# Patient Record
Sex: Male | Born: 1955 | Race: Black or African American | Hispanic: No | Smoking: Never smoker
Health system: Southern US, Community
[De-identification: ages and names within clinical notes are randomized; demographics above are authoritative.]

## PROBLEM LIST (undated history)

## (undated) DIAGNOSIS — E119 Type 2 diabetes mellitus without complications: Secondary | ICD-10-CM

## (undated) DIAGNOSIS — C9110 Chronic lymphocytic leukemia of B-cell type not having achieved remission: Secondary | ICD-10-CM

## (undated) DIAGNOSIS — F1011 Alcohol abuse, in remission: Secondary | ICD-10-CM

## (undated) DIAGNOSIS — H269 Unspecified cataract: Secondary | ICD-10-CM

## (undated) DIAGNOSIS — K7030 Alcoholic cirrhosis of liver without ascites: Secondary | ICD-10-CM

## (undated) DIAGNOSIS — E7800 Pure hypercholesterolemia, unspecified: Secondary | ICD-10-CM

## (undated) HISTORY — DX: Pure hypercholesterolemia, unspecified: E78.00

## (undated) HISTORY — DX: Alcohol abuse, in remission: F10.11

## (undated) HISTORY — DX: Type 2 diabetes mellitus without complications: E11.9

## (undated) HISTORY — DX: Alcoholic cirrhosis of liver without ascites: K70.30

## (undated) HISTORY — DX: Unspecified cataract: H26.9

---

## 2004-10-24 DIAGNOSIS — E139 Other specified diabetes mellitus without complications: Secondary | ICD-10-CM | POA: Diagnosis present

## 2004-10-24 DIAGNOSIS — I10 Essential (primary) hypertension: Secondary | ICD-10-CM

## 2004-10-24 HISTORY — DX: Essential (primary) hypertension: I10

## 2007-03-19 ENCOUNTER — Ambulatory Visit (HOSPITAL_COMMUNITY): Admission: RE | Admit: 2007-03-19 | Discharge: 2007-03-19 | Payer: Self-pay | Admitting: Gastroenterology

## 2013-12-06 ENCOUNTER — Other Ambulatory Visit: Payer: Self-pay | Admitting: Family Medicine

## 2013-12-06 ENCOUNTER — Ambulatory Visit
Admission: RE | Admit: 2013-12-06 | Discharge: 2013-12-06 | Disposition: A | Discharge status: Home / Self Care | Payer: PRIVATE HEALTH INSURANCE | Source: Ambulatory Visit | Attending: Family Medicine | Admitting: Family Medicine

## 2013-12-06 DIAGNOSIS — R05 Cough: Secondary | ICD-10-CM

## 2013-12-06 DIAGNOSIS — R059 Cough, unspecified: Secondary | ICD-10-CM

## 2016-05-13 ENCOUNTER — Ambulatory Visit (INDEPENDENT_AMBULATORY_CARE_PROVIDER_SITE_OTHER): Payer: PRIVATE HEALTH INSURANCE | Admitting: Podiatry

## 2016-05-13 ENCOUNTER — Encounter: Payer: Self-pay | Admitting: Podiatry

## 2016-05-13 DIAGNOSIS — M79674 Pain in right toe(s): Secondary | ICD-10-CM

## 2016-05-13 DIAGNOSIS — B351 Tinea unguium: Secondary | ICD-10-CM

## 2016-05-13 DIAGNOSIS — E0841 Diabetes mellitus due to underlying condition with diabetic mononeuropathy: Secondary | ICD-10-CM | POA: Diagnosis not present

## 2016-05-13 DIAGNOSIS — L84 Corns and callosities: Secondary | ICD-10-CM | POA: Diagnosis not present

## 2016-05-13 DIAGNOSIS — M79675 Pain in left toe(s): Secondary | ICD-10-CM

## 2016-05-13 NOTE — Progress Notes (Signed)
   Subjective:    Patient ID: Cody [name], male    DOB: [birth year], 60 y.o.   MRN: [id number]  HPI  This patient presents today with his sister in the treatment room and is requesting trimming of thickened and elongated toenails that are gradually becoming more uncomfortable over the past 4-6 months making shoe wearing walking uncomfortable. Apparently patient's had previous podiatric care, however, not in the last several years and there is been some self treatment by family members. Patient is a diabetic and denies history of foot ulceration, claudication, amputation   Review of Systems  HENT: Positive for sinus pressure.   Psychiatric/Behavioral:       BRAIN DAMAGE       Objective:   Physical Exam  Patient can respond to direct questioning  Vascular: Mild pitting edema ankles and feet bilaterally No calf pain or calf tenderness bilaterally DP and PT pulses 1/4 bilaterally Capillary reflex immediate bilaterally  Neurological: Sensation to 10 g monofilament wire intact 4/5 right 2/4 left Vibratory sensation nonreactive bilaterally Ankle reflex equal and reactive bilaterally  Dermatological: No open skin lesions bilaterally Plantar callus fifth left MPJ The toenails are elongated, brittle, deformed, discolored and tender to direct palpation 6-10  Musculoskeletal: Webbing toes 23 bilaterally Overlapping fifth left toe There is no restriction ankle, subtalar, midtarsal joints bilaterally      Assessment & Plan:   Assessment: Decrease pedal pulses bilaterally Decreased sensation bilaterally, peripheral neuropathy Diabetic Neglected symptomatic onychomycoses 6-10 Plantar callus 1  Plan: Reviewed the results of exam with patient and sister present to treatment room and recommend periodic nail debridement. The toenails 6-10 are debrided mechanically and electrically without any bleeding Debride plantar callus fifth MPJ left without any bleeding  Reappoint  3 months

## 2016-05-13 NOTE — Patient Instructions (Signed)
Diabetes and Foot Care Diabetes may cause you to have problems because of poor blood supply (circulation) to your feet and legs. This may cause the skin on your feet to become thinner, break easier, and heal more slowly. Your skin may become dry, and the skin may peel and crack. You may also have nerve damage in your legs and feet causing decreased feeling in them. You may not notice minor injuries to your feet that could lead to infections or more serious problems. Taking care of your feet is one of the most important things you can do for yourself.  HOME CARE INSTRUCTIONS  Wear shoes at all times, even in the house. Do not go barefoot. Bare feet are easily injured.  Check your feet daily for blisters, cuts, and redness. If you cannot see the bottom of your feet, use a mirror or ask someone for help.  Wash your feet with warm water (do not use hot water) and mild soap. Then pat your feet and the areas between your toes until they are completely dry. Do not soak your feet as this can dry your skin.  Apply a moisturizing lotion or petroleum jelly (that does not contain alcohol and is unscented) to the skin on your feet and to dry, brittle toenails. Do not apply lotion between your toes.  Trim your toenails straight across. Do not dig under them or around the cuticle. File the edges of your nails with an emery board or nail file.  Do not cut corns or calluses or try to remove them with medicine.  Wear clean socks or stockings every day. Make sure they are not too tight. Do not wear knee-high stockings since they may decrease blood flow to your legs.  Wear shoes that fit properly and have enough cushioning. To break in new shoes, wear them for just a few hours a day. This prevents you from injuring your feet. Always look in your shoes before you put them on to be sure there are no objects inside.  Do not cross your legs. This may decrease the blood flow to your feet.  If you find a minor scrape,  cut, or break in the skin on your feet, keep it and the skin around it clean and dry. These areas may be cleansed with mild soap and water. Do not cleanse the area with peroxide, alcohol, or iodine.  When you remove an adhesive bandage, be sure not to damage the skin around it.  If you have a wound, look at it several times a day to make sure it is healing.  Do not use heating pads or hot water bottles. They may burn your skin. If you have lost feeling in your feet or legs, you may not know it is happening until it is too late.  Make sure your health care provider performs a complete foot exam at least annually or more often if you have foot problems. Report any cuts, sores, or bruises to your health care provider immediately. SEEK MEDICAL CARE IF:   You have an injury that is not healing.  You have cuts or breaks in the skin.  You have an ingrown nail.  You notice redness on your legs or feet.  You feel burning or tingling in your legs or feet.  You have pain or cramps in your legs and feet.  Your legs or feet are numb.  Your feet always feel cold. SEEK IMMEDIATE MEDICAL CARE IF:   There is increasing redness,   swelling, or pain in or around a wound.  There is a red line that goes up your leg.  Pus is coming from a wound.  You develop a fever or as directed by your health care provider.  You notice a bad smell coming from an ulcer or wound.   This information is not intended to replace advice given to you by your health care provider. Make sure you discuss any questions you have with your health care provider.   Document Released: 11/07/2000 Document Revised: 07/13/2013 Document Reviewed: 04/19/2013 Elsevier Interactive Patient Education 2016 [employer]  

## 2016-08-19 ENCOUNTER — Encounter (INDEPENDENT_AMBULATORY_CARE_PROVIDER_SITE_OTHER): Payer: PRIVATE HEALTH INSURANCE | Admitting: Podiatry

## 2016-08-19 NOTE — Progress Notes (Signed)
This encounter was created in error - please disregard.

## 2016-08-27 ENCOUNTER — Ambulatory Visit (INDEPENDENT_AMBULATORY_CARE_PROVIDER_SITE_OTHER): Payer: PRIVATE HEALTH INSURANCE | Admitting: Podiatry

## 2016-08-27 ENCOUNTER — Encounter: Payer: Self-pay | Admitting: Podiatry

## 2016-08-27 VITALS — BP 144/89 | HR 75 | Resp 18

## 2016-08-27 DIAGNOSIS — M79674 Pain in right toe(s): Secondary | ICD-10-CM

## 2016-08-27 DIAGNOSIS — E0849 Diabetes mellitus due to underlying condition with other diabetic neurological complication: Secondary | ICD-10-CM

## 2016-08-27 DIAGNOSIS — Q828 Other specified congenital malformations of skin: Secondary | ICD-10-CM | POA: Diagnosis not present

## 2016-08-27 DIAGNOSIS — M79675 Pain in left toe(s): Secondary | ICD-10-CM | POA: Diagnosis not present

## 2016-08-27 DIAGNOSIS — B351 Tinea unguium: Secondary | ICD-10-CM | POA: Diagnosis not present

## 2016-08-27 NOTE — Patient Instructions (Signed)
Diabetes and Foot Care Diabetes may cause you to have problems because of poor blood supply (circulation) to your feet and legs. This may cause the skin on your feet to become thinner, break easier, and heal more slowly. Your skin may become dry, and the skin may peel and crack. You may also have nerve damage in your legs and feet causing decreased feeling in them. You may not notice minor injuries to your feet that could lead to infections or more serious problems. Taking care of your feet is one of the most important things you can do for yourself.  HOME CARE INSTRUCTIONS  Wear shoes at all times, even in the house. Do not go barefoot. Bare feet are easily injured.  Check your feet daily for blisters, cuts, and redness. If you cannot see the bottom of your feet, use a mirror or ask someone for help.  Wash your feet with warm water (do not use hot water) and mild soap. Then pat your feet and the areas between your toes until they are completely dry. Do not soak your feet as this can dry your skin.  Apply a moisturizing lotion or petroleum jelly (that does not contain alcohol and is unscented) to the skin on your feet and to dry, brittle toenails. Do not apply lotion between your toes.  Trim your toenails straight across. Do not dig under them or around the cuticle. File the edges of your nails with an emery board or nail file.  Do not cut corns or calluses or try to remove them with medicine.  Wear clean socks or stockings every day. Make sure they are not too tight. Do not wear knee-high stockings since they may decrease blood flow to your legs.  Wear shoes that fit properly and have enough cushioning. To break in new shoes, wear them for just a few hours a day. This prevents you from injuring your feet. Always look in your shoes before you put them on to be sure there are no objects inside.  Do not cross your legs. This may decrease the blood flow to your feet.  If you find a minor scrape,  cut, or break in the skin on your feet, keep it and the skin around it clean and dry. These areas may be cleansed with mild soap and water. Do not cleanse the area with peroxide, alcohol, or iodine.  When you remove an adhesive bandage, be sure not to damage the skin around it.  If you have a wound, look at it several times a day to make sure it is healing.  Do not use heating pads or hot water bottles. They may burn your skin. If you have lost feeling in your feet or legs, you may not know it is happening until it is too late.  Make sure your health care provider performs a complete foot exam at least annually or more often if you have foot problems. Report any cuts, sores, or bruises to your health care provider immediately. SEEK MEDICAL CARE IF:   You have an injury that is not healing.  You have cuts or breaks in the skin.  You have an ingrown nail.  You notice redness on your legs or feet.  You feel burning or tingling in your legs or feet.  You have pain or cramps in your legs and feet.  Your legs or feet are numb.  Your feet always feel cold. SEEK IMMEDIATE MEDICAL CARE IF:   There is increasing redness,   swelling, or pain in or around a wound.  There is a red line that goes up your leg.  Pus is coming from a wound.  You develop a fever or as directed by your health care provider.  You notice a bad smell coming from an ulcer or wound.   This information is not intended to replace advice given to you by your health care provider. Make sure you discuss any questions you have with your health care provider.   Document Released: 11/07/2000 Document Revised: 07/13/2013 Document Reviewed: 04/19/2013 Elsevier Interactive Patient Education 2016 [employer]  

## 2016-08-28 NOTE — Progress Notes (Signed)
Patient ID: Cody [name], male   DOB: Aug 15, 1956, 60 y.o.   MRN: [id number]   Subjective: This patient presents for a scheduled visit complaining of uncomfortable toenails and walking wearing shoes and request toenail debridement Patient is a diabetic and denies history of foot ulceration, claudication, amputation   Objective: Patient does respond to direct questioning  Vascular: Mild pitting edema ankles and feet bilaterally No calf pain or calf tenderness bilaterally DP and PT pulses 1/4 bilaterally Capillary reflex immediate bilaterally  Neurological: Sensation to 10 g monofilament wire intact 4/5 right 2/4 left Vibratory sensation nonreactive bilaterally Ankle reflex equal and reactive bilaterally  Dermatological: No open skin lesions bilaterally Plantar callus fifth left MPJ The toenails are elongated, brittle, deformed, discolored and tender to direct palpation 6-10  Musculoskeletal: Webbing toes 23 bilaterally Overlapping fifth left toe There is no restriction ankle, subtalar, midtarsal joints bilaterally  Assessment: Decrease pedal pulses bilaterally Decreased sensation bilaterally, peripheral neuropathy Diabetic Neglected symptomatic onychomycoses 6-10 Plantar callus 1  Plan: Reviewed the results of exam with patient and sister present to treatment room and recommend periodic nail debridement. The toenails 6-10 are debrided mechanically and electrically without any bleeding Debride plantar callus fifth MPJ left without any bleeding  Reappoint 3 months

## 2016-12-03 ENCOUNTER — Encounter: Payer: Self-pay | Admitting: Podiatry

## 2016-12-03 ENCOUNTER — Ambulatory Visit (INDEPENDENT_AMBULATORY_CARE_PROVIDER_SITE_OTHER): Payer: PRIVATE HEALTH INSURANCE | Admitting: Podiatry

## 2016-12-03 VITALS — BP 131/72 | HR 80 | Resp 18

## 2016-12-03 DIAGNOSIS — M79674 Pain in right toe(s): Secondary | ICD-10-CM

## 2016-12-03 DIAGNOSIS — M79675 Pain in left toe(s): Secondary | ICD-10-CM

## 2016-12-03 DIAGNOSIS — E0849 Diabetes mellitus due to underlying condition with other diabetic neurological complication: Secondary | ICD-10-CM

## 2016-12-03 DIAGNOSIS — L84 Corns and callosities: Secondary | ICD-10-CM | POA: Diagnosis not present

## 2016-12-03 DIAGNOSIS — B351 Tinea unguium: Secondary | ICD-10-CM | POA: Diagnosis not present

## 2016-12-03 NOTE — Progress Notes (Signed)
Patient ID: ESIAH COFFELL, male   DOB: [birth year], 61 y.o.   MRN: [id number]    Subjective: This patient presents for a scheduled visit complaining of uncomfortable toenails and walking wearing shoes and request toenail debridement Patient is a diabetic and denies history of foot ulceration, claudication, amputation The patient's sister is present in the treatment room who is his co-guardian.  Objective: Patient does respond to direct questioning  Vascular: Mild pitting edema ankles and feet bilaterally No calf pain or calf tenderness bilaterally DP and PT pulses 1/4 bilaterally Capillary reflex immediate bilaterally  Neurological: Sensation to 10 g monofilament wire intact 4/5 right 2/4 left Vibratory sensation nonreactive bilaterally Ankle reflex equal and reactive bilaterally  Dermatological: No open skin lesions bilaterally Plantar callus fifth left MPJ The toenails are elongated, brittle, deformed, discolored and tender to direct palpation 6-10  Musculoskeletal: Webbing toes 23 bilaterally Overlapping fifth left toe There is no restriction ankle, subtalar, midtarsal joints bilaterally  Assessment: Decrease pedal pulses bilaterally Decreased sensation bilaterally, peripheral neuropathy Diabetic Neglected symptomatic onychomycoses 6-10 Plantar callus 1  Plan: Reviewed the results of exam with patient and sister present to treatment room and recommend periodic nail debridement. The toenails 6-10 are debrided mechanically and electrically without any bleeding Debride plantar callus fifth MPJ left without any bleeding  Reappoint 3 months

## 2016-12-03 NOTE — Patient Instructions (Signed)

## 2017-03-03 ENCOUNTER — Encounter (INDEPENDENT_AMBULATORY_CARE_PROVIDER_SITE_OTHER): Payer: PRIVATE HEALTH INSURANCE | Admitting: Podiatry

## 2017-03-03 NOTE — Progress Notes (Signed)
This encounter was created in error - please disregard.

## 2017-12-17 ENCOUNTER — Ambulatory Visit (INDEPENDENT_AMBULATORY_CARE_PROVIDER_SITE_OTHER): Payer: PRIVATE HEALTH INSURANCE | Admitting: Podiatry

## 2017-12-17 DIAGNOSIS — B351 Tinea unguium: Secondary | ICD-10-CM | POA: Diagnosis not present

## 2017-12-17 DIAGNOSIS — E1151 Type 2 diabetes mellitus with diabetic peripheral angiopathy without gangrene: Secondary | ICD-10-CM

## 2017-12-24 NOTE — Progress Notes (Signed)
  Subjective:  Patient ID: Cody [name], male    DOB: May 15, 1956,  MRN: [id number]  Chief Complaint  Patient presents with  . Nail Problem    3 month nail trim/ [facility] patient   62 y.o. male returns for diabetic foot care. Last AMBS was unknown. Denies numbness and tingling in their feet. Denies cramping in legs and thighs.  Objective:   General AA&O x3. Normal mood and affect.  Vascular Dorsalis pedis pulses present 1+ bilaterally  Posterior tibial pulses absent bilaterally  Capillary refill normal to all digits. Pedal hair growth normal.  Neurologic Epicritic sensation present bilaterally. Protective sensation with 5.07 monofilament  present bilaterally. Vibratory sensation present bilaterally.  Dermatologic No open lesions. Interspaces clear of maceration.  Normal skin temperature and turgor. Hyperkeratotic lesions: None bilaterally. Nails: brittle, onychomycosis, thickening, elongation  Orthopedic: No history of amputation. MMT 5/5 in dorsiflexion, plantarflexion, inversion, and eversion. Normal lower extremity joint ROM without pain or crepitus.   Assessment & Plan:  Patient was evaluated and treated and all questions answered.  Diabetes with PAD, Onychomycosis -Educated on diabetic footcare. Diabetic risk level 1 -At risk foot care provided as below.  Procedure: Nail Debridement Rationale: Patient meets criteria for routine foot care due to class B findings. Type of Debridement: manual, sharp debridement. Instrumentation: Nail nipper, rotary burr. Number of Nails: 10  Return in about 3 months (around 03/17/2018).

## 2018-03-18 ENCOUNTER — Ambulatory Visit: Payer: PRIVATE HEALTH INSURANCE | Admitting: Podiatry

## 2018-11-04 ENCOUNTER — Emergency Department
Admission: EM | Admit: 2018-11-04 | Discharge: 2018-11-04 | Disposition: A | Discharge status: Home / Self Care | Payer: 59 | Attending: Emergency Medicine | Admitting: Emergency Medicine

## 2018-11-04 ENCOUNTER — Other Ambulatory Visit: Payer: Self-pay

## 2018-11-04 ENCOUNTER — Encounter: Payer: Self-pay | Admitting: *Deleted

## 2018-11-04 DIAGNOSIS — C9110 Chronic lymphocytic leukemia of B-cell type not having achieved remission: Secondary | ICD-10-CM | POA: Diagnosis not present

## 2018-11-04 DIAGNOSIS — E119 Type 2 diabetes mellitus without complications: Secondary | ICD-10-CM | POA: Diagnosis not present

## 2018-11-04 DIAGNOSIS — D72829 Elevated white blood cell count, unspecified: Secondary | ICD-10-CM | POA: Diagnosis present

## 2018-11-04 LAB — CBC
HCT: 41.4 % (ref 39.0–52.0)
Hemoglobin: 12 g/dL — ABNORMAL LOW (ref 13.0–17.0)
MCH: 25.9 pg — ABNORMAL LOW (ref 26.0–34.0)
MCHC: 29 g/dL — ABNORMAL LOW (ref 30.0–36.0)
MCV: 89.2 fL (ref 80.0–100.0)
Platelets: 157 10*3/uL (ref 150–400)
RBC: 4.64 MIL/uL (ref 4.22–5.81)
RDW: 14.6 % (ref 11.5–15.5)
WBC: 155.5 10*3/uL (ref 4.0–10.5)
nRBC: 0 % (ref 0.0–0.2)

## 2018-11-04 LAB — CBC WITH DIFFERENTIAL/PLATELET
Abs Immature Granulocytes: 0.4 10*3/uL — ABNORMAL HIGH (ref 0.00–0.07)
Basophils Absolute: 0.2 10*3/uL — ABNORMAL HIGH (ref 0.0–0.1)
Basophils Relative: 0 %
Eosinophils Absolute: 0.1 10*3/uL (ref 0.0–0.5)
Eosinophils Relative: 0 %
HCT: 41.6 % (ref 39.0–52.0)
Hemoglobin: 12 g/dL — ABNORMAL LOW (ref 13.0–17.0)
Immature Granulocytes: 0 %
Lymphocytes Relative: 95 %
Lymphs Abs: 155.2 10*3/uL — ABNORMAL HIGH (ref 0.7–4.0)
MCH: 26 pg (ref 26.0–34.0)
MCHC: 28.8 g/dL — ABNORMAL LOW (ref 30.0–36.0)
MCV: 90 fL (ref 80.0–100.0)
Monocytes Absolute: 2 10*3/uL — ABNORMAL HIGH (ref 0.1–1.0)
Monocytes Relative: 1 %
Neutro Abs: 6.5 10*3/uL (ref 1.7–7.7)
Neutrophils Relative %: 4 %
Platelets: 158 10*3/uL (ref 150–400)
RBC: 4.62 MIL/uL (ref 4.22–5.81)
RDW: 14.6 % (ref 11.5–15.5)
WBC: 164.4 10*3/uL (ref 4.0–10.5)
nRBC: 0 % (ref 0.0–0.2)

## 2018-11-04 LAB — BASIC METABOLIC PANEL
Anion gap: 7 (ref 5–15)
BUN: 22 mg/dL (ref 8–23)
CO2: 28 mmol/L (ref 22–32)
Calcium: 9.1 mg/dL (ref 8.9–10.3)
Chloride: 101 mmol/L (ref 98–111)
Creatinine, Ser: 1.55 mg/dL — ABNORMAL HIGH (ref 0.61–1.24)
GFR calc Af Amer: 54 mL/min — ABNORMAL LOW (ref >60)
GFR calc non Af Amer: 47 mL/min — ABNORMAL LOW (ref >60)
Glucose, Bld: 271 mg/dL — ABNORMAL HIGH (ref 70–99)
Potassium: 4.8 mmol/L (ref 3.5–5.1)
Sodium: 136 mmol/L (ref 135–145)

## 2018-11-04 LAB — TROPONIN I: Troponin I: <0.03 ng/mL (ref <0.03)

## 2018-11-04 NOTE — ED Provider Notes (Signed)
[facility] Emergency Department Provider Note       Time seen: ----------------------------------------- 11:03 PM on 11/04/2018 -----------------------------------------   I have reviewed the triage vital signs and the nursing notes.  HISTORY   Chief Complaint Abnormal Lab    HPI Cody [name] is a 62 y.o. male with a history of diabetes who presents to the ED for possible leukemia.  Patient was called by his doctor today with abnormal labs told to come to the ER for evaluation of possible leukemia.  He denies fevers, chills, chest pain, shortness of breath, vomiting or diarrhea.  Past Medical History:  Diagnosis Date  . Diabetes mellitus without complication ([place])     There are no active problems to display for this patient.   No past surgical history on file.  Allergies Patient has no known allergies.  Social History Social History   Tobacco Use  . Smoking status: Never Smoker  . Smokeless tobacco: Never Used  Substance Use Topics  . Alcohol use: Not Currently  . Drug use: No   Review of Systems Constitutional: Negative for fever. Cardiovascular: Negative for chest pain. Respiratory: Negative for shortness of breath. Gastrointestinal: Negative for abdominal pain, vomiting and diarrhea. Musculoskeletal: Negative for back pain. Skin: Negative for rash. Neurological: Negative for headaches, focal weakness or numbness.  All systems negative/normal/unremarkable except as stated in the HPI  ____________________________________________   PHYSICAL EXAM:  VITAL SIGNS: ED Triage Vitals  Enc Vitals Group     BP 11/04/18 2020 (!) 189/100     Pulse Rate 11/04/18 2020 87     Resp 11/04/18 2020 20     Temp 11/04/18 2020 98.4 F (36.9 C)     Temp Source 11/04/18 2020 Oral     SpO2 11/04/18 2020 97 %     Weight 11/04/18 2023 232 lb (105.2 kg)     Height 11/04/18 2023 6' (1.829 m)     Head Circumference --      Peak Flow --      Pain Score 11/04/18 2023 0     Pain Loc --      Pain Edu? --      Excl. in [place]? --    Constitutional: Alert and oriented. Well appearing and in no distress. Cardiovascular: Normal rate, regular rhythm. No murmurs, rubs, or gallops. Respiratory: Normal respiratory effort without tachypnea nor retractions. Breath sounds are clear and equal bilaterally. No wheezes/rales/rhonchi. Gastrointestinal: Soft and nontender. Normal bowel sounds Musculoskeletal: Nontender with normal range of motion in extremities. No lower extremity tenderness nor edema. Neurologic:  Normal speech and language. No gross focal neurologic deficits are appreciated.  Skin:  Skin is warm, dry and intact. No rash noted. Psychiatric: Mood and affect are normal. Speech and behavior are normal.  ____________________________________________  ED COURSE:  As part of my medical decision making, I reviewed the following data within the [place] History obtained from family if available, nursing notes, old chart and ekg, as well as notes from prior ED visits. Patient presented for possible leukemia, we will assess with labs as indicated at this time. Clinical Course as of Nov 04 2302  Thu Nov 04, 2018  2252 I discussed with pathology, no blasts are present.  I will discuss with medical oncology.   [JW]    Clinical Course User Index [JW] Earleen Newport, MD   Procedures ____________________________________________   LABS (pertinent positives/negatives)  Labs Reviewed  BASIC METABOLIC PANEL - Abnormal; Notable for the  following components:      Result Value   Glucose, Bld 271 (*)    Creatinine, Ser 1.55 (*)    GFR calc non Af Amer 47 (*)    GFR calc Af Amer 54 (*)    All other components within normal limits  CBC - Abnormal; Notable for the following components:   WBC 155.5 (*)    Hemoglobin 12.0 (*)    MCH 25.9 (*)    MCHC 29.0 (*)    All other components within normal limits  CBC WITH  DIFFERENTIAL/PLATELET - Abnormal; Notable for the following components:   WBC 164.4 (*)    Hemoglobin 12.0 (*)    MCHC 28.8 (*)    Lymphs Abs 155.2 (*)    Monocytes Absolute 2.0 (*)    Basophils Absolute 0.2 (*)    Abs Immature Granulocytes 0.40 (*)    All other components within normal limits  TROPONIN I  PATHOLOGIST SMEAR REVIEW   ____________________________________________  DIFFERENTIAL DIAGNOSIS   Acute leukemia, chronic leukemia, sepsis unlikely, C. difficile unlikely  FINAL ASSESSMENT AND PLAN  CLL   Plan: The patient had presented for elevated white blood cell count. 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  I have discussed with oncology on-call in [place], he will follow-up with an oncologist in [place] tomorrow at 3 PM.   Laurence Aly, MD   Note: This note was generated in part or whole with voice recognition software. Voice recognition is usually quite accurate but there are transcription errors that can and very often do occur. I apologize for any typographical errors that were not detected and corrected.     Earleen Newport, MD 11/04/18 [id number]

## 2018-11-04 NOTE — ED Notes (Signed)
Family verbalized understanding of discharge instructions, no questions. Patient ambulated out of ED with steady gait in no distress.

## 2018-11-04 NOTE — ED Triage Notes (Signed)
Pt ambulatory to triage.  Pt was called today by his doctor with abnormal labs and told to come to er for eval of leukemia.  Pt alert.

## 2018-11-05 ENCOUNTER — Telehealth: Payer: Self-pay | Admitting: Oncology

## 2018-11-05 ENCOUNTER — Other Ambulatory Visit: Payer: Self-pay | Admitting: Oncology

## 2018-11-05 ENCOUNTER — Other Ambulatory Visit: Payer: Self-pay | Admitting: *Deleted

## 2018-11-05 ENCOUNTER — Inpatient Hospital Stay (HOSPITAL_BASED_OUTPATIENT_CLINIC_OR_DEPARTMENT_OTHER): Payer: 59 | Admitting: Oncology

## 2018-11-05 ENCOUNTER — Inpatient Hospital Stay: Payer: 59 | Attending: Oncology

## 2018-11-05 DIAGNOSIS — Z79899 Other long term (current) drug therapy: Secondary | ICD-10-CM | POA: Diagnosis not present

## 2018-11-05 DIAGNOSIS — D72829 Elevated white blood cell count, unspecified: Secondary | ICD-10-CM

## 2018-11-05 DIAGNOSIS — Z5112 Encounter for antineoplastic immunotherapy: Secondary | ICD-10-CM | POA: Diagnosis not present

## 2018-11-05 DIAGNOSIS — C9110 Chronic lymphocytic leukemia of B-cell type not having achieved remission: Secondary | ICD-10-CM | POA: Diagnosis present

## 2018-11-05 DIAGNOSIS — Z7189 Other specified counseling: Secondary | ICD-10-CM | POA: Insufficient documentation

## 2018-11-05 LAB — CBC WITH DIFFERENTIAL (CANCER CENTER ONLY)
Abs Immature Granulocytes: 0.32 10*3/uL — ABNORMAL HIGH (ref 0.00–0.07)
Basophils Absolute: 0.1 10*3/uL (ref 0.0–0.1)
Basophils Relative: 0 %
Eosinophils Absolute: 0.1 10*3/uL (ref 0.0–0.5)
Eosinophils Relative: 0 %
HCT: 40.8 % (ref 39.0–52.0)
Hemoglobin: 11.8 g/dL — ABNORMAL LOW (ref 13.0–17.0)
Immature Granulocytes: 0 %
Lymphocytes Relative: 95 %
Lymphs Abs: 146.5 10*3/uL — ABNORMAL HIGH (ref 0.7–4.0)
MCH: 26.1 pg (ref 26.0–34.0)
MCHC: 28.9 g/dL — ABNORMAL LOW (ref 30.0–36.0)
MCV: 90.3 fL (ref 80.0–100.0)
Monocytes Absolute: 2.2 10*3/uL — ABNORMAL HIGH (ref 0.1–1.0)
Monocytes Relative: 1 %
Neutro Abs: 5.9 10*3/uL (ref 1.7–7.7)
Neutrophils Relative %: 4 %
Platelet Count: 146 10*3/uL — ABNORMAL LOW (ref 150–400)
RBC: 4.52 MIL/uL (ref 4.22–5.81)
RDW: 14.6 % (ref 11.5–15.5)
WBC Count: 155.1 10*3/uL (ref 4.0–10.5)
nRBC: 0 % (ref 0.0–0.2)

## 2018-11-05 LAB — PATHOLOGIST SMEAR REVIEW

## 2018-11-05 LAB — LACTATE DEHYDROGENASE: LDH: 182 U/L (ref 98–192)

## 2018-11-05 LAB — SAVE SMEAR(SSMR), FOR PROVIDER SLIDE REVIEW

## 2018-11-05 MED ORDER — ALLOPURINOL 300 MG PO TABS: 300.0000 mg | ORAL_TABLET | Freq: Every day | ORAL | 4 refills | Status: DC

## 2018-11-05 NOTE — Telephone Encounter (Signed)
I received an email from Dr. Jana Hakim to schedule [name] to see him today at 330pm w/labs at 3pm. I attempted to call the pt, but his vm picked. I lft the appt information on the pt's vm and asked that he callback to confirm.

## 2018-11-05 NOTE — Progress Notes (Signed)
[place]  [phone] [phone]     ID: CHRISEAN KLOTH DOB: 1956-11-24  MR#: [id number]  FXO#:[id number]  Patient Care Team: [name], MD as PCP - General (Family Medicine) Chauncey Cruel, MD OTHER MD:  CHIEF COMPLAINT: Chronic lymphoid leukemia  CURRENT TREATMENT: Rituximab   HISTORY OF CURRENT ILLNESS: Mr. Bazar was evaluated by his primary care physician and found to have a very large white cell count.  He was asked to go to the emergency room which the patient did on 11/04/2018 at 8 PM.  The patient was found to be stable, with a white cell count of greater than 150,000, most of which were lymphocytes.  A pathology smear review by Dr. Reuel Derby  showed lymphocytosis and smudge cells.  There were no blasts.  RBCs and platelets were unremarkable.  The patient was referred to our clinic for further evaluation  Mr. Gaby subsequent history is as detailed below.  INTERVAL HISTORY: "[address]!  Was evaluated in the hematology clinic 11/05/2018 accompanied by his sister [place].  REVIEW OF SYSTEMS: There have been no "B" symptoms and particularly the patient denies unexplained fevers, drenching night sweats, significant weight loss, significant fatigue, pruritus, or adenopathy.  The patient denies unusual headaches, visual changes (though he does say he needs new glasses), nausea, vomiting, stiff neck, dizziness, or gait imbalance. There has been no cough, phlegm production, or pleurisy, no chest pain or pressure, and no change in bowel or bladder habits. A detailed review of systems was otherwise entirely negative.   PAST MEDICAL HISTORY: Past Medical History:  Diagnosis Date  . Diabetes mellitus without complication ([place])   Hypertension, hypercholesterolemia, cataracts, seasonal allergies, cirrhosis, history of EtOH abuse  PAST SURGICAL HISTORY: No past surgical history on file. Surgery for incarcerated bowel  release  FAMILY HISTORY No family history on file.  The patient's father died from cirrhosis at age 63 in the setting of alcohol abuse; he also had significant coronary artery disease.  The patient's mother is living, 63 years old as of December 2019.  The patient has 2 sisters, no brothers.  They are not aware of any cancer history in the family  SOCIAL HISTORY:  The patient used to work in [employer] for the city of [place].  He was briefly married but the marriage has been annulled.  The patient currently lives with his sister Orin Eberwein and their mother.  A second sister, Charma Igo, lives in [place].    ADVANCED DIRECTIVES: The patient Sister [name] is his healthcare power of attorney.  She can be reached at [phone].   HEALTH MAINTENANCE: Social History   Tobacco Use  . Smoking status: Never Smoker  . Smokeless tobacco: Never Used  Substance Use Topics  . Alcohol use: Not Currently  . Drug use: No     Colonoscopy:  PSA:  Bone density:   No Known Allergies  Current Outpatient Medications  Medication Sig Dispense Refill  . amLODipine (NORVASC) 10 MG tablet Take 10 mg by mouth daily.    Marland Kitchen atorvastatin (LIPITOR) 10 MG tablet Take 10 mg by mouth daily.    . Insulin Glargine, 2 Unit Dial, (TOUJEO MAX SOLOSTAR) 300 UNIT/ML SOPN Inject into the skin.    Marland Kitchen JENTADUETO 2.03-999 MG TABS TAKE 1 TABLET BY MOUTH EVERY DAY WITH SUPPER  3  . metFORMIN (GLUCOPHAGE) 1000 MG tablet     . ONETOUCH DELICA LANCETS FINE MISC USE TO TEST BLOOD SUGAR UP TO THREE TIMES  DAILY  5  . telmisartan-hydrochlorothiazide (MICARDIS HCT) 80-25 MG tablet Take 1 tablet by mouth daily.    Marland Kitchen allopurinol (ZYLOPRIM) 300 MG tablet Take 1 tablet (300 mg total) by mouth daily. 90 tablet 4   No current facility-administered medications for this visit.     OBJECTIVE: Middle-aged African-American man in no acute distress  Vitals:   11/05/18 1544  BP: (!) 169/88  Pulse: 89  Resp: 18   Temp: 97.6 F (36.4 C)  SpO2: 98%     Body mass index is 31.02 kg/m.   Wt Readings from Last 3 Encounters:  11/05/18 228 lb 11.2 oz (103.7 kg)  11/04/18 232 lb (105.2 kg)      ECOG FS:0 - Asymptomatic  Ocular: Sclerae unicteric, left exotropia Ear-nose-throat: Oropharynx clear and moist Lymphatic: No cervical or supraclavicular adenopathy, no axillary adenopathy Lungs no rales or rhonchi Heart regular rate and rhythm Abd soft, nontender, positive bowel sounds, no palpable splenomegaly MSK no focal spinal tenderness, no joint edema Neuro: non-focal, appears socially well oriented but orientation was not formally tested.  Pleasant and cooperative affect    LAB RESULTS:  CMP     Component Value Date/Time   NA 136 11/04/2018 2026   K 4.8 11/04/2018 2026   CL 101 11/04/2018 2026   CO2 28 11/04/2018 2026   GLUCOSE 271 (H) 11/04/2018 2026   BUN 22 11/04/2018 2026   CREATININE 1.55 (H) 11/04/2018 2026   CALCIUM 9.1 11/04/2018 2026   GFRNONAA 47 (L) 11/04/2018 2026   GFRAA 54 (L) 11/04/2018 2026    No results found for: TOTALPROTELP, ALBUMINELP, A1GS, A2GS, BETS, BETA2SER, GAMS, MSPIKE, SPEI  No results found for: Nils Pyle, [facility]  Lab Results  Component Value Date   WBC 155.1 (HH) 11/05/2018   NEUTROABS 5.9 11/05/2018   HGB 11.8 (L) 11/05/2018   HCT 40.8 11/05/2018   MCV 90.3 11/05/2018   PLT 146 (L) 11/05/2018      Chemistry      Component Value Date/Time   NA 136 11/04/2018 2026   K 4.8 11/04/2018 2026   CL 101 11/04/2018 2026   CO2 28 11/04/2018 2026   BUN 22 11/04/2018 2026   CREATININE 1.55 (H) 11/04/2018 2026      Component Value Date/Time   CALCIUM 9.1 11/04/2018 2026       No results found for: LABCA2  No components found for: [id number]  No results for input(s): INR in the last 168 hours.  No results found for: LABCA2  No results found for: EUM353  No results found for: IRW431  No results found for: VQM086  No  results found for: CA2729  No components found for: HGQUANT  No results found for: CEA1 / No results found for: CEA1   No results found for: AFPTUMOR  No results found for: CHROMOGRNA  No results found for: PSA1  Appointment on 11/05/2018  Component Date Value Ref Range Status  . LDH 11/05/2018 182  98 - 192 U/L Final   Performed at [facility], [place] [address], [place], [place] [address]  . Smear Review 11/05/2018 SMEAR STAINED AND AVAILABLE FOR REVIEW   Final   Performed at [facility], [address] [place],  [address]  . WBC Count 11/05/2018 155.1* 4.0 - 10.5 K/uL Final   This critical result has verified and been called to VAL DODD by Orvis Brill on 12 13 2019 at 1528, and has been read back.   Marland Kitchen  RBC 11/05/2018 4.52  4.22 - 5.81 MIL/uL Final  . Hemoglobin 11/05/2018 11.8* 13.0 - 17.0 g/dL Final  . HCT 11/05/2018 40.8  39.0 - 52.0 % Final  . MCV 11/05/2018 90.3  80.0 - 100.0 fL Final  . MCH 11/05/2018 26.1  26.0 - 34.0 pg Final  . MCHC 11/05/2018 28.9* 30.0 - 36.0 g/dL Final  . RDW 11/05/2018 14.6  11.5 - 15.5 % Final  . Platelet Count 11/05/2018 146* 150 - 400 K/uL Final  . nRBC 11/05/2018 0.0  0.0 - 0.2 % Final  . Neutrophils Relative % 11/05/2018 4  % Final  . Neutro Abs 11/05/2018 5.9  1.7 - 7.7 K/uL Final  . Lymphocytes Relative 11/05/2018 95  % Final  . Lymphs Abs 11/05/2018 146.5* 0.7 - 4.0 K/uL Final  . Monocytes Relative 11/05/2018 1  % Final  . Monocytes Absolute 11/05/2018 2.2* 0.1 - 1.0 K/uL Final  . Eosinophils Relative 11/05/2018 0  % Final  . Eosinophils Absolute 11/05/2018 0.1  0.0 - 0.5 K/uL Final  . Basophils Relative 11/05/2018 0  % Final  . Basophils Absolute 11/05/2018 0.1  0.0 - 0.1 K/uL Final  . Immature Granulocytes 11/05/2018 0  % Final  . Abs Immature Granulocytes 11/05/2018 0.32* 0.00 - 0.07 K/uL Final  . Smudge Cells 11/05/2018 PRESENT   Final   Performed at [facility], [place] [address] [place], [place] [address]  Admission on 11/04/2018, Discharged on 11/04/2018  Component Date Value Ref Range Status  . Sodium 11/04/2018 136  135 - 145 mmol/L Final  . Potassium 11/04/2018 4.8  3.5 - 5.1 mmol/L Final  . Chloride 11/04/2018 101  98 - 111 mmol/L Final  . CO2 11/04/2018 28  22 - 32 mmol/L Final  . Glucose, Bld 11/04/2018 271* 70 - 99 mg/dL Final  . BUN 11/04/2018 22  8 - 23 mg/dL Final  . Creatinine, Ser 11/04/2018 1.55* 0.61 - 1.24 mg/dL Final  . Calcium 11/04/2018 9.1  8.9 - 10.3 mg/dL Final  . GFR calc non Af Amer 11/04/2018 47* >60 mL/min Final  . GFR calc Af Amer 11/04/2018 54* >60 mL/min Final  . Anion gap 11/04/2018 7  5 - 15 Final   Performed at [facility], [address] [place], [place] [address]  . WBC 11/04/2018 155.5* 4.0 - 10.5 K/uL Final   Comment: REPEATED TO VERIFY THIS CRITICAL RESULT HAS VERIFIED AND BEEN CALLED TO LISA THOMPSON BY FRANCISZKA [facility] ON 12 12 2019 AT 2117, AND HAS BEEN READ BACK.    Marland Kitchen RBC 11/04/2018 4.64  4.22 - 5.81 MIL/uL Final  . Hemoglobin 11/04/2018 12.0* 13.0 - 17.0 g/dL Final  . HCT 11/04/2018 41.4  39.0 - 52.0 % Final  . MCV 11/04/2018 89.2  80.0 - 100.0 fL Final  . MCH 11/04/2018 25.9* 26.0 - 34.0 pg Final  . MCHC 11/04/2018 29.0* 30.0 - 36.0 g/dL Final  . RDW 11/04/2018 14.6  11.5 - 15.5 % Final  . Platelets 11/04/2018 157  150 - 400 K/uL Final  . nRBC 11/04/2018 0.0  0.0 - 0.2 % Final   Performed at [facility], [address] [place], [place] [address]  . Troponin I 11/04/2018 <0.03  <0.03 ng/mL Final   Performed at [facility], [address] [place], [place] [address]  . WBC 11/04/2018 164.4* 4.0 - 10.5 K/uL Final   This critical result has verified and been called to [facility] by Leanora Ivanoff on 12  12 2019 at 2148, and has been read back.   Marland Kitchen RBC 11/04/2018 4.62  4.22 - 5.81 MIL/uL Final  . Hemoglobin 11/04/2018 12.0* 13.0 - 17.0 g/dL Final  . HCT  11/04/2018 41.6  39.0 - 52.0 % Final  . MCV 11/04/2018 90.0  80.0 - 100.0 fL Final  . MCH 11/04/2018 26.0  26.0 - 34.0 pg Final  . MCHC 11/04/2018 28.8* 30.0 - 36.0 g/dL Final  . RDW 11/04/2018 14.6  11.5 - 15.5 % Final  . Platelets 11/04/2018 158  150 - 400 K/uL Final   Comment: SPECIMEN CHECKED FOR CLOTS CONSISTENT WITH PREVIOUS RESULT   . nRBC 11/04/2018 0.0  0.0 - 0.2 % Final  . Neutrophils Relative % 11/04/2018 4  % Final  . Neutro Abs 11/04/2018 6.5  1.7 - 7.7 K/uL Final  . Lymphocytes Relative 11/04/2018 95  % Final  . Lymphs Abs 11/04/2018 155.2* 0.7 - 4.0 K/uL Final  . Monocytes Relative 11/04/2018 1  % Final  . Monocytes Absolute 11/04/2018 2.0* 0.1 - 1.0 K/uL Final  . Eosinophils Relative 11/04/2018 0  % Final  . Eosinophils Absolute 11/04/2018 0.1  0.0 - 0.5 K/uL Final  . Basophils Relative 11/04/2018 0  % Final  . Basophils Absolute 11/04/2018 0.2* 0.0 - 0.1 K/uL Final  . Immature Granulocytes 11/04/2018 0  % Final  . Abs Immature Granulocytes 11/04/2018 0.40* 0.00 - 0.07 K/uL Final   Performed at [facility], [address] [place], [place] [address]  . Path Review 11/04/2018 Peripheral blood smear is reviewed.   Final   Comment: Patient presented to ED after his physician called him regarding abnormal labs. Leukocytosis with abnormal lymphocytosis and smudge cells. Blasts are not identified. Unremarkable RBC and platelet morphology. Flow cytometric analysis is required for definitive diagnosis of suspected CLL. Per [facility] the patient has an appointment scheduled with an oncologist in [facility] for follow up. These findings were discussed with Dr. Lenise Arena in the ED on 11/04/2018 at 10:45 PM. Reviewed by Dellia Nims. Reuel Derby, M.D. Performed at [facility], [address] [place], [place] [address]     (this displays the last labs from the last 3 days)  No results found for: TOTALPROTELP, ALBUMINELP, A1GS, A2GS, BETS, BETA2SER, GAMS, MSPIKE,  SPEI (this displays SPEP labs)  No results found for: KPAFRELGTCHN, LAMBDASER, KAPLAMBRATIO (kappa/lambda light chains)  No results found for: HGBA, HGBA2QUANT, HGBFQUANT, HGBSQUAN (Hemoglobinopathy evaluation)   Lab Results  Component Value Date   LDH 182 11/05/2018    No results found for: IRON, TIBC, IRONPCTSAT (Iron and TIBC)  No results found for: FERRITIN  Urinalysis No results found for: COLORURINE, APPEARANCEUR, LABSPEC, PHURINE, GLUCOSEU, HGBUR, BILIRUBINUR, KETONESUR, PROTEINUR, UROBILINOGEN, NITRITE, LEUKOCYTESUR   STUDIES: No results found.  ELIGIBLE FOR AVAILABLE RESEARCH PROTOCOL: no  ASSESSMENT: 62 y.o. [place], [place] man with a diagnosis of chronic lymphoid leukemia made 11/04/2018  PLAN: I spent approximately 60 minutes face to face with the patient and his sister with more than 50% of that time spent in counseling and coordination of care. Specifically we reviewed the biology of the patient's diagnosis and the specifics of this situation.  They understand there are more than 200 types of cancer and but in general cancers are divided between carcinomas, sarcomas, and white cell cancers.  They understand that white cells are the immune system and that there are many different types of white cells.  They can be granular or not granular and the nongranular white cells  can be B cells or T cells  Among white cell cancers there are lymphomas and leukemias.  Leukemias are cancers in which most of the white cells are circulating in the blood whereas lymphomas or cancers in which most of the white cells are in lymph nodes.  Leukemias can be acute or chronic and the chronic leukemias can be granular or nongranular.  The chronic nongranular leukemias can be B cells or T cells so he has a chronic lymphoid leukemia which is most likely a B-cell type of chronic leukemia.  We discussed the fact that chronic lymphoid leukemia is are not curable but they are very treatable.  We  generally try to avoid chemotherapy and try immunotherapy or targeted agents.  Today we specifically discussed rituximab and they understand there is the possibility of a first dose reaction, and also the rare possibility of tumor lysis syndrome.  At this point, the plan is for definitive lab work 11/08/2018 with flow cytometry; rituximab to start 1230 and 1231, then weekly x8, after which likely we will start every 42-month maintenance.  I am starting him on allopurinol now.  I have encouraged him to call with any problems that may develop before the next visit here.   Chauncey Cruel, MD   11/05/2018 4:51 PM Medical Oncology and Hematology [facility] [address] [place], [place] [address] Tel. [phone]    Fax. [phone]

## 2018-11-06 LAB — BETA 2 MICROGLOBULIN, SERUM: Beta-2 Microglobulin: 3.4 mg/L — ABNORMAL HIGH (ref 0.6–2.4)

## 2018-11-08 ENCOUNTER — Telehealth: Payer: Self-pay | Admitting: Oncology

## 2018-11-08 ENCOUNTER — Other Ambulatory Visit: Payer: PRIVATE HEALTH INSURANCE

## 2018-11-08 ENCOUNTER — Ambulatory Visit: Payer: PRIVATE HEALTH INSURANCE | Admitting: Oncology

## 2018-11-08 NOTE — Telephone Encounter (Signed)
Per 12/13 los waiting for approval for 12/30 and 12/31 tx

## 2018-11-08 NOTE — Telephone Encounter (Signed)
Scheduled appt per 12/13 sch message - pt care giver is aware of appt .

## 2018-11-10 ENCOUNTER — Encounter: Payer: Self-pay | Admitting: Oncology

## 2018-11-10 ENCOUNTER — Telehealth: Payer: Self-pay | Admitting: Adult Health

## 2018-11-10 NOTE — Telephone Encounter (Signed)
Called regarding 12/30 and 31

## 2018-11-10 NOTE — Progress Notes (Signed)
Attempted to call pt to introduce myself as his Arboriculturist, discuss copay assistance and offer the [employer].  I was unable to get pt on the phone or leave a message.  I will try and see pt at his next visit.

## 2018-11-15 ENCOUNTER — Inpatient Hospital Stay: Payer: 59

## 2018-11-15 DIAGNOSIS — C9110 Chronic lymphocytic leukemia of B-cell type not having achieved remission: Secondary | ICD-10-CM

## 2018-11-15 LAB — CBC WITH DIFFERENTIAL/PLATELET
Abs Immature Granulocytes: 0.26 10*3/uL — ABNORMAL HIGH (ref 0.00–0.07)
Basophils Absolute: 0.1 10*3/uL (ref 0.0–0.1)
Basophils Relative: 0 %
Eosinophils Absolute: 0.2 10*3/uL (ref 0.0–0.5)
Eosinophils Relative: 0 %
HCT: 40.5 % (ref 39.0–52.0)
Hemoglobin: 11.6 g/dL — ABNORMAL LOW (ref 13.0–17.0)
Immature Granulocytes: 0 %
Lymphocytes Relative: 94 %
Lymphs Abs: 136.9 10*3/uL — ABNORMAL HIGH (ref 0.7–4.0)
MCH: 25.7 pg — ABNORMAL LOW (ref 26.0–34.0)
MCHC: 28.6 g/dL — ABNORMAL LOW (ref 30.0–36.0)
MCV: 89.6 fL (ref 80.0–100.0)
Monocytes Absolute: 2.7 10*3/uL — ABNORMAL HIGH (ref 0.1–1.0)
Monocytes Relative: 2 %
Neutro Abs: 5.8 10*3/uL (ref 1.7–7.7)
Neutrophils Relative %: 4 %
Platelets: 151 10*3/uL (ref 150–400)
RBC: 4.52 MIL/uL (ref 4.22–5.81)
RDW: 14.6 % (ref 11.5–15.5)
WBC: 145.9 10*3/uL (ref 4.0–10.5)
nRBC: 0 % (ref 0.0–0.2)

## 2018-11-15 LAB — COMPREHENSIVE METABOLIC PANEL
ALT: 48 U/L — ABNORMAL HIGH (ref 0–44)
AST: 31 U/L (ref 15–41)
Albumin: 4.3 g/dL (ref 3.5–5.0)
Alkaline Phosphatase: 94 U/L (ref 38–126)
Anion gap: 9 (ref 5–15)
BUN: 19 mg/dL (ref 8–23)
CO2: 27 mmol/L (ref 22–32)
Calcium: 8.9 mg/dL (ref 8.9–10.3)
Chloride: 106 mmol/L (ref 98–111)
Creatinine, Ser: 1.56 mg/dL — ABNORMAL HIGH (ref 0.61–1.24)
GFR calc Af Amer: 54 mL/min — ABNORMAL LOW (ref >60)
GFR calc non Af Amer: 47 mL/min — ABNORMAL LOW (ref >60)
Glucose, Bld: 128 mg/dL — ABNORMAL HIGH (ref 70–99)
Potassium: 4.3 mmol/L (ref 3.5–5.1)
Sodium: 142 mmol/L (ref 135–145)
Total Bilirubin: 1.3 mg/dL — ABNORMAL HIGH (ref 0.3–1.2)
Total Protein: 8.1 g/dL (ref 6.5–8.1)

## 2018-11-15 LAB — LACTATE DEHYDROGENASE: LDH: 218 U/L — ABNORMAL HIGH (ref 98–192)

## 2018-11-19 LAB — FLOW CYTOMETRY

## 2018-11-22 ENCOUNTER — Inpatient Hospital Stay: Payer: 59

## 2018-11-22 ENCOUNTER — Other Ambulatory Visit: Payer: 59

## 2018-11-22 ENCOUNTER — Other Ambulatory Visit: Payer: Self-pay | Admitting: Oncology

## 2018-11-22 ENCOUNTER — Other Ambulatory Visit: Payer: Self-pay

## 2018-11-22 VITALS — BP 134/82 | HR 95 | Temp 97.6°F | Resp 18 | Wt 224.2 lb

## 2018-11-22 DIAGNOSIS — C9110 Chronic lymphocytic leukemia of B-cell type not having achieved remission: Secondary | ICD-10-CM

## 2018-11-22 DIAGNOSIS — I10 Essential (primary) hypertension: Secondary | ICD-10-CM

## 2018-11-22 MED ORDER — FUROSEMIDE 10 MG/ML IJ SOLN
INTRAMUSCULAR | Status: AC
  Filled 2018-11-22: qty 2

## 2018-11-22 MED ORDER — SODIUM CHLORIDE 0.9 % IV SOLN
Freq: Once | INTRAVENOUS | Status: AC
  Administered 2018-11-22: 09:00:00 via INTRAVENOUS
  Filled 2018-11-22: qty 250

## 2018-11-22 MED ORDER — FUROSEMIDE 10 MG/ML IJ SOLN
10.0000 mg | INTRAMUSCULAR | Status: AC
  Administered 2018-11-22: 10 mg via INTRAVENOUS

## 2018-11-22 MED ORDER — SODIUM CHLORIDE 0.9 % IV SOLN: 10.0000 mg | Freq: Once | INTRAVENOUS | Status: DC

## 2018-11-22 MED ORDER — DEXAMETHASONE SODIUM PHOSPHATE 10 MG/ML IJ SOLN
INTRAMUSCULAR | Status: AC
  Filled 2018-11-22: qty 1

## 2018-11-22 MED ORDER — SODIUM CHLORIDE 0.9 % IV SOLN
100.0000 mg | Freq: Once | INTRAVENOUS | Status: AC
  Administered 2018-11-22: 100 mg via INTRAVENOUS
  Filled 2018-11-22: qty 10

## 2018-11-22 MED ORDER — ACETAMINOPHEN 325 MG PO TABS
650.0000 mg | ORAL_TABLET | Freq: Once | ORAL | Status: AC
  Administered 2018-11-22: 650 mg via ORAL

## 2018-11-22 MED ORDER — DIPHENHYDRAMINE HCL 25 MG PO CAPS
ORAL_CAPSULE | ORAL | Status: AC
  Filled 2018-11-22: qty 1

## 2018-11-22 MED ORDER — ACETAMINOPHEN 325 MG PO TABS
ORAL_TABLET | ORAL | Status: AC
  Filled 2018-11-22: qty 2

## 2018-11-22 MED ORDER — DEXAMETHASONE SODIUM PHOSPHATE 10 MG/ML IJ SOLN
10.0000 mg | Freq: Once | INTRAMUSCULAR | Status: AC
  Administered 2018-11-22: 10 mg via INTRAVENOUS

## 2018-11-22 MED ORDER — ONDANSETRON HCL 4 MG PO TABS: 4.0000 mg | ORAL_TABLET | Freq: Three times a day (TID) | ORAL | 0 refills | Status: DC | PRN

## 2018-11-22 MED ORDER — DIPHENHYDRAMINE HCL 25 MG PO CAPS
25.0000 mg | ORAL_CAPSULE | Freq: Once | ORAL | Status: AC
  Administered 2018-11-22: 25 mg via ORAL

## 2018-11-22 NOTE — Patient Instructions (Signed)
[facility] Discharge Instructions for Patients Receiving Chemotherapy  Today you received the following chemotherapy agents Rituximab (RITUXAN).  To help prevent nausea and vomiting after your treatment, we encourage you to take your nausea medication as prescribed.   If you develop nausea and vomiting that is not controlled by your nausea medication, call the clinic.   BELOW ARE SYMPTOMS THAT SHOULD BE REPORTED IMMEDIATELY:  *FEVER GREATER THAN 100.5 F  *CHILLS WITH OR WITHOUT FEVER  NAUSEA AND VOMITING THAT IS NOT CONTROLLED WITH YOUR NAUSEA MEDICATION  *UNUSUAL SHORTNESS OF BREATH  *UNUSUAL BRUISING OR BLEEDING  TENDERNESS IN MOUTH AND THROAT WITH OR WITHOUT PRESENCE OF ULCERS  *URINARY PROBLEMS  *BOWEL PROBLEMS  UNUSUAL RASH Items with * indicate a potential emergency and should be followed up as soon as possible.  Feel free to call the clinic should you have any questions or concerns. The clinic phone number is [phone].  Please show the CHEMO ALERT CARD at check-in to the Emergency Department and triage nurse.  Rituximab injection What is this medicine? RITUXIMAB (ri TUX i mab) is a monoclonal antibody. It is used to treat certain types of cancer like non-Hodgkin lymphoma and chronic lymphocytic leukemia. It is also used to treat rheumatoid arthritis, granulomatosis with polyangiitis (or Wegener's granulomatosis), microscopic polyangiitis, and pemphigus vulgaris. This medicine may be used for other purposes; ask your health care provider or pharmacist if you have questions. COMMON BRAND NAME(S): Rituxan What should I tell my health care provider before I take this medicine? They need to know if you have any of these conditions: -heart disease -infection (especially a virus infection such as hepatitis B, chickenpox, cold sores, or herpes) -immune system problems -irregular heartbeat -kidney disease -low blood counts, like low white cell,  platelet, or red cell counts -lung or breathing disease, like asthma -recently received or scheduled to receive a vaccine -an unusual or allergic reaction to rituximab, other medicines, foods, dyes, or preservatives -pregnant or trying to get pregnant -breast-feeding How should I use this medicine? This medicine is for infusion into a vein. It is administered in a hospital or clinic by a specially trained health care professional. A special MedGuide will be given to you by the pharmacist with each prescription and refill. Be sure to read this information carefully each time. Talk to your pediatrician regarding the use of this medicine in children. This medicine is not approved for use in children. Overdosage: If you think you have taken too much of this medicine contact a poison control center or emergency room at once. NOTE: This medicine is only for you. Do not share this medicine with others. What if I miss a dose? It is important not to miss a dose. Call your doctor or health care professional if you are unable to keep an appointment. What may interact with this medicine? -cisplatin -live virus vaccines This list may not describe all possible interactions. Give your health care provider a list of all the medicines, herbs, non-prescription drugs, or dietary supplements you use. Also tell them if you smoke, drink alcohol, or use illegal drugs. Some items may interact with your medicine. What should I watch for while using this medicine? Your condition will be monitored carefully while you are receiving this medicine. You may need blood work done while you are taking this medicine. This medicine can cause serious allergic reactions. To reduce your risk you may need to take medicine before treatment with this medicine. Take your medicine as   directed. In some patients, this medicine may cause a serious brain infection that may cause death. If you have any problems seeing, thinking, speaking,  walking, or standing, tell your healthcare professional right away. If you cannot reach your healthcare professional, urgently seek other source of medical care. Call your doctor or health care professional for advice if you get a fever, chills or sore throat, or other symptoms of a cold or flu. Do not treat yourself. This drug decreases your body's ability to fight infections. Try to avoid being around people who are sick. Do not become pregnant while taking this medicine or for 12 months after stopping it. Women should inform their doctor if they wish to become pregnant or think they might be pregnant. There is a potential for serious side effects to an unborn child. Talk to your health care professional or pharmacist for more information. Do not breast-feed an infant while taking this medicine or for 6 months after stopping it. What side effects may I notice from receiving this medicine? Side effects that you should report to your doctor or health care professional as soon as possible: -allergic reactions like skin rash, itching or hives; swelling of the face, lips, or tongue -breathing problems -chest pain -changes in vision -diarrhea -headache with fever, neck stiffness, sensitivity to light, nausea, or confusion -fast, irregular heartbeat -loss of memory -low blood counts - this medicine may decrease the number of white blood cells, red blood cells and platelets. You may be at increased risk for infections and bleeding. -mouth sores -problems with balance, talking, or walking -redness, blistering, peeling or loosening of the skin, including inside the mouth -signs of infection - fever or chills, cough, sore throat, pain or difficulty passing urine -signs and symptoms of kidney injury like trouble passing urine or change in the amount of urine -signs and symptoms of liver injury like dark yellow or brown urine; general ill feeling or flu-like symptoms; light-colored stools; loss of appetite;  nausea; right upper belly pain; unusually weak or tired; yellowing of the eyes or skin -signs and symptoms of low blood pressure like dizziness; feeling faint or lightheaded, falls; unusually weak or tired -stomach pain -swelling of the ankles, feet, hands -unusual bleeding or bruising -vomiting Side effects that usually do not require medical attention (report to your doctor or health care professional if they continue or are bothersome): -headache -joint pain -muscle cramps or muscle pain -nausea -tiredness This list may not describe all possible side effects. Call your doctor for medical advice about side effects. You may report side effects to FDA at 1-800-FDA-1088. Where should I keep my medicine? This drug is given in a hospital or clinic and will not be stored at home. NOTE: This sheet is a summary. It may not cover all possible information. If you have questions about this medicine, talk to your doctor, pharmacist, or health care provider.  2019 Elsevier/Gold Standard (2017-10-23 13:04:32)    

## 2018-11-22 NOTE — Progress Notes (Signed)
Patient presented today for first time Rituximab (RITUXAN). Infusion started at 1000, 20 minutes after, patient started gagging, complaining of nausea, vomiting and became flushed. Infusion stopped immediately, 1 liter NS started and Dr. Jana Hakim notified. 20 mg Pepcid was given at 1025 and 10 mg Lasix per verbal order at 1033 for elevated BP. Vitals obtained and documented in flowsheet. Dr. Jana Hakim came by to assess patient and ordered for infusion to resume at a slow rate. Patient was observed for 30 minutes, vitals stable, per Dr. Jana Hakim infusion restarted at 1101 at slow rate for an hour with vitals checked at 30 minutes intervals. Patient tolerated this well. Infusion rate was bumped to next level at 1205 for another 30 minutes and he tolerated this well too. Patient completed infusion at 1235 with no further issues. Patient has legal guardianship. AVS and schedule provided to patient's sister and education given. Order for home anti-nausea medication sent to patient's pharmacy per desk nurse for pick up.

## 2018-11-23 ENCOUNTER — Other Ambulatory Visit: Payer: 59

## 2018-11-23 ENCOUNTER — Inpatient Hospital Stay: Payer: 59

## 2018-11-23 ENCOUNTER — Emergency Department (HOSPITAL_COMMUNITY)
Admission: EM | Admit: 2018-11-23 | Discharge: 2018-11-23 | Disposition: A | Discharge status: Home / Self Care | Payer: 59 | Source: Home / Self Care | Attending: Emergency Medicine | Admitting: Emergency Medicine

## 2018-11-23 ENCOUNTER — Telehealth: Payer: Self-pay | Admitting: *Deleted

## 2018-11-23 ENCOUNTER — Encounter (HOSPITAL_COMMUNITY): Payer: Self-pay | Admitting: *Deleted

## 2018-11-23 ENCOUNTER — Other Ambulatory Visit: Payer: Self-pay | Admitting: Oncology

## 2018-11-23 VITALS — BP 120/68 | HR 80 | Temp 97.9°F | Resp 18

## 2018-11-23 DIAGNOSIS — E1165 Type 2 diabetes mellitus with hyperglycemia: Secondary | ICD-10-CM | POA: Insufficient documentation

## 2018-11-23 DIAGNOSIS — Z79899 Other long term (current) drug therapy: Secondary | ICD-10-CM | POA: Insufficient documentation

## 2018-11-23 DIAGNOSIS — C9190 Lymphoid leukemia, unspecified not having achieved remission: Secondary | ICD-10-CM | POA: Insufficient documentation

## 2018-11-23 DIAGNOSIS — Z794 Long term (current) use of insulin: Secondary | ICD-10-CM

## 2018-11-23 DIAGNOSIS — R739 Hyperglycemia, unspecified: Secondary | ICD-10-CM

## 2018-11-23 DIAGNOSIS — C9110 Chronic lymphocytic leukemia of B-cell type not having achieved remission: Secondary | ICD-10-CM

## 2018-11-23 LAB — CBC
HCT: 38.7 % — ABNORMAL LOW (ref 39.0–52.0)
Hemoglobin: 11.3 g/dL — ABNORMAL LOW (ref 13.0–17.0)
MCH: 25.7 pg — ABNORMAL LOW (ref 26.0–34.0)
MCHC: 29.2 g/dL — ABNORMAL LOW (ref 30.0–36.0)
MCV: 88.2 fL (ref 80.0–100.0)
Platelets: 122 10*3/uL — ABNORMAL LOW (ref 150–400)
RBC: 4.39 MIL/uL (ref 4.22–5.81)
RDW: 14.6 % (ref 11.5–15.5)
WBC: 91.3 10*3/uL (ref 4.0–10.5)
nRBC: 0 % (ref 0.0–0.2)

## 2018-11-23 LAB — URINALYSIS, ROUTINE W REFLEX MICROSCOPIC
Bacteria, UA: NONE SEEN
Bilirubin Urine: NEGATIVE
Glucose, UA: >=500 mg/dL — AB
Hgb urine dipstick: NEGATIVE
Ketones, ur: NEGATIVE mg/dL
Leukocytes, UA: NEGATIVE
Nitrite: NEGATIVE
Protein, ur: NEGATIVE mg/dL
Specific Gravity, Urine: 1.02 (ref 1.005–1.030)
pH: 5 (ref 5.0–8.0)

## 2018-11-23 LAB — BASIC METABOLIC PANEL
Anion gap: 14 (ref 5–15)
BUN: 37 mg/dL — ABNORMAL HIGH (ref 8–23)
CO2: 20 mmol/L — ABNORMAL LOW (ref 22–32)
Calcium: 8.4 mg/dL — ABNORMAL LOW (ref 8.9–10.3)
Chloride: 101 mmol/L (ref 98–111)
Creatinine, Ser: 1.95 mg/dL — ABNORMAL HIGH (ref 0.61–1.24)
GFR calc Af Amer: 41 mL/min — ABNORMAL LOW (ref >60)
GFR calc non Af Amer: 36 mL/min — ABNORMAL LOW (ref >60)
Glucose, Bld: 424 mg/dL — ABNORMAL HIGH (ref 70–99)
Potassium: 5.1 mmol/L (ref 3.5–5.1)
Sodium: 135 mmol/L (ref 135–145)

## 2018-11-23 LAB — CBG MONITORING, ED
Glucose-Capillary: 389 mg/dL — ABNORMAL HIGH (ref 70–99)
Glucose-Capillary: 341 mg/dL — ABNORMAL HIGH (ref 70–99)

## 2018-11-23 MED ORDER — FAMOTIDINE IN NACL 20-0.9 MG/50ML-% IV SOLN
20.0000 mg | Freq: Once | INTRAVENOUS | Status: AC
  Administered 2018-11-23: 20 mg via INTRAVENOUS

## 2018-11-23 MED ORDER — ACETAMINOPHEN 325 MG PO TABS
ORAL_TABLET | ORAL | Status: AC
  Filled 2018-11-23: qty 2

## 2018-11-23 MED ORDER — DEXAMETHASONE SODIUM PHOSPHATE 10 MG/ML IJ SOLN
10.0000 mg | Freq: Once | INTRAMUSCULAR | Status: AC
  Administered 2018-11-23: 10 mg via INTRAVENOUS

## 2018-11-23 MED ORDER — DIPHENHYDRAMINE HCL 25 MG PO CAPS
25.0000 mg | ORAL_CAPSULE | Freq: Once | ORAL | Status: AC
  Administered 2018-11-23: 25 mg via ORAL

## 2018-11-23 MED ORDER — DEXAMETHASONE SODIUM PHOSPHATE 10 MG/ML IJ SOLN
INTRAMUSCULAR | Status: AC
  Filled 2018-11-23: qty 1

## 2018-11-23 MED ORDER — SODIUM CHLORIDE 0.9 % IV SOLN: 10.0000 mg | Freq: Once | INTRAVENOUS | Status: DC

## 2018-11-23 MED ORDER — SODIUM CHLORIDE 0.9 % IV BOLUS
1000.0000 mL | Freq: Once | INTRAVENOUS | Status: AC
  Administered 2018-11-23: 1000 mL via INTRAVENOUS

## 2018-11-23 MED ORDER — SODIUM CHLORIDE 0.9 % IV SOLN
375.0000 mg/m2 | Freq: Once | INTRAVENOUS | Status: AC
  Administered 2018-11-23: 900 mg via INTRAVENOUS
  Filled 2018-11-23: qty 50

## 2018-11-23 MED ORDER — FAMOTIDINE IN NACL 20-0.9 MG/50ML-% IV SOLN
INTRAVENOUS | Status: AC
  Filled 2018-11-23: qty 50

## 2018-11-23 MED ORDER — SODIUM CHLORIDE 0.9 % IV SOLN
Freq: Once | INTRAVENOUS | Status: AC
  Administered 2018-11-23: 08:00:00 via INTRAVENOUS
  Filled 2018-11-23: qty 250

## 2018-11-23 MED ORDER — ACETAMINOPHEN 325 MG PO TABS
650.0000 mg | ORAL_TABLET | Freq: Once | ORAL | Status: AC
  Administered 2018-11-23: 650 mg via ORAL

## 2018-11-23 MED ORDER — DIPHENHYDRAMINE HCL 25 MG PO CAPS
ORAL_CAPSULE | ORAL | Status: AC
  Filled 2018-11-23: qty 1

## 2018-11-23 NOTE — ED Triage Notes (Signed)
Pt in after chemo treatment and his glucose level there was over 600, pt has history of diabetes, denies changes to his diabetic medication but the hyperglycemia can be a side effect from the chemo

## 2018-11-23 NOTE — ED Provider Notes (Signed)
[facility] EMERGENCY DEPARTMENT Provider Note   CSN: [id number] Arrival date & time: 11/23/18  1454     History   Chief Complaint Chief Complaint  Patient presents with  . Hyperglycemia    HPI Cody [name] is a 62 y.o. male.  Patient with history of diabetes with recent diagnosis of chronic lymphoid leukemia, today is day 2 of chemotherapy for this --presents the emergency department tonight with elevated blood sugars.  Patient's family noted blood sugar to be in the 400s earlier this morning and were greater than 600 this evening after his treatment.  Family gave an additional dose of subcutaneous insulin.  Patient denies any fevers, nausea, vomiting, diarrhea.  No abdominal pain or dysuria. CBG into 300's on arrival. The onset of this condition was acute. The course is constant. Aggravating factors: none. Alleviating factors: none.       Past Medical History:  Diagnosis Date  . Diabetes mellitus without complication [facility])     Patient Active Problem List   Diagnosis Date Noted  . Chronic lymphoid leukemia ([place]) 11/05/2018  . Goals of care, counseling/discussion 11/05/2018    History reviewed. No pertinent surgical history.      Home Medications    Prior to Admission medications   Medication Sig Start Date End Date Taking? Authorizing Provider  allopurinol (ZYLOPRIM) 300 MG tablet Take 1 tablet (300 mg total) by mouth daily. 11/05/18   Magrinat, Virgie Dad, MD  amLODipine (NORVASC) 10 MG tablet Take 10 mg by mouth daily.    [provider]  atorvastatin (LIPITOR) 10 MG tablet Take 10 mg by mouth daily.    [provider]  Insulin Glargine, 2 Unit Dial, (TOUJEO MAX SOLOSTAR) 300 UNIT/ML SOPN Inject into the skin.    [provider]  JENTADUETO 2.03-999 MG TABS Take 1 tablet by mouth daily with supper.  04/07/16   [provider]  metFORMIN (GLUCOPHAGE) 1000 MG tablet  05/12/16   [provider]    ondansetron (ZOFRAN) 4 MG tablet Take 1 tablet (4 mg total) by mouth every 8 (eight) hours as needed for nausea or vomiting. 11/22/18   Magrinat, Virgie Dad, MD  [facility] DELICA LANCETS FINE MISC 3 (three) times daily.  03/28/16   [provider]  telmisartan-hydrochlorothiazide (MICARDIS HCT) 80-25 MG tablet Take 1 tablet by mouth daily.    [provider]    Family History History reviewed. No pertinent family history.  Social History Social History   Tobacco Use  . Smoking status: Never Smoker  . Smokeless tobacco: Never Used  Substance Use Topics  . Alcohol use: Not Currently  . Drug use: No     Allergies   Patient has no known allergies.   Review of Systems Review of Systems  Constitutional: Negative for fever.  HENT: Negative for rhinorrhea and sore throat.   Eyes: Negative for redness.  Respiratory: Negative for cough.   Cardiovascular: Negative for chest pain.  Gastrointestinal: Negative for abdominal pain, diarrhea, nausea and vomiting.  Genitourinary: Negative for dysuria.  Musculoskeletal: Negative for myalgias.  Skin: Negative for rash.  Neurological: Negative for headaches.     Physical Exam Updated Vital Signs BP (!) 153/71 (BP Location: Right Arm)   Pulse 95   Temp 97.8 F (36.6 C) (Oral)   Resp 18   SpO2 96%   Physical Exam Vitals signs and nursing note reviewed.  Constitutional:      Appearance: He is well-developed.  HENT:  Head: Normocephalic and atraumatic.     Mouth/Throat:     Mouth: Mucous membranes are dry.  Eyes:     General:        Right eye: No discharge.        Left eye: No discharge.     Conjunctiva/sclera: Conjunctivae normal.  Neck:     Musculoskeletal: Normal range of motion and neck supple.  Cardiovascular:     Rate and Rhythm: Normal rate and regular rhythm.     Heart sounds: Normal heart sounds.  Pulmonary:     Effort: Pulmonary effort is normal.     Breath sounds: Normal breath sounds.   Abdominal:     Palpations: Abdomen is soft.     Tenderness: There is no abdominal tenderness.  Skin:    General: Skin is warm and dry.  Neurological:     Mental Status: He is alert.      ED Treatments / Results  Labs (all labs ordered are listed, but only abnormal results are displayed) Labs Reviewed  BASIC METABOLIC PANEL - Abnormal; Notable for the following components:      Result Value   CO2 20 (*)    Glucose, Bld 424 (*)    BUN 37 (*)    Creatinine, Ser 1.95 (*)    Calcium 8.4 (*)    GFR calc non Af Amer 36 (*)    GFR calc Af Amer 41 (*)    All other components within normal limits  CBC - Abnormal; Notable for the following components:   WBC 91.3 (*)    Hemoglobin 11.3 (*)    HCT 38.7 (*)    MCH 25.7 (*)    MCHC 29.2 (*)    Platelets 122 (*)    All other components within normal limits  URINALYSIS, ROUTINE W REFLEX MICROSCOPIC - Abnormal; Notable for the following components:   Color, Urine STRAW (*)    Glucose, UA >=500 (*)    All other components within normal limits  CBG MONITORING, ED - Abnormal; Notable for the following components:   Glucose-Capillary 389 (*)    All other components within normal limits  CBG MONITORING, ED - Abnormal; Notable for the following components:   Glucose-Capillary 341 (*)    All other components within normal limits    EKG None  Radiology No results found.  Procedures Procedures (including critical care time)  Medications Ordered in ED Medications  sodium chloride 0.9 % bolus 1,000 mL (0 mLs Intravenous Stopped 11/23/18 2237)     Initial Impression / Assessment and Plan / ED Course  I have reviewed the triage vital signs and the nursing notes.  Pertinent labs & imaging results that were available during my care of the patient were reviewed by me and considered in my medical decision making (see chart for details).     Patient seen and examined. Work-up initiated.  No clinical DKA but will check electrolytes.   Blood sugar is improved after additional insulin given by family.  Will hydrate.  Vital signs reviewed and are as follows: BP (!) 153/71 (BP Location: Right Arm)   Pulse 95   Temp 97.8 F (36.6 C) (Oral)   Resp 18   SpO2 96%   10:48 PM Patient has received IV fluids.  Discussed with and seen by Dr. Ashok Cordia.   No signs of DKA on lab work.  Blood sugar from 400s to 300s.  Patient appears well.  Ready for discharged home.  Discussed that if they  check blood sugar and is greater than 400 that they may give additional one-time half dose of insulin as long as he can monitor blood sugars carefully at home.  Patient has had diabetes for several years and is well aware of when his blood sugar is getting low.  Otherwise, they will continue to discuss blood pressure sugar management with their doctors.  Final Clinical Impressions(s) / ED Diagnoses   Final diagnoses:  Hyperglycemia   Patient with hyperglycemia in setting of new treatment for leukemia.  No signs of DKA today.  Blood sugars 300-400 range.  Slight elevation in creatinine above baseline, treated with IV fluids.  Potassium 5.1.  Patient is very stable.  No signs of infection.  Will be discharged home with plan as above.  ED Discharge Orders    None       Carlisle Cater, Hershal Coria 11/23/18 2250    Lajean Saver, MD 11/24/18 [phone]

## 2018-11-23 NOTE — Discharge Instructions (Signed)
Please read and follow all provided instructions.  Your diagnoses today include:  1. Hyperglycemia    Tests performed today include:  Blood counts and electrolytes - no sign of acid buildup from diabetes  Urine test  Vital signs. See below for your results today.   Medications prescribed:   None  Take any prescribed medications only as directed.  Home care instructions:  Follow any educational materials contained in this packet.  If your blood sugar is greater than 400, you may consider taking an extra half dose of insulin if you are able to closely monitor your blood sugars.  Follow-up instructions: Please follow-up with your primary care provider in the next 3 days for further evaluation of your symptoms.   Return instructions:   Please return to the Emergency Department if you experience worsening symptoms.   Please return if you have any other emergent concerns.  Additional Information:  Your vital signs today were: BP (!) 150/83    Pulse 81    Temp 97.8 F (36.6 C) (Oral)    Resp 17    SpO2 97%  If your blood pressure (BP) was elevated above 135/85 this visit, please have this repeated by your doctor within one month. --------------

## 2018-11-23 NOTE — Telephone Encounter (Signed)
Sister states Cody [name] glucose this morning was 425. Now after infusion and arriving home it is "over 600". Dr Jana Hakim wants them to take him to the ED. Verbalized understanding.

## 2018-11-23 NOTE — Patient Instructions (Signed)
[facility] Discharge Instructions for Patients Receiving Chemotherapy  Today you received the following chemotherapy agents:  Rituxan.  To help prevent nausea and vomiting after your treatment, we encourage you to take your nausea medication as directed.   If you develop nausea and vomiting that is not controlled by your nausea medication, call the clinic.   BELOW ARE SYMPTOMS THAT SHOULD BE REPORTED IMMEDIATELY:  *FEVER GREATER THAN 100.5 F  *CHILLS WITH OR WITHOUT FEVER  NAUSEA AND VOMITING THAT IS NOT CONTROLLED WITH YOUR NAUSEA MEDICATION  *UNUSUAL SHORTNESS OF BREATH  *UNUSUAL BRUISING OR BLEEDING  TENDERNESS IN MOUTH AND THROAT WITH OR WITHOUT PRESENCE OF ULCERS  *URINARY PROBLEMS  *BOWEL PROBLEMS  UNUSUAL RASH Items with * indicate a potential emergency and should be followed up as soon as possible.  Feel free to call the clinic should you have any questions or concerns. The clinic phone number is [phone].  Please show the CHEMO ALERT CARD at check-in to the Emergency Department and triage nurse.   

## 2018-11-23 NOTE — Progress Notes (Signed)
COURTESY NOTE:  "Cody [name]" Gumina is a 62 y.o. [place], [place] man with a diagnosis of chronic lymphoid leukemia made 11/04/2018. He received rituximab 12/30 and 11/23/2018. Premeds included dexamethasone 10 mg both days. His blood sugar rose to >600 this afternoon and he was referred to the ED.  His prognosis from the point of view of CLL is excellent.  I appreciate your help to this patient. Please let us know if we can be of further assistance at this point.

## 2018-11-28 ENCOUNTER — Encounter: Payer: Self-pay | Admitting: Oncology

## 2018-11-29 ENCOUNTER — Telehealth: Payer: Self-pay | Admitting: Adult Health

## 2018-11-29 ENCOUNTER — Encounter: Payer: Self-pay | Admitting: Adult Health

## 2018-11-29 ENCOUNTER — Inpatient Hospital Stay: Payer: 59 | Attending: Oncology

## 2018-11-29 ENCOUNTER — Inpatient Hospital Stay (HOSPITAL_BASED_OUTPATIENT_CLINIC_OR_DEPARTMENT_OTHER): Payer: 59 | Admitting: Adult Health

## 2018-11-29 ENCOUNTER — Inpatient Hospital Stay: Payer: 59

## 2018-11-29 VITALS — BP 137/81 | HR 77 | Temp 98.8°F | Resp 18

## 2018-11-29 VITALS — BP 146/80 | HR 98 | Temp 97.9°F | Resp 18 | Ht 72.0 in | Wt 226.9 lb

## 2018-11-29 DIAGNOSIS — E1165 Type 2 diabetes mellitus with hyperglycemia: Secondary | ICD-10-CM | POA: Diagnosis not present

## 2018-11-29 DIAGNOSIS — C9110 Chronic lymphocytic leukemia of B-cell type not having achieved remission: Secondary | ICD-10-CM

## 2018-11-29 DIAGNOSIS — Z794 Long term (current) use of insulin: Secondary | ICD-10-CM | POA: Diagnosis not present

## 2018-11-29 DIAGNOSIS — Z5112 Encounter for antineoplastic immunotherapy: Secondary | ICD-10-CM | POA: Diagnosis present

## 2018-11-29 LAB — COMPREHENSIVE METABOLIC PANEL
ALT: 42 U/L (ref 0–44)
AST: 17 U/L (ref 15–41)
Albumin: 4.2 g/dL (ref 3.5–5.0)
Alkaline Phosphatase: 88 U/L (ref 38–126)
Anion gap: 13 (ref 5–15)
BUN: 27 mg/dL — ABNORMAL HIGH (ref 8–23)
CO2: 24 mmol/L (ref 22–32)
Calcium: 9.8 mg/dL (ref 8.9–10.3)
Chloride: 103 mmol/L (ref 98–111)
Creatinine, Ser: 1.67 mg/dL — ABNORMAL HIGH (ref 0.61–1.24)
GFR calc Af Amer: 49 mL/min — ABNORMAL LOW (ref >60)
GFR calc non Af Amer: 43 mL/min — ABNORMAL LOW (ref >60)
Glucose, Bld: 220 mg/dL — ABNORMAL HIGH (ref 70–99)
Potassium: 4.4 mmol/L (ref 3.5–5.1)
Sodium: 140 mmol/L (ref 135–145)
Total Bilirubin: 0.8 mg/dL (ref 0.3–1.2)
Total Protein: 7.8 g/dL (ref 6.5–8.1)

## 2018-11-29 LAB — CBC WITH DIFFERENTIAL/PLATELET
Abs Immature Granulocytes: 0.26 10*3/uL — ABNORMAL HIGH (ref 0.00–0.07)
Basophils Absolute: 0 10*3/uL (ref 0.0–0.1)
Basophils Relative: 0 %
Eosinophils Absolute: 0.2 10*3/uL (ref 0.0–0.5)
Eosinophils Relative: 1 %
HCT: 41 % (ref 39.0–52.0)
Hemoglobin: 12.8 g/dL — ABNORMAL LOW (ref 13.0–17.0)
Immature Granulocytes: 2 %
Lymphocytes Relative: 38 %
Lymphs Abs: 4.4 10*3/uL — ABNORMAL HIGH (ref 0.7–4.0)
MCH: 26.7 pg (ref 26.0–34.0)
MCHC: 31.2 g/dL (ref 30.0–36.0)
MCV: 85.6 fL (ref 80.0–100.0)
Monocytes Absolute: 0.4 10*3/uL (ref 0.1–1.0)
Monocytes Relative: 3 %
Neutro Abs: 6.5 10*3/uL (ref 1.7–7.7)
Neutrophils Relative %: 56 %
Platelets: 123 10*3/uL — ABNORMAL LOW (ref 150–400)
RBC: 4.79 MIL/uL (ref 4.22–5.81)
RDW: 14.4 % (ref 11.5–15.5)
WBC: 11.6 10*3/uL — ABNORMAL HIGH (ref 4.0–10.5)
nRBC: 0 % (ref 0.0–0.2)

## 2018-11-29 LAB — LACTATE DEHYDROGENASE: LDH: 178 U/L (ref 98–192)

## 2018-11-29 MED ORDER — DIPHENHYDRAMINE HCL 25 MG PO CAPS
25.0000 mg | ORAL_CAPSULE | Freq: Once | ORAL | Status: AC
  Administered 2018-11-29: 25 mg via ORAL

## 2018-11-29 MED ORDER — FAMOTIDINE IN NACL 20-0.9 MG/50ML-% IV SOLN
INTRAVENOUS | Status: AC
  Filled 2018-11-29: qty 50

## 2018-11-29 MED ORDER — SODIUM CHLORIDE 0.9 % IV SOLN
375.0000 mg/m2 | Freq: Once | INTRAVENOUS | Status: AC
  Administered 2018-11-29: 900 mg via INTRAVENOUS
  Filled 2018-11-29: qty 50

## 2018-11-29 MED ORDER — ACETAMINOPHEN 325 MG PO TABS
ORAL_TABLET | ORAL | Status: AC
  Filled 2018-11-29: qty 2

## 2018-11-29 MED ORDER — DIPHENHYDRAMINE HCL 25 MG PO CAPS
ORAL_CAPSULE | ORAL | Status: AC
  Filled 2018-11-29: qty 1

## 2018-11-29 MED ORDER — ACETAMINOPHEN 325 MG PO TABS
650.0000 mg | ORAL_TABLET | Freq: Once | ORAL | Status: AC
  Administered 2018-11-29: 650 mg via ORAL

## 2018-11-29 MED ORDER — SODIUM CHLORIDE 0.9 % IV SOLN
Freq: Once | INTRAVENOUS | Status: AC
  Administered 2018-11-29: 13:00:00 via INTRAVENOUS
  Filled 2018-11-29: qty 250

## 2018-11-29 MED ORDER — FAMOTIDINE IN NACL 20-0.9 MG/50ML-% IV SOLN
20.0000 mg | Freq: Once | INTRAVENOUS | Status: AC
  Administered 2018-11-29: 20 mg via INTRAVENOUS

## 2018-11-29 NOTE — Progress Notes (Signed)
Per Dr. Jana Hakim, okay to treat with crt of 1.67

## 2018-11-29 NOTE — Telephone Encounter (Signed)
Gave avs and calendar ° °

## 2018-11-29 NOTE — Progress Notes (Signed)
[facility]  [phone] [phone]     ID: Cody [name] DOB: [birth year]  MR#: [id number]  PJA#:[id number]  Patient Care Team: [name], MD as PCP - General (Family Medicine) Magrinat, Virgie Dad, MD as Consulting Physician (Oncology) Scot Dock, NP OTHER MD:  CHIEF COMPLAINT: Chronic lymphoid leukemia  CURRENT TREATMENT: Rituximab   HISTORY OF CURRENT ILLNESS: Cody [name] was evaluated by his primary care physician and found to have a very large white cell count.  He was asked to go to the emergency room which the patient did on 11/04/2018 at 8 PM.  The patient was found to be stable, with a white cell count of greater than 150,000, most of which were lymphocytes.  A pathology smear review by Dr. Reuel Derby  showed lymphocytosis and smudge cells.  There were no blasts.  RBCs and platelets were unremarkable.  The patient was referred to our clinic for further evaluation  Mr. Amedee subsequent history is as detailed below.  INTERVAL HISTORY: Cody [name] is here today, accompanied by his sister Manuella Ghazi for follow up of his CLL.  He is receiving Rituximab weekly.  Last week he began his treatments, however his blood sugar increased to 600 and he went to the emergency room.  He received IV fluids and additional insulin and his blood sugar decreased to the 300s.  He was given additional insulin recommendations and the sugars have since improved somewhat.  His blood sugars normally run around 160s, however lately they have been increasingly elevated to the 250s in the morning.   He receives insulin BID as per the ER recommendations and his sugars have been improving since then.     REVIEW OF SYSTEMS: Cody [name] is doing well today.  He denies any fatigue, night sweats, pain, lymphadenopathy, pruritus, weight loss.  His appetite remains stable.  He is without fevers, chills, chest pain, cough, shortness of breath, palpitations.  He denies any  bowel/bladder changes.  He hasn't noted any mouth ulcerations, dysphagia, indigestion.  His activity level has remained the same.  He has continued to live at home with his sister who is his primary caregiver, caring for him and his 36 year old mother.    PAST MEDICAL HISTORY: Past Medical History:  Diagnosis Date  . Diabetes mellitus without complication ([place])   Hypertension, hypercholesterolemia, cataracts, seasonal allergies, cirrhosis, history of EtOH abuse  PAST SURGICAL HISTORY: History reviewed. No pertinent surgical history. Surgery for incarcerated bowel release  FAMILY HISTORY History reviewed. No pertinent family history.  The patient's father died from cirrhosis at age 60 in the setting of alcohol abuse; he also had significant coronary artery disease.  The patient's mother is living, 65 years old as of December 2019.  The patient has 2 sisters, no brothers.  They are not aware of any cancer history in the family  SOCIAL HISTORY:  The patient used to work in [employer] for the [employer] [place].  He was briefly married but the marriage has been annulled.  The patient currently lives with his sister Demetruis Depaul and their mother.  A second sister, Charma Igo, lives in [place].     ADVANCED DIRECTIVES: The patient Sister [name] is his healthcare power of attorney.  She can be reached at [phone].   HEALTH MAINTENANCE: Social History   Tobacco Use  . Smoking status: Never Smoker  . Smokeless tobacco: Never Used  Substance Use Topics  . Alcohol use: Not Currently  . Drug use: No  Colonoscopy:  PSA:  Bone density:   No Known Allergies  Current Outpatient Medications  Medication Sig Dispense Refill  . allopurinol (ZYLOPRIM) 300 MG tablet Take 1 tablet (300 mg total) by mouth daily. 90 tablet 4  . amLODipine (NORVASC) 10 MG tablet Take 10 mg by mouth daily.    Marland Kitchen atorvastatin (LIPITOR) 10 MG tablet Take 10 mg by mouth daily.    . Insulin  Glargine, 2 Unit Dial, (TOUJEO MAX SOLOSTAR) 300 UNIT/ML SOPN Inject 12 Units into the skin daily.     . JENTADUETO 2.03-999 MG TABS Take 1 tablet by mouth daily with supper.   3  . metFORMIN (GLUCOPHAGE) 1000 MG tablet Take 1,000 mg by mouth 2 (two) times daily.     . ondansetron (ZOFRAN) 4 MG tablet Take 1 tablet (4 mg total) by mouth every 8 (eight) hours as needed for nausea or vomiting. 20 tablet 0  . ONETOUCH DELICA LANCETS FINE MISC 3 (three) times daily.   5  . telmisartan-hydrochlorothiazide (MICARDIS HCT) 80-25 MG tablet Take 1 tablet by mouth daily.     No current facility-administered medications for this visit.     OBJECTIVE:  Vitals:   11/29/18 0925  BP: (!) 146/80  Pulse: 98  Resp: 18  Temp: 97.9 F (36.6 C)  SpO2: 99%     Body mass index is 30.77 kg/m.   Wt Readings from Last 3 Encounters:  11/29/18 226 lb 14.4 oz (102.9 kg)  11/22/18 224 lb 4 oz (101.7 kg)  11/05/18 228 lb 11.2 oz (103.7 kg)      ECOG FS:0 - Asymptomatic GENERAL: Patient is a well appearing older male in no acute distress HEENT:  Sclerae anicteric.  Oropharynx clear and moist. No ulcerations or evidence of oropharyngeal candidiasis. Neck is supple.  NODES:  No cervical, supraclavicular, or axillary lymphadenopathy palpated.  LUNGS:  Clear to auscultation bilaterally.  No wheezes or rhonchi. HEART:  Regular rate and rhythm. No murmur appreciated. ABDOMEN:  Soft, nontender.  Positive, normoactive bowel sounds. No organomegaly palpated. EXTREMITIES:  No peripheral edema.   SKIN:  Clear with no obvious rashes or skin changes. No nail dyscrasia. NEURO:  Nonfocal. Well oriented.  Cognitively impaired.      LAB RESULTS:  CMP     Component Value Date/Time   NA 140 11/29/2018 0910   K 4.4 11/29/2018 0910   CL 103 11/29/2018 0910   CO2 24 11/29/2018 0910   GLUCOSE 220 (H) 11/29/2018 0910   BUN 27 (H) 11/29/2018 0910   CREATININE 1.67 (H) 11/29/2018 0910   CALCIUM 9.8 11/29/2018 0910    PROT 7.8 11/29/2018 0910   ALBUMIN 4.2 11/29/2018 0910   AST 17 11/29/2018 0910   ALT 42 11/29/2018 0910   ALKPHOS 88 11/29/2018 0910   BILITOT 0.8 11/29/2018 0910   GFRNONAA 43 (L) 11/29/2018 0910   GFRAA 49 (L) 11/29/2018 0910    No results found for: TOTALPROTELP, ALBUMINELP, A1GS, A2GS, BETS, BETA2SER, GAMS, MSPIKE, SPEI  No results found for: KPAFRELGTCHN, LAMBDASER, KAPLAMBRATIO  Lab Results  Component Value Date   WBC 11.6 (H) 11/29/2018   NEUTROABS 6.5 11/29/2018   HGB 12.8 (L) 11/29/2018   HCT 41.0 11/29/2018   MCV 85.6 11/29/2018   PLT 123 (L) 11/29/2018      Chemistry      Component Value Date/Time   NA 140 11/29/2018 0910   K 4.4 11/29/2018 0910   CL 103 11/29/2018 0910   CO2 24 11/29/2018 0910  BUN 27 (H) 11/29/2018 0910   CREATININE 1.67 (H) 11/29/2018 0910      Component Value Date/Time   CALCIUM 9.8 11/29/2018 0910   ALKPHOS 88 11/29/2018 0910   AST 17 11/29/2018 0910   ALT 42 11/29/2018 0910   BILITOT 0.8 11/29/2018 0910       No results found for: LABCA2  No components found for: UYQIHK742  No results for input(s): INR in the last 168 hours.  No results found for: LABCA2  No results found for: VZD638  No results found for: VFI433  No results found for: IRJ188  No results found for: CA2729  No components found for: HGQUANT  No results found for: CEA1 / No results found for: CEA1   No results found for: AFPTUMOR  No results found for: CHROMOGRNA  No results found for: PSA1  Appointment on 11/29/2018  Component Date Value Ref Range Status  . Sodium 11/29/2018 140  135 - 145 mmol/L Final  . Potassium 11/29/2018 4.4  3.5 - 5.1 mmol/L Final  . Chloride 11/29/2018 103  98 - 111 mmol/L Final  . CO2 11/29/2018 24  22 - 32 mmol/L Final  . Glucose, Bld 11/29/2018 220* 70 - 99 mg/dL Final  . BUN 11/29/2018 27* 8 - 23 mg/dL Final  . Creatinine, Ser 11/29/2018 1.67* 0.61 - 1.24 mg/dL Final  . Calcium 11/29/2018 9.8  8.9 - 10.3 mg/dL  Final  . Total Protein 11/29/2018 7.8  6.5 - 8.1 g/dL Final  . Albumin 11/29/2018 4.2  3.5 - 5.0 g/dL Final  . AST 11/29/2018 17  15 - 41 U/L Final  . ALT 11/29/2018 42  0 - 44 U/L Final  . Alkaline Phosphatase 11/29/2018 88  38 - 126 U/L Final  . Total Bilirubin 11/29/2018 0.8  0.3 - 1.2 mg/dL Final  . GFR calc non Af Amer 11/29/2018 43* >60 mL/min Final  . GFR calc Af Amer 11/29/2018 49* >60 mL/min Final  . Anion gap 11/29/2018 13  5 - 15 Final   Performed at [facility], [place] [address] [place], [place] [address]  . WBC 11/29/2018 11.6* 4.0 - 10.5 K/uL Final  . RBC 11/29/2018 4.79  4.22 - 5.81 MIL/uL Final  . Hemoglobin 11/29/2018 12.8* 13.0 - 17.0 g/dL Final  . HCT 11/29/2018 41.0  39.0 - 52.0 % Final  . MCV 11/29/2018 85.6  80.0 - 100.0 fL Final  . MCH 11/29/2018 26.7  26.0 - 34.0 pg Final  . MCHC 11/29/2018 31.2  30.0 - 36.0 g/dL Final  . RDW 11/29/2018 14.4  11.5 - 15.5 % Final  . Platelets 11/29/2018 123* 150 - 400 K/uL Final  . nRBC 11/29/2018 0.0  0.0 - 0.2 % Final  . Neutrophils Relative % 11/29/2018 56  % Final  . Neutro Abs 11/29/2018 6.5  1.7 - 7.7 K/uL Final  . Lymphocytes Relative 11/29/2018 38  % Final  . Lymphs Abs 11/29/2018 4.4* 0.7 - 4.0 K/uL Final  . Monocytes Relative 11/29/2018 3  % Final  . Monocytes Absolute 11/29/2018 0.4  0.1 - 1.0 K/uL Final  . Eosinophils Relative 11/29/2018 1  % Final  . Eosinophils Absolute 11/29/2018 0.2  0.0 - 0.5 K/uL Final  . Basophils Relative 11/29/2018 0  % Final  . Basophils Absolute 11/29/2018 0.0  0.0 - 0.1 K/uL Final  . Immature Granulocytes 11/29/2018 2  % Final  . Abs Immature Granulocytes 11/29/2018 0.26* 0.00 - 0.07 K/uL Final   Performed at  [facility], [place] [address], [place], [place] [address]    (this displays the last labs from the last 3 days)  No results found for: TOTALPROTELP, ALBUMINELP, A1GS, A2GS, BETS, BETA2SER, GAMS, MSPIKE, SPEI (this displays SPEP  labs)  No results found for: KPAFRELGTCHN, LAMBDASER, KAPLAMBRATIO (kappa/lambda light chains)  No results found for: HGBA, HGBA2QUANT, HGBFQUANT, HGBSQUAN (Hemoglobinopathy evaluation)   Lab Results  Component Value Date   LDH 218 (H) 11/15/2018    No results found for: IRON, TIBC, IRONPCTSAT (Iron and TIBC)  No results found for: FERRITIN  Urinalysis    Component Value Date/Time   COLORURINE STRAW (A) 11/23/2018 2018   APPEARANCEUR CLEAR 11/23/2018 2018   LABSPEC 1.020 11/23/2018 2018   PHURINE 5.0 11/23/2018 2018   GLUCOSEU >=500 (A) 11/23/2018 2018   [place] NEGATIVE 11/23/2018 2018   [place] NEGATIVE 11/23/2018 2018   [place] 11/23/2018 2018   PROTEINUR NEGATIVE 11/23/2018 2018   NITRITE NEGATIVE 11/23/2018 2018   LEUKOCYTESUR NEGATIVE 11/23/2018 2018     STUDIES: No results found.  ELIGIBLE FOR AVAILABLE RESEARCH PROTOCOL: no  ASSESSMENT: 78 y.o. [place], [place] man with a diagnosis of chronic lymphoid leukemia made 11/04/2018   1.  Starting Rituximab weekly x 8 weeks beginning 11/23/2018  PLAN:  Cody [name] is doing well today. He tolerated the first dose of weekly Rituximab well, with the exception of hyperglycemia. Dr. Jana Hakim reviewed this episode and has discontinued premedication with Dexamethasone from the treatment plan.  He will proceed with weekly Rituximab.    Billy's CBC shows a great response to the Rituximab. His WBC went from 145.9 to 91.3 last week to 11.6 today.  I reviewed that with his sister Wray Kearns and him in detail.  They are delighted with the news.    There was a question sent on mychart about whether or not he should dispose of cat litter.  I recommended against this.    I requested scheduling of upcoming appointments and reviewed that for the next two treatments he will not have a visit with Dr. Jana Hakim or myself, however we are happy to see him if there are any persistent issues or concerns that arise.    Cody [name] will return for  weekly rituximab and will f/u with Dr. Jana Hakim on 12/20/2018.  He and his sister know to call for any questions that may arise between now and then.  A total of (30) minutes of face-to-face time was spent with this patient with greater than 50% of that time in counseling and care-coordination.   Scot Dock, NP   11/29/2018 9:59 AM Medical Oncology and Hematology [facility] [address] [place], [place] [address] Tel. [phone]    Fax. [phone]

## 2018-11-29 NOTE — Patient Instructions (Signed)
[facility] Discharge Instructions for Patients Receiving Chemotherapy  Today you received the following chemotherapy agents:  Rituxan.  To help prevent nausea and vomiting after your treatment, we encourage you to take your nausea medication as directed.   If you develop nausea and vomiting that is not controlled by your nausea medication, call the clinic.   BELOW ARE SYMPTOMS THAT SHOULD BE REPORTED IMMEDIATELY:  *FEVER GREATER THAN 100.5 F  *CHILLS WITH OR WITHOUT FEVER  NAUSEA AND VOMITING THAT IS NOT CONTROLLED WITH YOUR NAUSEA MEDICATION  *UNUSUAL SHORTNESS OF BREATH  *UNUSUAL BRUISING OR BLEEDING  TENDERNESS IN MOUTH AND THROAT WITH OR WITHOUT PRESENCE OF ULCERS  *URINARY PROBLEMS  *BOWEL PROBLEMS  UNUSUAL RASH Items with * indicate a potential emergency and should be followed up as soon as possible.  Feel free to call the clinic should you have any questions or concerns. The clinic phone number is [phone].  Please show the CHEMO ALERT CARD at check-in to the Emergency Department and triage nurse.   

## 2018-12-02 ENCOUNTER — Encounter: Payer: Self-pay | Admitting: Oncology

## 2018-12-02 NOTE — Progress Notes (Signed)
Met w/pt's sister to introduce myself as her brother's Arboriculturist and to discuss copay assistance.  Since pt has special needs she takes care of all of his affairs, so she gave me consent to apply is his behalf, so I enrolled him in the [place] program for Rituxan for $25,000 for 12 months from1/9/20.  His responsibility will be as little as $5 copay per infusion. I also informed her of the [place], went over what it covers and gave her an expense sheet.  She declined the grant at this time.  I gave her mycard for any questions or concerns she may have in the future.

## 2018-12-06 ENCOUNTER — Inpatient Hospital Stay: Payer: 59

## 2018-12-06 VITALS — BP 155/87 | HR 79 | Temp 97.7°F | Resp 20 | Wt 228.0 lb

## 2018-12-06 DIAGNOSIS — C9110 Chronic lymphocytic leukemia of B-cell type not having achieved remission: Secondary | ICD-10-CM

## 2018-12-06 DIAGNOSIS — Z5112 Encounter for antineoplastic immunotherapy: Secondary | ICD-10-CM | POA: Diagnosis not present

## 2018-12-06 LAB — COMPREHENSIVE METABOLIC PANEL
ALT: 32 U/L (ref 0–44)
AST: 18 U/L (ref 15–41)
Albumin: 4.2 g/dL (ref 3.5–5.0)
Alkaline Phosphatase: 83 U/L (ref 38–126)
Anion gap: 11 (ref 5–15)
BUN: 22 mg/dL (ref 8–23)
CO2: 25 mmol/L (ref 22–32)
Calcium: 9.1 mg/dL (ref 8.9–10.3)
Chloride: 103 mmol/L (ref 98–111)
Creatinine, Ser: 1.57 mg/dL — ABNORMAL HIGH (ref 0.61–1.24)
GFR calc Af Amer: 53 mL/min — ABNORMAL LOW (ref >60)
GFR calc non Af Amer: 46 mL/min — ABNORMAL LOW (ref >60)
Glucose, Bld: 241 mg/dL — ABNORMAL HIGH (ref 70–99)
Potassium: 4.3 mmol/L (ref 3.5–5.1)
Sodium: 139 mmol/L (ref 135–145)
Total Bilirubin: 0.9 mg/dL (ref 0.3–1.2)
Total Protein: 7.6 g/dL (ref 6.5–8.1)

## 2018-12-06 LAB — CBC WITH DIFFERENTIAL/PLATELET
Abs Immature Granulocytes: 0.09 10*3/uL — ABNORMAL HIGH (ref 0.00–0.07)
Basophils Absolute: 0 10*3/uL (ref 0.0–0.1)
Basophils Relative: 0 %
Eosinophils Absolute: 0.1 10*3/uL (ref 0.0–0.5)
Eosinophils Relative: 1 %
HCT: 41.2 % (ref 39.0–52.0)
Hemoglobin: 12.6 g/dL — ABNORMAL LOW (ref 13.0–17.0)
Immature Granulocytes: 1 %
Lymphocytes Relative: 26 %
Lymphs Abs: 2.4 10*3/uL (ref 0.7–4.0)
MCH: 26.4 pg (ref 26.0–34.0)
MCHC: 30.6 g/dL (ref 30.0–36.0)
MCV: 86.2 fL (ref 80.0–100.0)
Monocytes Absolute: 0.4 10*3/uL (ref 0.1–1.0)
Monocytes Relative: 4 %
Neutro Abs: 6 10*3/uL (ref 1.7–7.7)
Neutrophils Relative %: 68 %
Platelets: 122 10*3/uL — ABNORMAL LOW (ref 150–400)
RBC: 4.78 MIL/uL (ref 4.22–5.81)
RDW: 14.2 % (ref 11.5–15.5)
WBC: 9 10*3/uL (ref 4.0–10.5)
nRBC: 0 % (ref 0.0–0.2)

## 2018-12-06 LAB — LACTATE DEHYDROGENASE: LDH: 143 U/L (ref 98–192)

## 2018-12-06 MED ORDER — ACETAMINOPHEN 325 MG PO TABS
ORAL_TABLET | ORAL | Status: AC
  Filled 2018-12-06: qty 2

## 2018-12-06 MED ORDER — DIPHENHYDRAMINE HCL 25 MG PO CAPS
ORAL_CAPSULE | ORAL | Status: AC
  Filled 2018-12-06: qty 1

## 2018-12-06 MED ORDER — FAMOTIDINE IN NACL 20-0.9 MG/50ML-% IV SOLN
20.0000 mg | Freq: Once | INTRAVENOUS | Status: AC
  Administered 2018-12-06: 20 mg via INTRAVENOUS

## 2018-12-06 MED ORDER — FAMOTIDINE IN NACL 20-0.9 MG/50ML-% IV SOLN
INTRAVENOUS | Status: AC
  Filled 2018-12-06: qty 50

## 2018-12-06 MED ORDER — SODIUM CHLORIDE 0.9 % IV SOLN
Freq: Once | INTRAVENOUS | Status: AC
  Administered 2018-12-06: 09:00:00 via INTRAVENOUS
  Filled 2018-12-06: qty 250

## 2018-12-06 MED ORDER — ACETAMINOPHEN 325 MG PO TABS
650.0000 mg | ORAL_TABLET | Freq: Once | ORAL | Status: AC
  Administered 2018-12-06: 650 mg via ORAL

## 2018-12-06 MED ORDER — SODIUM CHLORIDE 0.9 % IV SOLN
375.0000 mg/m2 | Freq: Once | INTRAVENOUS | Status: AC
  Administered 2018-12-06: 900 mg via INTRAVENOUS
  Filled 2018-12-06: qty 40

## 2018-12-06 MED ORDER — DIPHENHYDRAMINE HCL 25 MG PO CAPS
25.0000 mg | ORAL_CAPSULE | Freq: Once | ORAL | Status: AC
  Administered 2018-12-06: 25 mg via ORAL

## 2018-12-06 NOTE — Patient Instructions (Signed)
[facility] Discharge Instructions for Patients Receiving Chemotherapy  Today you received the following chemotherapy agents Rituximab (RITUXAN).  To help prevent nausea and vomiting after your treatment, we encourage you to take your nausea medication as prescribed.   If you develop nausea and vomiting that is not controlled by your nausea medication, call the clinic.   BELOW ARE SYMPTOMS THAT SHOULD BE REPORTED IMMEDIATELY:  *FEVER GREATER THAN 100.5 F  *CHILLS WITH OR WITHOUT FEVER  NAUSEA AND VOMITING THAT IS NOT CONTROLLED WITH YOUR NAUSEA MEDICATION  *UNUSUAL SHORTNESS OF BREATH  *UNUSUAL BRUISING OR BLEEDING  TENDERNESS IN MOUTH AND THROAT WITH OR WITHOUT PRESENCE OF ULCERS  *URINARY PROBLEMS  *BOWEL PROBLEMS  UNUSUAL RASH Items with * indicate a potential emergency and should be followed up as soon as possible.  Feel free to call the clinic should you have any questions or concerns. The clinic phone number is [phone].  Please show the CHEMO ALERT CARD at check-in to the Emergency Department and triage nurse.   

## 2018-12-06 NOTE — Progress Notes (Signed)
Per Dr. Jana Hakim, okay to treat patient with Crt level of 1.57.

## 2018-12-13 ENCOUNTER — Inpatient Hospital Stay: Payer: 59

## 2018-12-13 VITALS — BP 126/80 | HR 71 | Temp 98.2°F | Resp 17 | Wt 227.8 lb

## 2018-12-13 DIAGNOSIS — Z5112 Encounter for antineoplastic immunotherapy: Secondary | ICD-10-CM | POA: Diagnosis not present

## 2018-12-13 DIAGNOSIS — C9110 Chronic lymphocytic leukemia of B-cell type not having achieved remission: Secondary | ICD-10-CM

## 2018-12-13 LAB — COMPREHENSIVE METABOLIC PANEL
ALT: 42 U/L (ref 0–44)
AST: 24 U/L (ref 15–41)
Albumin: 4.2 g/dL (ref 3.5–5.0)
Alkaline Phosphatase: 93 U/L (ref 38–126)
Anion gap: 13 (ref 5–15)
BUN: 20 mg/dL (ref 8–23)
CO2: 23 mmol/L (ref 22–32)
Calcium: 9.5 mg/dL (ref 8.9–10.3)
Chloride: 104 mmol/L (ref 98–111)
Creatinine, Ser: 1.7 mg/dL — ABNORMAL HIGH (ref 0.61–1.24)
GFR calc Af Amer: 48 mL/min — ABNORMAL LOW (ref >60)
GFR calc non Af Amer: 42 mL/min — ABNORMAL LOW (ref >60)
Glucose, Bld: 239 mg/dL — ABNORMAL HIGH (ref 70–99)
Potassium: 4.2 mmol/L (ref 3.5–5.1)
Sodium: 140 mmol/L (ref 135–145)
Total Bilirubin: 0.9 mg/dL (ref 0.3–1.2)
Total Protein: 7.9 g/dL (ref 6.5–8.1)

## 2018-12-13 LAB — CBC WITH DIFFERENTIAL/PLATELET
Abs Immature Granulocytes: 0.03 10*3/uL (ref 0.00–0.07)
Basophils Absolute: 0 10*3/uL (ref 0.0–0.1)
Basophils Relative: 0 %
Eosinophils Absolute: 0.2 10*3/uL (ref 0.0–0.5)
Eosinophils Relative: 2 %
HCT: 40.8 % (ref 39.0–52.0)
Hemoglobin: 12.9 g/dL — ABNORMAL LOW (ref 13.0–17.0)
Immature Granulocytes: 0 %
Lymphocytes Relative: 31 %
Lymphs Abs: 2.1 10*3/uL (ref 0.7–4.0)
MCH: 26.7 pg (ref 26.0–34.0)
MCHC: 31.6 g/dL (ref 30.0–36.0)
MCV: 84.5 fL (ref 80.0–100.0)
Monocytes Absolute: 0.4 10*3/uL (ref 0.1–1.0)
Monocytes Relative: 6 %
Neutro Abs: 4 10*3/uL (ref 1.7–7.7)
Neutrophils Relative %: 61 %
Platelets: 115 10*3/uL — ABNORMAL LOW (ref 150–400)
RBC: 4.83 MIL/uL (ref 4.22–5.81)
RDW: 14.1 % (ref 11.5–15.5)
WBC: 6.7 10*3/uL (ref 4.0–10.5)
nRBC: 0 % (ref 0.0–0.2)

## 2018-12-13 LAB — LACTATE DEHYDROGENASE: LDH: 129 U/L (ref 98–192)

## 2018-12-13 LAB — URIC ACID: Uric Acid, Serum: 4.7 mg/dL (ref 3.7–8.6)

## 2018-12-13 MED ORDER — FAMOTIDINE IN NACL 20-0.9 MG/50ML-% IV SOLN
INTRAVENOUS | Status: AC
  Filled 2018-12-13: qty 50

## 2018-12-13 MED ORDER — ACETAMINOPHEN 325 MG PO TABS
650.0000 mg | ORAL_TABLET | Freq: Once | ORAL | Status: AC
  Administered 2018-12-13: 650 mg via ORAL

## 2018-12-13 MED ORDER — SODIUM CHLORIDE 0.9 % IV SOLN
Freq: Once | INTRAVENOUS | Status: AC
  Administered 2018-12-13: 09:00:00 via INTRAVENOUS
  Filled 2018-12-13: qty 250

## 2018-12-13 MED ORDER — DIPHENHYDRAMINE HCL 25 MG PO CAPS
ORAL_CAPSULE | ORAL | Status: AC
  Filled 2018-12-13: qty 1

## 2018-12-13 MED ORDER — FAMOTIDINE IN NACL 20-0.9 MG/50ML-% IV SOLN
20.0000 mg | Freq: Once | INTRAVENOUS | Status: AC
  Administered 2018-12-13: 20 mg via INTRAVENOUS

## 2018-12-13 MED ORDER — ACETAMINOPHEN 325 MG PO TABS
ORAL_TABLET | ORAL | Status: AC
  Filled 2018-12-13: qty 2

## 2018-12-13 MED ORDER — DIPHENHYDRAMINE HCL 25 MG PO CAPS
25.0000 mg | ORAL_CAPSULE | Freq: Once | ORAL | Status: AC
  Administered 2018-12-13: 25 mg via ORAL

## 2018-12-13 MED ORDER — SODIUM CHLORIDE 0.9 % IV SOLN
375.0000 mg/m2 | Freq: Once | INTRAVENOUS | Status: AC
  Administered 2018-12-13: 900 mg via INTRAVENOUS
  Filled 2018-12-13: qty 50

## 2018-12-13 NOTE — Progress Notes (Signed)
Okay to treat with Creat 1.70 per Dr. Lindi Adie. Received verbal order for uric acid level, contacted lab and Anderson Malta stated that can run from collected blood. Educated patient and sister for patient to drink more water.

## 2018-12-17 NOTE — Progress Notes (Signed)
[place]  [phone] [phone]) [phone]     ID: SLAYDE BRAULT DOB: Sep 18, 1956  MR#: [id number]  OAC#:[id number]  Patient Care Team: [name], MD as PCP - General (Family Medicine) Magrinat, Virgie Dad, MD as Consulting Physician (Oncology) Chauncey Cruel, MD OTHER MD:  CHIEF COMPLAINT: Chronic lymphoid leukemia  CURRENT TREATMENT: Rituximab   HISTORY OF CURRENT ILLNESS: From the original intake note:  [name] was evaluated by his primary care physician and found to have a high white cell count.  He was asked to go to the emergency room which the patient did on 11/04/2018 at 8 PM.  The patient was found to be stable, with a white cell count of greater than 150,000, most of which were lymphocytes.  A pathology smear review by Dr. Reuel Derby  showed lymphocytosis and smudge cells.  There were no blasts.  RBCs and platelets were unremarkable.  The patient was referred to our clinic for further evaluation  [name] subsequent history is as detailed below.   INTERVAL HISTORY: Cody [name] returns today for follow-up and treatment of his chronic lymphoid leukemia. He is accompanied by his sister, Manuella Ghazi.  She continues on rituximab.  His first dose was only a test dose, and it did cause a reaction.  He did fine after that and today is his fifth full dose of 8 planned, after which we will consider maintenance.   He tolerates treatments with no side effects that he is aware of.  After treatment, he goes home and sleeps.  He has had a very defying response to rituximab: Results for KALIEL, BOLDS" (MRN [id number]) as of 12/20/2018 11:40  Ref. Range 11/15/2018 10:49 11/29/2018 09:10 12/06/2018 07:48 12/13/2018 07:48 12/20/2018 11:22  Lymphocyte # Latest Ref Range: 0.7 - 4.0 K/uL 136.9 (H) 4.4 (H) 2.4 2.1 1.8   Since his last visit here, he has not undergone any additional studies.    REVIEW OF SYSTEMS: For exercise, Cody [name] has a  treadmill at home; he doesn't have a schedule, but his sister urges him to use it every day. The patient denies typical 'B' symptoms of fever, diaphoresis, weight loss, or fatigue. The patient denies unusual headaches, visual changes, nausea, vomiting, or dizziness. There has been no unusual cough, phlegm production, or pleurisy. This been no change in bowel or bladder habits. The patient denies bleeding or rash. A detailed review of systems was otherwise noncontributory.     PAST MEDICAL HISTORY: Past Medical History:  Diagnosis Date  . Diabetes mellitus without complication ([place])   Hypertension, hypercholesterolemia, cataracts, seasonal allergies, cirrhosis, history of EtOH abuse   PAST SURGICAL HISTORY: No past surgical history on file. Surgery for incarcerated bowel release   FAMILY HISTORY No family history on file.  The patient's father died from cirrhosis at age 66 in the setting of alcohol abuse; he also had significant coronary artery disease.  The patient's mother is living, 40 years old as of December 2019.  The patient has 2 sisters, no brothers.  They are not aware of any cancer history in the family   SOCIAL HISTORY:  The patient used to work in [employer] for the city of [place].  He was briefly married but the marriage has been annulled.  The patient currently lives with his sister Maxx Pham and their mother.  A second sister, Charma Igo, lives in [place].   ADVANCED DIRECTIVES: The patient Sister [name] is his healthcare power of attorney.  She can be  reached at [phone].   HEALTH MAINTENANCE: Social History   Tobacco Use  . Smoking status: Never Smoker  . Smokeless tobacco: Never Used  Substance Use Topics  . Alcohol use: Not Currently  . Drug use: No    Colonoscopy:  PSA:  Bone density:   No Known Allergies  Current Outpatient Medications  Medication Sig Dispense Refill  . allopurinol (ZYLOPRIM) 300 MG tablet Take 1 tablet  (300 mg total) by mouth daily. 90 tablet 4  . amLODipine (NORVASC) 10 MG tablet Take 10 mg by mouth daily.    Marland Kitchen atorvastatin (LIPITOR) 10 MG tablet Take 10 mg by mouth daily.    . Insulin Glargine, 2 Unit Dial, (TOUJEO MAX SOLOSTAR) 300 UNIT/ML SOPN Inject 12 Units into the skin daily.     . JENTADUETO 2.03-999 MG TABS Take 1 tablet by mouth daily with supper.   3  . metFORMIN (GLUCOPHAGE) 1000 MG tablet Take 1,000 mg by mouth 2 (two) times daily.     . ondansetron (ZOFRAN) 4 MG tablet Take 1 tablet (4 mg total) by mouth every 8 (eight) hours as needed for nausea or vomiting. 20 tablet 0  . ONETOUCH DELICA LANCETS FINE MISC 3 (three) times daily.   5  . telmisartan-hydrochlorothiazide (MICARDIS HCT) 80-25 MG tablet Take 1 tablet by mouth daily.     No current facility-administered medications for this visit.     OBJECTIVE: Middle-aged African-American man in no acute distress Vitals:   12/20/18 1134  BP: 125/76  Pulse: 85  Resp: 18  Temp: 98.5 F (36.9 C)  SpO2: 98%     Body mass index is 30.96 kg/m.   Wt Readings from Last 3 Encounters:  12/20/18 228 lb 4.8 oz (103.6 kg)  12/13/18 227 lb 12 oz (103.3 kg)  12/06/18 228 lb (103.4 kg)      ECOG FS:0 - Asymptomatic  Sclerae unicteric, EOMs intact No cervical or supraclavicular adenopathy, no axillary or inguinal adenopathy Lungs no rales or rhonchi Heart regular rate and rhythm Abd soft, nontender, positive bowel sounds, no palpable splenomegaly MSK no focal spinal tenderness Neuro: nonfocal, well oriented, appropriate affect    LAB RESULTS:  CMP     Component Value Date/Time   NA 140 12/13/2018 0748   K 4.2 12/13/2018 0748   CL 104 12/13/2018 0748   CO2 23 12/13/2018 0748   GLUCOSE 239 (H) 12/13/2018 0748   BUN 20 12/13/2018 0748   CREATININE 1.70 (H) 12/13/2018 0748   CALCIUM 9.5 12/13/2018 0748   PROT 7.9 12/13/2018 0748   ALBUMIN 4.2 12/13/2018 0748   AST 24 12/13/2018 0748   ALT 42 12/13/2018 0748    ALKPHOS 93 12/13/2018 0748   BILITOT 0.9 12/13/2018 0748   GFRNONAA 42 (L) 12/13/2018 0748   GFRAA 48 (L) 12/13/2018 0748    No results found for: TOTALPROTELP, ALBUMINELP, A1GS, A2GS, BETS, BETA2SER, GAMS, MSPIKE, SPEI  No results found for: Nils Pyle, [facility]  Lab Results  Component Value Date   WBC 5.7 12/20/2018   NEUTROABS 3.4 12/20/2018   HGB 12.4 (L) 12/20/2018   HCT 39.4 12/20/2018   MCV 85.5 12/20/2018   PLT 123 (L) 12/20/2018      Chemistry      Component Value Date/Time   NA 140 12/13/2018 0748   K 4.2 12/13/2018 0748   CL 104 12/13/2018 0748   CO2 23 12/13/2018 0748   BUN 20 12/13/2018 0748   CREATININE 1.70 (H) 12/13/2018 2725  Component Value Date/Time   CALCIUM 9.5 12/13/2018 0748   ALKPHOS 93 12/13/2018 0748   AST 24 12/13/2018 0748   ALT 42 12/13/2018 0748   BILITOT 0.9 12/13/2018 0748       No results found for: LABCA2  No components found for: [id number]  No results for input(s): INR in the last 168 hours.  No results found for: LABCA2  No results found for: QBH419  No results found for: FXT024  No results found for: OXB353  No results found for: CA2729  No components found for: HGQUANT  No results found for: CEA1 / No results found for: CEA1   No results found for: AFPTUMOR  No results found for: CHROMOGRNA  No results found for: PSA1  Appointment on 12/20/2018  Component Date Value Ref Range Status  . WBC 12/20/2018 5.7  4.0 - 10.5 K/uL Final  . RBC 12/20/2018 4.61  4.22 - 5.81 MIL/uL Final  . Hemoglobin 12/20/2018 12.4* 13.0 - 17.0 g/dL Final  . HCT 12/20/2018 39.4  39.0 - 52.0 % Final  . MCV 12/20/2018 85.5  80.0 - 100.0 fL Final  . MCH 12/20/2018 26.9  26.0 - 34.0 pg Final  . MCHC 12/20/2018 31.5  30.0 - 36.0 g/dL Final  . RDW 12/20/2018 13.9  11.5 - 15.5 % Final  . Platelets 12/20/2018 123* 150 - 400 K/uL Final  . nRBC 12/20/2018 0.0  0.0 - 0.2 % Final  . Neutrophils Relative % 12/20/2018 60   % Final  . Neutro Abs 12/20/2018 3.4  1.7 - 7.7 K/uL Final  . Lymphocytes Relative 12/20/2018 32  % Final  . Lymphs Abs 12/20/2018 1.8  0.7 - 4.0 K/uL Final  . Monocytes Relative 12/20/2018 4  % Final  . Monocytes Absolute 12/20/2018 0.2  0.1 - 1.0 K/uL Final  . Eosinophils Relative 12/20/2018 3  % Final  . Eosinophils Absolute 12/20/2018 0.2  0.0 - 0.5 K/uL Final  . Basophils Relative 12/20/2018 0  % Final  . Basophils Absolute 12/20/2018 0.0  0.0 - 0.1 K/uL Final  . Immature Granulocytes 12/20/2018 1  % Final  . Abs Immature Granulocytes 12/20/2018 0.04  0.00 - 0.07 K/uL Final   Performed at [facility], [place] [address], [place], [place] [address]    (this displays the last labs from the last 3 days)  No results found for: TOTALPROTELP, ALBUMINELP, A1GS, A2GS, BETS, BETA2SER, GAMS, MSPIKE, SPEI (this displays SPEP labs)  No results found for: KPAFRELGTCHN, LAMBDASER, KAPLAMBRATIO (kappa/lambda light chains)  No results found for: HGBA, HGBA2QUANT, HGBFQUANT, HGBSQUAN (Hemoglobinopathy evaluation)   Lab Results  Component Value Date   LDH 129 12/13/2018    No results found for: IRON, TIBC, IRONPCTSAT (Iron and TIBC)  No results found for: FERRITIN  Urinalysis    Component Value Date/Time   COLORURINE STRAW (A) 11/23/2018 2018   APPEARANCEUR CLEAR 11/23/2018 2018   LABSPEC 1.020 11/23/2018 2018   PHURINE 5.0 11/23/2018 2018   GLUCOSEU >=500 (A) 11/23/2018 2018   [place] 11/23/2018 2018   [place] NEGATIVE 11/23/2018 2018   [place] 11/23/2018 2018   PROTEINUR NEGATIVE 11/23/2018 2018   NITRITE NEGATIVE 11/23/2018 2018   LEUKOCYTESUR NEGATIVE 11/23/2018 2018     STUDIES: No results found.   ELIGIBLE FOR AVAILABLE RESEARCH PROTOCOL: no   ASSESSMENT: 63 y.o. [place], [place] man with a diagnosis of chronic lymphoid leukemia made 11/04/2018  (1) Starting Rituximab weekly x 9 weeks beginning 11/23/2018  (a) reaction to  first dose (which was 100 mg only).  (2) to start maintenance, every 56-month rituximab for 2 years, first dose 03/07/2019   PLAN:  Cody [name] is now tolerating the rituximab with no side effects that he can tell us about.  His sister also agrees that he is tolerating it very well.  He has had a remarkable response and currently his lymphocyte count is in the normal range.  His hemoglobin is 12.4 and his platelet count is 123,000, both still slightly low  The plan at this point is to complete the 8 full doses, which is today +3 more, and then switch to maintenance, with rituximab every 2 months for the next 2 years.  After that we will probably start observation  We are continuing the allopurinol indefinitely  I have encouraged him to use his exercise machine on a daily basis  They know to call for any other issues that may develop before the next visit.    Magrinat, Virgie Dad, MD  12/20/18 11:51 AM Medical Oncology and Hematology [facility] [address] [place], [place] [address] Tel. [phone]    Fax. [phone]  I, Jacqualyn Posey am acting as a Education administrator for Chauncey Cruel, MD.   I, Lurline Del MD, have reviewed the above documentation for accuracy and completeness, and I agree with the above.

## 2018-12-20 ENCOUNTER — Inpatient Hospital Stay: Payer: 59

## 2018-12-20 ENCOUNTER — Telehealth: Payer: Self-pay | Admitting: Oncology

## 2018-12-20 ENCOUNTER — Telehealth: Payer: Self-pay | Admitting: *Deleted

## 2018-12-20 ENCOUNTER — Inpatient Hospital Stay (HOSPITAL_BASED_OUTPATIENT_CLINIC_OR_DEPARTMENT_OTHER): Payer: 59 | Admitting: Oncology

## 2018-12-20 VITALS — BP 125/76 | HR 85 | Temp 98.5°F | Resp 18 | Ht 72.0 in | Wt 228.3 lb

## 2018-12-20 VITALS — BP 120/74 | HR 70 | Temp 98.8°F | Resp 18

## 2018-12-20 DIAGNOSIS — C9110 Chronic lymphocytic leukemia of B-cell type not having achieved remission: Secondary | ICD-10-CM | POA: Diagnosis not present

## 2018-12-20 DIAGNOSIS — Z5112 Encounter for antineoplastic immunotherapy: Secondary | ICD-10-CM | POA: Diagnosis not present

## 2018-12-20 LAB — CBC WITH DIFFERENTIAL/PLATELET
Abs Immature Granulocytes: 0.04 10*3/uL (ref 0.00–0.07)
Basophils Absolute: 0 10*3/uL (ref 0.0–0.1)
Basophils Relative: 0 %
Eosinophils Absolute: 0.2 10*3/uL (ref 0.0–0.5)
Eosinophils Relative: 3 %
HCT: 39.4 % (ref 39.0–52.0)
Hemoglobin: 12.4 g/dL — ABNORMAL LOW (ref 13.0–17.0)
Immature Granulocytes: 1 %
Lymphocytes Relative: 32 %
Lymphs Abs: 1.8 10*3/uL (ref 0.7–4.0)
MCH: 26.9 pg (ref 26.0–34.0)
MCHC: 31.5 g/dL (ref 30.0–36.0)
MCV: 85.5 fL (ref 80.0–100.0)
Monocytes Absolute: 0.2 10*3/uL (ref 0.1–1.0)
Monocytes Relative: 4 %
Neutro Abs: 3.4 10*3/uL (ref 1.7–7.7)
Neutrophils Relative %: 60 %
Platelets: 123 10*3/uL — ABNORMAL LOW (ref 150–400)
RBC: 4.61 MIL/uL (ref 4.22–5.81)
RDW: 13.9 % (ref 11.5–15.5)
WBC: 5.7 10*3/uL (ref 4.0–10.5)
nRBC: 0 % (ref 0.0–0.2)

## 2018-12-20 LAB — LACTATE DEHYDROGENASE: LDH: 162 U/L (ref 98–192)

## 2018-12-20 LAB — COMPREHENSIVE METABOLIC PANEL
ALT: 50 U/L — ABNORMAL HIGH (ref 0–44)
AST: 31 U/L (ref 15–41)
Albumin: 4.2 g/dL (ref 3.5–5.0)
Alkaline Phosphatase: 88 U/L (ref 38–126)
Anion gap: 9 (ref 5–15)
BUN: 21 mg/dL (ref 8–23)
CO2: 27 mmol/L (ref 22–32)
Calcium: 8.6 mg/dL — ABNORMAL LOW (ref 8.9–10.3)
Chloride: 101 mmol/L (ref 98–111)
Creatinine, Ser: 1.69 mg/dL — ABNORMAL HIGH (ref 0.61–1.24)
GFR calc Af Amer: 49 mL/min — ABNORMAL LOW (ref >60)
GFR calc non Af Amer: 42 mL/min — ABNORMAL LOW (ref >60)
Glucose, Bld: 334 mg/dL — ABNORMAL HIGH (ref 70–99)
Potassium: 4.4 mmol/L (ref 3.5–5.1)
Sodium: 137 mmol/L (ref 135–145)
Total Bilirubin: 1 mg/dL (ref 0.3–1.2)
Total Protein: 7.6 g/dL (ref 6.5–8.1)

## 2018-12-20 MED ORDER — DIPHENHYDRAMINE HCL 25 MG PO CAPS
ORAL_CAPSULE | ORAL | Status: AC
  Filled 2018-12-20: qty 1

## 2018-12-20 MED ORDER — FAMOTIDINE IN NACL 20-0.9 MG/50ML-% IV SOLN
20.0000 mg | Freq: Once | INTRAVENOUS | Status: AC
  Administered 2018-12-20: 20 mg via INTRAVENOUS

## 2018-12-20 MED ORDER — DIPHENHYDRAMINE HCL 25 MG PO CAPS
25.0000 mg | ORAL_CAPSULE | Freq: Once | ORAL | Status: AC
  Administered 2018-12-20: 25 mg via ORAL

## 2018-12-20 MED ORDER — ACETAMINOPHEN 325 MG PO TABS
650.0000 mg | ORAL_TABLET | Freq: Once | ORAL | Status: AC
  Administered 2018-12-20: 650 mg via ORAL

## 2018-12-20 MED ORDER — ACETAMINOPHEN 325 MG PO TABS
ORAL_TABLET | ORAL | Status: AC
  Filled 2018-12-20: qty 2

## 2018-12-20 MED ORDER — FAMOTIDINE IN NACL 20-0.9 MG/50ML-% IV SOLN
INTRAVENOUS | Status: AC
  Filled 2018-12-20: qty 50

## 2018-12-20 MED ORDER — SODIUM CHLORIDE 0.9 % IV SOLN
Freq: Once | INTRAVENOUS | Status: AC
  Administered 2018-12-20: 13:00:00 via INTRAVENOUS
  Filled 2018-12-20: qty 250

## 2018-12-20 MED ORDER — SODIUM CHLORIDE 0.9 % IV SOLN
375.0000 mg/m2 | Freq: Once | INTRAVENOUS | Status: AC
  Administered 2018-12-20: 900 mg via INTRAVENOUS
  Filled 2018-12-20: qty 50

## 2018-12-20 NOTE — Progress Notes (Signed)
Per Dr. Jana Hakim, okay to treat patient with current Scr. Also, patient encouraged to re-check blood glucose when he gets home and treat appropriately with his home meds.

## 2018-12-20 NOTE — Telephone Encounter (Signed)
Proceed with treatment today - despite creatinine and glucose.  Verify pt has taken home diabetic medications as well as pt should recheck and treat accordingly.

## 2018-12-20 NOTE — Patient Instructions (Signed)
[facility] Discharge Instructions for Patients Receiving Chemotherapy  Today you received the following chemotherapy agents Rituximab (RITUXAN).  To help prevent nausea and vomiting after your treatment, we encourage you to take your nausea medication as prescribed.   If you develop nausea and vomiting that is not controlled by your nausea medication, call the clinic.   BELOW ARE SYMPTOMS THAT SHOULD BE REPORTED IMMEDIATELY:  *FEVER GREATER THAN 100.5 F  *CHILLS WITH OR WITHOUT FEVER  NAUSEA AND VOMITING THAT IS NOT CONTROLLED WITH YOUR NAUSEA MEDICATION  *UNUSUAL SHORTNESS OF BREATH  *UNUSUAL BRUISING OR BLEEDING  TENDERNESS IN MOUTH AND THROAT WITH OR WITHOUT PRESENCE OF ULCERS  *URINARY PROBLEMS  *BOWEL PROBLEMS  UNUSUAL RASH Items with * indicate a potential emergency and should be followed up as soon as possible.  Feel free to call the clinic should you have any questions or concerns. The clinic phone number is [phone].  Please show the CHEMO ALERT CARD at check-in to the Emergency Department and triage nurse.   

## 2018-12-20 NOTE — Telephone Encounter (Signed)
Gave avs and calendar ° °

## 2018-12-27 ENCOUNTER — Inpatient Hospital Stay: Payer: 59 | Attending: Oncology

## 2018-12-27 ENCOUNTER — Inpatient Hospital Stay: Payer: 59

## 2018-12-27 VITALS — BP 124/74 | HR 70 | Temp 98.0°F | Resp 17

## 2018-12-27 DIAGNOSIS — C9110 Chronic lymphocytic leukemia of B-cell type not having achieved remission: Secondary | ICD-10-CM | POA: Diagnosis present

## 2018-12-27 DIAGNOSIS — Z5112 Encounter for antineoplastic immunotherapy: Secondary | ICD-10-CM | POA: Insufficient documentation

## 2018-12-27 LAB — CBC WITH DIFFERENTIAL/PLATELET
Abs Immature Granulocytes: 0.09 10*3/uL — ABNORMAL HIGH (ref 0.00–0.07)
Basophils Absolute: 0 10*3/uL (ref 0.0–0.1)
Basophils Relative: 0 %
Eosinophils Absolute: 0.2 10*3/uL (ref 0.0–0.5)
Eosinophils Relative: 3 %
HCT: 40.7 % (ref 39.0–52.0)
Hemoglobin: 12.9 g/dL — ABNORMAL LOW (ref 13.0–17.0)
Immature Granulocytes: 1 %
Lymphocytes Relative: 32 %
Lymphs Abs: 2.3 10*3/uL (ref 0.7–4.0)
MCH: 27.1 pg (ref 26.0–34.0)
MCHC: 31.7 g/dL (ref 30.0–36.0)
MCV: 85.5 fL (ref 80.0–100.0)
Monocytes Absolute: 0.4 10*3/uL (ref 0.1–1.0)
Monocytes Relative: 5 %
Neutro Abs: 4.2 10*3/uL (ref 1.7–7.7)
Neutrophils Relative %: 59 %
Platelets: 124 10*3/uL — ABNORMAL LOW (ref 150–400)
RBC: 4.76 MIL/uL (ref 4.22–5.81)
RDW: 13.8 % (ref 11.5–15.5)
WBC: 7.1 10*3/uL (ref 4.0–10.5)
nRBC: 0 % (ref 0.0–0.2)

## 2018-12-27 LAB — COMPREHENSIVE METABOLIC PANEL
ALT: 42 U/L (ref 0–44)
AST: 24 U/L (ref 15–41)
Albumin: 4.4 g/dL (ref 3.5–5.0)
Alkaline Phosphatase: 84 U/L (ref 38–126)
Anion gap: 13 (ref 5–15)
BUN: 32 mg/dL — ABNORMAL HIGH (ref 8–23)
CO2: 23 mmol/L (ref 22–32)
Calcium: 9 mg/dL (ref 8.9–10.3)
Chloride: 105 mmol/L (ref 98–111)
Creatinine, Ser: 1.79 mg/dL — ABNORMAL HIGH (ref 0.61–1.24)
GFR calc Af Amer: 45 mL/min — ABNORMAL LOW (ref >60)
GFR calc non Af Amer: 39 mL/min — ABNORMAL LOW (ref >60)
Glucose, Bld: 144 mg/dL — ABNORMAL HIGH (ref 70–99)
Potassium: 4.3 mmol/L (ref 3.5–5.1)
Sodium: 141 mmol/L (ref 135–145)
Total Bilirubin: 0.7 mg/dL (ref 0.3–1.2)
Total Protein: 7.7 g/dL (ref 6.5–8.1)

## 2018-12-27 LAB — LACTATE DEHYDROGENASE: LDH: 178 U/L (ref 98–192)

## 2018-12-27 MED ORDER — DIPHENHYDRAMINE HCL 50 MG/ML IJ SOLN
INTRAMUSCULAR | Status: AC
  Filled 2018-12-27: qty 1

## 2018-12-27 MED ORDER — DIPHENHYDRAMINE HCL 25 MG PO CAPS
ORAL_CAPSULE | ORAL | Status: AC
  Filled 2018-12-27: qty 1

## 2018-12-27 MED ORDER — ACETAMINOPHEN 325 MG PO TABS
650.0000 mg | ORAL_TABLET | Freq: Once | ORAL | Status: AC
  Administered 2018-12-27: 650 mg via ORAL

## 2018-12-27 MED ORDER — FAMOTIDINE IN NACL 20-0.9 MG/50ML-% IV SOLN
20.0000 mg | Freq: Once | INTRAVENOUS | Status: AC
  Administered 2018-12-27: 20 mg via INTRAVENOUS

## 2018-12-27 MED ORDER — DIPHENHYDRAMINE HCL 25 MG PO CAPS
25.0000 mg | ORAL_CAPSULE | Freq: Once | ORAL | Status: AC
  Administered 2018-12-27: 25 mg via ORAL

## 2018-12-27 MED ORDER — ACETAMINOPHEN 325 MG PO TABS
ORAL_TABLET | ORAL | Status: AC
  Filled 2018-12-27: qty 2

## 2018-12-27 MED ORDER — SODIUM CHLORIDE 0.9 % IV SOLN
Freq: Once | INTRAVENOUS | Status: AC
  Administered 2018-12-27: 09:00:00 via INTRAVENOUS
  Filled 2018-12-27: qty 250

## 2018-12-27 MED ORDER — FAMOTIDINE IN NACL 20-0.9 MG/50ML-% IV SOLN
INTRAVENOUS | Status: AC
  Filled 2018-12-27: qty 50

## 2018-12-27 MED ORDER — SODIUM CHLORIDE 0.9 % IV SOLN
375.0000 mg/m2 | Freq: Once | INTRAVENOUS | Status: AC
  Administered 2018-12-27: 900 mg via INTRAVENOUS
  Filled 2018-12-27: qty 50

## 2018-12-27 NOTE — Patient Instructions (Signed)
[facility] Discharge Instructions for Patients Receiving Chemotherapy  Today you received the following chemotherapy agents Rituximab (RITUXAN).  To help prevent nausea and vomiting after your treatment, we encourage you to take your nausea medication as prescribed.   If you develop nausea and vomiting that is not controlled by your nausea medication, call the clinic.   BELOW ARE SYMPTOMS THAT SHOULD BE REPORTED IMMEDIATELY:  *FEVER GREATER THAN 100.5 F  *CHILLS WITH OR WITHOUT FEVER  NAUSEA AND VOMITING THAT IS NOT CONTROLLED WITH YOUR NAUSEA MEDICATION  *UNUSUAL SHORTNESS OF BREATH  *UNUSUAL BRUISING OR BLEEDING  TENDERNESS IN MOUTH AND THROAT WITH OR WITHOUT PRESENCE OF ULCERS  *URINARY PROBLEMS  *BOWEL PROBLEMS  UNUSUAL RASH Items with * indicate a potential emergency and should be followed up as soon as possible.  Feel free to call the clinic should you have any questions or concerns. The clinic phone number is [phone].  Please show the CHEMO ALERT CARD at check-in to the Emergency Department and triage nurse.   

## 2018-12-27 NOTE — Progress Notes (Signed)
Per Dr. Jana Hakim, patient is OK to treat with today's labs.

## 2019-01-03 ENCOUNTER — Inpatient Hospital Stay: Payer: 59

## 2019-01-03 VITALS — BP 133/80 | HR 67 | Temp 98.1°F | Resp 16

## 2019-01-03 DIAGNOSIS — Z5112 Encounter for antineoplastic immunotherapy: Secondary | ICD-10-CM | POA: Diagnosis not present

## 2019-01-03 DIAGNOSIS — C9110 Chronic lymphocytic leukemia of B-cell type not having achieved remission: Secondary | ICD-10-CM | POA: Diagnosis not present

## 2019-01-03 LAB — COMPREHENSIVE METABOLIC PANEL
ALT: 31 U/L (ref 0–44)
AST: 24 U/L (ref 15–41)
Albumin: 4.4 g/dL (ref 3.5–5.0)
Alkaline Phosphatase: 82 U/L (ref 38–126)
Anion gap: 9 (ref 5–15)
BUN: 18 mg/dL (ref 8–23)
CO2: 26 mmol/L (ref 22–32)
Calcium: 9.1 mg/dL (ref 8.9–10.3)
Chloride: 103 mmol/L (ref 98–111)
Creatinine, Ser: 1.43 mg/dL — ABNORMAL HIGH (ref 0.61–1.24)
GFR calc Af Amer: 60 mL/min — ABNORMAL LOW (ref >60)
GFR calc non Af Amer: 51 mL/min — ABNORMAL LOW (ref >60)
Glucose, Bld: 188 mg/dL — ABNORMAL HIGH (ref 70–99)
Potassium: 4.3 mmol/L (ref 3.5–5.1)
Sodium: 138 mmol/L (ref 135–145)
Total Bilirubin: 0.8 mg/dL (ref 0.3–1.2)
Total Protein: 7.8 g/dL (ref 6.5–8.1)

## 2019-01-03 LAB — LACTATE DEHYDROGENASE: LDH: 148 U/L (ref 98–192)

## 2019-01-03 LAB — CBC WITH DIFFERENTIAL/PLATELET
Abs Immature Granulocytes: 0.04 10*3/uL (ref 0.00–0.07)
Basophils Absolute: 0 10*3/uL (ref 0.0–0.1)
Basophils Relative: 0 %
Eosinophils Absolute: 0.1 10*3/uL (ref 0.0–0.5)
Eosinophils Relative: 3 %
HCT: 39.9 % (ref 39.0–52.0)
Hemoglobin: 12.6 g/dL — ABNORMAL LOW (ref 13.0–17.0)
Immature Granulocytes: 1 %
Lymphocytes Relative: 26 %
Lymphs Abs: 1.4 10*3/uL (ref 0.7–4.0)
MCH: 27.2 pg (ref 26.0–34.0)
MCHC: 31.6 g/dL (ref 30.0–36.0)
MCV: 86 fL (ref 80.0–100.0)
Monocytes Absolute: 0.3 10*3/uL (ref 0.1–1.0)
Monocytes Relative: 5 %
Neutro Abs: 3.7 10*3/uL (ref 1.7–7.7)
Neutrophils Relative %: 65 %
Platelets: 112 10*3/uL — ABNORMAL LOW (ref 150–400)
RBC: 4.64 MIL/uL (ref 4.22–5.81)
RDW: 14 % (ref 11.5–15.5)
WBC: 5.5 10*3/uL (ref 4.0–10.5)
nRBC: 0 % (ref 0.0–0.2)

## 2019-01-03 MED ORDER — DIPHENHYDRAMINE HCL 25 MG PO CAPS
25.0000 mg | ORAL_CAPSULE | Freq: Once | ORAL | Status: AC
  Administered 2019-01-03: 25 mg via ORAL

## 2019-01-03 MED ORDER — SODIUM CHLORIDE 0.9 % IV SOLN
375.0000 mg/m2 | Freq: Once | INTRAVENOUS | Status: AC
  Administered 2019-01-03: 900 mg via INTRAVENOUS
  Filled 2019-01-03: qty 50

## 2019-01-03 MED ORDER — ACETAMINOPHEN 325 MG PO TABS
ORAL_TABLET | ORAL | Status: AC
  Filled 2019-01-03: qty 2

## 2019-01-03 MED ORDER — SODIUM CHLORIDE 0.9 % IV SOLN
Freq: Once | INTRAVENOUS | Status: AC
  Administered 2019-01-03: 14:00:00 via INTRAVENOUS
  Filled 2019-01-03: qty 250

## 2019-01-03 MED ORDER — FAMOTIDINE IN NACL 20-0.9 MG/50ML-% IV SOLN
20.0000 mg | Freq: Once | INTRAVENOUS | Status: AC
  Administered 2019-01-03: 20 mg via INTRAVENOUS

## 2019-01-03 MED ORDER — DIPHENHYDRAMINE HCL 25 MG PO CAPS
ORAL_CAPSULE | ORAL | Status: AC
  Filled 2019-01-03: qty 1

## 2019-01-03 MED ORDER — ACETAMINOPHEN 325 MG PO TABS
650.0000 mg | ORAL_TABLET | Freq: Once | ORAL | Status: AC
  Administered 2019-01-03: 650 mg via ORAL

## 2019-01-03 MED ORDER — FAMOTIDINE IN NACL 20-0.9 MG/50ML-% IV SOLN
INTRAVENOUS | Status: AC
  Filled 2019-01-03: qty 50

## 2019-01-03 NOTE — Patient Instructions (Signed)
[facility] Discharge Instructions for Patients Receiving Chemotherapy  Today you received the following chemotherapy agents:  Rituxan.  To help prevent nausea and vomiting after your treatment, we encourage you to take your nausea medication as directed.   If you develop nausea and vomiting that is not controlled by your nausea medication, call the clinic.   BELOW ARE SYMPTOMS THAT SHOULD BE REPORTED IMMEDIATELY:  *FEVER GREATER THAN 100.5 F  *CHILLS WITH OR WITHOUT FEVER  NAUSEA AND VOMITING THAT IS NOT CONTROLLED WITH YOUR NAUSEA MEDICATION  *UNUSUAL SHORTNESS OF BREATH  *UNUSUAL BRUISING OR BLEEDING  TENDERNESS IN MOUTH AND THROAT WITH OR WITHOUT PRESENCE OF ULCERS  *URINARY PROBLEMS  *BOWEL PROBLEMS  UNUSUAL RASH Items with * indicate a potential emergency and should be followed up as soon as possible.  Feel free to call the clinic should you have any questions or concerns. The clinic phone number is [phone].  Please show the CHEMO ALERT CARD at check-in to the Emergency Department and triage nurse.   

## 2019-01-10 ENCOUNTER — Inpatient Hospital Stay: Payer: 59

## 2019-01-10 ENCOUNTER — Inpatient Hospital Stay (HOSPITAL_BASED_OUTPATIENT_CLINIC_OR_DEPARTMENT_OTHER): Payer: 59 | Admitting: Adult Health

## 2019-01-10 ENCOUNTER — Encounter: Payer: Self-pay | Admitting: Adult Health

## 2019-01-10 VITALS — BP 150/86 | HR 81 | Temp 97.8°F | Resp 18 | Ht 72.0 in | Wt 231.6 lb

## 2019-01-10 VITALS — BP 127/72 | HR 74 | Temp 98.3°F | Resp 16

## 2019-01-10 DIAGNOSIS — C9110 Chronic lymphocytic leukemia of B-cell type not having achieved remission: Secondary | ICD-10-CM

## 2019-01-10 DIAGNOSIS — Z5112 Encounter for antineoplastic immunotherapy: Secondary | ICD-10-CM | POA: Diagnosis not present

## 2019-01-10 LAB — CBC WITH DIFFERENTIAL/PLATELET
Abs Immature Granulocytes: 0.03 10*3/uL (ref 0.00–0.07)
Basophils Absolute: 0 10*3/uL (ref 0.0–0.1)
Basophils Relative: 0 %
Eosinophils Absolute: 0.2 10*3/uL (ref 0.0–0.5)
Eosinophils Relative: 3 %
HCT: 39.8 % (ref 39.0–52.0)
Hemoglobin: 12.5 g/dL — ABNORMAL LOW (ref 13.0–17.0)
Immature Granulocytes: 1 %
Lymphocytes Relative: 28 %
Lymphs Abs: 1.6 10*3/uL (ref 0.7–4.0)
MCH: 27.2 pg (ref 26.0–34.0)
MCHC: 31.4 g/dL (ref 30.0–36.0)
MCV: 86.5 fL (ref 80.0–100.0)
Monocytes Absolute: 0.3 10*3/uL (ref 0.1–1.0)
Monocytes Relative: 5 %
Neutro Abs: 3.7 10*3/uL (ref 1.7–7.7)
Neutrophils Relative %: 63 %
Platelets: 106 10*3/uL — ABNORMAL LOW (ref 150–400)
RBC: 4.6 MIL/uL (ref 4.22–5.81)
RDW: 13.9 % (ref 11.5–15.5)
WBC: 5.8 10*3/uL (ref 4.0–10.5)
nRBC: 0 % (ref 0.0–0.2)

## 2019-01-10 LAB — COMPREHENSIVE METABOLIC PANEL
ALT: 45 U/L — ABNORMAL HIGH (ref 0–44)
AST: 36 U/L (ref 15–41)
Albumin: 4.4 g/dL (ref 3.5–5.0)
Alkaline Phosphatase: 90 U/L (ref 38–126)
Anion gap: 10 (ref 5–15)
BUN: 18 mg/dL (ref 8–23)
CO2: 25 mmol/L (ref 22–32)
Calcium: 9.1 mg/dL (ref 8.9–10.3)
Chloride: 103 mmol/L (ref 98–111)
Creatinine, Ser: 1.6 mg/dL — ABNORMAL HIGH (ref 0.61–1.24)
GFR calc Af Amer: 52 mL/min — ABNORMAL LOW (ref >60)
GFR calc non Af Amer: 45 mL/min — ABNORMAL LOW (ref >60)
Glucose, Bld: 244 mg/dL — ABNORMAL HIGH (ref 70–99)
Potassium: 4.5 mmol/L (ref 3.5–5.1)
Sodium: 138 mmol/L (ref 135–145)
Total Bilirubin: 0.9 mg/dL (ref 0.3–1.2)
Total Protein: 7.9 g/dL (ref 6.5–8.1)

## 2019-01-10 LAB — LACTATE DEHYDROGENASE: LDH: 150 U/L (ref 98–192)

## 2019-01-10 MED ORDER — DIPHENHYDRAMINE HCL 25 MG PO CAPS
ORAL_CAPSULE | ORAL | Status: AC
  Filled 2019-01-10: qty 1

## 2019-01-10 MED ORDER — ACETAMINOPHEN 325 MG PO TABS
650.0000 mg | ORAL_TABLET | Freq: Once | ORAL | Status: AC
  Administered 2019-01-10: 650 mg via ORAL

## 2019-01-10 MED ORDER — FAMOTIDINE IN NACL 20-0.9 MG/50ML-% IV SOLN
20.0000 mg | Freq: Once | INTRAVENOUS | Status: AC
  Administered 2019-01-10: 20 mg via INTRAVENOUS

## 2019-01-10 MED ORDER — DIPHENHYDRAMINE HCL 25 MG PO CAPS
25.0000 mg | ORAL_CAPSULE | Freq: Once | ORAL | Status: AC
  Administered 2019-01-10: 25 mg via ORAL

## 2019-01-10 MED ORDER — ACETAMINOPHEN 325 MG PO TABS
ORAL_TABLET | ORAL | Status: AC
  Filled 2019-01-10: qty 2

## 2019-01-10 MED ORDER — SODIUM CHLORIDE 0.9 % IV SOLN
375.0000 mg/m2 | Freq: Once | INTRAVENOUS | Status: AC
  Administered 2019-01-10: 900 mg via INTRAVENOUS
  Filled 2019-01-10: qty 50

## 2019-01-10 MED ORDER — SODIUM CHLORIDE 0.9 % IV SOLN
Freq: Once | INTRAVENOUS | Status: AC
  Administered 2019-01-10: 13:00:00 via INTRAVENOUS
  Filled 2019-01-10: qty 250

## 2019-01-10 MED ORDER — FAMOTIDINE IN NACL 20-0.9 MG/50ML-% IV SOLN
INTRAVENOUS | Status: AC
  Filled 2019-01-10: qty 50

## 2019-01-10 NOTE — Patient Instructions (Signed)
[facility] Discharge Instructions for Patients Receiving Chemotherapy  Today you received the following chemotherapy agents:  Rituxan.  To help prevent nausea and vomiting after your treatment, we encourage you to take your nausea medication as directed.   If you develop nausea and vomiting that is not controlled by your nausea medication, call the clinic.   BELOW ARE SYMPTOMS THAT SHOULD BE REPORTED IMMEDIATELY:  *FEVER GREATER THAN 100.5 F  *CHILLS WITH OR WITHOUT FEVER  NAUSEA AND VOMITING THAT IS NOT CONTROLLED WITH YOUR NAUSEA MEDICATION  *UNUSUAL SHORTNESS OF BREATH  *UNUSUAL BRUISING OR BLEEDING  TENDERNESS IN MOUTH AND THROAT WITH OR WITHOUT PRESENCE OF ULCERS  *URINARY PROBLEMS  *BOWEL PROBLEMS  UNUSUAL RASH Items with * indicate a potential emergency and should be followed up as soon as possible.  Feel free to call the clinic should you have any questions or concerns. The clinic phone number is [phone].  Please show the CHEMO ALERT CARD at check-in to the Emergency Department and triage nurse.   

## 2019-01-10 NOTE — Progress Notes (Signed)
Cody [name]  [phone] [phone]     ID: Cody [name] DOB: Jul 09, 1956  MR#: [id number]  JYN#:[id number]  Patient Care Team: [name], MD as PCP - General (Family Medicine) Magrinat, Virgie Dad, MD as Consulting Physician (Oncology) Scot Dock, NP OTHER MD:  CHIEF COMPLAINT: Chronic lymphoid leukemia  CURRENT TREATMENT: Rituximab   HISTORY OF CURRENT ILLNESS: From the original intake note:  Mr. Pitcock was evaluated by his primary care physician and found to have a high white cell count.  He was asked to go to the emergency room which the patient did on 11/04/2018 at 8 PM.  The patient was found to be stable, with a white cell count of greater than 150,000, most of which were lymphocytes.  A pathology smear review by Dr. Reuel Derby  showed lymphocytosis and smudge cells.  There were no blasts.  RBCs and platelets were unremarkable.  The patient was referred to our clinic for further evaluation  Mr. Jutras subsequent history is as detailed below.   INTERVAL HISTORY: Cody [name] returns today for follow-up and treatment of his chronic lymphoid leukemia. He is accompanied by his sister, Manuella Ghazi.  He continues on rituximab and will receive his last of the weekly Rituximab treatments.  He will then go to every two month maintenance Ritxumab.  He is feeling well today.   REVIEW OF SYSTEMS: Cody [name] says he is feeling good.  He is tired sometimes, but his sister notes he doesn't do too much.  He exercises daily by walking on the treadmill for thirty minutes.  He is eating and drinking well.  He denies any lymphadenopathy, night sweats, fevers, or chills.  He is without headaches or vision changes.  He doesn't note chest pain, shortness of breath, cough, palpitations.  He denies bowel/bladder changes, nausea or vomiting.  A detailed ROS was otherwise non contributory.     PAST MEDICAL HISTORY: Past Medical History:  Diagnosis Date  .  Diabetes mellitus without complication ([place])   Hypertension, hypercholesterolemia, cataracts, seasonal allergies, cirrhosis, history of EtOH abuse   PAST SURGICAL HISTORY: History reviewed. No pertinent surgical history. Surgery for incarcerated bowel release   FAMILY HISTORY History reviewed. No pertinent family history.  The patient's father died from cirrhosis at age 59 in the setting of alcohol abuse; he also had significant coronary artery disease.  The patient's mother is living, 86 years old as of December 2019.  The patient has 2 sisters, no brothers.  They are not aware of any cancer history in the family   SOCIAL HISTORY:  The patient used to work in [employer] for the city of [place].  He was briefly married but the marriage has been annulled.  The patient currently lives with his sister Cadin Luka and their mother.  A second sister, Charma Igo, lives in [place].   ADVANCED DIRECTIVES: The patient Sister [name] is his healthcare power of attorney.  She can be reached at [phone].   HEALTH MAINTENANCE: Social History   Tobacco Use  . Smoking status: Never Smoker  . Smokeless tobacco: Never Used  Substance Use Topics  . Alcohol use: Not Currently  . Drug use: No    Colonoscopy:  PSA:  Bone density:   No Known Allergies  Current Outpatient Medications  Medication Sig Dispense Refill  . allopurinol (ZYLOPRIM) 300 MG tablet Take 1 tablet (300 mg total) by mouth daily. 90 tablet 4  . amLODipine (NORVASC) 10 MG tablet Take 10 mg by mouth  daily.    . atorvastatin (LIPITOR) 10 MG tablet Take 10 mg by mouth daily.    . Insulin Glargine, 2 Unit Dial, (TOUJEO MAX SOLOSTAR) 300 UNIT/ML SOPN Inject 12 Units into the skin daily.     . JENTADUETO 2.03-999 MG TABS Take 1 tablet by mouth daily with supper.   3  . metFORMIN (GLUCOPHAGE) 1000 MG tablet Take 1,000 mg by mouth 2 (two) times daily.     . ondansetron (ZOFRAN) 4 MG tablet Take 1 tablet (4 mg  total) by mouth every 8 (eight) hours as needed for nausea or vomiting. 20 tablet 0  . ONETOUCH DELICA LANCETS FINE MISC 3 (three) times daily.   5  . telmisartan-hydrochlorothiazide (MICARDIS HCT) 80-25 MG tablet Take 1 tablet by mouth daily.     No current facility-administered medications for this visit.     OBJECTIVE: Vitals:   01/10/19 1123  BP: (!) 150/86  Pulse: 81  Resp: 18  Temp: 97.8 F (36.6 C)  SpO2: 99%     Body mass index is 31.41 kg/m.   Wt Readings from Last 3 Encounters:  01/10/19 231 lb 9.6 oz (105.1 kg)  12/20/18 228 lb 4.8 oz (103.6 kg)  12/13/18 227 lb 12 oz (103.3 kg)      ECOG FS:0 - Asymptomatic GENERAL: Patient is a well appearing older male in no acute distress HEENT:  Sclerae anicteric.  Oropharynx clear and moist. No ulcerations or evidence of oropharyngeal candidiasis. Neck is supple.  NODES:  No cervical, supraclavicular, or axillary lymphadenopathy palpated.  LUNGS:  Clear to auscultation bilaterally.  No wheezes or rhonchi. HEART:  Regular rate and rhythm. No murmur appreciated. ABDOMEN:  Soft, nontender.  Positive, normoactive bowel sounds. No organomegaly palpated. MSK:  No focal spinal tenderness to palpation.  EXTREMITIES:  No peripheral edema.   SKIN:  Clear with no obvious rashes or skin changes. No nail dyscrasia. NEURO:  Nonfocal. Well oriented. Slight cognitive dysfunction.      LAB RESULTS:  CMP     Component Value Date/Time   NA 138 01/10/2019 1102   K 4.5 01/10/2019 1102   CL 103 01/10/2019 1102   CO2 25 01/10/2019 1102   GLUCOSE 244 (H) 01/10/2019 1102   BUN 18 01/10/2019 1102   CREATININE 1.60 (H) 01/10/2019 1102   CALCIUM 9.1 01/10/2019 1102   PROT 7.9 01/10/2019 1102   ALBUMIN 4.4 01/10/2019 1102   AST 36 01/10/2019 1102   ALT 45 (H) 01/10/2019 1102   ALKPHOS 90 01/10/2019 1102   BILITOT 0.9 01/10/2019 1102   GFRNONAA 45 (L) 01/10/2019 1102   GFRAA 52 (L) 01/10/2019 1102    No results found for:  TOTALPROTELP, ALBUMINELP, A1GS, A2GS, BETS, BETA2SER, GAMS, MSPIKE, SPEI  No results found for: KPAFRELGTCHN, LAMBDASER, KAPLAMBRATIO  Lab Results  Component Value Date   WBC 5.8 01/10/2019   NEUTROABS 3.7 01/10/2019   HGB 12.5 (L) 01/10/2019   HCT 39.8 01/10/2019   MCV 86.5 01/10/2019   PLT 106 (L) 01/10/2019      Chemistry      Component Value Date/Time   NA 138 01/10/2019 1102   K 4.5 01/10/2019 1102   CL 103 01/10/2019 1102   CO2 25 01/10/2019 1102   BUN 18 01/10/2019 1102   CREATININE 1.60 (H) 01/10/2019 1102      Component Value Date/Time   CALCIUM 9.1 01/10/2019 1102   ALKPHOS 90 01/10/2019 1102   AST 36 01/10/2019 1102   ALT 45 (H) 01/10/2019  1102   BILITOT 0.9 01/10/2019 1102       No results found for: LABCA2  No components found for: DGLOVF643  No results for input(s): INR in the last 168 hours.  No results found for: LABCA2  No results found for: PIR518  No results found for: ACZ660  No results found for: YTK160  No results found for: CA2729  No components found for: HGQUANT  No results found for: CEA1 / No results found for: CEA1   No results found for: AFPTUMOR  No results found for: CHROMOGRNA  No results found for: PSA1  Appointment on 01/10/2019  Component Date Value Ref Range Status  . WBC 01/10/2019 5.8  4.0 - 10.5 K/uL Final  . RBC 01/10/2019 4.60  4.22 - 5.81 MIL/uL Final  . Hemoglobin 01/10/2019 12.5* 13.0 - 17.0 g/dL Final  . HCT 01/10/2019 39.8  39.0 - 52.0 % Final  . MCV 01/10/2019 86.5  80.0 - 100.0 fL Final  . MCH 01/10/2019 27.2  26.0 - 34.0 pg Final  . MCHC 01/10/2019 31.4  30.0 - 36.0 g/dL Final  . RDW 01/10/2019 13.9  11.5 - 15.5 % Final  . Platelets 01/10/2019 106* 150 - 400 K/uL Final  . nRBC 01/10/2019 0.0  0.0 - 0.2 % Final  . Neutrophils Relative % 01/10/2019 63  % Final  . Neutro Abs 01/10/2019 3.7  1.7 - 7.7 K/uL Final  . Lymphocytes Relative 01/10/2019 28  % Final  . Lymphs Abs 01/10/2019 1.6  0.7 - 4.0  K/uL Final  . Monocytes Relative 01/10/2019 5  % Final  . Monocytes Absolute 01/10/2019 0.3  0.1 - 1.0 K/uL Final  . Eosinophils Relative 01/10/2019 3  % Final  . Eosinophils Absolute 01/10/2019 0.2  0.0 - 0.5 K/uL Final  . Basophils Relative 01/10/2019 0  % Final  . Basophils Absolute 01/10/2019 0.0  0.0 - 0.1 K/uL Final  . Immature Granulocytes 01/10/2019 1  % Final  . Abs Immature Granulocytes 01/10/2019 0.03  0.00 - 0.07 K/uL Final   Performed at [facility], [place] [address] [place], [place] [address]  . Sodium 01/10/2019 138  135 - 145 mmol/L Final  . Potassium 01/10/2019 4.5  3.5 - 5.1 mmol/L Final  . Chloride 01/10/2019 103  98 - 111 mmol/L Final  . CO2 01/10/2019 25  22 - 32 mmol/L Final  . Glucose, Bld 01/10/2019 244* 70 - 99 mg/dL Final  . BUN 01/10/2019 18  8 - 23 mg/dL Final  . Creatinine, Ser 01/10/2019 1.60* 0.61 - 1.24 mg/dL Final  . Calcium 01/10/2019 9.1  8.9 - 10.3 mg/dL Final  . Total Protein 01/10/2019 7.9  6.5 - 8.1 g/dL Final  . Albumin 01/10/2019 4.4  3.5 - 5.0 g/dL Final  . AST 01/10/2019 36  15 - 41 U/L Final  . ALT 01/10/2019 45* 0 - 44 U/L Final  . Alkaline Phosphatase 01/10/2019 90  38 - 126 U/L Final  . Total Bilirubin 01/10/2019 0.9  0.3 - 1.2 mg/dL Final  . GFR calc non Af Amer 01/10/2019 45* >60 mL/min Final  . GFR calc Af Amer 01/10/2019 52* >60 mL/min Final  . Anion gap 01/10/2019 10  5 - 15 Final   Performed at [facility], [place] [address] [place], [place] [address]    (this displays the last labs from the last 3 days)  No results found for: TOTALPROTELP, ALBUMINELP, A1GS, A2GS, BETS, BETA2SER, GAMS, MSPIKE, SPEI (this  displays SPEP labs)  No results found for: KPAFRELGTCHN, LAMBDASER, KAPLAMBRATIO (kappa/lambda light chains)  No results found for: HGBA, HGBA2QUANT, HGBFQUANT, HGBSQUAN (Hemoglobinopathy evaluation)   Lab Results  Component Value Date   LDH 148 01/03/2019    No results  found for: IRON, TIBC, IRONPCTSAT (Iron and TIBC)  No results found for: FERRITIN  Urinalysis    Component Value Date/Time   COLORURINE STRAW (A) 11/23/2018 2018   APPEARANCEUR CLEAR 11/23/2018 2018   LABSPEC 1.020 11/23/2018 2018   PHURINE 5.0 11/23/2018 2018   GLUCOSEU >=500 (A) 11/23/2018 2018   HGBUR NEGATIVE 11/23/2018 2018   [place] NEGATIVE 11/23/2018 2018   [place] NEGATIVE 11/23/2018 2018   PROTEINUR NEGATIVE 11/23/2018 2018   NITRITE NEGATIVE 11/23/2018 2018   LEUKOCYTESUR NEGATIVE 11/23/2018 2018     STUDIES: No results found.   ELIGIBLE FOR AVAILABLE RESEARCH PROTOCOL: no   ASSESSMENT: 63 y.o. [place], [place] man with a diagnosis of chronic lymphoid leukemia made 11/04/2018  (1) Starting Rituximab weekly x 9 weeks beginning 11/23/2018  (a) reaction to first dose (which was 100 mg only).  (2) to start maintenance, every 54-month rituximab for 2 years, first dose 03/07/2019   PLAN:  Cody [name] is doing well today.  His WBC remains stable.  His platelets are over 100.  He is tolerating the Rituximab well and will proceed with his final weekly dose today.    I encouraged Billy to continue exercise and to remain active.    He will continue the allopurinol indefinitely, and they do not need any refills at this point.    Cody [name] will return in 02/2019 for labs, f/u with Dr. Jana Hakim, and to start his maintenance Rituximab.  They know to call for any other issues that may develop before the next visit.  A total of (20) minutes of face-to-face time was spent with this patient with greater than 50% of that time in counseling and care-coordination.   Wilber Bihari, NP  01/10/19 12:04 PM Medical Oncology and Hematology [facility], [place] [address] [place], [place] [address] Tel. [phone]    Fax. [phone]

## 2019-01-11 ENCOUNTER — Telehealth: Payer: Self-pay | Admitting: Adult Health

## 2019-01-11 NOTE — Telephone Encounter (Signed)
No los °

## 2019-03-04 ENCOUNTER — Telehealth: Payer: Self-pay | Admitting: Oncology

## 2019-03-04 NOTE — Telephone Encounter (Signed)
Scheduled appt per 4/10 sch message - unable to reach patient's sister - left message for sister with new appt date and time

## 2019-03-07 ENCOUNTER — Other Ambulatory Visit: Payer: 59

## 2019-03-07 ENCOUNTER — Inpatient Hospital Stay: Payer: 59

## 2019-03-07 ENCOUNTER — Ambulatory Visit: Payer: 59 | Admitting: Oncology

## 2019-04-11 ENCOUNTER — Encounter: Payer: Self-pay | Admitting: Oncology

## 2019-04-11 NOTE — Progress Notes (Signed)
[place]  [phone] [phone]) [phone]     ID: Cody [name] DOB: 31-Jan-1956  MR#: [id number]  HEN#:[id number]  Patient Care Team: [name], MD as PCP - General (Family Medicine) Severa Jeremiah, Virgie Dad, MD as Consulting Physician (Oncology) Chauncey Cruel, MD OTHER MD:  CHIEF COMPLAINT: Chronic lymphoid leukemia  CURRENT TREATMENT: maintenance Rituximab   INTERVAL HISTORY: Cody [name] returns today for follow-up and treatment of his chronic lymphoid leukemia. He is accompanied by his sister.  He continues on maintenance Rituxumab every two months, with a dose today. He tolerates this well with no noticeable side effects. He notes an isolated episode of shaking chills but none since.  Since his last visit, he has not undergone any additional studies.   REVIEW OF SYSTEMS: Cody [name] reports doing well overall. He uses his treadmill for about 30 minutes a day and works in the yard. His sister assisted in filling in his history. The patient denies unusual headaches, visual changes, nausea, vomiting, stiff neck, dizziness, or gait imbalance. There has been no cough, phlegm production, or pleurisy, no chest pain or pressure, and no change in bowel or bladder habits. The patient denies fever, rash, bleeding, unexplained fatigue or unexplained weight loss. A detailed review of systems was otherwise entirely negative.   HISTORY OF CURRENT ILLNESS: From the original intake note:  Cody [name] was evaluated by his primary care physician and found to have a high white cell count.  He was asked to go to the emergency room which the patient did on 11/04/2018 at 8 PM.  The patient was found to be stable, with a white cell count of greater than 150,000, most of which were lymphocytes.  A pathology smear review by Dr. Reuel Derby  showed lymphocytosis and smudge cells.  There were no blasts.  RBCs and platelets were unremarkable.  The patient was referred to our clinic  for further evaluation  Cody [name] subsequent history is as detailed below.   PAST MEDICAL HISTORY: Past Medical History:  Diagnosis Date   Alcoholic cirrhosis ([place])    Cataract    Diabetes mellitus without complication ([place])    History of alcohol abuse    Hypercholesterolemia   Hypertension, hypercholesterolemia, cataracts, seasonal allergies, cirrhosis, history of EtOH abuse   PAST SURGICAL HISTORY: No past surgical history on file. Surgery for incarcerated bowel release   FAMILY HISTORY Family History  Problem Relation Age of Onset   Alcohol abuse Father    Cirrhosis Father    Coronary artery disease Father     The patient's father died from cirrhosis at age 23 in the setting of alcohol abuse; he also had significant coronary artery disease.  The patient's mother is living, 16 years old as of December 2019.  The patient has 2 sisters, no brothers.  They are not aware of any cancer history in the family   SOCIAL HISTORY:  The patient used to work in [employer] for the city of [place].  He was briefly married but the marriage has been annulled.  The patient currently lives with his sister Davyn Morandi and their mother.  A second sister, Charma Igo, lives in [place].   ADVANCED DIRECTIVES: The patient Sister [name] is his healthcare power of attorney.  She can be reached at [phone].   HEALTH MAINTENANCE: Social History   Tobacco Use   Smoking status: Never Smoker   Smokeless tobacco: Never Used  Substance Use Topics   Alcohol use: Not Currently   Drug  use: No    Colonoscopy:  PSA:  Bone density:   No Known Allergies  Current Outpatient Medications  Medication Sig Dispense Refill   allopurinol (ZYLOPRIM) 300 MG tablet Take 1 tablet (300 mg total) by mouth daily. 90 tablet 4   amLODipine (NORVASC) 10 MG tablet Take 10 mg by mouth daily.     atorvastatin (LIPITOR) 10 MG tablet Take 10 mg by mouth daily.      Insulin Glargine, 2 Unit Dial, (TOUJEO MAX SOLOSTAR) 300 UNIT/ML SOPN Inject 12 Units into the skin daily.      JENTADUETO 2.03-999 MG TABS Take 1 tablet by mouth daily with supper.   3   metFORMIN (GLUCOPHAGE) 1000 MG tablet Take 1,000 mg by mouth 2 (two) times daily.      ondansetron (ZOFRAN) 4 MG tablet Take 1 tablet (4 mg total) by mouth every 8 (eight) hours as needed for nausea or vomiting. 20 tablet 0   ONETOUCH DELICA LANCETS FINE MISC 3 (three) times daily.   5   telmisartan-hydrochlorothiazide (MICARDIS HCT) 80-25 MG tablet Take 1 tablet by mouth daily.     No current facility-administered medications for this visit.     OBJECTIVE: Middle-aged African-American man in no acute distress There were no vitals filed for this visit.   There is no height or weight on file to calculate BMI.   Wt Readings from Last 3 Encounters:  01/10/19 231 lb 9.6 oz (105.1 kg)  12/20/18 228 lb 4.8 oz (103.6 kg)  12/13/18 227 lb 12 oz (103.3 kg)      ECOG FS:0 - Asymptomatic  Sclerae unicteric, EOMs intact Oropharynx clear and moist No cervical or supraclavicular adenopathy, no axillary or inguinal adenopathy Lungs no rales or rhonchi Heart regular rate and rhythm Abd soft, nontender, positive bowel sounds MSK no focal spinal tenderness Neuro: nonfocal, well oriented, appropriate affect   LAB RESULTS:  CMP     Component Value Date/Time   NA 136 04/13/2019 0814   K 4.4 04/13/2019 0814   CL 101 04/13/2019 0814   CO2 25 04/13/2019 0814   GLUCOSE 161 (H) 04/13/2019 0814   BUN 24 (H) 04/13/2019 0814   CREATININE 1.39 (H) 04/13/2019 0814   CALCIUM 9.3 04/13/2019 0814   PROT 7.9 04/13/2019 0814   ALBUMIN 4.4 04/13/2019 0814   AST 29 04/13/2019 0814   ALT 44 04/13/2019 0814   ALKPHOS 98 04/13/2019 0814   BILITOT 0.7 04/13/2019 0814   GFRNONAA 53 (L) 04/13/2019 0814   GFRAA >60 04/13/2019 0814    No results found for: TOTALPROTELP, ALBUMINELP, A1GS, A2GS, BETS, BETA2SER, GAMS,  MSPIKE, SPEI  No results found for: Nils Pyle, [facility]  Lab Results  Component Value Date   WBC 7.2 04/13/2019   NEUTROABS 4.6 04/13/2019   HGB 12.6 (L) 04/13/2019   HCT 40.3 04/13/2019   MCV 86.3 04/13/2019   PLT 109 (L) 04/13/2019      Chemistry      Component Value Date/Time   NA 136 04/13/2019 0814   K 4.4 04/13/2019 0814   CL 101 04/13/2019 0814   CO2 25 04/13/2019 0814   BUN 24 (H) 04/13/2019 0814   CREATININE 1.39 (H) 04/13/2019 0814      Component Value Date/Time   CALCIUM 9.3 04/13/2019 0814   ALKPHOS 98 04/13/2019 0814   AST 29 04/13/2019 0814   ALT 44 04/13/2019 0814   BILITOT 0.7 04/13/2019 0814       No results found for: LABCA2  No components found for: EPPIRJ188  No results for input(s): INR in the last 168 hours.  No results found for: LABCA2  No results found for: CZY606  No results found for: TKZ601  No results found for: UXN235  No results found for: CA2729  No components found for: HGQUANT  No results found for: CEA1 / No results found for: CEA1   No results found for: AFPTUMOR  No results found for: [place]  No results found for: PSA1  Appointment on 04/13/2019  Component Date Value Ref Range Status   WBC 04/13/2019 7.2  4.0 - 10.5 K/uL Final   RBC 04/13/2019 4.67  4.22 - 5.81 MIL/uL Final   Hemoglobin 04/13/2019 12.6* 13.0 - 17.0 g/dL Final   HCT 04/13/2019 40.3  39.0 - 52.0 % Final   MCV 04/13/2019 86.3  80.0 - 100.0 fL Final   MCH 04/13/2019 27.0  26.0 - 34.0 pg Final   MCHC 04/13/2019 31.3  30.0 - 36.0 g/dL Final   RDW 04/13/2019 13.5  11.5 - 15.5 % Final   Platelets 04/13/2019 109* 150 - 400 K/uL Final   nRBC 04/13/2019 0.0  0.0 - 0.2 % Final   Neutrophils Relative % 04/13/2019 64  % Final   Neutro Abs 04/13/2019 4.6  1.7 - 7.7 K/uL Final   Lymphocytes Relative 04/13/2019 29  % Final   Lymphs Abs 04/13/2019 2.1  0.7 - 4.0 K/uL Final   Monocytes Relative 04/13/2019 5  % Final    Monocytes Absolute 04/13/2019 0.4  0.1 - 1.0 K/uL Final   Eosinophils Relative 04/13/2019 2  % Final   Eosinophils Absolute 04/13/2019 0.1  0.0 - 0.5 K/uL Final   Basophils Relative 04/13/2019 0  % Final   Basophils Absolute 04/13/2019 0.0  0.0 - 0.1 K/uL Final   Immature Granulocytes 04/13/2019 0  % Final   Abs Immature Granulocytes 04/13/2019 0.02  0.00 - 0.07 K/uL Final   Performed at [facility], [place] [address], [place], [place] [address]   Sodium 04/13/2019 136  135 - 145 mmol/L Final   Potassium 04/13/2019 4.4  3.5 - 5.1 mmol/L Final   Chloride 04/13/2019 101  98 - 111 mmol/L Final   CO2 04/13/2019 25  22 - 32 mmol/L Final   Glucose, Bld 04/13/2019 161* 70 - 99 mg/dL Final   BUN 04/13/2019 24* 8 - 23 mg/dL Final   Creatinine, Ser 04/13/2019 1.39* 0.61 - 1.24 mg/dL Final   Calcium 04/13/2019 9.3  8.9 - 10.3 mg/dL Final   Total Protein 04/13/2019 7.9  6.5 - 8.1 g/dL Final   Albumin 04/13/2019 4.4  3.5 - 5.0 g/dL Final   AST 04/13/2019 29  15 - 41 U/L Final   ALT 04/13/2019 44  0 - 44 U/L Final   Alkaline Phosphatase 04/13/2019 98  38 - 126 U/L Final   Total Bilirubin 04/13/2019 0.7  0.3 - 1.2 mg/dL Final   GFR calc non Af Amer 04/13/2019 53* >60 mL/min Final   GFR calc Af Amer 04/13/2019 >60  >60 mL/min Final   Anion gap 04/13/2019 10  5 - 15 Final   Performed at [facility], [place] [address], [place], [place] [address]    (this displays the last labs from the last 3 days)  No results found for: TOTALPROTELP, ALBUMINELP, A1GS, A2GS, BETS, BETA2SER, GAMS, MSPIKE, SPEI (this displays SPEP labs)  No results found for: KPAFRELGTCHN, LAMBDASER, KAPLAMBRATIO (kappa/lambda light chains)  No results found  for: HGBA, HGBA2QUANT, HGBFQUANT, HGBSQUAN (Hemoglobinopathy evaluation)   Lab Results  Component Value Date   LDH 150 01/10/2019    No results found for: IRON, TIBC, IRONPCTSAT (Iron and TIBC)  No  results found for: FERRITIN  Urinalysis    Component Value Date/Time   COLORURINE STRAW (A) 11/23/2018 2018   APPEARANCEUR CLEAR 11/23/2018 2018   LABSPEC 1.020 11/23/2018 2018   PHURINE 5.0 11/23/2018 2018   GLUCOSEU >=500 (A) 11/23/2018 2018   HGBUR NEGATIVE 11/23/2018 2018   [place] NEGATIVE 11/23/2018 2018   [place] NEGATIVE 11/23/2018 2018   PROTEINUR NEGATIVE 11/23/2018 2018   NITRITE NEGATIVE 11/23/2018 2018   LEUKOCYTESUR NEGATIVE 11/23/2018 2018     STUDIES: No results found.   ELIGIBLE FOR AVAILABLE RESEARCH PROTOCOL: no   ASSESSMENT: 63 y.o. [place], [place] man with a diagnosis of chronic lymphoid leukemia made 11/04/2018  (a) the cells are positive for CD 04/12/19/22/23, kappa restricted  (1) Starting Rituximab weekly x 9 weeks beginning 11/23/2018  (a) reaction to first dose (which was 100 mg only).  (2) on maintenance rituximab for 2 years, first dose 03/07/2019, repeated every 2 months   PLAN:  Cody [name] is now just about 6 months out from initial diagnosis of chronic lymphoid leukemia.  He is in remission, with no "B" symptoms, normal functional status, and normal labs.  He is tolerating rituximab well.  He receives this every 2 months.  We allow his sister to accompany him.  The plan is to do this for 2 years and then reevaluate  He will see me again in 6 months.  He knows to call for any other issue that may develop before the next visit.  Virgie Dad. Brave Dack, MD  04/13/19 8:59 AM Medical Oncology and Hematology [facility] [address] [place], [place] [address] Tel. [phone]    Fax. [phone]   I, Wilburn Mylar, am acting as scribe for Dr. Virgie Dad. Waynesha Rammel.  I, Lurline Del MD, have reviewed the above documentation for accuracy and completeness, and I agree with the above.

## 2019-04-12 ENCOUNTER — Other Ambulatory Visit: Payer: Self-pay | Admitting: Oncology

## 2019-04-12 DIAGNOSIS — C9110 Chronic lymphocytic leukemia of B-cell type not having achieved remission: Secondary | ICD-10-CM

## 2019-04-13 ENCOUNTER — Other Ambulatory Visit: Payer: Self-pay | Admitting: Oncology

## 2019-04-13 ENCOUNTER — Other Ambulatory Visit: Payer: Self-pay

## 2019-04-13 ENCOUNTER — Inpatient Hospital Stay (HOSPITAL_BASED_OUTPATIENT_CLINIC_OR_DEPARTMENT_OTHER): Payer: 59 | Admitting: Oncology

## 2019-04-13 ENCOUNTER — Inpatient Hospital Stay: Payer: 59

## 2019-04-13 ENCOUNTER — Inpatient Hospital Stay: Payer: 59 | Attending: Oncology

## 2019-04-13 VITALS — BP 135/79 | HR 72 | Temp 98.6°F | Resp 18

## 2019-04-13 VITALS — BP 163/76 | HR 80 | Temp 98.4°F | Resp 18 | Ht 72.0 in | Wt 234.8 lb

## 2019-04-13 DIAGNOSIS — C9110 Chronic lymphocytic leukemia of B-cell type not having achieved remission: Secondary | ICD-10-CM

## 2019-04-13 DIAGNOSIS — C9111 Chronic lymphocytic leukemia of B-cell type in remission: Secondary | ICD-10-CM | POA: Diagnosis not present

## 2019-04-13 DIAGNOSIS — Z5112 Encounter for antineoplastic immunotherapy: Secondary | ICD-10-CM | POA: Diagnosis not present

## 2019-04-13 LAB — COMPREHENSIVE METABOLIC PANEL
ALT: 44 U/L (ref 0–44)
AST: 29 U/L (ref 15–41)
Albumin: 4.4 g/dL (ref 3.5–5.0)
Alkaline Phosphatase: 98 U/L (ref 38–126)
Anion gap: 10 (ref 5–15)
BUN: 24 mg/dL — ABNORMAL HIGH (ref 8–23)
CO2: 25 mmol/L (ref 22–32)
Calcium: 9.3 mg/dL (ref 8.9–10.3)
Chloride: 101 mmol/L (ref 98–111)
Creatinine, Ser: 1.39 mg/dL — ABNORMAL HIGH (ref 0.61–1.24)
GFR calc Af Amer: >60 mL/min (ref >60)
GFR calc non Af Amer: 53 mL/min — ABNORMAL LOW (ref >60)
Glucose, Bld: 161 mg/dL — ABNORMAL HIGH (ref 70–99)
Potassium: 4.4 mmol/L (ref 3.5–5.1)
Sodium: 136 mmol/L (ref 135–145)
Total Bilirubin: 0.7 mg/dL (ref 0.3–1.2)
Total Protein: 7.9 g/dL (ref 6.5–8.1)

## 2019-04-13 LAB — CBC WITH DIFFERENTIAL/PLATELET
Abs Immature Granulocytes: 0.02 10*3/uL (ref 0.00–0.07)
Basophils Absolute: 0 10*3/uL (ref 0.0–0.1)
Basophils Relative: 0 %
Eosinophils Absolute: 0.1 10*3/uL (ref 0.0–0.5)
Eosinophils Relative: 2 %
HCT: 40.3 % (ref 39.0–52.0)
Hemoglobin: 12.6 g/dL — ABNORMAL LOW (ref 13.0–17.0)
Immature Granulocytes: 0 %
Lymphocytes Relative: 29 %
Lymphs Abs: 2.1 10*3/uL (ref 0.7–4.0)
MCH: 27 pg (ref 26.0–34.0)
MCHC: 31.3 g/dL (ref 30.0–36.0)
MCV: 86.3 fL (ref 80.0–100.0)
Monocytes Absolute: 0.4 10*3/uL (ref 0.1–1.0)
Monocytes Relative: 5 %
Neutro Abs: 4.6 10*3/uL (ref 1.7–7.7)
Neutrophils Relative %: 64 %
Platelets: 109 10*3/uL — ABNORMAL LOW (ref 150–400)
RBC: 4.67 MIL/uL (ref 4.22–5.81)
RDW: 13.5 % (ref 11.5–15.5)
WBC: 7.2 10*3/uL (ref 4.0–10.5)
nRBC: 0 % (ref 0.0–0.2)

## 2019-04-13 LAB — LACTATE DEHYDROGENASE: LDH: 160 U/L (ref 98–192)

## 2019-04-13 MED ORDER — DIPHENHYDRAMINE HCL 25 MG PO CAPS
25.0000 mg | ORAL_CAPSULE | Freq: Once | ORAL | Status: AC
  Administered 2019-04-13: 25 mg via ORAL

## 2019-04-13 MED ORDER — SODIUM CHLORIDE 0.9 % IV SOLN
375.0000 mg/m2 | Freq: Once | INTRAVENOUS | Status: AC
  Administered 2019-04-13: 900 mg via INTRAVENOUS
  Filled 2019-04-13: qty 50

## 2019-04-13 MED ORDER — FAMOTIDINE IN NACL 20-0.9 MG/50ML-% IV SOLN
INTRAVENOUS | Status: AC
  Filled 2019-04-13: qty 50

## 2019-04-13 MED ORDER — SODIUM CHLORIDE 0.9 % IV SOLN
Freq: Once | INTRAVENOUS | Status: AC
  Administered 2019-04-13: 11:00:00 via INTRAVENOUS
  Filled 2019-04-13: qty 250

## 2019-04-13 MED ORDER — ACETAMINOPHEN 325 MG PO TABS
ORAL_TABLET | ORAL | Status: AC
  Filled 2019-04-13: qty 2

## 2019-04-13 MED ORDER — FAMOTIDINE IN NACL 20-0.9 MG/50ML-% IV SOLN
20.0000 mg | Freq: Once | INTRAVENOUS | Status: AC
  Administered 2019-04-13: 11:00:00 20 mg via INTRAVENOUS

## 2019-04-13 MED ORDER — DIPHENHYDRAMINE HCL 25 MG PO CAPS
ORAL_CAPSULE | ORAL | Status: AC
  Filled 2019-04-13: qty 1

## 2019-04-13 MED ORDER — ACETAMINOPHEN 325 MG PO TABS
650.0000 mg | ORAL_TABLET | Freq: Once | ORAL | Status: AC
  Administered 2019-04-13: 650 mg via ORAL

## 2019-04-13 NOTE — Patient Instructions (Signed)
[facility] Discharge Instructions for Patients Receiving Chemotherapy  Today you received the following chemotherapy agents Rituximab (RITUXAN).  To help prevent nausea and vomiting after your treatment, we encourage you to take your nausea medication as prescribed.   If you develop nausea and vomiting that is not controlled by your nausea medication, call the clinic.   BELOW ARE SYMPTOMS THAT SHOULD BE REPORTED IMMEDIATELY:  *FEVER GREATER THAN 100.5 F  *CHILLS WITH OR WITHOUT FEVER  NAUSEA AND VOMITING THAT IS NOT CONTROLLED WITH YOUR NAUSEA MEDICATION  *UNUSUAL SHORTNESS OF BREATH  *UNUSUAL BRUISING OR BLEEDING  TENDERNESS IN MOUTH AND THROAT WITH OR WITHOUT PRESENCE OF ULCERS  *URINARY PROBLEMS  *BOWEL PROBLEMS  UNUSUAL RASH Items with * indicate a potential emergency and should be followed up as soon as possible.  Feel free to call the clinic should you have any questions or concerns. The clinic phone number is [phone].  Please show the CHEMO ALERT CARD at check-in to the Emergency Department and triage nurse.  Coronavirus (COVID-19) Are you at risk?  Are you at risk for the Coronavirus (COVID-19)?  To be considered HIGH RISK for Coronavirus (COVID-19), you have to meet the following criteria:  . Traveled to [place], [place], [place], [place] or [place]; or in the [place] to [place], [place], [place], or [place]; and have fever, cough, and shortness of breath within the last 2 weeks of travel OR . Been in close contact with a person diagnosed with COVID-19 within the last 2 weeks and have fever, cough, and shortness of breath . IF YOU DO NOT MEET THESE CRITERIA, YOU ARE CONSIDERED LOW RISK FOR COVID-19.  What to do if you are HIGH RISK for COVID-19?  . If you are having a medical emergency, call 911. . Seek medical care right away. Before you go to a doctor's office, urgent care or emergency department, call ahead and tell them  about your recent travel, contact with someone diagnosed with COVID-19, and your symptoms. You should receive instructions from your physician's office regarding next steps of care.  . When you arrive at healthcare provider, tell the healthcare staff immediately you have returned from visiting [place], [place], [place], [place] or [place]; or traveled in the [place] to [place], [place], [place], or [place]; in the last two weeks or you have been in close contact with a person diagnosed with COVID-19 in the last 2 weeks.   . Tell the health care staff about your symptoms: fever, cough and shortness of breath. . After you have been seen by a medical provider, you will be either: o Tested for (COVID-19) and discharged home on quarantine except to seek medical care if symptoms worsen, and asked to  - Stay home and avoid contact with others until you get your results (4-5 days)  - Avoid travel on public transportation if possible (such as bus, train, or airplane) or o Sent to the Emergency Department by EMS for evaluation, COVID-19 testing, and possible admission depending on your condition and test results.  What to do if you are LOW RISK for COVID-19?  Reduce your risk of any infection by using the same precautions used for avoiding the common cold or flu:  . Wash your hands often with soap and warm water for at least 20 seconds.  If soap and water are not readily available, use an alcohol-based hand sanitizer with at least 60% alcohol.  . If coughing or   sneezing, cover your mouth and nose by coughing or sneezing into the elbow areas of your shirt or coat, into a tissue or into your sleeve (not your hands). . Avoid shaking hands with others and consider head nods or verbal greetings only. . Avoid touching your eyes, nose, or mouth with unwashed hands.  . Avoid close contact with people who are sick. . Avoid places or events with large numbers of people in one location, like concerts or  sporting events. . Carefully consider travel plans you have or are making. . If you are planning any travel outside or inside the [place], visit the CDC's Travelers' Health webpage for the latest health notices. . If you have some symptoms but not all symptoms, continue to monitor at home and seek medical attention if your symptoms worsen. . If you are having a medical emergency, call 911.   [place] / e-Visit: [email or web address]         [facility]: [facility]: [phone]                   [facility]: [phone]

## 2019-04-14 ENCOUNTER — Telehealth: Payer: Self-pay | Admitting: Oncology

## 2019-04-14 LAB — HEPATITIS B SURFACE ANTIGEN: Hepatitis B Surface Ag: NEGATIVE

## 2019-04-14 LAB — HEPATITIS C ANTIBODY: HCV Ab: 0.1 s/co ratio (ref 0.0–0.9)

## 2019-04-14 LAB — IGG, IGA, IGM
IgA: 244 mg/dL (ref 61–437)
IgG (Immunoglobin G), Serum: 1168 mg/dL (ref 603–1613)
IgM (Immunoglobulin M), Srm: 176 mg/dL — ABNORMAL HIGH (ref 20–172)

## 2019-04-14 LAB — HEPATITIS B CORE ANTIBODY, IGM: Hep B C IgM: NEGATIVE

## 2019-04-14 LAB — BETA 2 MICROGLOBULIN, SERUM: Beta-2 Microglobulin: 3.4 mg/L — ABNORMAL HIGH (ref 0.6–2.4)

## 2019-04-14 NOTE — Telephone Encounter (Signed)
Tried to reach regarding schedule °

## 2019-06-08 ENCOUNTER — Other Ambulatory Visit: Payer: Self-pay

## 2019-06-08 ENCOUNTER — Inpatient Hospital Stay: Payer: 59

## 2019-06-08 ENCOUNTER — Inpatient Hospital Stay: Payer: 59 | Attending: Oncology

## 2019-06-08 VITALS — BP 149/83 | HR 72 | Temp 98.3°F | Resp 17

## 2019-06-08 DIAGNOSIS — C9110 Chronic lymphocytic leukemia of B-cell type not having achieved remission: Secondary | ICD-10-CM

## 2019-06-08 DIAGNOSIS — Z5112 Encounter for antineoplastic immunotherapy: Secondary | ICD-10-CM | POA: Diagnosis present

## 2019-06-08 DIAGNOSIS — C9111 Chronic lymphocytic leukemia of B-cell type in remission: Secondary | ICD-10-CM | POA: Insufficient documentation

## 2019-06-08 LAB — COMPREHENSIVE METABOLIC PANEL
ALT: 32 U/L (ref 0–44)
AST: 29 U/L (ref 15–41)
Albumin: 4.5 g/dL (ref 3.5–5.0)
Alkaline Phosphatase: 92 U/L (ref 38–126)
Anion gap: 12 (ref 5–15)
BUN: 29 mg/dL — ABNORMAL HIGH (ref 8–23)
CO2: 26 mmol/L (ref 22–32)
Calcium: 9 mg/dL (ref 8.9–10.3)
Chloride: 102 mmol/L (ref 98–111)
Creatinine, Ser: 1.48 mg/dL — ABNORMAL HIGH (ref 0.61–1.24)
GFR calc Af Amer: 57 mL/min — ABNORMAL LOW (ref >60)
GFR calc non Af Amer: 49 mL/min — ABNORMAL LOW (ref >60)
Glucose, Bld: 94 mg/dL (ref 70–99)
Potassium: 4 mmol/L (ref 3.5–5.1)
Sodium: 140 mmol/L (ref 135–145)
Total Bilirubin: 1.2 mg/dL (ref 0.3–1.2)
Total Protein: 8 g/dL (ref 6.5–8.1)

## 2019-06-08 LAB — CBC WITH DIFFERENTIAL/PLATELET
Abs Immature Granulocytes: 0.03 10*3/uL (ref 0.00–0.07)
Basophils Absolute: 0 10*3/uL (ref 0.0–0.1)
Basophils Relative: 0 %
Eosinophils Absolute: 0.1 10*3/uL (ref 0.0–0.5)
Eosinophils Relative: 2 %
HCT: 40.4 % (ref 39.0–52.0)
Hemoglobin: 12.7 g/dL — ABNORMAL LOW (ref 13.0–17.0)
Immature Granulocytes: 0 %
Lymphocytes Relative: 26 %
Lymphs Abs: 1.8 10*3/uL (ref 0.7–4.0)
MCH: 27.1 pg (ref 26.0–34.0)
MCHC: 31.4 g/dL (ref 30.0–36.0)
MCV: 86.1 fL (ref 80.0–100.0)
Monocytes Absolute: 0.4 10*3/uL (ref 0.1–1.0)
Monocytes Relative: 6 %
Neutro Abs: 4.4 10*3/uL (ref 1.7–7.7)
Neutrophils Relative %: 66 %
Platelets: 116 10*3/uL — ABNORMAL LOW (ref 150–400)
RBC: 4.69 MIL/uL (ref 4.22–5.81)
RDW: 13.9 % (ref 11.5–15.5)
WBC: 6.8 10*3/uL (ref 4.0–10.5)
nRBC: 0 % (ref 0.0–0.2)

## 2019-06-08 LAB — LACTATE DEHYDROGENASE: LDH: 147 U/L (ref 98–192)

## 2019-06-08 MED ORDER — ACETAMINOPHEN 325 MG PO TABS
650.0000 mg | ORAL_TABLET | Freq: Once | ORAL | Status: AC
  Administered 2019-06-08: 650 mg via ORAL

## 2019-06-08 MED ORDER — FAMOTIDINE IN NACL 20-0.9 MG/50ML-% IV SOLN
20.0000 mg | Freq: Once | INTRAVENOUS | Status: AC
  Administered 2019-06-08: 20 mg via INTRAVENOUS

## 2019-06-08 MED ORDER — SODIUM CHLORIDE 0.9 % IV SOLN
375.0000 mg/m2 | Freq: Once | INTRAVENOUS | Status: AC
  Administered 2019-06-08: 900 mg via INTRAVENOUS
  Filled 2019-06-08: qty 50

## 2019-06-08 MED ORDER — FAMOTIDINE IN NACL 20-0.9 MG/50ML-% IV SOLN
INTRAVENOUS | Status: AC
  Filled 2019-06-08: qty 50

## 2019-06-08 MED ORDER — ACETAMINOPHEN 325 MG PO TABS
ORAL_TABLET | ORAL | Status: AC
  Filled 2019-06-08: qty 2

## 2019-06-08 MED ORDER — DIPHENHYDRAMINE HCL 25 MG PO CAPS
25.0000 mg | ORAL_CAPSULE | Freq: Once | ORAL | Status: AC
  Administered 2019-06-08: 25 mg via ORAL

## 2019-06-08 MED ORDER — DIPHENHYDRAMINE HCL 25 MG PO CAPS
ORAL_CAPSULE | ORAL | Status: AC
  Filled 2019-06-08: qty 1

## 2019-06-08 MED ORDER — SODIUM CHLORIDE 0.9 % IV SOLN
Freq: Once | INTRAVENOUS | Status: AC
  Administered 2019-06-08: 09:00:00 via INTRAVENOUS
  Filled 2019-06-08: qty 250

## 2019-06-08 NOTE — Patient Instructions (Signed)
[facility] Discharge Instructions for Patients Receiving Chemotherapy  Today you received the following chemotherapy agents Rituximab (RITUXAN).  To help prevent nausea and vomiting after your treatment, we encourage you to take your nausea medication as prescribed.   If you develop nausea and vomiting that is not controlled by your nausea medication, call the clinic.   BELOW ARE SYMPTOMS THAT SHOULD BE REPORTED IMMEDIATELY:  *FEVER GREATER THAN 100.5 F  *CHILLS WITH OR WITHOUT FEVER  NAUSEA AND VOMITING THAT IS NOT CONTROLLED WITH YOUR NAUSEA MEDICATION  *UNUSUAL SHORTNESS OF BREATH  *UNUSUAL BRUISING OR BLEEDING  TENDERNESS IN MOUTH AND THROAT WITH OR WITHOUT PRESENCE OF ULCERS  *URINARY PROBLEMS  *BOWEL PROBLEMS  UNUSUAL RASH Items with * indicate a potential emergency and should be followed up as soon as possible.  Feel free to call the clinic should you have any questions or concerns. The clinic phone number is [phone].  Please show the CHEMO ALERT CARD at check-in to the Emergency Department and triage nurse.   

## 2019-06-09 LAB — BETA 2 MICROGLOBULIN, SERUM: Beta-2 Microglobulin: 3 mg/L — ABNORMAL HIGH (ref 0.6–2.4)

## 2019-07-21 ENCOUNTER — Telehealth: Payer: Self-pay | Admitting: Oncology

## 2019-07-21 NOTE — Telephone Encounter (Signed)
Called patient regarding infusion log, informed patient appointments time on 09/09 has been changed. °

## 2019-08-03 ENCOUNTER — Ambulatory Visit: Payer: 59

## 2019-08-03 ENCOUNTER — Inpatient Hospital Stay: Payer: Managed Care, Other (non HMO)

## 2019-08-03 ENCOUNTER — Other Ambulatory Visit: Payer: Self-pay

## 2019-08-03 ENCOUNTER — Inpatient Hospital Stay: Payer: Managed Care, Other (non HMO) | Attending: Oncology

## 2019-08-03 ENCOUNTER — Other Ambulatory Visit: Payer: 59

## 2019-08-03 VITALS — BP 147/68 | HR 63 | Temp 97.5°F | Resp 18 | Wt 222.8 lb

## 2019-08-03 DIAGNOSIS — C9111 Chronic lymphocytic leukemia of B-cell type in remission: Secondary | ICD-10-CM | POA: Diagnosis present

## 2019-08-03 DIAGNOSIS — C9110 Chronic lymphocytic leukemia of B-cell type not having achieved remission: Secondary | ICD-10-CM

## 2019-08-03 DIAGNOSIS — Z5112 Encounter for antineoplastic immunotherapy: Secondary | ICD-10-CM | POA: Insufficient documentation

## 2019-08-03 LAB — COMPREHENSIVE METABOLIC PANEL
ALT: 27 U/L (ref 0–44)
AST: 23 U/L (ref 15–41)
Albumin: 4.4 g/dL (ref 3.5–5.0)
Alkaline Phosphatase: 92 U/L (ref 38–126)
Anion gap: 11 (ref 5–15)
BUN: 23 mg/dL (ref 8–23)
CO2: 26 mmol/L (ref 22–32)
Calcium: 9.4 mg/dL (ref 8.9–10.3)
Chloride: 102 mmol/L (ref 98–111)
Creatinine, Ser: 1.33 mg/dL — ABNORMAL HIGH (ref 0.61–1.24)
GFR calc Af Amer: >60 mL/min (ref >60)
GFR calc non Af Amer: 56 mL/min — ABNORMAL LOW (ref >60)
Glucose, Bld: 105 mg/dL — ABNORMAL HIGH (ref 70–99)
Potassium: 4.2 mmol/L (ref 3.5–5.1)
Sodium: 139 mmol/L (ref 135–145)
Total Bilirubin: 0.9 mg/dL (ref 0.3–1.2)
Total Protein: 7.7 g/dL (ref 6.5–8.1)

## 2019-08-03 LAB — CBC WITH DIFFERENTIAL/PLATELET
Abs Immature Granulocytes: 0.03 10*3/uL (ref 0.00–0.07)
Basophils Absolute: 0 10*3/uL (ref 0.0–0.1)
Basophils Relative: 0 %
Eosinophils Absolute: 0.2 10*3/uL (ref 0.0–0.5)
Eosinophils Relative: 3 %
HCT: 40.3 % (ref 39.0–52.0)
Hemoglobin: 12.4 g/dL — ABNORMAL LOW (ref 13.0–17.0)
Immature Granulocytes: 0 %
Lymphocytes Relative: 17 %
Lymphs Abs: 1.2 10*3/uL (ref 0.7–4.0)
MCH: 26.8 pg (ref 26.0–34.0)
MCHC: 30.8 g/dL (ref 30.0–36.0)
MCV: 87 fL (ref 80.0–100.0)
Monocytes Absolute: 0.3 10*3/uL (ref 0.1–1.0)
Monocytes Relative: 5 %
Neutro Abs: 5 10*3/uL (ref 1.7–7.7)
Neutrophils Relative %: 75 %
Platelets: 117 10*3/uL — ABNORMAL LOW (ref 150–400)
RBC: 4.63 MIL/uL (ref 4.22–5.81)
RDW: 14.2 % (ref 11.5–15.5)
WBC: 6.7 10*3/uL (ref 4.0–10.5)
nRBC: 0 % (ref 0.0–0.2)

## 2019-08-03 LAB — LACTATE DEHYDROGENASE: LDH: 146 U/L (ref 98–192)

## 2019-08-03 MED ORDER — SODIUM CHLORIDE 0.9 % IV SOLN
375.0000 mg/m2 | Freq: Once | INTRAVENOUS | Status: AC
  Administered 2019-08-03: 900 mg via INTRAVENOUS
  Filled 2019-08-03: qty 50

## 2019-08-03 MED ORDER — ACETAMINOPHEN 325 MG PO TABS
650.0000 mg | ORAL_TABLET | Freq: Once | ORAL | Status: AC
  Administered 2019-08-03: 650 mg via ORAL

## 2019-08-03 MED ORDER — DIPHENHYDRAMINE HCL 25 MG PO CAPS
25.0000 mg | ORAL_CAPSULE | Freq: Once | ORAL | Status: AC
  Administered 2019-08-03: 25 mg via ORAL

## 2019-08-03 MED ORDER — SODIUM CHLORIDE 0.9 % IV SOLN
Freq: Once | INTRAVENOUS | Status: AC
  Administered 2019-08-03: 09:00:00 via INTRAVENOUS
  Filled 2019-08-03: qty 250

## 2019-08-03 MED ORDER — DIPHENHYDRAMINE HCL 25 MG PO CAPS
ORAL_CAPSULE | ORAL | Status: AC
  Filled 2019-08-03: qty 1

## 2019-08-03 MED ORDER — FAMOTIDINE IN NACL 20-0.9 MG/50ML-% IV SOLN
INTRAVENOUS | Status: AC
  Filled 2019-08-03: qty 50

## 2019-08-03 MED ORDER — FAMOTIDINE IN NACL 20-0.9 MG/50ML-% IV SOLN
20.0000 mg | Freq: Once | INTRAVENOUS | Status: AC
  Administered 2019-08-03: 20 mg via INTRAVENOUS

## 2019-08-03 MED ORDER — ACETAMINOPHEN 325 MG PO TABS
ORAL_TABLET | ORAL | Status: AC
  Filled 2019-08-03: qty 2

## 2019-08-03 NOTE — Patient Instructions (Signed)
[facility] Discharge Instructions for Patients Receiving Chemotherapy  Today you received the following chemotherapy agents Rituximab (RITUXAN).  To help prevent nausea and vomiting after your treatment, we encourage you to take your nausea medication as prescribed.   If you develop nausea and vomiting that is not controlled by your nausea medication, call the clinic.   BELOW ARE SYMPTOMS THAT SHOULD BE REPORTED IMMEDIATELY:  *FEVER GREATER THAN 100.5 F  *CHILLS WITH OR WITHOUT FEVER  NAUSEA AND VOMITING THAT IS NOT CONTROLLED WITH YOUR NAUSEA MEDICATION  *UNUSUAL SHORTNESS OF BREATH  *UNUSUAL BRUISING OR BLEEDING  TENDERNESS IN MOUTH AND THROAT WITH OR WITHOUT PRESENCE OF ULCERS  *URINARY PROBLEMS  *BOWEL PROBLEMS  UNUSUAL RASH Items with * indicate a potential emergency and should be followed up as soon as possible.  Feel free to call the clinic should you have any questions or concerns. The clinic phone number is [phone].  Please show the CHEMO ALERT CARD at check-in to the Emergency Department and triage nurse.   

## 2019-08-04 LAB — BETA 2 MICROGLOBULIN, SERUM: Beta-2 Microglobulin: 3.5 mg/L — ABNORMAL HIGH (ref 0.6–2.4)

## 2019-09-27 NOTE — Progress Notes (Signed)
[place]  [phone] [phone]     ID: [name] DOB: June 03, 1956  MR#: [id number]  [id number]  Patient Care Team: [name], MD as PCP - General (Family Medicine) [name], Virgie Dad, MD as Consulting Physician (Oncology) Chauncey Cruel, MD OTHER MD:  CHIEF COMPLAINT: Chronic lymphoid leukemia  CURRENT TREATMENT: maintenance Rituximab   INTERVAL HISTORY: Cody [name] returns today for follow-up and treatment of his chronic lymphoid leukemia. He is accompanied by his sister.  He continues on maintenance Rituxumab every two months, with a dose today.  He denies any side effects from the treatment.  He also denies any "B" symptoms in the intervening history.  Since his last visit, he has not undergone any additional studies.   REVIEW OF SYSTEMS: Cody [name] is very active, getting on the treadmill every day, and working in the yard.  He has raked up many piles of leaves he says.  He has been following the election closely.  He and his family are taking appropriate pandemic precautions.  Overall a detailed review of systems today was stable   HISTORY OF CURRENT ILLNESS: From the original intake note:  [name] was evaluated by his primary care physician and found to have a high white cell count.  He was asked to go to the emergency room which the patient did on 11/04/2018 at 8 PM.  The patient was found to be stable, with a white cell count of greater than 150,000, most of which were lymphocytes.  A pathology smear review by Dr. Reuel Derby  showed lymphocytosis and smudge cells.  There were no blasts.  RBCs and platelets were unremarkable.  The patient was referred to our clinic for further evaluation  Mr. Clinkenbeard subsequent history is as detailed below.   PAST MEDICAL HISTORY: Past Medical History:  Diagnosis Date  . Alcoholic cirrhosis ([place])   . Cataract   . Diabetes mellitus without complication ([place])   . History of  alcohol abuse   . Hypercholesterolemia   Hypertension, hypercholesterolemia, cataracts, seasonal allergies, cirrhosis, history of EtOH abuse   PAST SURGICAL HISTORY: No past surgical history on file. Surgery for incarcerated bowel release   FAMILY HISTORY Family History  Problem Relation Age of Onset  . Alcohol abuse Father   . Cirrhosis Father   . Coronary artery disease Father     The patient's father died from cirrhosis at age 7 in the setting of alcohol abuse; he also had significant coronary artery disease.  The patient's mother is living, 24 years old as of December 2019.  The patient has 2 sisters, no brothers.  They are not aware of any cancer history in the family   SOCIAL HISTORY:  The patient used to work in [employer] for the city of [place].  He was briefly married but the marriage has been annulled.  The patient currently lives with his sister [name] and their mother.  A second sister, Charma Igo, lives in [place].   ADVANCED DIRECTIVES: The patient Sister [name] is his healthcare power of attorney.  She can be reached at [phone].   HEALTH MAINTENANCE: Social History   Tobacco Use  . Smoking status: Never Smoker  . Smokeless tobacco: Never Used  Substance Use Topics  . Alcohol use: Not Currently  . Drug use: No    Colonoscopy:  PSA:  Bone density:   No Known Allergies  Current Outpatient Medications  Medication Sig Dispense Refill  . allopurinol (ZYLOPRIM) 300 MG tablet  Take 1 tablet (300 mg total) by mouth daily. 90 tablet 4  . amLODipine (NORVASC) 10 MG tablet Take 10 mg by mouth daily.    Marland Kitchen atorvastatin (LIPITOR) 10 MG tablet Take 10 mg by mouth daily.    . Insulin Glargine, 2 Unit Dial, (TOUJEO MAX SOLOSTAR) 300 UNIT/ML SOPN Inject 12 Units into the skin daily.     . JENTADUETO 2.03-999 MG TABS Take 1 tablet by mouth daily with supper.   3  . metFORMIN (GLUCOPHAGE) 1000 MG tablet Take 1,000 mg by mouth 2 (two) times  daily.     . ondansetron (ZOFRAN) 4 MG tablet Take 1 tablet (4 mg total) by mouth every 8 (eight) hours as needed for nausea or vomiting. 20 tablet 0  . ONETOUCH DELICA LANCETS FINE MISC 3 (three) times daily.   5  . telmisartan-hydrochlorothiazide (MICARDIS HCT) 80-25 MG tablet Take 1 tablet by mouth daily.     No current facility-administered medications for this visit.     OBJECTIVE: Middle-aged African-American man who appears stated age 1:   09/28/19 0845  BP: (!) 147/72  Pulse: 85  Resp: 18  Temp: 98.7 F (37.1 C)  SpO2: 100%     Body mass index is 30.31 kg/m.   Wt Readings from Last 3 Encounters:  09/28/19 223 lb 8 oz (101.4 kg)  08/03/19 222 lb 12 oz (101 kg)  04/13/19 234 lb 12.8 oz (106.5 kg)      ECOG FS:0 - Asymptomatic  Sclerae unicteric, EOMs intact Wearing a mask No cervical or supraclavicular adenopathy, no axillary or inguinal adenopathy Lungs no rales or rhonchi Heart regular rate and rhythm Abd soft, nontender, positive bowel sounds, no palpable splenomegaly MSK no focal spinal tenderness, no upper extremity lymphedema Neuro: nonfocal, well oriented, appropriate affect    LAB RESULTS:  CMP     Component Value Date/Time   NA 141 09/28/2019 0825   K 4.2 09/28/2019 0825   CL 104 09/28/2019 0825   CO2 25 09/28/2019 0825   GLUCOSE 125 (H) 09/28/2019 0825   BUN 35 (H) 09/28/2019 0825   CREATININE 1.47 (H) 09/28/2019 0825   CALCIUM 9.0 09/28/2019 0825   PROT 7.5 09/28/2019 0825   ALBUMIN 4.2 09/28/2019 0825   AST 19 09/28/2019 0825   ALT 21 09/28/2019 0825   ALKPHOS 82 09/28/2019 0825   BILITOT 0.7 09/28/2019 0825   GFRNONAA 50 (L) 09/28/2019 0825   GFRAA 58 (L) 09/28/2019 0825    No results found for: TOTALPROTELP, ALBUMINELP, A1GS, A2GS, BETS, BETA2SER, GAMS, MSPIKE, SPEI  No results found for: KPAFRELGTCHN, LAMBDASER, KAPLAMBRATIO  Lab Results  Component Value Date   WBC 6.4 09/28/2019   NEUTROABS 4.5 09/28/2019   HGB 12.4 (L)  09/28/2019   HCT 38.8 (L) 09/28/2019   MCV 85.8 09/28/2019   PLT 111 (L) 09/28/2019      Chemistry      Component Value Date/Time   NA 141 09/28/2019 0825   K 4.2 09/28/2019 0825   CL 104 09/28/2019 0825   CO2 25 09/28/2019 0825   BUN 35 (H) 09/28/2019 0825   CREATININE 1.47 (H) 09/28/2019 0825      Component Value Date/Time   CALCIUM 9.0 09/28/2019 0825   ALKPHOS 82 09/28/2019 0825   AST 19 09/28/2019 0825   ALT 21 09/28/2019 0825   BILITOT 0.7 09/28/2019 0825       No results found for: LABCA2  No components found for: [id number]  No results for input(s):  INR in the last 168 hours.  No results found for: LABCA2  No results found for: [id number]  No results found for: [id number]  No results found for: [id number]  No results found for: CA2729  No components found for: HGQUANT  No results found for: CEA1 / No results found for: CEA1   No results found for: AFPTUMOR  No results found for: CHROMOGRNA  No results found for: PSA1  Appointment on 09/28/2019  Component Date Value Ref Range Status  . Sodium 09/28/2019 141  135 - 145 mmol/L Final  . Potassium 09/28/2019 4.2  3.5 - 5.1 mmol/L Final  . Chloride 09/28/2019 104  98 - 111 mmol/L Final  . CO2 09/28/2019 25  22 - 32 mmol/L Final  . Glucose, Bld 09/28/2019 125* 70 - 99 mg/dL Final  . BUN 09/28/2019 35* 8 - 23 mg/dL Final  . Creatinine, Ser 09/28/2019 1.47* 0.61 - 1.24 mg/dL Final  . Calcium 09/28/2019 9.0  8.9 - 10.3 mg/dL Final  . Total Protein 09/28/2019 7.5  6.5 - 8.1 g/dL Final  . Albumin 09/28/2019 4.2  3.5 - 5.0 g/dL Final  . AST 09/28/2019 19  15 - 41 U/L Final  . ALT 09/28/2019 21  0 - 44 U/L Final  . Alkaline Phosphatase 09/28/2019 82  38 - 126 U/L Final  . Total Bilirubin 09/28/2019 0.7  0.3 - 1.2 mg/dL Final  . GFR calc non Af Amer 09/28/2019 50* >60 mL/min Final  . GFR calc Af Amer 09/28/2019 58* >60 mL/min Final  . Anion gap 09/28/2019 12  5 - 15 Final   Performed at [facility], [place] [address], [place], [place] [address]  . WBC 09/28/2019 6.4  4.0 - 10.5 K/uL Final  . RBC 09/28/2019 4.52  4.22 - 5.81 MIL/uL Final  . Hemoglobin 09/28/2019 12.4* 13.0 - 17.0 g/dL Final  . HCT 09/28/2019 38.8* 39.0 - 52.0 % Final  . MCV 09/28/2019 85.8  80.0 - 100.0 fL Final  . MCH 09/28/2019 27.4  26.0 - 34.0 pg Final  . MCHC 09/28/2019 32.0  30.0 - 36.0 g/dL Final  . RDW 09/28/2019 14.0  11.5 - 15.5 % Final  . Platelets 09/28/2019 111* 150 - 400 K/uL Final  . nRBC 09/28/2019 0.0  0.0 - 0.2 % Final  . Neutrophils Relative % 09/28/2019 71  % Final  . Neutro Abs 09/28/2019 4.5  1.7 - 7.7 K/uL Final  . Lymphocytes Relative 09/28/2019 21  % Final  . Lymphs Abs 09/28/2019 1.4  0.7 - 4.0 K/uL Final  . Monocytes Relative 09/28/2019 6  % Final  . Monocytes Absolute 09/28/2019 0.4  0.1 - 1.0 K/uL Final  . Eosinophils Relative 09/28/2019 2  % Final  . Eosinophils Absolute 09/28/2019 0.2  0.0 - 0.5 K/uL Final  . Basophils Relative 09/28/2019 0  % Final  . Basophils Absolute 09/28/2019 0.0  0.0 - 0.1 K/uL Final  . Immature Granulocytes 09/28/2019 0  % Final  . Abs Immature Granulocytes 09/28/2019 0.02  0.00 - 0.07 K/uL Final   Performed at [facility], [place] [address], [place], [place] [address]    (this displays the last labs from the last 3 days)  No results found for: TOTALPROTELP, ALBUMINELP, A1GS, A2GS, BETS, BETA2SER, GAMS, MSPIKE, SPEI (this displays SPEP labs)  No results found for: KPAFRELGTCHN, LAMBDASER, KAPLAMBRATIO (kappa/lambda light chains)  No results found for: HGBA, HGBA2QUANT, HGBFQUANT, HGBSQUAN (Hemoglobinopathy evaluation)   Lab Results  Component Value Date   LDH 146 08/03/2019    No results found for: IRON, TIBC, IRONPCTSAT (Iron and TIBC)  No results found for: FERRITIN  Urinalysis    Component Value Date/Time   COLORURINE STRAW (A) 11/23/2018 2018   APPEARANCEUR CLEAR 11/23/2018 2018   LABSPEC 1.020  11/23/2018 2018   PHURINE 5.0 11/23/2018 2018   GLUCOSEU >=500 (A) 11/23/2018 2018   [place] 11/23/2018 2018   [place] NEGATIVE 11/23/2018 2018   [place] 11/23/2018 2018   PROTEINUR NEGATIVE 11/23/2018 2018   NITRITE NEGATIVE 11/23/2018 2018   LEUKOCYTESUR NEGATIVE 11/23/2018 2018     STUDIES: No results found.   ELIGIBLE FOR AVAILABLE RESEARCH PROTOCOL: no   ASSESSMENT: 63 y.o. [place], [place] man with a diagnosis of chronic lymphoid leukemia made 11/04/2018  (a) the cells are positive for CD 04/12/19/22/23, kappa restricted  (1) Starting Rituximab weekly x 9 weeks beginning 11/23/2018  (a) reaction to first dose (which was 100 mg only).  (2) on maintenance rituximab, first dose 03/07/2019, repeated every 56 days   PLAN:  Cody [name] is tolerating treatment well.  He has an excellent white cell count, hemoglobin over 12, and a platelet count greater than 100,000.  The plan is to continue rituximab every 2 months for another 2 years at a minimum  He knows to call for any problems that may develop before his next visit with me which will be in 6 months   Sarajane Jews C. Dream Nodal, MD  09/28/19 9:04 AM Medical Oncology and Hematology [facility] [place], [place] [address] Tel. [phone]    Fax. [phone]   I, Wilburn Mylar, am acting as scribe for Dr. Virgie Dad. Leili Eskenazi.  I, Lurline Del MD, have reviewed the above documentation for accuracy and completeness, and I agree with the above.

## 2019-09-28 ENCOUNTER — Inpatient Hospital Stay: Payer: 59

## 2019-09-28 ENCOUNTER — Inpatient Hospital Stay: Payer: 59 | Attending: Oncology

## 2019-09-28 ENCOUNTER — Other Ambulatory Visit: Payer: Self-pay

## 2019-09-28 ENCOUNTER — Inpatient Hospital Stay (HOSPITAL_BASED_OUTPATIENT_CLINIC_OR_DEPARTMENT_OTHER): Payer: 59 | Admitting: Oncology

## 2019-09-28 VITALS — BP 117/68 | HR 67 | Temp 98.9°F | Resp 18

## 2019-09-28 VITALS — BP 147/72 | HR 85 | Temp 98.7°F | Resp 18 | Ht 72.0 in | Wt 223.5 lb

## 2019-09-28 DIAGNOSIS — Z5112 Encounter for antineoplastic immunotherapy: Secondary | ICD-10-CM | POA: Diagnosis not present

## 2019-09-28 DIAGNOSIS — C9110 Chronic lymphocytic leukemia of B-cell type not having achieved remission: Secondary | ICD-10-CM

## 2019-09-28 LAB — CBC WITH DIFFERENTIAL/PLATELET
Abs Immature Granulocytes: 0.02 10*3/uL (ref 0.00–0.07)
Basophils Absolute: 0 10*3/uL (ref 0.0–0.1)
Basophils Relative: 0 %
Eosinophils Absolute: 0.2 10*3/uL (ref 0.0–0.5)
Eosinophils Relative: 2 %
HCT: 38.8 % — ABNORMAL LOW (ref 39.0–52.0)
Hemoglobin: 12.4 g/dL — ABNORMAL LOW (ref 13.0–17.0)
Immature Granulocytes: 0 %
Lymphocytes Relative: 21 %
Lymphs Abs: 1.4 10*3/uL (ref 0.7–4.0)
MCH: 27.4 pg (ref 26.0–34.0)
MCHC: 32 g/dL (ref 30.0–36.0)
MCV: 85.8 fL (ref 80.0–100.0)
Monocytes Absolute: 0.4 10*3/uL (ref 0.1–1.0)
Monocytes Relative: 6 %
Neutro Abs: 4.5 10*3/uL (ref 1.7–7.7)
Neutrophils Relative %: 71 %
Platelets: 111 10*3/uL — ABNORMAL LOW (ref 150–400)
RBC: 4.52 MIL/uL (ref 4.22–5.81)
RDW: 14 % (ref 11.5–15.5)
WBC: 6.4 10*3/uL (ref 4.0–10.5)
nRBC: 0 % (ref 0.0–0.2)

## 2019-09-28 LAB — COMPREHENSIVE METABOLIC PANEL
ALT: 21 U/L (ref 0–44)
AST: 19 U/L (ref 15–41)
Albumin: 4.2 g/dL (ref 3.5–5.0)
Alkaline Phosphatase: 82 U/L (ref 38–126)
Anion gap: 12 (ref 5–15)
BUN: 35 mg/dL — ABNORMAL HIGH (ref 8–23)
CO2: 25 mmol/L (ref 22–32)
Calcium: 9 mg/dL (ref 8.9–10.3)
Chloride: 104 mmol/L (ref 98–111)
Creatinine, Ser: 1.47 mg/dL — ABNORMAL HIGH (ref 0.61–1.24)
GFR calc Af Amer: 58 mL/min — ABNORMAL LOW (ref >60)
GFR calc non Af Amer: 50 mL/min — ABNORMAL LOW (ref >60)
Glucose, Bld: 125 mg/dL — ABNORMAL HIGH (ref 70–99)
Potassium: 4.2 mmol/L (ref 3.5–5.1)
Sodium: 141 mmol/L (ref 135–145)
Total Bilirubin: 0.7 mg/dL (ref 0.3–1.2)
Total Protein: 7.5 g/dL (ref 6.5–8.1)

## 2019-09-28 LAB — LACTATE DEHYDROGENASE: LDH: 113 U/L (ref 98–192)

## 2019-09-28 MED ORDER — DIPHENHYDRAMINE HCL 25 MG PO CAPS
25.0000 mg | ORAL_CAPSULE | Freq: Once | ORAL | Status: AC
  Administered 2019-09-28: 10:00:00 25 mg via ORAL

## 2019-09-28 MED ORDER — FAMOTIDINE IN NACL 20-0.9 MG/50ML-% IV SOLN
20.0000 mg | Freq: Once | INTRAVENOUS | Status: AC
  Administered 2019-09-28: 10:00:00 20 mg via INTRAVENOUS

## 2019-09-28 MED ORDER — ACETAMINOPHEN 325 MG PO TABS
ORAL_TABLET | ORAL | Status: AC
  Filled 2019-09-28: qty 2

## 2019-09-28 MED ORDER — FAMOTIDINE IN NACL 20-0.9 MG/50ML-% IV SOLN
INTRAVENOUS | Status: AC
  Filled 2019-09-28: qty 50

## 2019-09-28 MED ORDER — SODIUM CHLORIDE 0.9 % IV SOLN
Freq: Once | INTRAVENOUS | Status: AC
  Administered 2019-09-28: 10:00:00 via INTRAVENOUS
  Filled 2019-09-28: qty 250

## 2019-09-28 MED ORDER — DIPHENHYDRAMINE HCL 25 MG PO CAPS
ORAL_CAPSULE | ORAL | Status: AC
  Filled 2019-09-28: qty 1

## 2019-09-28 MED ORDER — ACETAMINOPHEN 325 MG PO TABS
650.0000 mg | ORAL_TABLET | Freq: Once | ORAL | Status: AC
  Administered 2019-09-28: 10:00:00 650 mg via ORAL

## 2019-09-28 MED ORDER — SODIUM CHLORIDE 0.9 % IV SOLN
375.0000 mg/m2 | Freq: Once | INTRAVENOUS | Status: AC
  Administered 2019-09-28: 900 mg via INTRAVENOUS
  Filled 2019-09-28: qty 50

## 2019-09-28 NOTE — Patient Instructions (Signed)
[facility] Discharge Instructions for Patients Receiving Chemotherapy  Today you received the following chemotherapy agents Rituximab (RITUXAN).  To help prevent nausea and vomiting after your treatment, we encourage you to take your nausea medication as prescribed.   If you develop nausea and vomiting that is not controlled by your nausea medication, call the clinic.   BELOW ARE SYMPTOMS THAT SHOULD BE REPORTED IMMEDIATELY:  *FEVER GREATER THAN 100.5 F  *CHILLS WITH OR WITHOUT FEVER  NAUSEA AND VOMITING THAT IS NOT CONTROLLED WITH YOUR NAUSEA MEDICATION  *UNUSUAL SHORTNESS OF BREATH  *UNUSUAL BRUISING OR BLEEDING  TENDERNESS IN MOUTH AND THROAT WITH OR WITHOUT PRESENCE OF ULCERS  *URINARY PROBLEMS  *BOWEL PROBLEMS  UNUSUAL RASH Items with * indicate a potential emergency and should be followed up as soon as possible.  Feel free to call the clinic should you have any questions or concerns. The clinic phone number is [phone].  Please show the CHEMO ALERT CARD at check-in to the Emergency Department and triage nurse.   

## 2019-09-29 ENCOUNTER — Telehealth: Payer: Self-pay | Admitting: Oncology

## 2019-09-29 LAB — BETA 2 MICROGLOBULIN, SERUM: Beta-2 Microglobulin: 3.4 mg/L — ABNORMAL HIGH (ref 0.6–2.4)

## 2019-09-29 NOTE — Telephone Encounter (Signed)
I could not reach patient regarding schedule  °

## 2019-11-23 ENCOUNTER — Inpatient Hospital Stay: Payer: 59 | Attending: Oncology

## 2019-11-23 ENCOUNTER — Other Ambulatory Visit: Payer: Self-pay

## 2019-11-23 ENCOUNTER — Inpatient Hospital Stay: Payer: 59

## 2019-11-23 VITALS — BP 138/69 | HR 67 | Temp 98.0°F | Resp 18 | Wt 226.0 lb

## 2019-11-23 DIAGNOSIS — Z5112 Encounter for antineoplastic immunotherapy: Secondary | ICD-10-CM | POA: Insufficient documentation

## 2019-11-23 DIAGNOSIS — C9110 Chronic lymphocytic leukemia of B-cell type not having achieved remission: Secondary | ICD-10-CM | POA: Diagnosis present

## 2019-11-23 LAB — COMPREHENSIVE METABOLIC PANEL
ALT: 31 U/L (ref 0–44)
AST: 23 U/L (ref 15–41)
Albumin: 4.3 g/dL (ref 3.5–5.0)
Alkaline Phosphatase: 92 U/L (ref 38–126)
Anion gap: 12 (ref 5–15)
BUN: 21 mg/dL (ref 8–23)
CO2: 27 mmol/L (ref 22–32)
Calcium: 9.3 mg/dL (ref 8.9–10.3)
Chloride: 102 mmol/L (ref 98–111)
Creatinine, Ser: 1.26 mg/dL — ABNORMAL HIGH (ref 0.61–1.24)
GFR calc Af Amer: >60 mL/min (ref >60)
GFR calc non Af Amer: 60 mL/min — ABNORMAL LOW (ref >60)
Glucose, Bld: 126 mg/dL — ABNORMAL HIGH (ref 70–99)
Potassium: 4.3 mmol/L (ref 3.5–5.1)
Sodium: 141 mmol/L (ref 135–145)
Total Bilirubin: 0.7 mg/dL (ref 0.3–1.2)
Total Protein: 7.8 g/dL (ref 6.5–8.1)

## 2019-11-23 LAB — CBC WITH DIFFERENTIAL/PLATELET
Abs Immature Granulocytes: 0.04 10*3/uL (ref 0.00–0.07)
Basophils Absolute: 0 10*3/uL (ref 0.0–0.1)
Basophils Relative: 0 %
Eosinophils Absolute: 0.2 10*3/uL (ref 0.0–0.5)
Eosinophils Relative: 2 %
HCT: 40.4 % (ref 39.0–52.0)
Hemoglobin: 12.8 g/dL — ABNORMAL LOW (ref 13.0–17.0)
Immature Granulocytes: 1 %
Lymphocytes Relative: 18 %
Lymphs Abs: 1.2 10*3/uL (ref 0.7–4.0)
MCH: 27.6 pg (ref 26.0–34.0)
MCHC: 31.7 g/dL (ref 30.0–36.0)
MCV: 87.3 fL (ref 80.0–100.0)
Monocytes Absolute: 0.3 10*3/uL (ref 0.1–1.0)
Monocytes Relative: 5 %
Neutro Abs: 4.9 10*3/uL (ref 1.7–7.7)
Neutrophils Relative %: 74 %
Platelets: 117 10*3/uL — ABNORMAL LOW (ref 150–400)
RBC: 4.63 MIL/uL (ref 4.22–5.81)
RDW: 13.8 % (ref 11.5–15.5)
WBC: 6.6 10*3/uL (ref 4.0–10.5)
nRBC: 0 % (ref 0.0–0.2)

## 2019-11-23 LAB — LACTATE DEHYDROGENASE: LDH: 155 U/L (ref 98–192)

## 2019-11-23 MED ORDER — ACETAMINOPHEN 325 MG PO TABS
ORAL_TABLET | ORAL | Status: AC
  Filled 2019-11-23: qty 2

## 2019-11-23 MED ORDER — ACETAMINOPHEN 325 MG PO TABS
650.0000 mg | ORAL_TABLET | Freq: Once | ORAL | Status: AC
  Administered 2019-11-23: 650 mg via ORAL

## 2019-11-23 MED ORDER — SODIUM CHLORIDE 0.9 % IV SOLN
375.0000 mg/m2 | Freq: Once | INTRAVENOUS | Status: AC
  Administered 2019-11-23: 900 mg via INTRAVENOUS
  Filled 2019-11-23: qty 50

## 2019-11-23 MED ORDER — DIPHENHYDRAMINE HCL 25 MG PO CAPS
ORAL_CAPSULE | ORAL | Status: AC
  Filled 2019-11-23: qty 1

## 2019-11-23 MED ORDER — FAMOTIDINE IN NACL 20-0.9 MG/50ML-% IV SOLN
INTRAVENOUS | Status: AC
  Filled 2019-11-23: qty 50

## 2019-11-23 MED ORDER — SODIUM CHLORIDE 0.9 % IV SOLN
Freq: Once | INTRAVENOUS | Status: AC
  Filled 2019-11-23: qty 250

## 2019-11-23 MED ORDER — DIPHENHYDRAMINE HCL 25 MG PO CAPS
25.0000 mg | ORAL_CAPSULE | Freq: Once | ORAL | Status: AC
  Administered 2019-11-23: 25 mg via ORAL

## 2019-11-23 MED ORDER — FAMOTIDINE IN NACL 20-0.9 MG/50ML-% IV SOLN
20.0000 mg | Freq: Once | INTRAVENOUS | Status: AC
  Administered 2019-11-23: 20 mg via INTRAVENOUS

## 2019-11-23 NOTE — Patient Instructions (Signed)
[facility] Discharge Instructions for Patients Receiving Chemotherapy  Today you received the following chemotherapy agents Rituximab (RITUXAN).  To help prevent nausea and vomiting after your treatment, we encourage you to take your nausea medication as prescribed.   If you develop nausea and vomiting that is not controlled by your nausea medication, call the clinic.   BELOW ARE SYMPTOMS THAT SHOULD BE REPORTED IMMEDIATELY:  *FEVER GREATER THAN 100.5 F  *CHILLS WITH OR WITHOUT FEVER  NAUSEA AND VOMITING THAT IS NOT CONTROLLED WITH YOUR NAUSEA MEDICATION  *UNUSUAL SHORTNESS OF BREATH  *UNUSUAL BRUISING OR BLEEDING  TENDERNESS IN MOUTH AND THROAT WITH OR WITHOUT PRESENCE OF ULCERS  *URINARY PROBLEMS  *BOWEL PROBLEMS  UNUSUAL RASH Items with * indicate a potential emergency and should be followed up as soon as possible.  Feel free to call the clinic should you have any questions or concerns. The clinic phone number is [phone].  Please show the CHEMO ALERT CARD at check-in to the Emergency Department and triage nurse.   

## 2019-11-24 ENCOUNTER — Other Ambulatory Visit: Payer: Self-pay | Admitting: Oncology

## 2019-12-07 ENCOUNTER — Encounter: Payer: Self-pay | Admitting: Podiatry

## 2019-12-07 ENCOUNTER — Ambulatory Visit: Payer: PRIVATE HEALTH INSURANCE | Admitting: Podiatry

## 2019-12-07 ENCOUNTER — Other Ambulatory Visit: Payer: Self-pay

## 2019-12-07 DIAGNOSIS — B351 Tinea unguium: Secondary | ICD-10-CM

## 2019-12-07 DIAGNOSIS — E1151 Type 2 diabetes mellitus with diabetic peripheral angiopathy without gangrene: Secondary | ICD-10-CM

## 2019-12-07 NOTE — Progress Notes (Signed)
Complaint:  Visit Type: Patient returns to my office for continued preventative foot care services. Complaint: Patient states" my nails have grown long and thick and become painful to walk and wear shoes" Patient has been diagnosed with DM with no foot complications. The patient presents for preventative foot care services.  Podiatric Exam: Vascular: dorsalis pedis  are palpable bilateral.  Posterior tibial pulses are absent  B/L. Capillary return is immediate. Temperature gradient is WNL. Skin turgor WNL  Sensorium: Normal Semmes Weinstein monofilament test. Normal tactile sensation bilaterally. Nail Exam: Pt has thick disfigured discolored nails with subungual debris noted bilateral entire nail hallux through fifth toenails Ulcer Exam: There is no evidence of ulcer or pre-ulcerative changes or infection. Orthopedic Exam: Muscle tone and strength are WNL. No limitations in general ROM. No crepitus or effusions noted. Foot type and digits show no abnormalities. Bony prominences are unremarkable. Skin: No Porokeratosis. No infection or ulcers  Diagnosis:  Onychomycosis, , Pain in right toe, pain in left toes  Treatment & Plan Procedures and Treatment: Consent by patient was obtained for treatment procedures.   Debridement of mycotic and hypertrophic toenails, 1 through 5 bilateral and clearing of subungual debris. No ulceration, no infection noted.  Return Visit-Office Procedure: Patient instructed to return to the office for a follow up visit 3 months for continued evaluation and treatment.    Gardiner Barefoot DPM

## 2019-12-28 ENCOUNTER — Other Ambulatory Visit: Payer: Self-pay | Admitting: Oncology

## 2020-01-16 ENCOUNTER — Other Ambulatory Visit: Payer: Self-pay | Admitting: Oncology

## 2020-01-16 ENCOUNTER — Telehealth: Payer: Self-pay

## 2020-01-16 DIAGNOSIS — C9110 Chronic lymphocytic leukemia of B-cell type not having achieved remission: Secondary | ICD-10-CM

## 2020-01-16 DIAGNOSIS — C8300 Small cell B-cell lymphoma, unspecified site: Secondary | ICD-10-CM

## 2020-01-16 NOTE — Telephone Encounter (Signed)
RN left message for patient to call back.  

## 2020-01-16 NOTE — Progress Notes (Signed)
[name] insurance insists that he get a PET scan; I have discussed with billing and pharmacy and my nurse will let the patient know that he will be having a PET scan soon

## 2020-01-17 ENCOUNTER — Telehealth: Payer: Self-pay | Admitting: Oncology

## 2020-01-17 NOTE — Telephone Encounter (Signed)
R/s appt per 2/22 sch message - pt sister aware of appt date and time

## 2020-01-18 ENCOUNTER — Other Ambulatory Visit: Payer: Self-pay | Admitting: Oncology

## 2020-01-18 ENCOUNTER — Inpatient Hospital Stay: Payer: PRIVATE HEALTH INSURANCE

## 2020-01-18 NOTE — Progress Notes (Signed)
We are changing Cody [name] treatments to every 3 months as requested by his insurance.  I do not anticipate any major issue with this change.

## 2020-02-01 ENCOUNTER — Encounter: Payer: Self-pay | Admitting: Oncology

## 2020-02-07 ENCOUNTER — Telehealth: Payer: Self-pay | Admitting: *Deleted

## 2020-02-07 ENCOUNTER — Telehealth: Payer: Self-pay | Admitting: Adult Health

## 2020-02-07 NOTE — Progress Notes (Deleted)
[place]  [phone]) [phone] [phone]) [phone]     ID: ELLWOOD EADE DOB: [birth year]  MR#: [id number]  [id number]  Patient Care Team: [name], MD as PCP - General (Family Medicine) Magrinat, Virgie Dad, MD as Consulting Physician (Oncology) Scot Dock, NP OTHER MD:  CHIEF COMPLAINT: Chronic lymphoid leukemia  CURRENT TREATMENT: maintenance Rituximab   INTERVAL HISTORY: [name] returns today for follow-up and treatment of his chronic lymphoid leukemia.   He continues on Maintenance rituximab every 2 months.    REVIEW OF SYSTEMS: Billy    HISTORY OF CURRENT ILLNESS: From the original intake note:  Mr. Conn was evaluated by his primary care physician and found to have a high white cell count.  He was asked to go to the emergency room which the patient did on 11/04/2018 at 8 PM.  The patient was found to be stable, with a white cell count of greater than 150,000, most of which were lymphocytes.  A pathology smear review by Dr. Reuel Derby  showed lymphocytosis and smudge cells.  There were no blasts.  RBCs and platelets were unremarkable.  The patient was referred to our clinic for further evaluation  Mr. Tawil subsequent history is as detailed below.   PAST MEDICAL HISTORY: Past Medical History:  Diagnosis Date  . Alcoholic cirrhosis ([place])   . Cataract   . Diabetes mellitus without complication ([place])   . History of alcohol abuse   . Hypercholesterolemia   Hypertension, hypercholesterolemia, cataracts, seasonal allergies, cirrhosis, history of EtOH abuse   PAST SURGICAL HISTORY: No past surgical history on file. Surgery for incarcerated bowel release   FAMILY HISTORY Family History  Problem Relation Age of Onset  . Alcohol abuse Father   . Cirrhosis Father   . Coronary artery disease Father     The patient's father died from cirrhosis at age 75 in the setting of alcohol abuse; he also had significant coronary  artery disease.  The patient's mother is living, 78 years old as of December 2019.  The patient has 2 sisters, no brothers.  They are not aware of any cancer history in the family   SOCIAL HISTORY:  The patient used to work in [employer] for the city of [place].  He was briefly married but the marriage has been annulled.  The patient currently lives with his sister Maddin Greenlees and their mother.  A second sister, Charma Igo, lives in [place].   ADVANCED DIRECTIVES: The patient Sister [name] is his healthcare power of attorney.  She can be reached at [phone].   HEALTH MAINTENANCE: Social History   Tobacco Use  . Smoking status: Never Smoker  . Smokeless tobacco: Never Used  Substance Use Topics  . Alcohol use: Not Currently  . Drug use: No    Colonoscopy:  PSA:  Bone density:   No Known Allergies  Current Outpatient Medications  Medication Sig Dispense Refill  . allopurinol (ZYLOPRIM) 300 MG tablet TAKE 1 TABLET DAILY 90 tablet 3  . amLODipine (NORVASC) 10 MG tablet Take 10 mg by mouth daily.    Marland Kitchen atorvastatin (LIPITOR) 10 MG tablet Take 10 mg by mouth daily.    . Continuous Blood Gluc Receiver (FREESTYLE LIBRE 14 DAY READER) DEVI USE AS DIRECTED TO CHECK BLOOD SUGAR    . Continuous Blood Gluc Sensor (FREESTYLE LIBRE 14 DAY SENSOR) MISC USE AS DIRECTED TO CHECK BLOOD SUGAR    . FREESTYLE LITE test strip     . JENTADUETO 2.03-999  MG TABS Take 1 tablet by mouth daily with supper.   3  . metFORMIN (GLUCOPHAGE) 1000 MG tablet Take 1,000 mg by mouth 2 (two) times daily.     . ondansetron (ZOFRAN) 4 MG tablet TAKE 1 TABLET BY MOUTH EVERY 8 HOURS AS NEEDED FOR NAUSEA AND VOMITING 8 tablet 2  . ONETOUCH DELICA LANCETS FINE MISC 3 (three) times daily.   5  . telmisartan-hydrochlorothiazide (MICARDIS HCT) 80-25 MG tablet Take 1 tablet by mouth daily.    [name] SOLOSTAR 300 UNIT/ML SOPN      No current facility-administered medications for this visit.     OBJECTIVE: Middle-aged African-American man who appears stated age There were no vitals filed for this visit.   There is no height or weight on file to calculate BMI.   Wt Readings from Last 3 Encounters:  11/23/19 226 lb (102.5 kg)  09/28/19 223 lb 8 oz (101.4 kg)  08/03/19 222 lb 12 oz (101 kg)      ECOG FS:0 - Asymptomatic GENERAL: Patient is a well appearing male in no acute distress HEENT:  Sclerae anicteric.  Oropharynx clear and moist. No ulcerations or evidence of oropharyngeal candidiasis. Neck is supple.  NODES:  No cervical, supraclavicular, or axillary lymphadenopathy palpated.  BREAST EXAM:  Deferred. LUNGS:  Clear to auscultation bilaterally.  No wheezes or rhonchi. HEART:  Regular rate and rhythm. No murmur appreciated. ABDOMEN:  Soft, nontender.  Positive, normoactive bowel sounds. No organomegaly palpated. MSK:  No focal spinal tenderness to palpation. Full range of motion bilaterally in the upper extremities. EXTREMITIES:  No peripheral edema.   SKIN:  Clear with no obvious rashes or skin changes. No nail dyscrasia. NEURO:  Nonfocal. Well oriented.  Appropriate affect.      LAB RESULTS:  CMP     Component Value Date/Time   NA 141 11/23/2019 0822   K 4.3 11/23/2019 0822   CL 102 11/23/2019 0822   CO2 27 11/23/2019 0822   GLUCOSE 126 (H) 11/23/2019 0822   BUN 21 11/23/2019 0822   CREATININE 1.26 (H) 11/23/2019 0822   CALCIUM 9.3 11/23/2019 0822   PROT 7.8 11/23/2019 0822   ALBUMIN 4.3 11/23/2019 0822   AST 23 11/23/2019 0822   ALT 31 11/23/2019 0822   ALKPHOS 92 11/23/2019 0822   BILITOT 0.7 11/23/2019 0822   GFRNONAA 60 (L) 11/23/2019 0822   GFRAA >60 11/23/2019 0822    No results found for: TOTALPROTELP, ALBUMINELP, A1GS, A2GS, BETS, BETA2SER, GAMS, MSPIKE, SPEI  No results found for: Nils Pyle, [facility]  Lab Results  Component Value Date   WBC 6.6 11/23/2019   NEUTROABS 4.9 11/23/2019   HGB 12.8 (L) 11/23/2019   HCT  40.4 11/23/2019   MCV 87.3 11/23/2019   PLT 117 (L) 11/23/2019      Chemistry      Component Value Date/Time   NA 141 11/23/2019 0822   K 4.3 11/23/2019 0822   CL 102 11/23/2019 0822   CO2 27 11/23/2019 0822   BUN 21 11/23/2019 0822   CREATININE 1.26 (H) 11/23/2019 0822      Component Value Date/Time   CALCIUM 9.3 11/23/2019 0822   ALKPHOS 92 11/23/2019 0822   AST 23 11/23/2019 0822   ALT 31 11/23/2019 0822   BILITOT 0.7 11/23/2019 0822       No results found for: LABCA2  No components found for: [id number]  No results for input(s): INR in the last 168 hours.  No results found for:  LABCA2  No results found for: [id number]  No results found for: [id number]  No results found for: [id number]  No results found for: CA2729  No components found for: HGQUANT  No results found for: CEA1 / No results found for: CEA1   No results found for: AFPTUMOR  No results found for: CHROMOGRNA  No results found for: PSA1  No visits with results within 3 Day(s) from this visit.  Latest known visit with results is:  Appointment on 11/23/2019  Component Date Value Ref Range Status  . LDH 11/23/2019 155  98 - 192 U/L Final   Performed at [facility], [place] [address] [place], [place] [address]  . Sodium 11/23/2019 141  135 - 145 mmol/L Final  . Potassium 11/23/2019 4.3  3.5 - 5.1 mmol/L Final  . Chloride 11/23/2019 102  98 - 111 mmol/L Final  . CO2 11/23/2019 27  22 - 32 mmol/L Final  . Glucose, Bld 11/23/2019 126* 70 - 99 mg/dL Final  . BUN 11/23/2019 21  8 - 23 mg/dL Final  . Creatinine, Ser 11/23/2019 1.26* 0.61 - 1.24 mg/dL Final  . Calcium 11/23/2019 9.3  8.9 - 10.3 mg/dL Final  . Total Protein 11/23/2019 7.8  6.5 - 8.1 g/dL Final  . Albumin 11/23/2019 4.3  3.5 - 5.0 g/dL Final  . AST 11/23/2019 23  15 - 41 U/L Final  . ALT 11/23/2019 31  0 - 44 U/L Final  . Alkaline Phosphatase 11/23/2019 92  38 - 126 U/L Final  . Total Bilirubin 11/23/2019 0.7  0.3 -  1.2 mg/dL Final  . GFR calc non Af Amer 11/23/2019 60* >60 mL/min Final  . GFR calc Af Amer 11/23/2019 >60  >60 mL/min Final  . Anion gap 11/23/2019 12  5 - 15 Final   Performed at [facility], [place] [address] [place], [place] [address]  . WBC 11/23/2019 6.6  4.0 - 10.5 K/uL Final  . RBC 11/23/2019 4.63  4.22 - 5.81 MIL/uL Final  . Hemoglobin 11/23/2019 12.8* 13.0 - 17.0 g/dL Final  . HCT 11/23/2019 40.4  39.0 - 52.0 % Final  . MCV 11/23/2019 87.3  80.0 - 100.0 fL Final  . MCH 11/23/2019 27.6  26.0 - 34.0 pg Final  . MCHC 11/23/2019 31.7  30.0 - 36.0 g/dL Final  . RDW 11/23/2019 13.8  11.5 - 15.5 % Final  . Platelets 11/23/2019 117* 150 - 400 K/uL Final  . nRBC 11/23/2019 0.0  0.0 - 0.2 % Final  . Neutrophils Relative % 11/23/2019 74  % Final  . Neutro Abs 11/23/2019 4.9  1.7 - 7.7 K/uL Final  . Lymphocytes Relative 11/23/2019 18  % Final  . Lymphs Abs 11/23/2019 1.2  0.7 - 4.0 K/uL Final  . Monocytes Relative 11/23/2019 5  % Final  . Monocytes Absolute 11/23/2019 0.3  0.1 - 1.0 K/uL Final  . Eosinophils Relative 11/23/2019 2  % Final  . Eosinophils Absolute 11/23/2019 0.2  0.0 - 0.5 K/uL Final  . Basophils Relative 11/23/2019 0  % Final  . Basophils Absolute 11/23/2019 0.0  0.0 - 0.1 K/uL Final  . Immature Granulocytes 11/23/2019 1  % Final  . Abs Immature Granulocytes 11/23/2019 0.04  0.00 - 0.07 K/uL Final   Performed at [facility], [place] [address] [place], [place] [address]    (this displays the last labs from the last 3 days)  No results found for: TOTALPROTELP, ALBUMINELP, A1GS,  A2GS, BETS, BETA2SER, GAMS, MSPIKE, SPEI (this displays SPEP labs)  No results found for: KPAFRELGTCHN, LAMBDASER, KAPLAMBRATIO (kappa/lambda light chains)  No results found for: HGBA, HGBA2QUANT, HGBFQUANT, HGBSQUAN (Hemoglobinopathy evaluation)   Lab Results  Component Value Date   LDH 155 11/23/2019    No results found for: IRON, TIBC,  IRONPCTSAT (Iron and TIBC)  No results found for: FERRITIN  Urinalysis    Component Value Date/Time   COLORURINE STRAW (A) 11/23/2018 2018   APPEARANCEUR CLEAR 11/23/2018 2018   LABSPEC 1.020 11/23/2018 2018   PHURINE 5.0 11/23/2018 2018   GLUCOSEU >=500 (A) 11/23/2018 2018   HGBUR NEGATIVE 11/23/2018 2018   [place] NEGATIVE 11/23/2018 2018   [place] 11/23/2018 2018   PROTEINUR NEGATIVE 11/23/2018 2018   NITRITE NEGATIVE 11/23/2018 2018   LEUKOCYTESUR NEGATIVE 11/23/2018 2018     STUDIES: No results found.   ELIGIBLE FOR AVAILABLE RESEARCH PROTOCOL: no   ASSESSMENT: 64 y.o. [place], [place] man with a diagnosis of chronic lymphoid leukemia made 11/04/2018  (a) the cells are positive for CD 04/12/19/22/23, kappa restricted  (1) Starting Rituximab weekly x 9 weeks beginning 11/23/2018  (a) reaction to first dose (which was 100 mg only).  (2) on maintenance rituximab, first dose 03/07/2019, repeated every 56 days   PLAN:     Total encounter time: *** minutes  Wilber Bihari, NP 02/07/20 3:10 PM Medical Oncology and Hematology [facility] [address], [place] [address] Tel. [phone]    Fax. [phone]  *Total Encounter Time as defined by the Centers for Medicare and Medicaid Services includes, in addition to the face-to-face time of a patient visit (documented in the note above) non-face-to-face time: obtaining and reviewing outside history, ordering and reviewing medications, tests or procedures, care coordination (communications with other health care professionals or caregivers) and documentation in the medical record.

## 2020-02-07 NOTE — Telephone Encounter (Signed)
This RN was notified today by pre [place] dept personnel that pt's treatment is not approved for continuation until PET scan obtained .  PET scan ordered 01/16/2020 at prior notification request but not scheduled.  This RN spoke with pt's caregiver- Manuella Ghazi ( his sister) per above.  Scheduled PET for 3/25 at 7 am - arrive at 630 am.  NPO 6 hours prior to scan.  Urgent reschedule request sent to scheduling per appointments tomorrow.

## 2020-02-07 NOTE — Telephone Encounter (Signed)
R/s appt per 3/16 sch message - pt sister aware of appt date and time

## 2020-02-08 ENCOUNTER — Inpatient Hospital Stay: Payer: Medicare PPO

## 2020-02-08 ENCOUNTER — Inpatient Hospital Stay: Payer: Medicare PPO | Admitting: Adult Health

## 2020-02-16 ENCOUNTER — Encounter (HOSPITAL_COMMUNITY)
Admission: RE | Admit: 2020-02-16 | Discharge: 2020-02-16 | Disposition: A | Discharge status: Home / Self Care | Payer: Medicare PPO | Source: Ambulatory Visit | Attending: Oncology | Admitting: Oncology

## 2020-02-16 ENCOUNTER — Ambulatory Visit (HOSPITAL_COMMUNITY): Payer: Medicare PPO

## 2020-02-16 ENCOUNTER — Other Ambulatory Visit: Payer: Self-pay

## 2020-02-16 DIAGNOSIS — Z79899 Other long term (current) drug therapy: Secondary | ICD-10-CM | POA: Insufficient documentation

## 2020-02-16 DIAGNOSIS — C8300 Small cell B-cell lymphoma, unspecified site: Secondary | ICD-10-CM | POA: Diagnosis present

## 2020-02-16 DIAGNOSIS — C9110 Chronic lymphocytic leukemia of B-cell type not having achieved remission: Secondary | ICD-10-CM | POA: Insufficient documentation

## 2020-02-16 LAB — GLUCOSE, CAPILLARY: Glucose-Capillary: 153 mg/dL — ABNORMAL HIGH (ref 70–99)

## 2020-02-16 MED ORDER — FLUDEOXYGLUCOSE F - 18 (FDG) INJECTION
11.1700 | Freq: Once | INTRAVENOUS | Status: AC | PRN
  Administered 2020-02-16: 11.17 via INTRAVENOUS

## 2020-02-16 NOTE — Progress Notes (Signed)
Pharmacist Chemotherapy Monitoring - Follow Up Assessment    I verify that I have reviewed each item in the below checklist:  . Regimen for the patient is scheduled for the appropriate day and plan matches scheduled date. Marland Kitchen Appropriate non-routine labs are ordered dependent on drug ordered. . If applicable, additional medications reviewed and ordered per protocol based on lifetime cumulative doses and/or treatment regimen.   Plan for follow-up and/or issues identified: No . I-vent associated with next due treatment: No . MD and/or nursing notified: No  Hosey Burmester D 02/16/2020 1:13 PM

## 2020-02-22 ENCOUNTER — Inpatient Hospital Stay: Payer: Medicare PPO | Attending: Adult Health

## 2020-02-22 ENCOUNTER — Inpatient Hospital Stay: Payer: Medicare PPO

## 2020-02-22 ENCOUNTER — Inpatient Hospital Stay: Payer: Medicare PPO | Admitting: Adult Health

## 2020-02-22 ENCOUNTER — Encounter: Payer: Self-pay | Admitting: Adult Health

## 2020-02-22 ENCOUNTER — Other Ambulatory Visit: Payer: Self-pay

## 2020-02-22 VITALS — BP 140/75 | HR 65 | Temp 97.5°F | Resp 18

## 2020-02-22 VITALS — BP 143/81 | HR 67 | Temp 98.3°F | Resp 20 | Ht 72.0 in | Wt 226.7 lb

## 2020-02-22 DIAGNOSIS — C9110 Chronic lymphocytic leukemia of B-cell type not having achieved remission: Secondary | ICD-10-CM | POA: Diagnosis present

## 2020-02-22 DIAGNOSIS — Z5112 Encounter for antineoplastic immunotherapy: Secondary | ICD-10-CM | POA: Diagnosis present

## 2020-02-22 LAB — CBC WITH DIFFERENTIAL/PLATELET
Abs Immature Granulocytes: 0.04 10*3/uL (ref 0.00–0.07)
Basophils Absolute: 0 10*3/uL (ref 0.0–0.1)
Basophils Relative: 0 %
Eosinophils Absolute: 0.2 10*3/uL (ref 0.0–0.5)
Eosinophils Relative: 2 %
HCT: 43.7 % (ref 39.0–52.0)
Hemoglobin: 13.7 g/dL (ref 13.0–17.0)
Immature Granulocytes: 1 %
Lymphocytes Relative: 27 %
Lymphs Abs: 2.1 10*3/uL (ref 0.7–4.0)
MCH: 27.2 pg (ref 26.0–34.0)
MCHC: 31.4 g/dL (ref 30.0–36.0)
MCV: 86.7 fL (ref 80.0–100.0)
Monocytes Absolute: 0.4 10*3/uL (ref 0.1–1.0)
Monocytes Relative: 5 %
Neutro Abs: 5.1 10*3/uL (ref 1.7–7.7)
Neutrophils Relative %: 65 %
Platelets: 114 10*3/uL — ABNORMAL LOW (ref 150–400)
RBC: 5.04 MIL/uL (ref 4.22–5.81)
RDW: 14.1 % (ref 11.5–15.5)
WBC: 7.8 10*3/uL (ref 4.0–10.5)
nRBC: 0 % (ref 0.0–0.2)

## 2020-02-22 LAB — COMPREHENSIVE METABOLIC PANEL
ALT: 42 U/L (ref 0–44)
AST: 29 U/L (ref 15–41)
Albumin: 4.5 g/dL (ref 3.5–5.0)
Alkaline Phosphatase: 101 U/L (ref 38–126)
Anion gap: 13 (ref 5–15)
BUN: 30 mg/dL — ABNORMAL HIGH (ref 8–23)
CO2: 26 mmol/L (ref 22–32)
Calcium: 9.8 mg/dL (ref 8.9–10.3)
Chloride: 101 mmol/L (ref 98–111)
Creatinine, Ser: 1.68 mg/dL — ABNORMAL HIGH (ref 0.61–1.24)
GFR calc Af Amer: 49 mL/min — ABNORMAL LOW (ref >60)
GFR calc non Af Amer: 42 mL/min — ABNORMAL LOW (ref >60)
Glucose, Bld: 143 mg/dL — ABNORMAL HIGH (ref 70–99)
Potassium: 4.2 mmol/L (ref 3.5–5.1)
Sodium: 140 mmol/L (ref 135–145)
Total Bilirubin: 1.4 mg/dL — ABNORMAL HIGH (ref 0.3–1.2)
Total Protein: 8.3 g/dL — ABNORMAL HIGH (ref 6.5–8.1)

## 2020-02-22 LAB — LACTATE DEHYDROGENASE: LDH: 179 U/L (ref 98–192)

## 2020-02-22 MED ORDER — SODIUM CHLORIDE 0.9 % IV SOLN
Freq: Once | INTRAVENOUS | Status: AC
  Filled 2020-02-22: qty 250

## 2020-02-22 MED ORDER — ACETAMINOPHEN 325 MG PO TABS
ORAL_TABLET | ORAL | Status: AC
  Filled 2020-02-22: qty 2

## 2020-02-22 MED ORDER — DIPHENHYDRAMINE HCL 25 MG PO CAPS
ORAL_CAPSULE | ORAL | Status: AC
  Filled 2020-02-22: qty 2

## 2020-02-22 MED ORDER — SODIUM CHLORIDE 0.9 % IV SOLN
375.0000 mg/m2 | Freq: Once | INTRAVENOUS | Status: AC
  Administered 2020-02-22: 900 mg via INTRAVENOUS
  Filled 2020-02-22: qty 50

## 2020-02-22 MED ORDER — ACETAMINOPHEN 325 MG PO TABS
650.0000 mg | ORAL_TABLET | Freq: Once | ORAL | Status: AC
  Administered 2020-02-22: 650 mg via ORAL

## 2020-02-22 MED ORDER — FAMOTIDINE IN NACL 20-0.9 MG/50ML-% IV SOLN
INTRAVENOUS | Status: AC
  Filled 2020-02-22: qty 50

## 2020-02-22 MED ORDER — DIPHENHYDRAMINE HCL 25 MG PO CAPS
25.0000 mg | ORAL_CAPSULE | Freq: Once | ORAL | Status: AC
  Administered 2020-02-22: 25 mg via ORAL

## 2020-02-22 MED ORDER — FAMOTIDINE IN NACL 20-0.9 MG/50ML-% IV SOLN
20.0000 mg | Freq: Once | INTRAVENOUS | Status: AC
  Administered 2020-02-22: 20 mg via INTRAVENOUS

## 2020-02-22 NOTE — Progress Notes (Signed)
[place]  [phone] [phone]     ID: Cody Cody [name] DOB: 1956-03-26  MR#: [id number]  [id number]  Patient Care Team: Cody Beard, MD as PCP - General (Family Medicine) Magrinat, Cody Dad, MD as Consulting Physician (Oncology) Cody Dock, NP OTHER MD:  CHIEF COMPLAINT: Chronic lymphoid leukemia  CURRENT TREATMENT: maintenance Rituximab   INTERVAL HISTORY: Cody Cody [name] returns today for follow-up and treatment of his chronic lymphoid leukemia. He is accompanied by his Cody [name].  He continues on maintenance Rituxumab every three months, with a dose today.   Since his last visit he underwent PET scan that showed expected low level uptake in some of his lymph nodes, and mild enlargement without uptake in others.  We did this as requested by insurance.     REVIEW OF SYSTEMS: Cody Cody [name] is doing well today.  He says he is feeling well.  No fatigue, night sweats.  He is seeing Dr. Berdine Cody [name] for his diabetes and HTN.  He denies any fever, chills, chest pain, palpitations, cough, shortness of breath, bowel/bladder changes, nausea or vomiting or any other concerns.  A detailed ROS was otherwise non contributory.     HISTORY OF CURRENT ILLNESS: From the original intake note:  Cody Cody [name] was evaluated by his primary care physician and found to have a high white cell count.  He was asked to go to the emergency room which the patient did on 11/04/2018 at 8 PM.  The patient was found to be stable, with a white cell count of greater than 150,000, most of which were lymphocytes.  A pathology smear review by Dr. Reuel [name]  showed lymphocytosis and smudge cells.  There were no blasts.  RBCs and platelets were unremarkable.  The patient was referred to our clinic for further evaluation  Cody Cody [name] subsequent history is as detailed below.   PAST MEDICAL HISTORY: Past Medical History:  Diagnosis Date  . Alcoholic cirrhosis ([place])   . Cataract   .  Diabetes mellitus without complication ([place])   . History of alcohol abuse   . Hypercholesterolemia   Hypertension, hypercholesterolemia, cataracts, seasonal allergies, cirrhosis, history of EtOH abuse   PAST SURGICAL HISTORY: No past surgical history on file. Surgery for incarcerated bowel release   FAMILY HISTORY Family History  Problem Relation Age of Onset  . Alcohol abuse Father   . Cirrhosis Father   . Coronary artery disease Father     The patient's father died from cirrhosis at age 72 in the setting of alcohol abuse; he also had significant coronary artery disease.  The patient's mother is living, 34 years old as of December 2019.  The patient has 2 sisters, no brothers.  They are not aware of any cancer history in the family   SOCIAL HISTORY:  The patient used to work in [employer] for the city of [place].  He was briefly married but the marriage has been annulled.  The patient currently lives with his Cody [name] Cody Cody [name] and their mother.  A second Cody [name], Cody Cody [name], lives in [place].   ADVANCED DIRECTIVES: The patient Cody [name] Cody Cody [name] is his healthcare power of attorney.  She can be reached at [phone].   HEALTH MAINTENANCE: Social History   Tobacco Use  . Smoking status: Never Smoker  . Smokeless tobacco: Never Used  Substance Use Topics  . Alcohol use: Not Currently  . Drug use: No    Colonoscopy:  PSA:  Bone density:   No Known Allergies  Current Outpatient  Medications  Medication Sig Dispense Refill  . allopurinol (ZYLOPRIM) 300 MG tablet TAKE 1 TABLET DAILY 90 tablet 3  . amLODipine (NORVASC) 10 MG tablet Take 10 mg by mouth daily.    Marland Kitchen atorvastatin (LIPITOR) 10 MG tablet Take 10 mg by mouth daily.    . Continuous Blood Gluc Receiver (FREESTYLE LIBRE 14 DAY READER) DEVI USE AS DIRECTED TO CHECK BLOOD SUGAR    . Continuous Blood Gluc Sensor (FREESTYLE LIBRE 14 DAY SENSOR) MISC USE AS DIRECTED TO CHECK BLOOD SUGAR    . FREESTYLE  LITE test strip     . hydrochlorothiazide (HYDRODIURIL) 25 MG tablet Take 25 mg by mouth daily.    Marland Kitchen linagliptin (TRADJENTA) 5 MG TABS tablet Take 5 mg by mouth daily.    . ondansetron (ZOFRAN) 4 MG tablet TAKE 1 TABLET BY MOUTH EVERY 8 HOURS AS NEEDED FOR NAUSEA AND VOMITING 8 tablet 2  . ONETOUCH DELICA LANCETS FINE MISC 3 (three) times daily.   5  . telmisartan-hydrochlorothiazide (MICARDIS HCT) 80-25 MG tablet Take 1 tablet by mouth daily.    Cody Cody [name] SOLOSTAR 300 UNIT/ML SOPN     . TRESIBA FLEXTOUCH 100 UNIT/ML FlexTouch Pen Inject 22 Units into the skin daily.    Marland Kitchen telmisartan (MICARDIS) 80 MG tablet Take 80 mg by mouth daily.     No current facility-administered medications for this visit.    OBJECTIVE: Middle-aged African-American man who appears stated age 64:   02/22/20 1150  BP: (!) 143/81  Pulse: 67  Resp: 20  Temp: 98.3 F (36.8 C)  SpO2: 99%     Body mass index is 30.75 kg/m.   Wt Readings from Last 3 Encounters:  02/22/20 226 lb 11.2 oz (102.8 kg)  11/23/19 226 lb (102.5 kg)  09/28/19 223 lb 8 oz (101.4 kg)      ECOG FS:0 - Asymptomatic GENERAL: Patient is a well appearing male in no acute distress HEENT:  Sclerae anicteric.  Mask in place. Neck is supple.  NODES:  No cervical, supraclavicular, or axillary lymphadenopathy palpated.  LUNGS:  Clear to auscultation bilaterally.  No wheezes or rhonchi. HEART:  Regular rate and rhythm. No murmur appreciated. ABDOMEN:  Soft, nontender.  Positive, normoactive bowel sounds. No organomegaly palpated. MSK:  No focal spinal tenderness to palpation. Full range of motion bilaterally in the upper extremities. EXTREMITIES:  No peripheral edema.   SKIN:  Clear with no obvious rashes or skin changes. No nail dyscrasia. NEURO:  Nonfocal. Well oriented.  Appropriate affect.     LAB RESULTS:  CMP     Component Value Date/Time   NA 140 02/22/2020 1121   K 4.2 02/22/2020 1121   CL 101 02/22/2020 1121   CO2 26  02/22/2020 1121   GLUCOSE 143 (H) 02/22/2020 1121   BUN 30 (H) 02/22/2020 1121   CREATININE 1.68 (H) 02/22/2020 1121   CALCIUM 9.8 02/22/2020 1121   PROT 8.3 (H) 02/22/2020 1121   ALBUMIN 4.5 02/22/2020 1121   AST 29 02/22/2020 1121   ALT 42 02/22/2020 1121   ALKPHOS 101 02/22/2020 1121   BILITOT 1.4 (H) 02/22/2020 1121   GFRNONAA 42 (L) 02/22/2020 1121   GFRAA 49 (L) 02/22/2020 1121    No results found for: TOTALPROTELP, ALBUMINELP, A1GS, A2GS, BETS, BETA2SER, GAMS, MSPIKE, SPEI  No results found for: KPAFRELGTCHN, LAMBDASER, KAPLAMBRATIO  Lab Results  Component Value Date   WBC 7.8 02/22/2020   NEUTROABS 5.1 02/22/2020   HGB 13.7 02/22/2020   HCT  43.7 02/22/2020   MCV 86.7 02/22/2020   PLT 114 (L) 02/22/2020      Chemistry      Component Value Date/Time   NA 140 02/22/2020 1121   K 4.2 02/22/2020 1121   CL 101 02/22/2020 1121   CO2 26 02/22/2020 1121   BUN 30 (H) 02/22/2020 1121   CREATININE 1.68 (H) 02/22/2020 1121      Component Value Date/Time   CALCIUM 9.8 02/22/2020 1121   ALKPHOS 101 02/22/2020 1121   AST 29 02/22/2020 1121   ALT 42 02/22/2020 1121   BILITOT 1.4 (H) 02/22/2020 1121       No results found for: LABCA2  No components found for: [id number]  No results for input(s): INR in the last 168 hours.  No results found for: LABCA2  No results found for: [id number]  No results found for: [id number]  No results found for: [id number]  No results found for: [id number]  No components found for: HGQUANT  No results found for: CEA1 / No results found for: CEA1   No results found for: AFPTUMOR  No results found for: CHROMOGRNA  No results found for: PSA1  Appointment on 02/22/2020  Component Date Value Ref Range Status  . WBC 02/22/2020 7.8  4.0 - 10.5 K/uL Final  . RBC 02/22/2020 5.04  4.22 - 5.81 MIL/uL Final  . Hemoglobin 02/22/2020 13.7  13.0 - 17.0 g/dL Final  . HCT 02/22/2020 43.7  39.0 - 52.0 % Final  . MCV 02/22/2020 86.7  80.0 - 100.0 fL  Final  . MCH 02/22/2020 27.2  26.0 - 34.0 pg Final  . MCHC 02/22/2020 31.4  30.0 - 36.0 g/dL Final  . RDW 02/22/2020 14.1  11.5 - 15.5 % Final  . Platelets 02/22/2020 114* 150 - 400 K/uL Final  . nRBC 02/22/2020 0.0  0.0 - 0.2 % Final  . Neutrophils Relative % 02/22/2020 65  % Final  . Neutro Abs 02/22/2020 5.1  1.7 - 7.7 K/uL Final  . Lymphocytes Relative 02/22/2020 27  % Final  . Lymphs Abs 02/22/2020 2.1  0.7 - 4.0 K/uL Final  . Monocytes Relative 02/22/2020 5  % Final  . Monocytes Absolute 02/22/2020 0.4  0.1 - 1.0 K/uL Final  . Eosinophils Relative 02/22/2020 2  % Final  . Eosinophils Absolute 02/22/2020 0.2  0.0 - 0.5 K/uL Final  . Basophils Relative 02/22/2020 0  % Final  . Basophils Absolute 02/22/2020 0.0  0.0 - 0.1 K/uL Final  . Immature Granulocytes 02/22/2020 1  % Final  . Abs Immature Granulocytes 02/22/2020 0.04  0.00 - 0.07 K/uL Final   Performed at [facility], [place] [address], [place], [place] [address]  . Sodium 02/22/2020 140  135 - 145 mmol/L Final  . Potassium 02/22/2020 4.2  3.5 - 5.1 mmol/L Final  . Chloride 02/22/2020 101  98 - 111 mmol/L Final  . CO2 02/22/2020 26  22 - 32 mmol/L Final  . Glucose, Bld 02/22/2020 143* 70 - 99 mg/dL Final   Glucose reference range applies only to samples taken after fasting for at least 8 hours.  . BUN 02/22/2020 30* 8 - 23 mg/dL Final  . Creatinine, Ser 02/22/2020 1.68* 0.61 - 1.24 mg/dL Final  . Calcium 02/22/2020 9.8  8.9 - 10.3 mg/dL Final  . Total Protein 02/22/2020 8.3* 6.5 - 8.1 g/dL Final  . Albumin 02/22/2020 4.5  3.5 - 5.0 g/dL Final  . AST 02/22/2020 29  15 - 41 U/L  Final  . ALT 02/22/2020 42  0 - 44 U/L Final  . Alkaline Phosphatase 02/22/2020 101  38 - 126 U/L Final  . Total Bilirubin 02/22/2020 1.4* 0.3 - 1.2 mg/dL Final  . GFR calc non Af Amer 02/22/2020 42* >60 mL/min Final  . GFR calc Af Amer 02/22/2020 49* >60 mL/min Final  . Anion gap 02/22/2020 13  5 - 15 Final   Performed at  [facility], [place] [address] [place], [place] [address]  . LDH 02/22/2020 179  98 - 192 U/L Final   Performed at [facility], [place] [address] [place], [place] [address]    (this displays the last labs from the last 3 days)  No results found for: TOTALPROTELP, ALBUMINELP, A1GS, A2GS, BETS, BETA2SER, GAMS, MSPIKE, SPEI (this displays SPEP labs)  No results found for: KPAFRELGTCHN, LAMBDASER, KAPLAMBRATIO (kappa/lambda light chains)  No results found for: HGBA, HGBA2QUANT, HGBFQUANT, HGBSQUAN (Hemoglobinopathy evaluation)   Lab Results  Component Value Date   LDH 179 02/22/2020    No results found for: IRON, TIBC, IRONPCTSAT (Iron and TIBC)  No results found for: FERRITIN  Urinalysis    Component Value Date/Time   COLORURINE STRAW (A) 11/23/2018 2018   APPEARANCEUR CLEAR 11/23/2018 2018   LABSPEC 1.020 11/23/2018 2018   PHURINE 5.0 11/23/2018 2018   GLUCOSEU >=500 (A) 11/23/2018 2018   [place] NEGATIVE 11/23/2018 2018   [place] NEGATIVE 11/23/2018 2018   [place] 11/23/2018 2018   PROTEINUR NEGATIVE 11/23/2018 2018   NITRITE NEGATIVE 11/23/2018 2018   LEUKOCYTESUR NEGATIVE 11/23/2018 2018     STUDIES: NM PET Image Initial (PI) Skull Base To Thigh  Result Date: 02/16/2020 CLINICAL DATA:  Initial treatment strategy for chronic lymphocytic leukemia. EXAM: NUCLEAR MEDICINE PET SKULL BASE TO THIGH TECHNIQUE: 11.2 mCi F-18 FDG was injected intravenously. Full-ring PET imaging was performed from the skull base to thigh after the radiotracer. CT data was obtained and used for attenuation correction and anatomic localization. Fasting blood glucose: 153 mg/dl COMPARISON:  None. FINDINGS: Mediastinal blood pool activity: SUV max 2.4 Liver activity: SUV max 3.5 NECK: Numerous small bilateral level 2 level 3 lymph nodes. Nodes have mild metabolic activity similar to background blood pool (SUV max equal 1.7). The RIGHT level  2 lymph node measuring 9 mm (image 47/4). Enlarged submental nodes measuring 1.3 cm on the LEFT and RIGHT without associated metabolic activity. Incidental CT findings: None none CHEST: There is mild activity in LEFT axial lymph nodes. For example 11 mm node in LEFT axilla with SUV max equal 1.7 = image 64/4. There is increased activity in the RIGHT axilla nodes which is favored to relate to COVID vaccination No significantly enlarged mediastinal lymph nodes on the there multiple small nodes present. None with activity above background. Focus of atelectasis in the medial RIGHT lower lobe associated with osteophytosis which is favored benign (image 95) Incidental CT findings: none ABDOMEN/PELVIS: Several small retroperitoneal lymph nodes without significant metabolic activity. Larger LEFT common iliac lymph node measures 13 mm short axis with SUV max equal 2.9. Enlarged LEFT external iliac lymph node measures 15 mm short axis with SUV max equal 3.1. spleen is normal size and normal metabolic activity. No abnormal activity in the liver. Incidental CT findings: none SKELETON: Incidental CT findings: none IMPRESSION: 1. Multiple mildly enlarged lymph nodes with mild to moderate metabolic activity consistent with low-grade lymphoma (chronic lymphocytic leukemia. 2. Mild activity in the submental and cervical lymph nodes with  metabolic activity less than blood pool ( [place] 2) 3. Bilateral hypermetabolic axillary lymph nodes also with activity less than blood pool. Increased activity in the RIGHT axilla is favored related to [place] vaccination. 4. More intense activity associated with LEFT external and internal iliac lymph nodes. The largest most intense nodes are LEFT external iliac nodes with metabolic activity similar to liver ( [place] 3). 5. Normal spleen and bone marrow. Electronically Signed   By: Suzy Bouchard M.D.   On: 02/16/2020 17:18     ELIGIBLE FOR AVAILABLE RESEARCH PROTOCOL: no   ASSESSMENT:  64 y.o. [place], [place] man with a diagnosis of chronic lymphoid leukemia made 11/04/2018  (a) the cells are positive for CD 04/12/19/22/23, kappa restricted  (1) Starting Rituximab weekly x 9 weeks beginning 11/23/2018  (a) reaction to first dose (which was 100 mg only).  (2) on maintenance rituximab, first dose 03/07/2019, repeated every 3 months  (a) Baseline PET scan completed on 02/16/2020 per his insurance   PLAN:  Cody Cody [name] is doing well.  His labs remain stable and his WBC and lymphocyte count are normal.  I reviewed his PET scan results with him, and noted that the lymph nodes, and activity that was noted is normal considering his CLL.  He will continue on Rituximab every 3 months.  He has no b symptoms.    I reviewed with Cody Cody [name] what CLL is, and gave her an overview of the Rituxmab and how it works.  I reviewed Cody initial labs at the time of his diagnosis, when his WBC were 150s, and now, when his WBCs are 7.    Cody Cody [name] will continue on his current treatment plan.  We will see him back in 3 months for his next treatment.  His family knows to call for any questions or concerns prior to his next visit with [place].    Total encounter time: 20 minutes  Wilber Bihari, NP 02/22/20 12:32 PM Medical Oncology and Hematology [facility] [place], [place] [address] Tel. [phone]    Fax. [phone]  *Total Encounter Time as defined by the Centers for Medicare and Medicaid Services includes, in addition to the face-to-face time of a patient visit (documented in the note above) non-face-to-face time: obtaining and reviewing outside history, ordering and reviewing medications, tests or procedures, care coordination (communications with other health care professionals or caregivers) and documentation in the medical record.

## 2020-02-22 NOTE — Patient Instructions (Signed)
[facility] Discharge Instructions for Patients Receiving Chemotherapy  Today you received the following chemotherapy agents: rituximab.  To help prevent nausea and vomiting after your treatment, we encourage you to take your nausea medication as directed.   If you develop nausea and vomiting that is not controlled by your nausea medication, call the clinic.   BELOW ARE SYMPTOMS THAT SHOULD BE REPORTED IMMEDIATELY:  *FEVER GREATER THAN 100.5 F  *CHILLS WITH OR WITHOUT FEVER  NAUSEA AND VOMITING THAT IS NOT CONTROLLED WITH YOUR NAUSEA MEDICATION  *UNUSUAL SHORTNESS OF BREATH  *UNUSUAL BRUISING OR BLEEDING  TENDERNESS IN MOUTH AND THROAT WITH OR WITHOUT PRESENCE OF ULCERS  *URINARY PROBLEMS  *BOWEL PROBLEMS  UNUSUAL RASH Items with * indicate a potential emergency and should be followed up as soon as possible.  Feel free to call the clinic should you have any questions or concerns. The clinic phone number is [phone].  Please show the CHEMO ALERT CARD at check-in to the Emergency Department and triage nurse.   

## 2020-02-23 ENCOUNTER — Telehealth: Payer: Self-pay | Admitting: Adult Health

## 2020-02-23 LAB — BETA 2 MICROGLOBULIN, SERUM: Beta-2 Microglobulin: 3.1 mg/L — ABNORMAL HIGH (ref 0.6–2.4)

## 2020-02-23 NOTE — Telephone Encounter (Signed)
No 3/31 LOS. No changes made to pt's schedule.

## 2020-03-14 ENCOUNTER — Ambulatory Visit: Payer: 59

## 2020-03-14 ENCOUNTER — Ambulatory Visit: Payer: 59 | Admitting: Oncology

## 2020-03-14 ENCOUNTER — Other Ambulatory Visit: Payer: 59

## 2020-03-23 DIAGNOSIS — E1165 Type 2 diabetes mellitus with hyperglycemia: Secondary | ICD-10-CM | POA: Diagnosis not present

## 2020-03-23 DIAGNOSIS — E1122 Type 2 diabetes mellitus with diabetic chronic kidney disease: Secondary | ICD-10-CM | POA: Diagnosis not present

## 2020-03-23 DIAGNOSIS — E785 Hyperlipidemia, unspecified: Secondary | ICD-10-CM | POA: Diagnosis not present

## 2020-03-23 DIAGNOSIS — I129 Hypertensive chronic kidney disease with stage 1 through stage 4 chronic kidney disease, or unspecified chronic kidney disease: Secondary | ICD-10-CM | POA: Diagnosis not present

## 2020-03-23 DIAGNOSIS — C9110 Chronic lymphocytic leukemia of B-cell type not having achieved remission: Secondary | ICD-10-CM | POA: Diagnosis not present

## 2020-03-23 DIAGNOSIS — N183 Chronic kidney disease, stage 3 unspecified: Secondary | ICD-10-CM | POA: Diagnosis not present

## 2020-04-03 DIAGNOSIS — E1169 Type 2 diabetes mellitus with other specified complication: Secondary | ICD-10-CM | POA: Diagnosis not present

## 2020-04-03 DIAGNOSIS — I10 Essential (primary) hypertension: Secondary | ICD-10-CM | POA: Diagnosis not present

## 2020-04-03 DIAGNOSIS — N183 Chronic kidney disease, stage 3 unspecified: Secondary | ICD-10-CM | POA: Diagnosis not present

## 2020-04-04 ENCOUNTER — Encounter: Payer: Self-pay | Admitting: Podiatry

## 2020-04-04 ENCOUNTER — Other Ambulatory Visit: Payer: Self-pay

## 2020-04-04 ENCOUNTER — Ambulatory Visit: Payer: Medicare PPO | Admitting: Podiatry

## 2020-04-04 VITALS — Temp 97.0°F

## 2020-04-04 DIAGNOSIS — B351 Tinea unguium: Secondary | ICD-10-CM

## 2020-04-04 DIAGNOSIS — E1151 Type 2 diabetes mellitus with diabetic peripheral angiopathy without gangrene: Secondary | ICD-10-CM | POA: Diagnosis not present

## 2020-04-04 NOTE — Progress Notes (Signed)
This patient presents to the office with chief complaint of long thick painful nails.  Patient says the nails are painful walking and wearing shoes.  This patient is unable to self treat.  This patient is unable to trim her nails since she is unable to reach her nails.  She presents to the office for preventative foot care services.  Patient is brought to the office by his sister.  General Appearance  Alert, conversant and in no acute stress.  Vascular  Dorsalis pedis and posterior tibial  pulses are palpable  bilaterally.  Capillary return is within normal limits  bilaterally. Temperature is within normal limits  bilaterally.  Neurologic  Senn-Weinstein monofilament wire test within normal limits  bilaterally. Muscle power within normal limits bilaterally.  Nails Thick disfigured discolored nails with subungual debris  from hallux to fifth toes bilaterally. No evidence of bacterial infection or drainage bilaterally. Marked thick nail spicule medial border right hallux.  Orthopedic  No limitations of motion  feet .  No crepitus or effusions noted.  No bony pathology or digital deformities noted.  Skin  normotropic skin with no porokeratosis noted bilaterally.  No signs of infections or ulcers noted.     Onychomycosis  Nails  B/L.  Pain in right toes  Pain in left toes  Debridement of nails both feet followed trimming the nails with dremel tool.   Cauterized site of nail spicule.    RTC 3 months.   Gardiner Barefoot DPM

## 2020-04-10 ENCOUNTER — Encounter: Payer: Self-pay | Admitting: Oncology

## 2020-04-10 NOTE — Progress Notes (Signed)
Spoke to pt's sister, Ms. Matlock about applying for more copay assistance since pt has [employer].  She gave me consent to apply in pt's behalf so I completed the online application w/ [employer].  The application status is currently pending review so I will notify her of the outcome once I receive it.

## 2020-04-12 ENCOUNTER — Encounter: Payer: Self-pay | Admitting: Oncology

## 2020-04-12 NOTE — Progress Notes (Signed)
Pt is approved w/ LLS to receive assistance up to $8,000 toassist w/ins premiums andqualifying treatment expenseseffective5/1/21to4/30/22.

## 2020-04-23 DIAGNOSIS — N183 Chronic kidney disease, stage 3 unspecified: Secondary | ICD-10-CM | POA: Diagnosis not present

## 2020-04-23 DIAGNOSIS — I129 Hypertensive chronic kidney disease with stage 1 through stage 4 chronic kidney disease, or unspecified chronic kidney disease: Secondary | ICD-10-CM | POA: Diagnosis not present

## 2020-04-23 DIAGNOSIS — C9110 Chronic lymphocytic leukemia of B-cell type not having achieved remission: Secondary | ICD-10-CM | POA: Diagnosis not present

## 2020-04-23 DIAGNOSIS — E1122 Type 2 diabetes mellitus with diabetic chronic kidney disease: Secondary | ICD-10-CM | POA: Diagnosis not present

## 2020-04-27 NOTE — Progress Notes (Signed)
Pharmacist Chemotherapy Monitoring - Follow Up Assessment    I verify that I have reviewed each item in the below checklist:  . Regimen for the patient is scheduled for the appropriate day and plan matches scheduled date. Marland Kitchen Appropriate non-routine labs are ordered dependent on drug ordered. . If applicable, additional medications reviewed and ordered per protocol based on lifetime cumulative doses and/or treatment regimen.   Plan for follow-up and/or issues identified: No . I-vent associated with next due treatment: No . MD and/or nursing notified: No  Callan Yontz K 04/27/2020 10:31 AM

## 2020-05-02 NOTE — Progress Notes (Signed)
[place]  [phone] [phone]     ID: Cody [name] DOB: September 19, 1956  MR#: [id number]  XAJ#:[id number]  Patient Care Team: Cody Beard, MD as PCP - General (Family Medicine) Cody [name], Cody Dad, MD as Consulting Physician (Oncology) Cody Cruel, MD OTHER MD:  CHIEF COMPLAINT: Chronic lymphoid leukemia  CURRENT TREATMENT: maintenance Rituximab   INTERVAL HISTORY: Cody [name] returns today for follow-up and treatment of his chronic lymphoid leukemia. He is accompanied by his sister.  He continues on maintenance Rituxumab with a dose today.  He tolerates this well, with no side effects that he is aware of.  In addition he has had no "B" symptoms since the last visit.  In particular he is very active in gardening, has a good energy level, has gained a little weight, has had no rash, pruritus, adenopathy, drenching sweats, or unexplained fever.   REVIEW OF SYSTEMS: Cody [name] tells me his sugars are not well controlled currently because his doctor is changing the medications.  He is walking and doing some yard work for exercise.  Detailed review of systems was otherwise noncontributory   HISTORY OF CURRENT ILLNESS: From the original intake note:  Cody [name] was evaluated by his primary care physician and found to have a high white cell count.  He was asked to go to the emergency room which the patient did on 11/04/2018 at 8 PM.  The patient was found to be stable, with a white cell count of greater than 150,000, most of which were lymphocytes.  A pathology smear review by Dr. Reuel [name]  showed lymphocytosis and smudge cells.  There were no blasts.  RBCs and platelets were unremarkable.  The patient was referred to our clinic for further evaluation  Cody [name] subsequent history is as detailed below.   PAST MEDICAL HISTORY: Past Medical History:  Diagnosis Date  . Alcoholic cirrhosis ([place])   . Cataract   . Diabetes mellitus without  complication ([place])   . History of alcohol abuse   . Hypercholesterolemia   Hypertension, hypercholesterolemia, cataracts, seasonal allergies, cirrhosis, history of EtOH abuse   PAST SURGICAL HISTORY: No past surgical history on file. Surgery for incarcerated bowel release   FAMILY HISTORY Family History  Problem Relation Age of Onset  . Alcohol abuse Father   . Cirrhosis Father   . Coronary artery disease Father     The patient's father died from cirrhosis at age 70 in the setting of alcohol abuse; he also had significant coronary artery disease.  The patient's mother is living, 77 years old as of December 2019.  The patient has 2 sisters, no brothers.  They are not aware of any cancer history in the family   SOCIAL HISTORY:  The patient used to work in [employer] for the city of [place].  He was briefly married but the marriage has been annulled.  The patient currently lives with his sister Cody [name] and their mother.  A second sister, Cody [name], lives in [place].   ADVANCED DIRECTIVES: The patient Sister Cody [name] is his healthcare power of attorney.  She can be reached at [phone].   HEALTH MAINTENANCE: Social History   Tobacco Use  . Smoking status: Never Smoker  . Smokeless tobacco: Never Used  Substance Use Topics  . Alcohol use: Not Currently  . Drug use: No    Colonoscopy:  PSA:  Bone density:   No Known Allergies  Current Outpatient Medications  Medication Sig Dispense Refill  . allopurinol (  ZYLOPRIM) 300 MG tablet TAKE 1 TABLET DAILY 90 tablet 3  . amLODipine (NORVASC) 10 MG tablet Take 10 mg by mouth daily.    Marland Kitchen atorvastatin (LIPITOR) 10 MG tablet Take 10 mg by mouth daily.    . Continuous Blood Gluc Receiver (FREESTYLE LIBRE 14 DAY READER) DEVI USE AS DIRECTED TO CHECK BLOOD SUGAR    . Continuous Blood Gluc Sensor (FREESTYLE LIBRE 14 DAY SENSOR) MISC USE AS DIRECTED TO CHECK BLOOD SUGAR    . fexofenadine (ALLEGRA) 180 MG tablet  Take 180 mg by mouth at bedtime.    Marland Kitchen FREESTYLE LITE test strip     . hydrochlorothiazide (HYDRODIURIL) 25 MG tablet Take 25 mg by mouth daily.    Marland Kitchen linagliptin (TRADJENTA) 5 MG TABS tablet Take 5 mg by mouth daily.    . ondansetron (ZOFRAN) 4 MG tablet TAKE 1 TABLET BY MOUTH EVERY 8 HOURS AS NEEDED FOR NAUSEA AND VOMITING 8 tablet 2  . ONETOUCH DELICA LANCETS FINE MISC 3 (three) times daily.   5  . telmisartan (MICARDIS) 80 MG tablet Take 80 mg by mouth daily.    [name] SOLOSTAR 300 UNIT/ML SOPN 25 Units. daily     No current facility-administered medications for this visit.    OBJECTIVE:  African-American man in no acute distress Vitals:   05/03/20 1016  BP: 134/78  Pulse: 70  Resp: 20  Temp: 98.3 F (36.8 C)  SpO2: 100%     Body mass index is 31.19 kg/m.   Wt Readings from Last 3 Encounters:  05/03/20 230 lb (104.3 kg)  02/22/20 226 lb 11.2 oz (102.8 kg)  11/23/19 226 lb (102.5 kg)      ECOG FS:0 - Asymptomatic  Sclerae unicteric, EOMs intact Wearing a mask No cervical or supraclavicular adenopathy, no axillary or inguinal adenopathy. Lungs no rales or rhonchi Heart regular rate and rhythm Abd soft, nontender, positive bowel sounds MSK no focal spinal tenderness, no upper extremity lymphedema Neuro: nonfocal, well oriented, appropriate affect  LAB RESULTS:  CMP     Component Value Date/Time   NA 135 05/03/2020 0940   K 4.2 05/03/2020 0940   CL 99 05/03/2020 0940   CO2 25 05/03/2020 0940   GLUCOSE 177 (H) 05/03/2020 0940   BUN 28 (H) 05/03/2020 0940   CREATININE 1.61 (H) 05/03/2020 0940   CALCIUM 10.0 05/03/2020 0940   PROT 8.2 (H) 05/03/2020 0940   ALBUMIN 4.3 05/03/2020 0940   AST 24 05/03/2020 0940   ALT 30 05/03/2020 0940   ALKPHOS 102 05/03/2020 0940   BILITOT 0.7 05/03/2020 0940   GFRNONAA 44 (L) 05/03/2020 0940   GFRAA 51 (L) 05/03/2020 0940    No results found for: TOTALPROTELP, ALBUMINELP, A1GS, A2GS, BETS, BETA2SER, GAMS, MSPIKE, SPEI  No  results found for: KPAFRELGTCHN, LAMBDASER, KAPLAMBRATIO  Lab Results  Component Value Date   WBC 7.3 05/03/2020   NEUTROABS 5.2 05/03/2020   HGB 14.3 05/03/2020   HCT 44.8 05/03/2020   MCV 86.0 05/03/2020   PLT 120 (L) 05/03/2020      Chemistry      Component Value Date/Time   NA 135 05/03/2020 0940   K 4.2 05/03/2020 0940   CL 99 05/03/2020 0940   CO2 25 05/03/2020 0940   BUN 28 (H) 05/03/2020 0940   CREATININE 1.61 (H) 05/03/2020 0940      Component Value Date/Time   CALCIUM 10.0 05/03/2020 0940   ALKPHOS 102 05/03/2020 0940   AST 24 05/03/2020 0940  ALT 30 05/03/2020 0940   BILITOT 0.7 05/03/2020 0940       No results found for: LABCA2  No components found for: [id number]  No results for input(s): INR in the last 168 hours.  No results found for: LABCA2  No results found for: DDU202  No results found for: RKY706  No results found for: CBJ628  No results found for: CA2729  No components found for: HGQUANT  No results found for: CEA1 / No results found for: CEA1   No results found for: AFPTUMOR  No results found for: [place]  No results found for: HGBA, HGBA2QUANT, HGBFQUANT, HGBSQUAN (Hemoglobinopathy evaluation)   Lab Results  Component Value Date   LDH 151 05/03/2020    No results found for: IRON, TIBC, IRONPCTSAT (Iron and TIBC)  No results found for: FERRITIN  Urinalysis    Component Value Date/Time   COLORURINE STRAW (A) 11/23/2018 2018   APPEARANCEUR CLEAR 11/23/2018 2018   LABSPEC 1.020 11/23/2018 2018   [place] 5.0 11/23/2018 2018   GLUCOSEU >=500 (A) 11/23/2018 2018   [place] NEGATIVE 11/23/2018 2018   [place] NEGATIVE 11/23/2018 2018   [place] 11/23/2018 2018   PROTEINUR NEGATIVE 11/23/2018 2018   NITRITE NEGATIVE 11/23/2018 2018   LEUKOCYTESUR NEGATIVE 11/23/2018 2018    STUDIES: No results found.   ELIGIBLE FOR AVAILABLE RESEARCH PROTOCOL: no   ASSESSMENT: 64 y.o. [place] man with a  diagnosis of chronic lymphoid leukemia made 11/04/2018  (a) the cells are positive for CD 04/12/19/22/23, kappa restricted  (1) Starting Rituximab weekly x 9 weeks beginning 11/23/2018  (a) reaction to first dose (which was 100 mg only).  (2) on maintenance rituximab, first dose 03/07/2019, repeated every 3 months  (a) baseline PET scan obtained on 02/16/2020 was [place] 2 except for the left iliac nodes, with SUV max 3.1   PLAN: Cody [name] is now 1-1/2 years out from his initial diagnosis of chronic lymphoid leukemia.  He is clinically with no symptoms and his labs have been very stable, with mild thrombocytopenia being the only remaining abnormality of concern.  We have been prolonging the time between rituximab treatments.  He will receive the next one in October, 4 months from now, and then he will receive another 1 in April, 6 months after that.  If everything continues stable we will consider starting observation at that point  I encouraged him to be as active as possible and to make sure to get his diabetes well controlled  Even though he has had his Covid vaccines he may not have been able to make a good response because of the chronic leukemia and it would be safest for him to pretend that he has not had the vaccines.  They know to call for any other issue that may develop before the next visit  Total encounter time 30 minutes.Sarajane Jews C. Kimon Loewen, MD 05/03/20 4:40 PM Medical Oncology and Hematology [facility] [place], [place] [address] Tel. [phone]    Fax. [phone]   I, Wilburn Mylar, am acting as scribe for Dr. Virgie [name]. Keyontay Stolz.  I, Lurline Del MD, have reviewed the above documentation for accuracy and completeness, and I agree with the above.    *Total Encounter Time as defined by the Centers for Medicare and Medicaid Services includes, in addition to the face-to-face time of a patient visit (documented in the note  above) non-face-to-face time: obtaining and reviewing outside history, ordering and reviewing medications,  tests or procedures, care coordination (communications with other health care professionals or caregivers) and documentation in the medical record.

## 2020-05-03 ENCOUNTER — Inpatient Hospital Stay: Payer: Medicare PPO | Attending: Oncology

## 2020-05-03 ENCOUNTER — Other Ambulatory Visit: Payer: Self-pay

## 2020-05-03 ENCOUNTER — Telehealth: Payer: Self-pay | Admitting: Oncology

## 2020-05-03 ENCOUNTER — Inpatient Hospital Stay (HOSPITAL_BASED_OUTPATIENT_CLINIC_OR_DEPARTMENT_OTHER): Payer: Medicare PPO | Admitting: Oncology

## 2020-05-03 ENCOUNTER — Encounter: Payer: Self-pay | Admitting: Oncology

## 2020-05-03 ENCOUNTER — Inpatient Hospital Stay: Payer: Medicare PPO

## 2020-05-03 VITALS — BP 134/82 | HR 69 | Temp 97.5°F | Resp 18

## 2020-05-03 VITALS — BP 134/78 | HR 70 | Temp 98.3°F | Resp 20 | Ht 72.0 in | Wt 230.0 lb

## 2020-05-03 DIAGNOSIS — Z5112 Encounter for antineoplastic immunotherapy: Secondary | ICD-10-CM | POA: Diagnosis not present

## 2020-05-03 DIAGNOSIS — C9110 Chronic lymphocytic leukemia of B-cell type not having achieved remission: Secondary | ICD-10-CM

## 2020-05-03 LAB — COMPREHENSIVE METABOLIC PANEL
ALT: 30 U/L (ref 0–44)
AST: 24 U/L (ref 15–41)
Albumin: 4.3 g/dL (ref 3.5–5.0)
Alkaline Phosphatase: 102 U/L (ref 38–126)
Anion gap: 11 (ref 5–15)
BUN: 28 mg/dL — ABNORMAL HIGH (ref 8–23)
CO2: 25 mmol/L (ref 22–32)
Calcium: 10 mg/dL (ref 8.9–10.3)
Chloride: 99 mmol/L (ref 98–111)
Creatinine, Ser: 1.61 mg/dL — ABNORMAL HIGH (ref 0.61–1.24)
GFR calc Af Amer: 51 mL/min — ABNORMAL LOW (ref >60)
GFR calc non Af Amer: 44 mL/min — ABNORMAL LOW (ref >60)
Glucose, Bld: 177 mg/dL — ABNORMAL HIGH (ref 70–99)
Potassium: 4.2 mmol/L (ref 3.5–5.1)
Sodium: 135 mmol/L (ref 135–145)
Total Bilirubin: 0.7 mg/dL (ref 0.3–1.2)
Total Protein: 8.2 g/dL — ABNORMAL HIGH (ref 6.5–8.1)

## 2020-05-03 LAB — CBC WITH DIFFERENTIAL/PLATELET
Abs Immature Granulocytes: 0.06 10*3/uL (ref 0.00–0.07)
Basophils Absolute: 0 10*3/uL (ref 0.0–0.1)
Basophils Relative: 0 %
Eosinophils Absolute: 0.2 10*3/uL (ref 0.0–0.5)
Eosinophils Relative: 3 %
HCT: 44.8 % (ref 39.0–52.0)
Hemoglobin: 14.3 g/dL (ref 13.0–17.0)
Immature Granulocytes: 1 %
Lymphocytes Relative: 20 %
Lymphs Abs: 1.4 10*3/uL (ref 0.7–4.0)
MCH: 27.4 pg (ref 26.0–34.0)
MCHC: 31.9 g/dL (ref 30.0–36.0)
MCV: 86 fL (ref 80.0–100.0)
Monocytes Absolute: 0.4 10*3/uL (ref 0.1–1.0)
Monocytes Relative: 5 %
Neutro Abs: 5.2 10*3/uL (ref 1.7–7.7)
Neutrophils Relative %: 71 %
Platelets: 120 10*3/uL — ABNORMAL LOW (ref 150–400)
RBC: 5.21 MIL/uL (ref 4.22–5.81)
RDW: 13.4 % (ref 11.5–15.5)
WBC: 7.3 10*3/uL (ref 4.0–10.5)
nRBC: 0 % (ref 0.0–0.2)

## 2020-05-03 LAB — LACTATE DEHYDROGENASE: LDH: 151 U/L (ref 98–192)

## 2020-05-03 MED ORDER — FAMOTIDINE IN NACL 20-0.9 MG/50ML-% IV SOLN
20.0000 mg | Freq: Once | INTRAVENOUS | Status: AC
  Administered 2020-05-03: 20 mg via INTRAVENOUS

## 2020-05-03 MED ORDER — DIPHENHYDRAMINE HCL 25 MG PO CAPS
25.0000 mg | ORAL_CAPSULE | Freq: Once | ORAL | Status: AC
  Administered 2020-05-03: 25 mg via ORAL

## 2020-05-03 MED ORDER — SODIUM CHLORIDE 0.9 % IV SOLN
Freq: Once | INTRAVENOUS | Status: AC
  Filled 2020-05-03: qty 250

## 2020-05-03 MED ORDER — ACETAMINOPHEN 325 MG PO TABS
650.0000 mg | ORAL_TABLET | Freq: Once | ORAL | Status: AC
  Administered 2020-05-03: 650 mg via ORAL

## 2020-05-03 MED ORDER — DIPHENHYDRAMINE HCL 25 MG PO CAPS
ORAL_CAPSULE | ORAL | Status: AC
  Filled 2020-05-03: qty 1

## 2020-05-03 MED ORDER — FAMOTIDINE IN NACL 20-0.9 MG/50ML-% IV SOLN
INTRAVENOUS | Status: AC
  Filled 2020-05-03: qty 50

## 2020-05-03 MED ORDER — SODIUM CHLORIDE 0.9 % IV SOLN
375.0000 mg/m2 | Freq: Once | INTRAVENOUS | Status: AC
  Administered 2020-05-03: 900 mg via INTRAVENOUS
  Filled 2020-05-03: qty 50

## 2020-05-03 MED ORDER — ACETAMINOPHEN 325 MG PO TABS
ORAL_TABLET | ORAL | Status: AC
  Filled 2020-05-03: qty 2

## 2020-05-03 NOTE — Telephone Encounter (Signed)
Scheduled appts per 6/10 los. Pt declined AVS and stated they would refer to mychart.

## 2020-05-03 NOTE — Patient Instructions (Signed)
[facility] Discharge Instructions for Patients Receiving Chemotherapy  Today you received the following chemotherapy agents: rituximab.  To help prevent nausea and vomiting after your treatment, we encourage you to take your nausea medication as directed.   If you develop nausea and vomiting that is not controlled by your nausea medication, call the clinic.   BELOW ARE SYMPTOMS THAT SHOULD BE REPORTED IMMEDIATELY:  *FEVER GREATER THAN 100.5 F  *CHILLS WITH OR WITHOUT FEVER  NAUSEA AND VOMITING THAT IS NOT CONTROLLED WITH YOUR NAUSEA MEDICATION  *UNUSUAL SHORTNESS OF BREATH  *UNUSUAL BRUISING OR BLEEDING  TENDERNESS IN MOUTH AND THROAT WITH OR WITHOUT PRESENCE OF ULCERS  *URINARY PROBLEMS  *BOWEL PROBLEMS  UNUSUAL RASH Items with * indicate a potential emergency and should be followed up as soon as possible.  Feel free to call the clinic should you have any questions or concerns. The clinic phone number is [phone].  Please show the CHEMO ALERT CARD at check-in to the Emergency Department and triage nurse.   

## 2020-05-04 LAB — BETA 2 MICROGLOBULIN, SERUM: Beta-2 Microglobulin: 3.3 mg/L — ABNORMAL HIGH (ref 0.6–2.4)

## 2020-07-03 DIAGNOSIS — I10 Essential (primary) hypertension: Secondary | ICD-10-CM | POA: Diagnosis not present

## 2020-07-03 DIAGNOSIS — N183 Chronic kidney disease, stage 3 unspecified: Secondary | ICD-10-CM | POA: Diagnosis not present

## 2020-07-03 DIAGNOSIS — E1169 Type 2 diabetes mellitus with other specified complication: Secondary | ICD-10-CM | POA: Diagnosis not present

## 2020-07-06 ENCOUNTER — Other Ambulatory Visit: Payer: Self-pay

## 2020-07-06 ENCOUNTER — Encounter: Payer: Self-pay | Admitting: Podiatry

## 2020-07-06 ENCOUNTER — Ambulatory Visit: Payer: Medicare PPO | Admitting: Podiatry

## 2020-07-06 DIAGNOSIS — B351 Tinea unguium: Secondary | ICD-10-CM

## 2020-07-06 DIAGNOSIS — E1151 Type 2 diabetes mellitus with diabetic peripheral angiopathy without gangrene: Secondary | ICD-10-CM

## 2020-07-06 NOTE — Progress Notes (Signed)
This patient presents to the office with chief complaint of long thick painful nails.  Patient says the nails are painful walking and wearing shoes.  This patient is unable to self treat.  This patient is unable to trim his nails since he is unable to reach her nails.  he presents to the office for preventative foot care services.  Patient is brought to the office by his sister.  General Appearance  Alert, conversant and in no acute stress.  Vascular  Dorsalis pedis and posterior tibial  pulses are palpable  bilaterally.  Capillary return is within normal limits  bilaterally. Temperature is within normal limits  bilaterally.  Neurologic  Senn-Weinstein monofilament wire test within normal limits  bilaterally. Muscle power within normal limits bilaterally.  Nails Thick disfigured discolored nails with subungual debris  from hallux to fifth toes bilaterally. No evidence of bacterial infection or drainage bilaterally.   Orthopedic  No limitations of motion  feet .  No crepitus or effusions noted.  No bony pathology or digital deformities noted.  Skin  normotropic skin with no porokeratosis noted bilaterally.  No signs of infections or ulcers noted.     Onychomycosis  Nails  B/L.  Pain in right toes  Pain in left toes  Debridement of nails both feet followed trimming the nails with dremel tool.      RTC  3  months.   Valia Wingard DPM  

## 2020-07-11 DIAGNOSIS — E119 Type 2 diabetes mellitus without complications: Secondary | ICD-10-CM | POA: Diagnosis not present

## 2020-07-24 DIAGNOSIS — N183 Chronic kidney disease, stage 3 unspecified: Secondary | ICD-10-CM | POA: Diagnosis not present

## 2020-07-24 DIAGNOSIS — I129 Hypertensive chronic kidney disease with stage 1 through stage 4 chronic kidney disease, or unspecified chronic kidney disease: Secondary | ICD-10-CM | POA: Diagnosis not present

## 2020-07-24 DIAGNOSIS — E1122 Type 2 diabetes mellitus with diabetic chronic kidney disease: Secondary | ICD-10-CM | POA: Diagnosis not present

## 2020-07-24 DIAGNOSIS — C9110 Chronic lymphocytic leukemia of B-cell type not having achieved remission: Secondary | ICD-10-CM | POA: Diagnosis not present

## 2020-08-15 DIAGNOSIS — C9191 Lymphoid leukemia, unspecified, in remission: Secondary | ICD-10-CM | POA: Diagnosis not present

## 2020-08-15 DIAGNOSIS — Z794 Long term (current) use of insulin: Secondary | ICD-10-CM | POA: Diagnosis not present

## 2020-08-15 DIAGNOSIS — Z7189 Other specified counseling: Secondary | ICD-10-CM | POA: Diagnosis not present

## 2020-08-15 DIAGNOSIS — E785 Hyperlipidemia, unspecified: Secondary | ICD-10-CM | POA: Diagnosis not present

## 2020-08-15 DIAGNOSIS — E1169 Type 2 diabetes mellitus with other specified complication: Secondary | ICD-10-CM | POA: Diagnosis not present

## 2020-08-15 DIAGNOSIS — Z0000 Encounter for general adult medical examination without abnormal findings: Secondary | ICD-10-CM | POA: Diagnosis not present

## 2020-08-15 DIAGNOSIS — N183 Chronic kidney disease, stage 3 unspecified: Secondary | ICD-10-CM | POA: Diagnosis not present

## 2020-08-15 DIAGNOSIS — I10 Essential (primary) hypertension: Secondary | ICD-10-CM | POA: Diagnosis not present

## 2020-08-23 DIAGNOSIS — I129 Hypertensive chronic kidney disease with stage 1 through stage 4 chronic kidney disease, or unspecified chronic kidney disease: Secondary | ICD-10-CM | POA: Diagnosis not present

## 2020-08-23 DIAGNOSIS — C9110 Chronic lymphocytic leukemia of B-cell type not having achieved remission: Secondary | ICD-10-CM | POA: Diagnosis not present

## 2020-08-23 DIAGNOSIS — N183 Chronic kidney disease, stage 3 unspecified: Secondary | ICD-10-CM | POA: Diagnosis not present

## 2020-08-23 DIAGNOSIS — E1122 Type 2 diabetes mellitus with diabetic chronic kidney disease: Secondary | ICD-10-CM | POA: Diagnosis not present

## 2020-08-28 DIAGNOSIS — I129 Hypertensive chronic kidney disease with stage 1 through stage 4 chronic kidney disease, or unspecified chronic kidney disease: Secondary | ICD-10-CM | POA: Diagnosis not present

## 2020-08-28 DIAGNOSIS — F70 Mild intellectual disabilities: Secondary | ICD-10-CM | POA: Diagnosis not present

## 2020-08-28 DIAGNOSIS — Z794 Long term (current) use of insulin: Secondary | ICD-10-CM | POA: Diagnosis not present

## 2020-08-28 DIAGNOSIS — E119 Type 2 diabetes mellitus without complications: Secondary | ICD-10-CM | POA: Diagnosis not present

## 2020-09-20 ENCOUNTER — Inpatient Hospital Stay: Payer: Medicare PPO

## 2020-09-20 ENCOUNTER — Inpatient Hospital Stay: Payer: Medicare PPO | Admitting: Medical

## 2020-09-20 ENCOUNTER — Inpatient Hospital Stay: Payer: Medicare PPO | Attending: Medical

## 2020-09-20 ENCOUNTER — Other Ambulatory Visit: Payer: Self-pay

## 2020-09-20 VITALS — BP 150/80 | HR 72 | Temp 97.5°F | Resp 20

## 2020-09-20 VITALS — BP 138/77 | HR 72 | Temp 97.5°F | Resp 18 | Ht 72.0 in | Wt 233.1 lb

## 2020-09-20 DIAGNOSIS — C9110 Chronic lymphocytic leukemia of B-cell type not having achieved remission: Secondary | ICD-10-CM

## 2020-09-20 DIAGNOSIS — M791 Myalgia, unspecified site: Secondary | ICD-10-CM | POA: Diagnosis not present

## 2020-09-20 DIAGNOSIS — Z5112 Encounter for antineoplastic immunotherapy: Secondary | ICD-10-CM | POA: Diagnosis not present

## 2020-09-20 LAB — CBC WITH DIFFERENTIAL/PLATELET
Abs Immature Granulocytes: 0.04 10*3/uL (ref 0.00–0.07)
Basophils Absolute: 0 10*3/uL (ref 0.0–0.1)
Basophils Relative: 0 %
Eosinophils Absolute: 0.3 10*3/uL (ref 0.0–0.5)
Eosinophils Relative: 3 %
HCT: 41.2 % (ref 39.0–52.0)
Hemoglobin: 12.9 g/dL — ABNORMAL LOW (ref 13.0–17.0)
Immature Granulocytes: 1 %
Lymphocytes Relative: 33 %
Lymphs Abs: 2.6 10*3/uL (ref 0.7–4.0)
MCH: 27.2 pg (ref 26.0–34.0)
MCHC: 31.3 g/dL (ref 30.0–36.0)
MCV: 86.9 fL (ref 80.0–100.0)
Monocytes Absolute: 0.4 10*3/uL (ref 0.1–1.0)
Monocytes Relative: 5 %
Neutro Abs: 4.5 10*3/uL (ref 1.7–7.7)
Neutrophils Relative %: 58 %
Platelets: 97 10*3/uL — ABNORMAL LOW (ref 150–400)
RBC: 4.74 MIL/uL (ref 4.22–5.81)
RDW: 14.4 % (ref 11.5–15.5)
WBC: 7.8 10*3/uL (ref 4.0–10.5)
nRBC: 0 % (ref 0.0–0.2)

## 2020-09-20 LAB — COMPREHENSIVE METABOLIC PANEL
ALT: 21 U/L (ref 0–44)
AST: 18 U/L (ref 15–41)
Albumin: 4.1 g/dL (ref 3.5–5.0)
Alkaline Phosphatase: 85 U/L (ref 38–126)
Anion gap: 6 (ref 5–15)
BUN: 26 mg/dL — ABNORMAL HIGH (ref 8–23)
CO2: 30 mmol/L (ref 22–32)
Calcium: 9.2 mg/dL (ref 8.9–10.3)
Chloride: 103 mmol/L (ref 98–111)
Creatinine, Ser: 1.63 mg/dL — ABNORMAL HIGH (ref 0.61–1.24)
GFR, Estimated: 46 mL/min — ABNORMAL LOW (ref >60)
Glucose, Bld: 146 mg/dL — ABNORMAL HIGH (ref 70–99)
Potassium: 4.3 mmol/L (ref 3.5–5.1)
Sodium: 139 mmol/L (ref 135–145)
Total Bilirubin: 0.7 mg/dL (ref 0.3–1.2)
Total Protein: 7.5 g/dL (ref 6.5–8.1)

## 2020-09-20 LAB — LACTATE DEHYDROGENASE: LDH: 156 U/L (ref 98–192)

## 2020-09-20 MED ORDER — ACETAMINOPHEN 325 MG PO TABS
650.0000 mg | ORAL_TABLET | Freq: Once | ORAL | Status: AC
  Administered 2020-09-20: 650 mg via ORAL

## 2020-09-20 MED ORDER — DIPHENHYDRAMINE HCL 25 MG PO CAPS
ORAL_CAPSULE | ORAL | Status: AC
  Filled 2020-09-20: qty 1

## 2020-09-20 MED ORDER — SODIUM CHLORIDE 0.9 % IV SOLN
Freq: Once | INTRAVENOUS | Status: AC
  Filled 2020-09-20: qty 250

## 2020-09-20 MED ORDER — DIPHENHYDRAMINE HCL 25 MG PO CAPS
25.0000 mg | ORAL_CAPSULE | Freq: Once | ORAL | Status: AC
  Administered 2020-09-20: 25 mg via ORAL

## 2020-09-20 MED ORDER — FAMOTIDINE IN NACL 20-0.9 MG/50ML-% IV SOLN
20.0000 mg | Freq: Once | INTRAVENOUS | Status: AC
  Administered 2020-09-20: 20 mg via INTRAVENOUS

## 2020-09-20 MED ORDER — ACETAMINOPHEN 325 MG PO TABS
ORAL_TABLET | ORAL | Status: AC
  Filled 2020-09-20: qty 2

## 2020-09-20 MED ORDER — SODIUM CHLORIDE 0.9 % IV SOLN
375.0000 mg/m2 | Freq: Once | INTRAVENOUS | Status: AC
  Administered 2020-09-20: 900 mg via INTRAVENOUS
  Filled 2020-09-20: qty 50

## 2020-09-20 MED ORDER — FAMOTIDINE IN NACL 20-0.9 MG/50ML-% IV SOLN
INTRAVENOUS | Status: AC
  Filled 2020-09-20: qty 50

## 2020-09-20 MED ORDER — ACETAMINOPHEN 325 MG PO TABS: 650.0000 mg | ORAL_TABLET | Freq: Four times a day (QID) | ORAL | Status: DC | PRN

## 2020-09-20 NOTE — Progress Notes (Signed)
Per Dr. Jana Hakim, ok for treatment today with platelets at 97 and creatine at 1.63

## 2020-09-20 NOTE — Patient Instructions (Signed)
[facility] Discharge Instructions for Patients Receiving Chemotherapy  Today you received the following chemotherapy agents: Rituximab  To help prevent nausea and vomiting after your treatment, we encourage you to take your nausea medication as directed by your MD.   If you develop nausea and vomiting that is not controlled by your nausea medication, call the clinic.   BELOW ARE SYMPTOMS THAT SHOULD BE REPORTED IMMEDIATELY:  *FEVER GREATER THAN 100.5 F  *CHILLS WITH OR WITHOUT FEVER  NAUSEA AND VOMITING THAT IS NOT CONTROLLED WITH YOUR NAUSEA MEDICATION  *UNUSUAL SHORTNESS OF BREATH  *UNUSUAL BRUISING OR BLEEDING  TENDERNESS IN MOUTH AND THROAT WITH OR WITHOUT PRESENCE OF ULCERS  *URINARY PROBLEMS  *BOWEL PROBLEMS  UNUSUAL RASH Items with * indicate a potential emergency and should be followed up as soon as possible.  Feel free to call the clinic should you have any questions or concerns. The clinic phone number is [phone].  Please show the [place] at check-in to the Emergency Department and triage nurse.

## 2020-09-20 NOTE — Patient Instructions (Signed)

## 2020-09-21 LAB — BETA 2 MICROGLOBULIN, SERUM: Beta-2 Microglobulin: 3.4 mg/L — ABNORMAL HIGH (ref 0.6–2.4)

## 2020-09-22 DIAGNOSIS — I129 Hypertensive chronic kidney disease with stage 1 through stage 4 chronic kidney disease, or unspecified chronic kidney disease: Secondary | ICD-10-CM | POA: Diagnosis not present

## 2020-09-22 DIAGNOSIS — N183 Chronic kidney disease, stage 3 unspecified: Secondary | ICD-10-CM | POA: Diagnosis not present

## 2020-09-22 DIAGNOSIS — C9110 Chronic lymphocytic leukemia of B-cell type not having achieved remission: Secondary | ICD-10-CM | POA: Diagnosis not present

## 2020-09-22 DIAGNOSIS — E1122 Type 2 diabetes mellitus with diabetic chronic kidney disease: Secondary | ICD-10-CM | POA: Diagnosis not present

## 2020-09-24 NOTE — Progress Notes (Signed)
Symptoms Management Clinic Progress Note   Cody [name] [id number] [birth year] 64 y.o.  Trudee Grip is managed by Dr. Lurline Del  Actively treated with chemotherapy/immunotherapy/hormonal therapy: yes  Current therapy: Rituxan  Last treated: 09/20/2020 (cycle 17, day 1)  Next scheduled appointment with provider: 03/07/2021  Assessment: Plan:    Myalgia - Plan: acetaminophen (TYLENOL) tablet 650 mg  Chronic lymphoid leukemia ([place])   Myalgias secondary to COVID-19 booster: The patient was given Tylenol 650 mg p.o. x1.  Chronic lymphocytic leukemia: The patient presents to the clinic today for cycle 17, day 1 of Rituxan.  He is scheduled to be seen in follow-up on 03/07/2021.  Please see After Visit Summary for patient specific instructions.  Future Appointments  Date Time Provider [place]  10/17/2020  1:30 PM Gardiner Barefoot, DPM [facility]  03/07/2021 11:00 AM [facility] LAB [facility] None  03/07/2021 11:30 AM Magrinat, Virgie Dad, MD [facility] None  03/07/2021 12:15 PM [facility] INFUSION [facility] None    No orders of the defined types were placed in this encounter.      Subjective:   Patient ID:  Cody [name] is a 64 y.o. (DOB [birth year]) male.  Chief Complaint:  Chief Complaint  Patient presents with  . Follow-up    HPI Cody [name] is a 64 y.o. male with a diagnosis of chronic lymphocytic leukemia.  He is managed by Dr. Jana Hakim and presents to the clinic today for cycle 17, day 1 of Rituxan.  He presents with his sister today.  He reports that he has pain in his left arm after receiving his Covid booster.  He is asking for Tylenol today.  He is otherwise doing well with no acute issues of concern.  He denies fevers, chills, sweats, headaches, nausea, vomiting, constipation, or diarrhea.  His appetite is good.  Medications: I have reviewed the patient's current medications.  Allergies: No Known  Allergies  Past Medical History:  Diagnosis Date  . Alcoholic cirrhosis ([place])   . Cataract   . Diabetes mellitus without complication ([place])   . History of alcohol abuse   . Hypercholesterolemia     No past surgical history on file.  Family History  Problem Relation Age of Onset  . Alcohol abuse Father   . Cirrhosis Father   . Coronary artery disease Father     Social History   Socioeconomic History  . Marital status: Divorced    Spouse name: Not on file  . Number of children: Not on file  . Years of education: Not on file  . Highest education level: Not on file  Occupational History  . Not on file  Tobacco Use  . Smoking status: Never Smoker  . Smokeless tobacco: Never Used  Substance and Sexual Activity  . Alcohol use: Not Currently  . Drug use: No  . Sexual activity: Not on file  Other Topics Concern  . Not on file  Social History Narrative  . Not on file   Social Determinants of Health   Financial Resource Strain:   . Difficulty of Paying Living Expenses: Not on file  Food Insecurity:   . Worried About Charity fundraiser in the Last Year: Not on file  . Ran Out of Food in the Last Year: Not on file  Transportation Needs:   . Lack of Transportation (Medical): Not on file  . Lack of Transportation (Non-Medical): Not on file  Physical Activity:   . Days of Exercise per Week:  Not on file  . Minutes of Exercise per Session: Not on file  Stress:   . Feeling of Stress : Not on file  Social Connections:   . Frequency of Communication with Friends and Family: Not on file  . Frequency of Social Gatherings with Friends and Family: Not on file  . Attends Religious Services: Not on file  . Active Member of Clubs or Organizations: Not on file  . Attends Archivist Meetings: Not on file  . Marital Status: Not on file  Intimate Partner Violence:   . Fear of Current or Ex-Partner: Not on file  . Emotionally Abused: Not on file  . Physically Abused: Not  on file  . Sexually Abused: Not on file    Past Medical History, Surgical history, Social history, and Family history were reviewed and updated as appropriate.   Please see review of systems for further details on the patient's review from today.   Review of Systems:  Review of Systems  Constitutional: Negative for chills, diaphoresis and fever.  HENT: Negative for trouble swallowing and voice change.   Respiratory: Negative for cough, chest tightness, shortness of breath and wheezing.   Cardiovascular: Negative for chest pain and palpitations.  Gastrointestinal: Negative for abdominal pain, constipation, diarrhea, nausea and vomiting.  Musculoskeletal: Positive for myalgias. Negative for back pain.  Skin: Negative for rash.  Neurological: Negative for dizziness, weakness, light-headedness and headaches.    Objective:   Physical Exam:  BP 138/77 (BP Location: Left Arm, Patient Position: Sitting)   Pulse 72   Temp (!) 97.5 F (36.4 C) (Tympanic)   Resp 18   Ht 6' (1.829 m)   Wt 233 lb 1.6 oz (105.7 kg)   SpO2 100%   BMI 31.61 kg/m  ECOG: 1  Physical Exam Constitutional:      General: He is not in acute distress.    Appearance: He is not diaphoretic.  HENT:     Head: Normocephalic and atraumatic.     Mouth/Throat:     Mouth: Mucous membranes are moist.     Pharynx: Oropharynx is clear. No oropharyngeal exudate or posterior oropharyngeal erythema.  Eyes:     General: No scleral icterus.       Right eye: No discharge.        Left eye: No discharge.     Conjunctiva/sclera: Conjunctivae normal.  Cardiovascular:     Rate and Rhythm: Normal rate and regular rhythm.     Heart sounds: Normal heart sounds. No murmur heard.  No friction rub. No gallop.   Pulmonary:     Effort: Pulmonary effort is normal. No respiratory distress.     Breath sounds: Normal breath sounds. No wheezing or rales.  Abdominal:     General: Bowel sounds are normal. There is no distension.      Tenderness: There is no abdominal tenderness. There is no guarding or rebound.  Musculoskeletal:     Cervical back: Normal range of motion and neck supple. No tenderness.  Lymphadenopathy:     Cervical: No cervical adenopathy.  Skin:    General: Skin is warm and dry.     Findings: No erythema or rash.  Neurological:     Mental Status: He is alert.     Coordination: Coordination normal.     Gait: Gait normal.     Lab Review:     Component Value Date/Time   NA 139 09/20/2020 1115   K 4.3 09/20/2020 1115   CL 103  09/20/2020 1115   CO2 30 09/20/2020 1115   GLUCOSE 146 (H) 09/20/2020 1115   BUN 26 (H) 09/20/2020 1115   CREATININE 1.63 (H) 09/20/2020 1115   CALCIUM 9.2 09/20/2020 1115   PROT 7.5 09/20/2020 1115   ALBUMIN 4.1 09/20/2020 1115   AST 18 09/20/2020 1115   ALT 21 09/20/2020 1115   ALKPHOS 85 09/20/2020 1115   BILITOT 0.7 09/20/2020 1115   GFRNONAA 46 (L) 09/20/2020 1115   GFRAA 51 (L) 05/03/2020 0940       Component Value Date/Time   WBC 7.8 09/20/2020 1115   RBC 4.74 09/20/2020 1115   HGB 12.9 (L) 09/20/2020 1115   HGB 11.8 (L) 11/05/2018 1515   HCT 41.2 09/20/2020 1115   PLT 97 (L) 09/20/2020 1115   PLT 146 (L) 11/05/2018 1515   MCV 86.9 09/20/2020 1115   MCH 27.2 09/20/2020 1115   MCHC 31.3 09/20/2020 1115   RDW 14.4 09/20/2020 1115   LYMPHSABS 2.6 09/20/2020 1115   MONOABS 0.4 09/20/2020 1115   EOSABS 0.3 09/20/2020 1115   BASOSABS 0.0 09/20/2020 1115   -------------------------------  Imaging from last 24 hours (if applicable):  Radiology interpretation: No results found.

## 2020-10-03 ENCOUNTER — Encounter: Payer: Self-pay | Admitting: *Deleted

## 2020-10-03 ENCOUNTER — Encounter: Payer: Self-pay | Admitting: Oncology

## 2020-10-04 ENCOUNTER — Encounter: Payer: Self-pay | Admitting: Licensed Clinical Social Worker

## 2020-10-04 ENCOUNTER — Encounter: Payer: Self-pay | Admitting: General Practice

## 2020-10-04 NOTE — Progress Notes (Signed)
[facility] Spiritual Care Note  Scheduled caregiver support phone call with Charma Igo, Billy's sister and guardian, at [place] tomorrow per referral from [facility]/RN.   [place], [place], [facility] Pager [phone] Voicemail [phone]

## 2020-10-04 NOTE — Progress Notes (Signed)
[place] Work  Clinical Social Work was referred by Therapist, sports for assessment of needs after receiving MyChart message from pt's sister/ guardian, Charma Igo.  Clinical Social Worker contacted caregiver by phone.  Ms. Junie Spencer unable to speak at this time. She will be speaking with chaplain, Lattie Haw, tomorrow at 1pm. Agreed to have this CSW coordinate with Lattie Haw to determine potential resources in the community and appropriate supports for this patient and family. CSW also provided direct contact information.     [place], [place] Worker [employer]

## 2020-10-05 ENCOUNTER — Encounter: Payer: Self-pay | Admitting: General Practice

## 2020-10-05 NOTE — Progress Notes (Signed)
[place] Spiritual Care Note  Spoke with sisters Charma Igo (guardian) and Majel Homer by phone as scheduled. Built rapport, became more acquainted with their caregiving context (each sister lives independently and takes turns caring for [place] and their 65yo mother, who live together in a third house), provided caregiver support, and helped them identify their top concerns: 1) Billy often doesn't communicate discomfort, and 2) they are concerned about hand tremors and gait/pace change re family history of Parkinson's disease. Encouraged them to write down these questions to ask Billy's PCP at his upcoming appointment. Also continue to consult with Sharyn Lull Zavala/LCSW re referral suggestions; she plans to follow up with [name] by phone next week as they have planned.  [place], [place], [facility] Pager [phone] Voicemail [phone]

## 2020-10-09 ENCOUNTER — Telehealth: Payer: Self-pay | Admitting: Licensed Clinical Social Worker

## 2020-10-09 NOTE — Telephone Encounter (Signed)
[place] CSW Progress Note  Clinical Social Worker attempted to contact caregiver, Charma Igo, by phone to follow-up after conversation last week.  No answer. CSW left VM with information on the [place] who may be able to assist family with more specific IDD resources if needed. Encouraged caregiver to call the [facility] or this CSW if she has any questions.    Christeen Douglas , LCSW

## 2020-10-12 ENCOUNTER — Ambulatory Visit: Payer: Medicare PPO | Admitting: Podiatry

## 2020-10-17 ENCOUNTER — Encounter: Payer: Self-pay | Admitting: Podiatry

## 2020-10-17 ENCOUNTER — Ambulatory Visit: Payer: Medicare PPO | Admitting: Podiatry

## 2020-10-17 ENCOUNTER — Other Ambulatory Visit: Payer: Self-pay

## 2020-10-17 DIAGNOSIS — M79675 Pain in left toe(s): Secondary | ICD-10-CM

## 2020-10-17 DIAGNOSIS — M79674 Pain in right toe(s): Secondary | ICD-10-CM

## 2020-10-17 DIAGNOSIS — B351 Tinea unguium: Secondary | ICD-10-CM | POA: Diagnosis not present

## 2020-10-17 NOTE — Progress Notes (Signed)
This patient presents to the office with chief complaint of long thick painful nails.  Patient says the nails are painful walking and wearing shoes.  This patient is unable to self treat.  This patient is unable to trim his nails since he is unable to reach her nails.  he presents to the office for preventative foot care services.  Patient is brought to the office by his sister.  General Appearance  Alert, conversant and in no acute stress.  Vascular  Dorsalis pedis and posterior tibial  pulses are palpable  bilaterally.  Capillary return is within normal limits  bilaterally. Temperature is within normal limits  bilaterally.  Neurologic  Senn-Weinstein monofilament wire test within normal limits  bilaterally. Muscle power within normal limits bilaterally.  Nails Thick disfigured discolored nails with subungual debris  from hallux to fifth toes bilaterally. No evidence of bacterial infection or drainage bilaterally.   Orthopedic  No limitations of motion  feet .  No crepitus or effusions noted.  No bony pathology or digital deformities noted.  Skin  normotropic skin with no porokeratosis noted bilaterally.  No signs of infections or ulcers noted.     Onychomycosis  Nails  B/L.  Pain in right toes  Pain in left toes  Debridement of nails both feet followed trimming the nails with dremel tool.      RTC  6  months.   Gardiner Barefoot DPM

## 2020-10-23 DIAGNOSIS — C9110 Chronic lymphocytic leukemia of B-cell type not having achieved remission: Secondary | ICD-10-CM | POA: Diagnosis not present

## 2020-10-23 DIAGNOSIS — I129 Hypertensive chronic kidney disease with stage 1 through stage 4 chronic kidney disease, or unspecified chronic kidney disease: Secondary | ICD-10-CM | POA: Diagnosis not present

## 2020-10-23 DIAGNOSIS — N183 Chronic kidney disease, stage 3 unspecified: Secondary | ICD-10-CM | POA: Diagnosis not present

## 2020-10-23 DIAGNOSIS — E1122 Type 2 diabetes mellitus with diabetic chronic kidney disease: Secondary | ICD-10-CM | POA: Diagnosis not present

## 2020-11-23 DIAGNOSIS — I129 Hypertensive chronic kidney disease with stage 1 through stage 4 chronic kidney disease, or unspecified chronic kidney disease: Secondary | ICD-10-CM | POA: Diagnosis not present

## 2020-11-23 DIAGNOSIS — C9110 Chronic lymphocytic leukemia of B-cell type not having achieved remission: Secondary | ICD-10-CM | POA: Diagnosis not present

## 2020-11-23 DIAGNOSIS — E1122 Type 2 diabetes mellitus with diabetic chronic kidney disease: Secondary | ICD-10-CM | POA: Diagnosis not present

## 2020-11-23 DIAGNOSIS — N183 Chronic kidney disease, stage 3 unspecified: Secondary | ICD-10-CM | POA: Diagnosis not present

## 2020-11-28 DIAGNOSIS — Z7189 Other specified counseling: Secondary | ICD-10-CM | POA: Diagnosis not present

## 2020-11-28 DIAGNOSIS — I10 Essential (primary) hypertension: Secondary | ICD-10-CM | POA: Diagnosis not present

## 2020-11-28 DIAGNOSIS — C9191 Lymphoid leukemia, unspecified, in remission: Secondary | ICD-10-CM | POA: Diagnosis not present

## 2020-11-28 DIAGNOSIS — E119 Type 2 diabetes mellitus without complications: Secondary | ICD-10-CM | POA: Diagnosis not present

## 2020-11-28 DIAGNOSIS — F70 Mild intellectual disabilities: Secondary | ICD-10-CM | POA: Diagnosis not present

## 2020-11-28 DIAGNOSIS — I129 Hypertensive chronic kidney disease with stage 1 through stage 4 chronic kidney disease, or unspecified chronic kidney disease: Secondary | ICD-10-CM | POA: Diagnosis not present

## 2020-12-22 DIAGNOSIS — I129 Hypertensive chronic kidney disease with stage 1 through stage 4 chronic kidney disease, or unspecified chronic kidney disease: Secondary | ICD-10-CM | POA: Diagnosis not present

## 2020-12-22 DIAGNOSIS — N183 Chronic kidney disease, stage 3 unspecified: Secondary | ICD-10-CM | POA: Diagnosis not present

## 2020-12-22 DIAGNOSIS — C9110 Chronic lymphocytic leukemia of B-cell type not having achieved remission: Secondary | ICD-10-CM | POA: Diagnosis not present

## 2020-12-22 DIAGNOSIS — E1122 Type 2 diabetes mellitus with diabetic chronic kidney disease: Secondary | ICD-10-CM | POA: Diagnosis not present

## 2021-01-21 DIAGNOSIS — E1122 Type 2 diabetes mellitus with diabetic chronic kidney disease: Secondary | ICD-10-CM | POA: Diagnosis not present

## 2021-01-21 DIAGNOSIS — I129 Hypertensive chronic kidney disease with stage 1 through stage 4 chronic kidney disease, or unspecified chronic kidney disease: Secondary | ICD-10-CM | POA: Diagnosis not present

## 2021-01-23 ENCOUNTER — Encounter: Payer: Self-pay | Admitting: Podiatry

## 2021-01-23 ENCOUNTER — Other Ambulatory Visit: Payer: Self-pay

## 2021-01-23 ENCOUNTER — Ambulatory Visit: Payer: Medicare PPO | Admitting: Podiatry

## 2021-01-23 DIAGNOSIS — E1151 Type 2 diabetes mellitus with diabetic peripheral angiopathy without gangrene: Secondary | ICD-10-CM | POA: Diagnosis not present

## 2021-01-23 DIAGNOSIS — B351 Tinea unguium: Secondary | ICD-10-CM

## 2021-01-23 DIAGNOSIS — M79674 Pain in right toe(s): Secondary | ICD-10-CM | POA: Diagnosis not present

## 2021-01-23 DIAGNOSIS — M79675 Pain in left toe(s): Secondary | ICD-10-CM | POA: Diagnosis not present

## 2021-01-23 NOTE — Progress Notes (Signed)
This patient presents to the office with chief complaint of long thick painful nails.  Patient says the nails are painful walking and wearing shoes.  This patient is unable to self treat.  This patient is unable to trim his nails since he is unable to reach her nails.  he presents to the office for preventative foot care services.  Patient is brought to the office by his sister.  General Appearance  Alert, conversant and in no acute stress.  Vascular  Dorsalis pedis and posterior tibial  pulses are palpable  bilaterally.  Capillary return is within normal limits  bilaterally. Temperature is within normal limits  bilaterally.  Neurologic  Senn-Weinstein monofilament wire test within normal limits  bilaterally. Muscle power within normal limits bilaterally.  Nails Thick disfigured discolored nails with subungual debris  from hallux to fifth toes bilaterally. No evidence of bacterial infection or drainage bilaterally.   Orthopedic  No limitations of motion  feet .  No crepitus or effusions noted.  No bony pathology or digital deformities noted.  Skin  normotropic skin with no porokeratosis noted bilaterally.  No signs of infections or ulcers noted.     Onychomycosis  Nails  B/L.  Pain in right toes  Pain in left toes  Debridement of nails both feet followed trimming the nails with dremel tool.      RTC  3  months.   Gardiner Barefoot DPM

## 2021-02-21 DIAGNOSIS — E1122 Type 2 diabetes mellitus with diabetic chronic kidney disease: Secondary | ICD-10-CM | POA: Diagnosis not present

## 2021-02-21 DIAGNOSIS — N183 Chronic kidney disease, stage 3 unspecified: Secondary | ICD-10-CM | POA: Diagnosis not present

## 2021-02-21 DIAGNOSIS — I129 Hypertensive chronic kidney disease with stage 1 through stage 4 chronic kidney disease, or unspecified chronic kidney disease: Secondary | ICD-10-CM | POA: Diagnosis not present

## 2021-03-06 NOTE — Progress Notes (Signed)
[name]  [phone] [phone]     ID: Cody [name] DOB: 1956-01-26  MR#: [id number]  HEN#:[id number]  Patient Care Team: [name], MD as PCP - General (Family Medicine) Texanna Hilburn, Cody Dad, MD as Consulting Physician (Oncology) Chauncey Cruel, MD OTHER MD:  CHIEF COMPLAINT: Chronic lymphoid leukemia  CURRENT TREATMENT: maintenance Rituximab   INTERVAL HISTORY: Cody [name] returns today for follow-up and treatment of his chronic lymphoid leukemia. He is accompanied by his sister Cody [name]  He continues on maintenance Rituxumab with a dose today.  He tolerates this well, with no side effects that he is aware of.  He does not like to the needles of course and it is getting a little difficult to find a good vein his sister says.  We discussed port placement today.  REVIEW OF SYSTEMS: Cody [name] gets on his treadmill 3 times a day which is terrific.  He does a lot of gardening and he says he put up about 10 bags of leaves recently.  He does not walk outside.  He just got a very becoming haircut from his sister.  Aside from these issues he has had no fever, no drenching sweats, no obvious adenopathy, and no unexplained weight loss or fatigue.   COVID 19 VACCINATION STATUS: x2   HISTORY OF CURRENT ILLNESS: From the original intake note:  Cody [name] was evaluated by his primary care physician and found to have a high white cell count.  He was asked to go to the emergency room which the patient did on 11/04/2018 at 8 PM.  The patient was found to be stable, with a white cell count of greater than 150,000, most of which were lymphocytes.  A pathology smear review by Dr. Reuel [name]  showed lymphocytosis and smudge cells.  There were no blasts.  RBCs and platelets were unremarkable.  The patient was referred to our clinic for further evaluation  Cody [name] subsequent history is as detailed below.   PAST MEDICAL HISTORY: Past Medical History:   Diagnosis Date  . Alcoholic cirrhosis ([place])   . Cataract   . Diabetes mellitus without complication ([place])   . History of alcohol abuse   . Hypercholesterolemia   Hypertension, hypercholesterolemia, cataracts, seasonal allergies, cirrhosis, history of EtOH abuse   PAST SURGICAL HISTORY: No past surgical history on file. Surgery for incarcerated bowel release   FAMILY HISTORY Family History  Problem Relation Age of Onset  . Alcohol abuse Father   . Cirrhosis Father   . Coronary artery disease Father    The patient's father died from cirrhosis at age 26 in the setting of alcohol abuse; he also had significant coronary artery disease.  The patient's mother is living, 14 years old as of December 2019.  The patient has 2 sisters, no brothers.  They are not aware of any cancer history in the family   SOCIAL HISTORY:  The patient used to work in [employer] for the city of [place].  He was briefly married but the marriage has been annulled.  The patient currently lives with his sister Cody [name] and their mother.  A second sister, Cody [name], lives in [place].   ADVANCED DIRECTIVES: The patient Sister Cody [name] is his healthcare power of attorney.  She can be reached at [phone].   HEALTH MAINTENANCE: Social History   Tobacco Use  . Smoking status: Never Smoker  . Smokeless tobacco: Never Used  Substance Use Topics  . Alcohol use: Not Currently  .  Drug use: No    Colonoscopy:  PSA:  Bone density:   No Known Allergies  Current Outpatient Medications  Medication Sig Dispense Refill  . allopurinol (ZYLOPRIM) 300 MG tablet TAKE 1 TABLET DAILY 90 tablet 3  . amLODipine (NORVASC) 10 MG tablet Take 10 mg by mouth daily.    Marland Kitchen atorvastatin (LIPITOR) 10 MG tablet Take 10 mg by mouth daily.    . B-D UF III MINI PEN NEEDLES 31G X 5 MM MISC     . Blood Glucose Monitoring Suppl (ACCU-CHEK GUIDE) w/Device KIT     . Continuous Blood Gluc Receiver (FREESTYLE LIBRE  14 DAY READER) DEVI USE AS DIRECTED TO CHECK BLOOD SUGAR    . Continuous Blood Gluc Sensor (FREESTYLE LIBRE 14 DAY SENSOR) MISC USE AS DIRECTED TO CHECK BLOOD SUGAR    . fexofenadine (ALLEGRA) 180 MG tablet Take 180 mg by mouth at bedtime.    Marland Kitchen FREESTYLE LITE test strip     . hydrochlorothiazide (HYDRODIURIL) 25 MG tablet Take 25 mg by mouth daily.    Marland Kitchen linagliptin (TRADJENTA) 5 MG TABS tablet Take 5 mg by mouth daily.    . ondansetron (ZOFRAN) 4 MG tablet TAKE 1 TABLET BY MOUTH EVERY 8 HOURS AS NEEDED FOR NAUSEA AND VOMITING 8 tablet 2  . ONETOUCH DELICA LANCETS FINE MISC 3 (three) times daily.   5  . telmisartan (MICARDIS) 80 MG tablet Take 80 mg by mouth daily.    [name] SOLOSTAR 300 UNIT/ML SOPN 25 Units. daily     No current facility-administered medications for this visit.    OBJECTIVE:  African-American man in no acute distress Vitals:   03/07/21 1124  BP: 130/60  Pulse: 67  Resp: 17  Temp: 97.7 F (36.5 C)  SpO2: 99%     Body mass index is 30.45 kg/m.   Wt Readings from Last 3 Encounters:  03/07/21 224 lb 8 oz (101.8 kg)  09/20/20 233 lb 1.6 oz (105.7 kg)  05/03/20 230 lb (104.3 kg)      ECOG FS:0 - Asymptomatic  Sclerae unicteric, EOMs intact Wearing a mask No cervical or supraclavicular adenopathy, no axillary adenopathy Lungs no rales or rhonchi Heart regular rate and rhythm Abd soft, nontender, positive bowel sounds MSK no focal spinal tenderness, no upper extremity lymphedema Neuro: nonfocal, well oriented, appropriate affect   LAB RESULTS:  CMP     Component Value Date/Time   NA 139 09/20/2020 1115   K 4.3 09/20/2020 1115   CL 103 09/20/2020 1115   CO2 30 09/20/2020 1115   GLUCOSE 146 (H) 09/20/2020 1115   BUN 26 (H) 09/20/2020 1115   CREATININE 1.63 (H) 09/20/2020 1115   CALCIUM 9.2 09/20/2020 1115   PROT 7.5 09/20/2020 1115   ALBUMIN 4.1 09/20/2020 1115   AST 18 09/20/2020 1115   ALT 21 09/20/2020 1115   ALKPHOS 85 09/20/2020 1115    BILITOT 0.7 09/20/2020 1115   GFRNONAA 46 (L) 09/20/2020 1115   GFRAA 51 (L) 05/03/2020 0940    No results found for: TOTALPROTELP, ALBUMINELP, A1GS, A2GS, BETS, BETA2SER, GAMS, MSPIKE, SPEI  No results found for: KPAFRELGTCHN, LAMBDASER, KAPLAMBRATIO  Lab Results  Component Value Date   WBC 11.1 (H) 03/07/2021   NEUTROABS PENDING 03/07/2021   HGB 14.1 03/07/2021   HCT 44.0 03/07/2021   MCV 86.1 03/07/2021   PLT 105 (L) 03/07/2021      Chemistry      Component Value Date/Time   NA 139 09/20/2020 1115  K 4.3 09/20/2020 1115   CL 103 09/20/2020 1115   CO2 30 09/20/2020 1115   BUN 26 (H) 09/20/2020 1115   CREATININE 1.63 (H) 09/20/2020 1115      Component Value Date/Time   CALCIUM 9.2 09/20/2020 1115   ALKPHOS 85 09/20/2020 1115   AST 18 09/20/2020 1115   ALT 21 09/20/2020 1115   BILITOT 0.7 09/20/2020 1115       No results found for: LABCA2  No components found for: CMKLKJ179  No results for input(s): INR in the last 168 hours.  No results found for: LABCA2  No results found for: XTA569  No results found for: VXY801  No results found for: KPV374  No results found for: CA2729  No components found for: HGQUANT  No results found for: CEA1 / No results found for: CEA1   No results found for: AFPTUMOR  No results found for: [place]  No results found for: HGBA, HGBA2QUANT, HGBFQUANT, HGBSQUAN (Hemoglobinopathy evaluation)   Lab Results  Component Value Date   LDH 156 09/20/2020    No results found for: IRON, TIBC, IRONPCTSAT (Iron and TIBC)  No results found for: FERRITIN  Urinalysis    Component Value Date/Time   COLORURINE STRAW (A) 11/23/2018 2018   APPEARANCEUR CLEAR 11/23/2018 2018   LABSPEC 1.020 11/23/2018 2018   PHURINE 5.0 11/23/2018 2018   GLUCOSEU >=500 (A) 11/23/2018 2018   [place] NEGATIVE 11/23/2018 2018   [place] NEGATIVE 11/23/2018 2018   [place] 11/23/2018 2018   PROTEINUR NEGATIVE 11/23/2018 2018    NITRITE NEGATIVE 11/23/2018 2018   LEUKOCYTESUR NEGATIVE 11/23/2018 2018    STUDIES: No results found.   ELIGIBLE FOR AVAILABLE RESEARCH PROTOCOL: no   ASSESSMENT: 65 y.o. [place], [place] man with a diagnosis of chronic lymphoid leukemia made 11/04/2018  (a) the cells are positive for CD 04/12/19/22/23, kappa restricted  (1) Starting Rituximab weekly x 9 weeks beginning 11/23/2018  (a) reaction to first dose (which was 100 mg only).  (2) on maintenance rituximab, first dose 03/07/2019, repeated every 3 months  (a) baseline PET scan obtained on 02/16/2020 was [place] 2 except for the left iliac nodes, with SUV max 3.1  (b) rituximab decreased to every 6 months beginning October 2021   PLAN: Cody [name] is now 2-1/2 years out from initial diagnosis of chronic lymphoid leukemia.  He has no symptoms related to his disease.  His counts are very stable on the current treatment and he is receiving rituximab every 33month without any side effects from the treatment.  He does have some access problems.  We discussed the possibility of a port.  He would need however to have the port flushed every 6 weeks which is inconvenient and really he does not want any surgery or any "cutting".  He is only getting the treatment twice a year at this point.  All in all then we are going to continue treating him peripherally so long as it is possible to do that  He has an excellent exercise program and I commended him for that.  He will return to see me in 6 months with his next Rituxan treatment.  They know to call for any other issue that may develop before then.  He is a candidate for Evusheld and I have added him to our treatment list.  Total encounter time 25 minutes.*Sarajane JewsC. Jenasia Dolinar, MD 03/07/21 11:38 AM Medical Oncology [facility] H[facility] [place] [id number]. [phone]  Fax. [phone]   I, Wilburn Mylar, am acting as scribe for Dr.  Virgie [name]. Shelia Magallon.  I, Lurline Del MD, have reviewed the above documentation for accuracy and completeness, and I agree with the above.   *Total Encounter Time as defined by the Centers for Medicare and Medicaid Services includes, in addition to the face-to-face time of a patient visit (documented in the note above) non-face-to-face time: obtaining and reviewing outside history, ordering and reviewing medications, tests or procedures, care coordination (communications with other health care professionals or caregivers) and documentation in the medical record.

## 2021-03-07 ENCOUNTER — Other Ambulatory Visit: Payer: Self-pay

## 2021-03-07 ENCOUNTER — Inpatient Hospital Stay: Payer: Medicare PPO

## 2021-03-07 ENCOUNTER — Inpatient Hospital Stay (HOSPITAL_BASED_OUTPATIENT_CLINIC_OR_DEPARTMENT_OTHER): Payer: Medicare PPO | Admitting: Oncology

## 2021-03-07 ENCOUNTER — Inpatient Hospital Stay: Payer: Medicare PPO | Attending: Oncology

## 2021-03-07 ENCOUNTER — Other Ambulatory Visit: Payer: Self-pay | Admitting: Adult Health

## 2021-03-07 VITALS — BP 130/60 | HR 67 | Temp 97.7°F | Resp 17 | Ht 72.0 in | Wt 224.5 lb

## 2021-03-07 DIAGNOSIS — C9110 Chronic lymphocytic leukemia of B-cell type not having achieved remission: Secondary | ICD-10-CM

## 2021-03-07 DIAGNOSIS — Z5112 Encounter for antineoplastic immunotherapy: Secondary | ICD-10-CM | POA: Diagnosis not present

## 2021-03-07 LAB — CBC WITH DIFFERENTIAL/PLATELET
Abs Immature Granulocytes: 0.04 10*3/uL (ref 0.00–0.07)
Basophils Absolute: 0 10*3/uL (ref 0.0–0.1)
Basophils Relative: 0 %
Eosinophils Absolute: 0.1 10*3/uL (ref 0.0–0.5)
Eosinophils Relative: 1 %
HCT: 44 % (ref 39.0–52.0)
Hemoglobin: 14.1 g/dL (ref 13.0–17.0)
Immature Granulocytes: 0 %
Lymphocytes Relative: 52 %
Lymphs Abs: 5.7 10*3/uL — ABNORMAL HIGH (ref 0.7–4.0)
MCH: 27.6 pg (ref 26.0–34.0)
MCHC: 32 g/dL (ref 30.0–36.0)
MCV: 86.1 fL (ref 80.0–100.0)
Monocytes Absolute: 0.4 10*3/uL (ref 0.1–1.0)
Monocytes Relative: 3 %
Neutro Abs: 4.9 10*3/uL (ref 1.7–7.7)
Neutrophils Relative %: 44 %
Platelets: 105 10*3/uL — ABNORMAL LOW (ref 150–400)
RBC: 5.11 MIL/uL (ref 4.22–5.81)
RDW: 13.8 % (ref 11.5–15.5)
WBC: 11.1 10*3/uL — ABNORMAL HIGH (ref 4.0–10.5)
nRBC: 0 % (ref 0.0–0.2)

## 2021-03-07 LAB — COMPREHENSIVE METABOLIC PANEL
ALT: 23 U/L (ref 0–44)
AST: 26 U/L (ref 15–41)
Albumin: 4.6 g/dL (ref 3.5–5.0)
Alkaline Phosphatase: 95 U/L (ref 38–126)
Anion gap: 12 (ref 5–15)
BUN: 25 mg/dL — ABNORMAL HIGH (ref 8–23)
CO2: 26 mmol/L (ref 22–32)
Calcium: 9.7 mg/dL (ref 8.9–10.3)
Chloride: 101 mmol/L (ref 98–111)
Creatinine, Ser: 1.45 mg/dL — ABNORMAL HIGH (ref 0.61–1.24)
GFR, Estimated: 53 mL/min — ABNORMAL LOW (ref >60)
Glucose, Bld: 111 mg/dL — ABNORMAL HIGH (ref 70–99)
Potassium: 4 mmol/L (ref 3.5–5.1)
Sodium: 139 mmol/L (ref 135–145)
Total Bilirubin: 1.5 mg/dL — ABNORMAL HIGH (ref 0.3–1.2)
Total Protein: 8.1 g/dL (ref 6.5–8.1)

## 2021-03-07 LAB — LACTATE DEHYDROGENASE: LDH: 161 U/L (ref 98–192)

## 2021-03-07 MED ORDER — ACETAMINOPHEN 325 MG PO TABS
ORAL_TABLET | ORAL | Status: AC
  Filled 2021-03-07: qty 2

## 2021-03-07 MED ORDER — FAMOTIDINE IN NACL 20-0.9 MG/50ML-% IV SOLN
20.0000 mg | Freq: Once | INTRAVENOUS | Status: AC
  Administered 2021-03-07: 20 mg via INTRAVENOUS

## 2021-03-07 MED ORDER — DIPHENHYDRAMINE HCL 25 MG PO CAPS
ORAL_CAPSULE | ORAL | Status: AC
  Filled 2021-03-07: qty 2

## 2021-03-07 MED ORDER — DIPHENHYDRAMINE HCL 25 MG PO CAPS
25.0000 mg | ORAL_CAPSULE | Freq: Once | ORAL | Status: AC
  Administered 2021-03-07: 25 mg via ORAL

## 2021-03-07 MED ORDER — FAMOTIDINE IN NACL 20-0.9 MG/50ML-% IV SOLN
INTRAVENOUS | Status: AC
  Filled 2021-03-07: qty 50

## 2021-03-07 MED ORDER — SODIUM CHLORIDE 0.9 % IV SOLN
Freq: Once | INTRAVENOUS | Status: AC
  Filled 2021-03-07: qty 250

## 2021-03-07 MED ORDER — SODIUM CHLORIDE 0.9 % IV SOLN
375.0000 mg/m2 | Freq: Once | INTRAVENOUS | Status: AC
  Administered 2021-03-07: 900 mg via INTRAVENOUS
  Filled 2021-03-07: qty 50

## 2021-03-07 MED ORDER — ACETAMINOPHEN 325 MG PO TABS
650.0000 mg | ORAL_TABLET | Freq: Once | ORAL | Status: AC
  Administered 2021-03-07: 650 mg via ORAL

## 2021-03-07 NOTE — Patient Instructions (Signed)
[facility] Discharge Instructions for Patients Receiving Chemotherapy  Today you received the following chemotherapy agents:  Rituxan.  To help prevent nausea and vomiting after your treatment, we encourage you to take your nausea medication as directed.   If you develop nausea and vomiting that is not controlled by your nausea medication, call the clinic.   BELOW ARE SYMPTOMS THAT SHOULD BE REPORTED IMMEDIATELY:  *FEVER GREATER THAN 100.5 F  *CHILLS WITH OR WITHOUT FEVER  NAUSEA AND VOMITING THAT IS NOT CONTROLLED WITH YOUR NAUSEA MEDICATION  *UNUSUAL SHORTNESS OF BREATH  *UNUSUAL BRUISING OR BLEEDING  TENDERNESS IN MOUTH AND THROAT WITH OR WITHOUT PRESENCE OF ULCERS  *URINARY PROBLEMS  *BOWEL PROBLEMS  UNUSUAL RASH Items with * indicate a potential emergency and should be followed up as soon as possible.  Feel free to call the clinic should you have any questions or concerns. The clinic phone number is [phone].  Please show the CHEMO ALERT CARD at check-in to the Emergency Department and triage nurse.   

## 2021-03-07 NOTE — Progress Notes (Signed)
Referral placed for evusheld per Dr. Jana Hakim

## 2021-03-08 LAB — BETA 2 MICROGLOBULIN, SERUM: Beta-2 Microglobulin: 3.2 mg/L — ABNORMAL HIGH (ref 0.6–2.4)

## 2021-03-17 ENCOUNTER — Other Ambulatory Visit: Payer: Self-pay | Admitting: Physician Assistant

## 2021-03-17 DIAGNOSIS — C9110 Chronic lymphocytic leukemia of B-cell type not having achieved remission: Secondary | ICD-10-CM

## 2021-03-17 NOTE — Progress Notes (Signed)
I connected by phone with Cody [name] on 03/17/2021, 7:03 PM to discuss the potential use of a new treatment, tixagevimab/cilgavimab, for pre-exposure prophylaxis for prevention of coronavirus disease 2019 (COVID-19) caused by the SARS-CoV-2 virus.  This patient is a 65 y.o. male that meets the FDA criteria for Emergency Use Authorization of tixagevimab/cilgavimab for pre-exposure prophylaxis of COVID-19 disease. Pt meets following criteria:  Age >12 yr and weight > 40kg  Not currently infected with SARS-CoV-2 and has no known recent exposure to an individual infected with SARS-CoV-2 AND o Who has moderate to severe immune compromise due to a medical condition or receipt of immunosuppressive medications or treatments and may not mount an adequate immune response to COVID-19 vaccination or  o Vaccination with any available COVID-19 vaccine, according to the approved or authorized schedule, is not recommended due to a history of severe adverse reaction (e.g., severe allergic reaction) to a COVID-19 vaccine(s) and/or COVID-19 vaccine component(s).  o Patient meets the following definition of mod-severe immune compromised status: 1. Received B-cell depleting therapies (e.g. rituximab, obinutuzumab, ocrelizumab, alemtuzumab) within last 6 months & age > or = 60 and 6. Other actively treated hematologic malignancies or severe congenital immunodeficiency syndromes  I have spoken and communicated the following to the patient or parent/caregiver regarding COVID monoclonal antibody treatment:  1. FDA has authorized the emergency use of tixagevimab/cilgavimab for the pre-exposure prophylaxis of COVID-19 in patients with moderate-severe immunocompromised status, who meet above EUA criteria.  2. The significant known and potential risks and benefits of COVID monoclonal antibody, and the extent to which such potential risks and benefits are unknown.  3. Information on available alternative treatments and  the risks and benefits of those alternatives, including clinical trials.  4. The patient or parent/caregiver has the option to accept or refuse COVID monoclonal antibody treatment.  After reviewing this information with the patient, agree to receive tixagevimab/cilgavimab  Cody Form, PA-C, 03/17/2021, 7:03 PM

## 2021-03-18 ENCOUNTER — Telehealth: Payer: Self-pay | Admitting: Oncology

## 2021-03-18 NOTE — Telephone Encounter (Signed)
Scheduled appt per 4/24 sch msg. Called pt, no answer. Left msg with appt date and time.

## 2021-03-25 ENCOUNTER — Encounter: Payer: Self-pay | Admitting: Oncology

## 2021-03-25 NOTE — Progress Notes (Signed)
Spoke to pt's sister, Ms. Matlock about applying for more copay assistance since pt's grant w/ [employer] expired on 03/23/21. She gave me consent to apply in pt's behalf so I completed the online application w/ the [employer] for Rituxan and his application has been conditionally approved, however, the patient must submit the most recent copy of 1040 or 1040EZ as proof of income for review in the next 15 days by fax or mail.  If the pt fails to submit the proof of income within 15 days, the grant will be suspended.  I have notified the pt's sister of this info and gave her the foundation's fax number.  She verbalized understanding.

## 2021-03-28 DIAGNOSIS — I129 Hypertensive chronic kidney disease with stage 1 through stage 4 chronic kidney disease, or unspecified chronic kidney disease: Secondary | ICD-10-CM | POA: Diagnosis not present

## 2021-03-28 DIAGNOSIS — K7460 Unspecified cirrhosis of liver: Secondary | ICD-10-CM | POA: Diagnosis not present

## 2021-03-28 DIAGNOSIS — Z794 Long term (current) use of insulin: Secondary | ICD-10-CM | POA: Diagnosis not present

## 2021-03-28 DIAGNOSIS — E1122 Type 2 diabetes mellitus with diabetic chronic kidney disease: Secondary | ICD-10-CM | POA: Diagnosis not present

## 2021-03-28 DIAGNOSIS — F70 Mild intellectual disabilities: Secondary | ICD-10-CM | POA: Diagnosis not present

## 2021-04-05 ENCOUNTER — Other Ambulatory Visit: Payer: Self-pay | Admitting: Adult Health

## 2021-04-05 ENCOUNTER — Telehealth: Payer: Self-pay | Admitting: Oncology

## 2021-04-05 ENCOUNTER — Encounter: Payer: Self-pay | Admitting: Oncology

## 2021-04-05 NOTE — Progress Notes (Signed)
Canceled Evusheld orders while we discuss further with patient's legal guardian on Monday.    Wilber Bihari

## 2021-04-05 NOTE — Telephone Encounter (Signed)
Cancelled appt per 5/13 sch msg. Spoke to pt's sister who said they wanted more information about the Evusheld inj. Before r/s.

## 2021-04-08 ENCOUNTER — Other Ambulatory Visit: Payer: Self-pay | Admitting: Oncology

## 2021-04-08 ENCOUNTER — Inpatient Hospital Stay: Payer: Medicare PPO

## 2021-04-08 DIAGNOSIS — C9110 Chronic lymphocytic leukemia of B-cell type not having achieved remission: Secondary | ICD-10-CM

## 2021-04-08 NOTE — Progress Notes (Signed)
I connected by phone with Cody [name] on 04/08/2021, 3:53 PM to discuss the potential use of a new treatment, tixagevimab/cilgavimab, for pre-exposure prophylaxis for prevention of coronavirus disease 2019 (COVID-19) caused by the SARS-CoV-2 virus.  This patient is a 65 y.o. male that meets the FDA criteria for Emergency Use Authorization of tixagevimab/cilgavimab for pre-exposure prophylaxis of COVID-19 disease. Pt meets following criteria:  Age >12 yr and weight > 40kg  Not currently infected with SARS-CoV-2 and has no known recent exposure to an individual infected with SARS-CoV-2 AND o Who has moderate to severe immune compromise due to a medical condition or receipt of immunosuppressive medications or treatments and may not mount an adequate immune response to COVID-19 vaccination or  o Vaccination with any available COVID-19 vaccine, according to the approved or authorized schedule, is not recommended due to a history of severe adverse reaction (e.g., severe allergic reaction) to a COVID-19 vaccine(s) and/or COVID-19 vaccine component(s).  o Patient meets the following definition of mod-severe immune compromised status: 1. Received B-cell depleting therapies (e.g. rituximab, obinutuzumab, ocrelizumab, alemtuzumab) within last 6 months & age > or = 19  I have spoken and communicated the following to the patient or parent/caregiver regarding COVID monoclonal antibody treatment:  1. FDA has authorized the emergency use of tixagevimab/cilgavimab for the pre-exposure prophylaxis of COVID-19 in patients with moderate-severe immunocompromised status, who meet above EUA criteria.  2. The significant known and potential risks and benefits of COVID monoclonal antibody, and the extent to which such potential risks and benefits are unknown.  3. Information on available alternative treatments and the risks and benefits of those alternatives, including clinical trials.  4. The patient or  parent/caregiver has the option to accept or refuse COVID monoclonal antibody treatment.  After reviewing this information with the patient, agree to receive tixagevimab/cilgavimab  Cody Cruel, MD, 04/08/2021, 3:53 PM

## 2021-04-16 ENCOUNTER — Ambulatory Visit: Payer: Medicare PPO | Admitting: Podiatry

## 2021-04-23 DIAGNOSIS — I129 Hypertensive chronic kidney disease with stage 1 through stage 4 chronic kidney disease, or unspecified chronic kidney disease: Secondary | ICD-10-CM | POA: Diagnosis not present

## 2021-04-23 DIAGNOSIS — N183 Chronic kidney disease, stage 3 unspecified: Secondary | ICD-10-CM | POA: Diagnosis not present

## 2021-04-23 DIAGNOSIS — C9110 Chronic lymphocytic leukemia of B-cell type not having achieved remission: Secondary | ICD-10-CM | POA: Diagnosis not present

## 2021-04-23 DIAGNOSIS — E1122 Type 2 diabetes mellitus with diabetic chronic kidney disease: Secondary | ICD-10-CM | POA: Diagnosis not present

## 2021-05-01 ENCOUNTER — Other Ambulatory Visit: Payer: Self-pay

## 2021-05-01 ENCOUNTER — Encounter: Payer: Self-pay | Admitting: Podiatry

## 2021-05-01 ENCOUNTER — Ambulatory Visit (INDEPENDENT_AMBULATORY_CARE_PROVIDER_SITE_OTHER): Payer: Medicare PPO | Admitting: Podiatry

## 2021-05-01 DIAGNOSIS — M79675 Pain in left toe(s): Secondary | ICD-10-CM | POA: Diagnosis not present

## 2021-05-01 DIAGNOSIS — E1151 Type 2 diabetes mellitus with diabetic peripheral angiopathy without gangrene: Secondary | ICD-10-CM | POA: Diagnosis not present

## 2021-05-01 DIAGNOSIS — B351 Tinea unguium: Secondary | ICD-10-CM

## 2021-05-01 DIAGNOSIS — M79674 Pain in right toe(s): Secondary | ICD-10-CM | POA: Diagnosis not present

## 2021-05-01 NOTE — Progress Notes (Signed)
This patient presents to the office with chief complaint of long thick painful nails.  Patient says the nails are painful walking and wearing shoes.  This patient is unable to self treat.  This patient is unable to trim his nails since he is unable to reach her nails.  he presents to the office for preventative foot care services.  Patient is brought to the office by his sister.  General Appearance  Alert, conversant and in no acute stress.  Vascular  Dorsalis pedis and posterior tibial  pulses are palpable  bilaterally.  Capillary return is within normal limits  bilaterally. Temperature is within normal limits  bilaterally.  Neurologic  Senn-Weinstein monofilament wire test within normal limits  bilaterally. Muscle power within normal limits bilaterally.  Nails Thick disfigured discolored nails with subungual debris  from hallux to fifth toes bilaterally. No evidence of bacterial infection or drainage bilaterally.   Orthopedic  No limitations of motion  feet .  No crepitus or effusions noted.  No bony pathology or digital deformities noted.  Skin  normotropic skin with no porokeratosis noted bilaterally.  No signs of infections or ulcers noted.     Onychomycosis  Nails  B/L.  Pain in right toes  Pain in left toes  Debridement of nails both feet followed trimming the nails with dremel tool.      RTC  3  months.   Gardiner Barefoot DPM

## 2021-05-16 DIAGNOSIS — E876 Hypokalemia: Secondary | ICD-10-CM | POA: Diagnosis not present

## 2021-05-23 DIAGNOSIS — E1122 Type 2 diabetes mellitus with diabetic chronic kidney disease: Secondary | ICD-10-CM | POA: Diagnosis not present

## 2021-05-23 DIAGNOSIS — I129 Hypertensive chronic kidney disease with stage 1 through stage 4 chronic kidney disease, or unspecified chronic kidney disease: Secondary | ICD-10-CM | POA: Diagnosis not present

## 2021-05-23 DIAGNOSIS — N183 Chronic kidney disease, stage 3 unspecified: Secondary | ICD-10-CM | POA: Diagnosis not present

## 2021-06-27 DIAGNOSIS — Z86010 Personal history of colonic polyps: Secondary | ICD-10-CM | POA: Diagnosis not present

## 2021-06-27 DIAGNOSIS — K5900 Constipation, unspecified: Secondary | ICD-10-CM | POA: Diagnosis not present

## 2021-06-27 DIAGNOSIS — Z1211 Encounter for screening for malignant neoplasm of colon: Secondary | ICD-10-CM | POA: Diagnosis not present

## 2021-06-27 DIAGNOSIS — K5730 Diverticulosis of large intestine without perforation or abscess without bleeding: Secondary | ICD-10-CM | POA: Diagnosis not present

## 2021-07-09 ENCOUNTER — Encounter: Payer: Self-pay | Admitting: Oncology

## 2021-07-24 DIAGNOSIS — C9110 Chronic lymphocytic leukemia of B-cell type not having achieved remission: Secondary | ICD-10-CM | POA: Diagnosis not present

## 2021-07-24 DIAGNOSIS — N183 Chronic kidney disease, stage 3 unspecified: Secondary | ICD-10-CM | POA: Diagnosis not present

## 2021-07-24 DIAGNOSIS — E1122 Type 2 diabetes mellitus with diabetic chronic kidney disease: Secondary | ICD-10-CM | POA: Diagnosis not present

## 2021-07-24 DIAGNOSIS — I129 Hypertensive chronic kidney disease with stage 1 through stage 4 chronic kidney disease, or unspecified chronic kidney disease: Secondary | ICD-10-CM | POA: Diagnosis not present

## 2021-07-30 DIAGNOSIS — E1122 Type 2 diabetes mellitus with diabetic chronic kidney disease: Secondary | ICD-10-CM | POA: Diagnosis not present

## 2021-07-30 DIAGNOSIS — D1723 Benign lipomatous neoplasm of skin and subcutaneous tissue of right leg: Secondary | ICD-10-CM | POA: Diagnosis not present

## 2021-07-30 DIAGNOSIS — I10 Essential (primary) hypertension: Secondary | ICD-10-CM | POA: Diagnosis not present

## 2021-07-30 DIAGNOSIS — N183 Chronic kidney disease, stage 3 unspecified: Secondary | ICD-10-CM | POA: Diagnosis not present

## 2021-07-30 DIAGNOSIS — K7030 Alcoholic cirrhosis of liver without ascites: Secondary | ICD-10-CM | POA: Diagnosis not present

## 2021-08-01 DIAGNOSIS — E119 Type 2 diabetes mellitus without complications: Secondary | ICD-10-CM | POA: Diagnosis not present

## 2021-08-07 ENCOUNTER — Ambulatory Visit (INDEPENDENT_AMBULATORY_CARE_PROVIDER_SITE_OTHER): Payer: Medicare PPO | Admitting: Podiatry

## 2021-08-07 ENCOUNTER — Encounter: Payer: Self-pay | Admitting: Podiatry

## 2021-08-07 ENCOUNTER — Other Ambulatory Visit: Payer: Self-pay

## 2021-08-07 DIAGNOSIS — M79674 Pain in right toe(s): Secondary | ICD-10-CM

## 2021-08-07 DIAGNOSIS — E1151 Type 2 diabetes mellitus with diabetic peripheral angiopathy without gangrene: Secondary | ICD-10-CM

## 2021-08-07 DIAGNOSIS — M79675 Pain in left toe(s): Secondary | ICD-10-CM | POA: Diagnosis not present

## 2021-08-07 DIAGNOSIS — B351 Tinea unguium: Secondary | ICD-10-CM | POA: Diagnosis not present

## 2021-08-07 NOTE — Progress Notes (Signed)
This patient presents to the office with chief complaint of long thick painful nails.  Patient says the nails are painful walking and wearing shoes.  This patient is unable to self treat.  This patient is unable to trim his nails since he is unable to reach her nails.  he presents to the office for preventative foot care services.    General Appearance  Alert, conversant and in no acute stress.  Vascular  Dorsalis pedis and posterior tibial  pulses are palpable  bilaterally.  Capillary return is within normal limits  bilaterally. Temperature is within normal limits  bilaterally.  Neurologic  Senn-Weinstein monofilament wire test within normal limits  bilaterally. Muscle power within normal limits bilaterally.  Nails Thick disfigured discolored nails with subungual debris  from hallux to fifth toes bilaterally. No evidence of bacterial infection or drainage bilaterally.   Orthopedic  No limitations of motion  feet .  No crepitus or effusions noted.  No bony pathology .  Syndactaly 2-3 left foot.  Skin  normotropic skin with no porokeratosis noted bilaterally.  No signs of infections or ulcers noted.     Onychomycosis  Nails  B/L.  Pain in right toes  Pain in left toes  Debridement of nails both feet followed trimming the nails with dremel tool.      RTC  3  months.   Milanya Sunderland DPM  

## 2021-08-14 DIAGNOSIS — Z1211 Encounter for screening for malignant neoplasm of colon: Secondary | ICD-10-CM | POA: Diagnosis not present

## 2021-08-14 DIAGNOSIS — K5730 Diverticulosis of large intestine without perforation or abscess without bleeding: Secondary | ICD-10-CM | POA: Diagnosis not present

## 2021-08-14 DIAGNOSIS — Z86010 Personal history of colonic polyps: Secondary | ICD-10-CM | POA: Diagnosis not present

## 2021-08-14 DIAGNOSIS — K635 Polyp of colon: Secondary | ICD-10-CM | POA: Diagnosis not present

## 2021-08-14 DIAGNOSIS — D124 Benign neoplasm of descending colon: Secondary | ICD-10-CM | POA: Diagnosis not present

## 2021-08-23 DIAGNOSIS — C9110 Chronic lymphocytic leukemia of B-cell type not having achieved remission: Secondary | ICD-10-CM | POA: Diagnosis not present

## 2021-08-23 DIAGNOSIS — I129 Hypertensive chronic kidney disease with stage 1 through stage 4 chronic kidney disease, or unspecified chronic kidney disease: Secondary | ICD-10-CM | POA: Diagnosis not present

## 2021-08-23 DIAGNOSIS — N183 Chronic kidney disease, stage 3 unspecified: Secondary | ICD-10-CM | POA: Diagnosis not present

## 2021-08-23 DIAGNOSIS — E1122 Type 2 diabetes mellitus with diabetic chronic kidney disease: Secondary | ICD-10-CM | POA: Diagnosis not present

## 2021-08-26 ENCOUNTER — Encounter: Payer: Self-pay | Admitting: Oncology

## 2021-09-01 NOTE — Progress Notes (Signed)
[facility]  [phone] [phone]     ID: Cody [name] DOB: 08/21/1956  MR#: [id number]  JYN#:[id number]  Patient Care Team: [name], MD as PCP - General (Family Medicine) Magrinat, Virgie Dad, MD as Consulting Physician (Oncology) Chauncey Cruel, MD OTHER MD:  CHIEF COMPLAINT: Chronic lymphoid leukemia  CURRENT TREATMENT: Rituximab (Q 6 months)    INTERVAL HISTORY: Cody [name] returns today for follow-up and treatment of his chronic lymphoid leukemia. He is accompanied by his sister Cody [name]  He continues on maintenance Rituxumab with a dose today.  He tolerates this well, with no side effects that he is aware of.   His absolute lymphocyte count has increased slightly since the last treatment.  0.7 - 4.0 K/uL 8.6 High   5.7 High   2.6  1.4     REVIEW OF SYSTEMS: Cody [name] is doing fine.  He goes to the "center" daily and gets exercises and other activities there.  He just had a colonoscopy under Dr. Collene Mares and they tell me that was fine.  There have been no intercurrent fevers, drenching sweats, unexplained fatigue or weight loss, or adenopathy.  A detailed review of systems today was otherwise stable.   COVID 19 VACCINATION STATUS: x3; scheduled for Evusheld 04/05/2021 but never received it   HISTORY OF CURRENT ILLNESS: From the original intake note:  Cody [name] was evaluated by his primary care physician and found to have a high white cell count.  He was asked to go to the emergency room which the patient did on 11/04/2018 at 8 PM.  The patient was found to be stable, with a white cell count of greater than 150,000, most of which were lymphocytes.  A pathology smear review by Dr. Reuel Derby  showed lymphocytosis and smudge cells.  There were no blasts.  RBCs and platelets were unremarkable.  The patient was referred to our clinic for further evaluation  Cody [name] subsequent history is as detailed below.   PAST MEDICAL  HISTORY: Past Medical History:  Diagnosis Date   Alcoholic cirrhosis ([place])    Cataract    Diabetes mellitus without complication ([place])    History of alcohol abuse    Hypercholesterolemia   Hypertension, hypercholesterolemia, cataracts, seasonal allergies, cirrhosis, history of EtOH abuse   PAST SURGICAL HISTORY: No past surgical history on file. Surgery for incarcerated bowel release   FAMILY HISTORY Family History  Problem Relation Age of Onset   Alcohol abuse Father    Cirrhosis Father    Coronary artery disease Father    The patient's father died from cirrhosis at age 56 in the setting of alcohol abuse; he also had significant coronary artery disease.  The patient's mother is living, 64 years old as of December 2019.  The patient has 2 sisters, no brothers.  They are not aware of any cancer history in the family   SOCIAL HISTORY:  The patient used to work in [employer] for the city of [place].  He was briefly married but the marriage has been annulled.  The patient currently lives with his sister Martavion Couper and their mother.  A second sister, Charma Igo, lives in [place].   ADVANCED DIRECTIVES: The patient Sister [name] is his healthcare power of attorney.  She can be reached at [phone].   HEALTH MAINTENANCE: Social History   Tobacco Use   Smoking status: Never   Smokeless tobacco: Never  Substance Use Topics   Alcohol use: Not Currently   Drug  use: No    Colonoscopy: September 2022 (man)  PSA:  Bone density:   No Known Allergies  Current Outpatient Medications  Medication Sig Dispense Refill   allopurinol (ZYLOPRIM) 300 MG tablet TAKE 1 TABLET DAILY 90 tablet 3   amLODipine (NORVASC) 10 MG tablet Take 10 mg by mouth daily.     atorvastatin (LIPITOR) 10 MG tablet Take 10 mg by mouth daily.     B-D UF III MINI PEN NEEDLES 31G X 5 MM MISC      Blood Glucose Monitoring Suppl (ACCU-CHEK GUIDE) w/Device KIT      Continuous Blood Gluc  Receiver (FREESTYLE LIBRE 14 DAY READER) DEVI USE AS DIRECTED TO CHECK BLOOD SUGAR     Continuous Blood Gluc Sensor (FREESTYLE LIBRE 14 DAY SENSOR) MISC USE AS DIRECTED TO CHECK BLOOD SUGAR     fexofenadine (ALLEGRA) 180 MG tablet Take 180 mg by mouth at bedtime.     FREESTYLE LITE test strip      hydrochlorothiazide (HYDRODIURIL) 25 MG tablet Take 25 mg by mouth daily.     linagliptin (TRADJENTA) 5 MG TABS tablet Take 5 mg by mouth daily.     ondansetron (ZOFRAN) 4 MG tablet TAKE 1 TABLET BY MOUTH EVERY 8 HOURS AS NEEDED FOR NAUSEA AND VOMITING 8 tablet 2   ONETOUCH DELICA LANCETS FINE MISC 3 (three) times daily.   5   telmisartan (MICARDIS) 80 MG tablet Take 80 mg by mouth daily.     TOUJEO SOLOSTAR 300 UNIT/ML SOPN 25 Units. daily     No current facility-administered medications for this visit.    OBJECTIVE:  African-American man who appears well Vitals:   09/02/21 1005  BP: (!) 153/64  Pulse: 81  Resp: 18  Temp: (!) 97.5 F (36.4 C)  SpO2: 98%      Body mass index is 31.59 kg/m.   Wt Readings from Last 3 Encounters:  09/02/21 232 lb 14.4 oz (105.6 kg)  03/07/21 224 lb 8 oz (101.8 kg)  09/20/20 233 lb 1.6 oz (105.7 kg)     ECOG FS:0 - Asymptomatic  Sclerae unicteric, EOMs intact Wearing a mask No cervical or supraclavicular adenopathy, no axillary or inguinal adenopathy Lungs no rales or rhonchi Heart regular rate and rhythm Abd soft, nontender, positive bowel sounds MSK no focal spinal tenderness, no upper extremity lymphedema Neuro: nonfocal, well oriented, appropriate affect   LAB RESULTS:  CMP     Component Value Date/Time   NA 139 03/07/2021 1102   K 4.0 03/07/2021 1102   CL 101 03/07/2021 1102   CO2 26 03/07/2021 1102   GLUCOSE 111 (H) 03/07/2021 1102   BUN 25 (H) 03/07/2021 1102   CREATININE 1.45 (H) 03/07/2021 1102   CALCIUM 9.7 03/07/2021 1102   PROT 8.1 03/07/2021 1102   ALBUMIN 4.6 03/07/2021 1102   AST 26 03/07/2021 1102   ALT 23 03/07/2021  1102   ALKPHOS 95 03/07/2021 1102   BILITOT 1.5 (H) 03/07/2021 1102   GFRNONAA 53 (L) 03/07/2021 1102   GFRAA 51 (L) 05/03/2020 0940    No results found for: TOTALPROTELP, ALBUMINELP, A1GS, A2GS, BETS, BETA2SER, GAMS, MSPIKE, SPEI  No results found for: KPAFRELGTCHN, LAMBDASER, KAPLAMBRATIO  Lab Results  Component Value Date   WBC 13.9 (H) 09/02/2021   NEUTROABS 4.6 09/02/2021   HGB 13.2 09/02/2021   HCT 40.8 09/02/2021   MCV 84.0 09/02/2021   PLT 114 (L) 09/02/2021      Chemistry      Component  Value Date/Time   NA 139 03/07/2021 1102   K 4.0 03/07/2021 1102   CL 101 03/07/2021 1102   CO2 26 03/07/2021 1102   BUN 25 (H) 03/07/2021 1102   CREATININE 1.45 (H) 03/07/2021 1102      Component Value Date/Time   CALCIUM 9.7 03/07/2021 1102   ALKPHOS 95 03/07/2021 1102   AST 26 03/07/2021 1102   ALT 23 03/07/2021 1102   BILITOT 1.5 (H) 03/07/2021 1102       No results found for: LABCA2  No components found for: [id number]  No results for input(s): INR in the last 168 hours.  No results found for: LABCA2  No results found for: WJX914  No results found for: NWG956  No results found for: OZH086  No results found for: CA2729  No components found for: HGQUANT  No results found for: CEA1 / No results found for: CEA1   No results found for: AFPTUMOR  No results found for: [place]  No results found for: HGBA, HGBA2QUANT, HGBFQUANT, HGBSQUAN (Hemoglobinopathy evaluation)   Lab Results  Component Value Date   LDH 161 03/07/2021    No results found for: IRON, TIBC, IRONPCTSAT (Iron and TIBC)  No results found for: FERRITIN  Urinalysis    Component Value Date/Time   COLORURINE STRAW (A) 11/23/2018 2018   APPEARANCEUR CLEAR 11/23/2018 2018   LABSPEC 1.020 11/23/2018 2018   PHURINE 5.0 11/23/2018 2018   GLUCOSEU >=500 (A) 11/23/2018 2018   [place] NEGATIVE 11/23/2018 2018   [place] NEGATIVE 11/23/2018 2018   [place] 11/23/2018 2018    PROTEINUR NEGATIVE 11/23/2018 2018   NITRITE NEGATIVE 11/23/2018 2018   LEUKOCYTESUR NEGATIVE 11/23/2018 2018    STUDIES: No results found.   ELIGIBLE FOR AVAILABLE RESEARCH PROTOCOL: no   ASSESSMENT: 65 y.o. [place], [place] man with a diagnosis of chronic lymphoid leukemia made 11/04/2018  (a) the cells are positive for CD 04/12/19/22/23, kappa restricted  (1) Rituximab weekly x 9 weeks beginning 11/23/2018  (a) reaction to first dose (which was 100 mg only).  (2) on maintenance rituximab, first dose 03/07/2019, repeated every 3 months  (a) baseline PET scan obtained on 02/16/2020 was [place] 2 except for the left iliac nodes, with SUV max 3.1  (b) rituximab decreased to every 6 months beginning October 2021  (3) COVID prevention:  (a) in view [facility]avez scheduled December 2022   PLAN: Cody [name] is now just about 3 years out from initial diagnosis of chronic lymphoid leukemia.his thrombocytopenia is stable.  He has no anemia problems.  He is tolerating maintenance rituximab well and while there has been a slight increase in his absolute lymphocyte count I do not think that warrants a change in therapy at this point.  We discussed his immunocompromise status and although he has had avaccine x3 he is unlikely to have made a good antibody response both because of his CLL and because of its treatment.  I think he is a good candidate for Evusheld and we tried to schedule that for him in May but 1 of his sisters had some questions that apparently we were not able to answer adequately at that time.  I have given them some written information today which hopefully will clarify that and we are scheduling him for Evusheld in December  He will return to see [place] in April for his next rituximab dose.  They know to call for any other issue that may develop before that visit  Total encounter time 25 minutes.*  Virgie Dad. Waymon Laser, MD 09/02/21 10:17 AM Medical Oncology and Hematology [facility] [place], [place] [address] Tel. [phone]    Fax. [phone]   I, Wilburn Mylar, am acting as scribe for Dr. Virgie Dad. Jubal Rademaker.  I, Lurline Del MD, have reviewed the above documentation for accuracy and completeness, and I agree with the above.   *Total Encounter Time as defined by the Centers for Medicare and Medicaid Services includes, in addition to the face-to-face time of a patient visit (documented in the note above) non-face-to-face time: obtaining and reviewing outside history, ordering and reviewing medications, tests or procedures, care coordination (communications with other health care professionals or caregivers) and documentation in the medical record.

## 2021-09-02 ENCOUNTER — Inpatient Hospital Stay: Payer: Medicare PPO | Admitting: Oncology

## 2021-09-02 ENCOUNTER — Inpatient Hospital Stay: Payer: Medicare PPO | Attending: Oncology

## 2021-09-02 ENCOUNTER — Inpatient Hospital Stay: Payer: Medicare PPO

## 2021-09-02 ENCOUNTER — Other Ambulatory Visit: Payer: Self-pay

## 2021-09-02 VITALS — BP 150/80 | HR 86 | Temp 98.4°F | Resp 18

## 2021-09-02 VITALS — BP 153/64 | HR 81 | Temp 97.5°F | Resp 18 | Ht 72.0 in | Wt 232.9 lb

## 2021-09-02 DIAGNOSIS — C9110 Chronic lymphocytic leukemia of B-cell type not having achieved remission: Secondary | ICD-10-CM | POA: Diagnosis not present

## 2021-09-02 DIAGNOSIS — Z5112 Encounter for antineoplastic immunotherapy: Secondary | ICD-10-CM | POA: Insufficient documentation

## 2021-09-02 LAB — CBC WITH DIFFERENTIAL/PLATELET
Abs Immature Granulocytes: 0.06 10*3/uL (ref 0.00–0.07)
Basophils Absolute: 0 10*3/uL (ref 0.0–0.1)
Basophils Relative: 0 %
Eosinophils Absolute: 0.2 10*3/uL (ref 0.0–0.5)
Eosinophils Relative: 1 %
HCT: 40.8 % (ref 39.0–52.0)
Hemoglobin: 13.2 g/dL (ref 13.0–17.0)
Immature Granulocytes: 0 %
Lymphocytes Relative: 63 %
Lymphs Abs: 8.6 10*3/uL — ABNORMAL HIGH (ref 0.7–4.0)
MCH: 27.2 pg (ref 26.0–34.0)
MCHC: 32.4 g/dL (ref 30.0–36.0)
MCV: 84 fL (ref 80.0–100.0)
Monocytes Absolute: 0.5 10*3/uL (ref 0.1–1.0)
Monocytes Relative: 3 %
Neutro Abs: 4.6 10*3/uL (ref 1.7–7.7)
Neutrophils Relative %: 33 %
Platelets: 114 10*3/uL — ABNORMAL LOW (ref 150–400)
RBC: 4.86 MIL/uL (ref 4.22–5.81)
RDW: 13.5 % (ref 11.5–15.5)
WBC: 13.9 10*3/uL — ABNORMAL HIGH (ref 4.0–10.5)
nRBC: 0 % (ref 0.0–0.2)

## 2021-09-02 LAB — COMPREHENSIVE METABOLIC PANEL
ALT: 23 U/L (ref 0–44)
AST: 21 U/L (ref 15–41)
Albumin: 4.1 g/dL (ref 3.5–5.0)
Alkaline Phosphatase: 89 U/L (ref 38–126)
Anion gap: 8 (ref 5–15)
BUN: 24 mg/dL — ABNORMAL HIGH (ref 8–23)
CO2: 27 mmol/L (ref 22–32)
Calcium: 9.4 mg/dL (ref 8.9–10.3)
Chloride: 103 mmol/L (ref 98–111)
Creatinine, Ser: 1.66 mg/dL — ABNORMAL HIGH (ref 0.61–1.24)
GFR, Estimated: 45 mL/min — ABNORMAL LOW (ref >60)
Glucose, Bld: 283 mg/dL — ABNORMAL HIGH (ref 70–99)
Potassium: 4.3 mmol/L (ref 3.5–5.1)
Sodium: 138 mmol/L (ref 135–145)
Total Bilirubin: 1 mg/dL (ref 0.3–1.2)
Total Protein: 7.3 g/dL (ref 6.5–8.1)

## 2021-09-02 LAB — LACTATE DEHYDROGENASE: LDH: 155 U/L (ref 98–192)

## 2021-09-02 MED ORDER — DIPHENHYDRAMINE HCL 50 MG/ML IJ SOLN
50.0000 mg | Freq: Once | INTRAMUSCULAR | Status: AC | PRN
  Administered 2021-09-02: 50 mg via INTRAVENOUS

## 2021-09-02 MED ORDER — MEPERIDINE HCL 25 MG/ML IJ SOLN
INTRAMUSCULAR | Status: AC
  Administered 2021-09-02: 25 mg
  Filled 2021-09-02: qty 1

## 2021-09-02 MED ORDER — METHYLPREDNISOLONE SODIUM SUCC 125 MG IJ SOLR
125.0000 mg | Freq: Once | INTRAMUSCULAR | Status: AC | PRN
  Administered 2021-09-02: 125 mg via INTRAVENOUS

## 2021-09-02 MED ORDER — MEPERIDINE HCL 25 MG/ML IJ SOLN
25.0000 mg | Freq: Once | INTRAMUSCULAR | Status: AC
  Administered 2021-09-02: 25 mg via INTRAVENOUS
  Filled 2021-09-02: qty 1

## 2021-09-02 MED ORDER — DIPHENHYDRAMINE HCL 25 MG PO CAPS
25.0000 mg | ORAL_CAPSULE | Freq: Once | ORAL | Status: AC
  Administered 2021-09-02: 25 mg via ORAL
  Filled 2021-09-02: qty 1

## 2021-09-02 MED ORDER — ACETAMINOPHEN 325 MG PO TABS
650.0000 mg | ORAL_TABLET | Freq: Once | ORAL | Status: AC
  Administered 2021-09-02: 650 mg via ORAL
  Filled 2021-09-02: qty 2

## 2021-09-02 MED ORDER — DIPHENHYDRAMINE HCL 25 MG PO CAPS
ORAL_CAPSULE | ORAL | Status: AC
  Filled 2021-09-02: qty 1

## 2021-09-02 MED ORDER — SODIUM CHLORIDE 0.9 % IV SOLN
375.0000 mg/m2 | Freq: Once | INTRAVENOUS | Status: AC
  Administered 2021-09-02: 900 mg via INTRAVENOUS
  Filled 2021-09-02: qty 50

## 2021-09-02 MED ORDER — FAMOTIDINE 20 MG IN NS 100 ML IVPB
20.0000 mg | Freq: Once | INTRAVENOUS | Status: AC
  Administered 2021-09-02: 20 mg via INTRAVENOUS
  Filled 2021-09-02: qty 100

## 2021-09-02 MED ORDER — SODIUM CHLORIDE 0.9 % IV SOLN: Freq: Once | INTRAVENOUS | Status: AC

## 2021-09-02 NOTE — Progress Notes (Signed)
Hypersensitivity Reaction note  Date of event: 09/02/21 Time of event: 1430 Generic name of drug involved: Rituxan Name of provider notified of the hypersensitivity reaction: Magrinat Was agent that likely caused hypersensitivity reaction added to Allergies List within EMR? Yes Chain of events including reaction signs/symptoms, treatment administered, and outcome (e.g., drug resumed; drug discontinued; sent to Emergency Department; etc.)   Patient was ambulating back from the bathroom after 3 titration increase when it was noted by nursing staff that he has begun to shake uncontrollably. Patient assisted back to infusion chair where infusion was then stopped and normal saline was started at 976ml/hr to flush the line. Dr. Jana Hakim was called and verbal orders were received (see MAR). Patient received in total 50mg  benadryl IV, 125mg  solumedrol IV, and 50 mg demerol IV. Fifteen minutes post emergency medication administration, patient shaking stopped and patient verbalized feeling much improved. Dr. Jana Hakim contacted again, and advised to resume infusion at 50% previous rate, and to call if symptoms resume.   Manuella Ghazi, RN 09/02/2021 3:03 PM

## 2021-09-02 NOTE — Patient Instructions (Signed)
[facility]  Discharge Instructions: Thank you for choosing [facility] to provide your oncology and hematology care.   If you have a lab appointment with the Cancer Center, please go directly to the Cancer Center and check in at the registration area.   Wear comfortable clothing and clothing appropriate for easy access to any Portacath or PICC line.   We strive to give you quality time with your provider. You may need to reschedule your appointment if you arrive late (15 or more minutes).  Arriving late affects you and other patients whose appointments are after yours.  Also, if you miss three or more appointments without notifying the office, you may be dismissed from the clinic at the provider's discretion.      For prescription refill requests, have your pharmacy contact our office and allow 72 hours for refills to be completed.    Today you received the following chemotherapy and/or immunotherapy agents Rituximab      To help prevent nausea and vomiting after your treatment, we encourage you to take your nausea medication as directed.  BELOW ARE SYMPTOMS THAT SHOULD BE REPORTED IMMEDIATELY: *FEVER GREATER THAN 100.4 F (38 C) OR HIGHER *CHILLS OR SWEATING *NAUSEA AND VOMITING THAT IS NOT CONTROLLED WITH YOUR NAUSEA MEDICATION *UNUSUAL SHORTNESS OF BREATH *UNUSUAL BRUISING OR BLEEDING *URINARY PROBLEMS (pain or burning when urinating, or frequent urination) *BOWEL PROBLEMS (unusual diarrhea, constipation, pain near the anus) TENDERNESS IN MOUTH AND THROAT WITH OR WITHOUT PRESENCE OF ULCERS (sore throat, sores in mouth, or a toothache) UNUSUAL RASH, SWELLING OR PAIN  UNUSUAL VAGINAL DISCHARGE OR ITCHING   Items with * indicate a potential emergency and should be followed up as soon as possible or go to the Emergency Department if any problems should occur.  Please show the CHEMOTHERAPY ALERT CARD or IMMUNOTHERAPY ALERT CARD at check-in to  the Emergency Department and triage nurse.  Should you have questions after your visit or need to cancel or reschedule your appointment, please contact [facility]  Dept: [phone]  and follow the prompts.  Office hours are 8:00 a.m. to 4:30 p.m. Monday - Friday. Please note that voicemails left after 4:00 p.m. may not be returned until the following business day.  We are closed weekends and major holidays. You have access to a nurse at all times for urgent questions. Please call the main number to the clinic Dept: [phone] and follow the prompts.   For any non-urgent questions, you may also contact your provider using MyChart. We now offer e-Visits for anyone 18 and older to request care online for non-urgent symptoms. For details visit [email or web address].   Also download the MyChart app! Go to the app store, search "MyChart", open the app, select , and log in with your MyChart username and password.  Due to Covid, a mask is required upon entering the hospital/clinic. If you do not have a mask, one will be given to you upon arrival. For doctor visits, patients may have 1 support person aged 18 or older with them. For treatment visits, patients cannot have anyone with them due to current Covid guidelines and our immunocompromised population.   

## 2021-09-03 LAB — BETA 2 MICROGLOBULIN, SERUM: Beta-2 Microglobulin: 3.9 mg/L — ABNORMAL HIGH (ref 0.6–2.4)

## 2021-09-03 NOTE — Addendum Note (Signed)
Encounter addended by: Pricilla Larsson, [facility] on: 09/03/2021 3:18 PM  Actions taken: Order list changed

## 2021-09-22 DIAGNOSIS — N183 Chronic kidney disease, stage 3 unspecified: Secondary | ICD-10-CM | POA: Diagnosis not present

## 2021-09-22 DIAGNOSIS — I129 Hypertensive chronic kidney disease with stage 1 through stage 4 chronic kidney disease, or unspecified chronic kidney disease: Secondary | ICD-10-CM | POA: Diagnosis not present

## 2021-09-22 DIAGNOSIS — E1122 Type 2 diabetes mellitus with diabetic chronic kidney disease: Secondary | ICD-10-CM | POA: Diagnosis not present

## 2021-09-23 DIAGNOSIS — I129 Hypertensive chronic kidney disease with stage 1 through stage 4 chronic kidney disease, or unspecified chronic kidney disease: Secondary | ICD-10-CM | POA: Diagnosis not present

## 2021-09-23 DIAGNOSIS — E1165 Type 2 diabetes mellitus with hyperglycemia: Secondary | ICD-10-CM | POA: Diagnosis not present

## 2021-09-23 DIAGNOSIS — Z794 Long term (current) use of insulin: Secondary | ICD-10-CM | POA: Diagnosis not present

## 2021-09-23 DIAGNOSIS — E1122 Type 2 diabetes mellitus with diabetic chronic kidney disease: Secondary | ICD-10-CM | POA: Diagnosis not present

## 2021-09-23 DIAGNOSIS — F70 Mild intellectual disabilities: Secondary | ICD-10-CM | POA: Diagnosis not present

## 2021-09-23 DIAGNOSIS — K7030 Alcoholic cirrhosis of liver without ascites: Secondary | ICD-10-CM | POA: Diagnosis not present

## 2021-09-23 DIAGNOSIS — N183 Chronic kidney disease, stage 3 unspecified: Secondary | ICD-10-CM | POA: Diagnosis not present

## 2021-09-24 ENCOUNTER — Encounter: Payer: Self-pay | Admitting: Oncology

## 2021-09-30 ENCOUNTER — Encounter: Payer: Self-pay | Admitting: Oncology

## 2021-09-30 DIAGNOSIS — E785 Hyperlipidemia, unspecified: Secondary | ICD-10-CM | POA: Diagnosis not present

## 2021-09-30 DIAGNOSIS — E1122 Type 2 diabetes mellitus with diabetic chronic kidney disease: Secondary | ICD-10-CM | POA: Diagnosis not present

## 2021-10-01 ENCOUNTER — Other Ambulatory Visit: Payer: Self-pay | Admitting: Oncology

## 2021-10-02 ENCOUNTER — Telehealth: Payer: Self-pay | Admitting: Oncology

## 2021-10-02 NOTE — Telephone Encounter (Signed)
Scheduled per sch msg. Called and left msg of changes in appts

## 2021-10-23 DIAGNOSIS — E1122 Type 2 diabetes mellitus with diabetic chronic kidney disease: Secondary | ICD-10-CM | POA: Diagnosis not present

## 2021-10-23 DIAGNOSIS — I129 Hypertensive chronic kidney disease with stage 1 through stage 4 chronic kidney disease, or unspecified chronic kidney disease: Secondary | ICD-10-CM | POA: Diagnosis not present

## 2021-10-23 DIAGNOSIS — N183 Chronic kidney disease, stage 3 unspecified: Secondary | ICD-10-CM | POA: Diagnosis not present

## 2021-10-24 ENCOUNTER — Inpatient Hospital Stay: Payer: Medicare PPO | Admitting: Oncology

## 2021-10-24 ENCOUNTER — Other Ambulatory Visit: Payer: Self-pay

## 2021-10-24 ENCOUNTER — Inpatient Hospital Stay: Payer: Medicare PPO

## 2021-10-24 ENCOUNTER — Inpatient Hospital Stay: Payer: Medicare PPO | Attending: Oncology

## 2021-10-24 VITALS — BP 169/86 | HR 76 | Temp 98.1°F | Resp 17 | Wt 237.4 lb

## 2021-10-24 DIAGNOSIS — C9110 Chronic lymphocytic leukemia of B-cell type not having achieved remission: Secondary | ICD-10-CM

## 2021-10-24 DIAGNOSIS — Z9221 Personal history of antineoplastic chemotherapy: Secondary | ICD-10-CM | POA: Insufficient documentation

## 2021-10-24 DIAGNOSIS — Z298 Encounter for other specified prophylactic measures: Secondary | ICD-10-CM | POA: Insufficient documentation

## 2021-10-24 LAB — CBC WITH DIFFERENTIAL/PLATELET
Abs Immature Granulocytes: 0.09 10*3/uL — ABNORMAL HIGH (ref 0.00–0.07)
Basophils Absolute: 0 10*3/uL (ref 0.0–0.1)
Basophils Relative: 0 %
Eosinophils Absolute: 0.1 10*3/uL (ref 0.0–0.5)
Eosinophils Relative: 1 %
HCT: 41.4 % (ref 39.0–52.0)
Hemoglobin: 13.1 g/dL (ref 13.0–17.0)
Immature Granulocytes: 1 %
Lymphocytes Relative: 42 %
Lymphs Abs: 4.2 10*3/uL — ABNORMAL HIGH (ref 0.7–4.0)
MCH: 26.6 pg (ref 26.0–34.0)
MCHC: 31.6 g/dL (ref 30.0–36.0)
MCV: 84.1 fL (ref 80.0–100.0)
Monocytes Absolute: 0.4 10*3/uL (ref 0.1–1.0)
Monocytes Relative: 4 %
Neutro Abs: 5 10*3/uL (ref 1.7–7.7)
Neutrophils Relative %: 52 %
Platelets: 116 10*3/uL — ABNORMAL LOW (ref 150–400)
RBC: 4.92 MIL/uL (ref 4.22–5.81)
RDW: 13.7 % (ref 11.5–15.5)
WBC: 9.8 10*3/uL (ref 4.0–10.5)
nRBC: 0 % (ref 0.0–0.2)

## 2021-10-24 LAB — COMPREHENSIVE METABOLIC PANEL
ALT: 20 U/L (ref 0–44)
AST: 18 U/L (ref 15–41)
Albumin: 4.2 g/dL (ref 3.5–5.0)
Alkaline Phosphatase: 92 U/L (ref 38–126)
Anion gap: 9 (ref 5–15)
BUN: 18 mg/dL (ref 8–23)
CO2: 27 mmol/L (ref 22–32)
Calcium: 9 mg/dL (ref 8.9–10.3)
Chloride: 105 mmol/L (ref 98–111)
Creatinine, Ser: 1.42 mg/dL — ABNORMAL HIGH (ref 0.61–1.24)
GFR, Estimated: 54 mL/min — ABNORMAL LOW (ref >60)
Glucose, Bld: 285 mg/dL — ABNORMAL HIGH (ref 70–99)
Potassium: 4.6 mmol/L (ref 3.5–5.1)
Sodium: 141 mmol/L (ref 135–145)
Total Bilirubin: 0.9 mg/dL (ref 0.3–1.2)
Total Protein: 7.5 g/dL (ref 6.5–8.1)

## 2021-10-24 LAB — LACTATE DEHYDROGENASE: LDH: 187 U/L (ref 98–192)

## 2021-10-24 MED ORDER — CILGAVIMAB (PART OF EVUSHELD) INJECTION
300.0000 mg | Freq: Once | INTRAMUSCULAR | Status: AC
  Administered 2021-10-24: 300 mg via INTRAMUSCULAR
  Filled 2021-10-24: qty 3

## 2021-10-24 MED ORDER — TIXAGEVIMAB (PART OF EVUSHELD) INJECTION
300.0000 mg | Freq: Once | INTRAMUSCULAR | Status: AC
  Administered 2021-10-24: 300 mg via INTRAMUSCULAR
  Filled 2021-10-24: qty 3

## 2021-10-24 NOTE — Progress Notes (Signed)
Pt observed for 30 minutes after administration of Evusheld. Ambulatory with no complaints.

## 2021-10-24 NOTE — Patient Instructions (Signed)
Tixagevimab; Cilgavimab Solutions for Injection °What is this medication? °TIXAGEVIMAB; CILGAVIMAB (tix a jev i mab; sil gav i mab) reduces the risk of getting COVID-19 in persons with immune system problems who may not respond properly to the COVID-19 vaccine or persons who can't receive the COVID-19 vaccine. It belongs to a group of medications called monoclonal antibodies. It may decrease the risk of developing severe symptoms of COVID-19. It may also decrease the chance of going to the hospital. This medication is not approved by the FDA. The FDA has authorized emergency use of this medication during the COVID-19 pandemic. °This medicine may be used for other purposes; ask your health care provider or pharmacist if you have questions. °COMMON BRAND NAME(S): EVUSHELD °What should I tell my care team before I take this medication? °They need to know if you have any of these conditions: °any allergies °any serious illness °bleeding disorder °have received a COVID-19 vaccine °heart disease °an unusual or allergic reaction to tixagevimab, cilgavimab, other medications, foods, dyes, or preservatives °pregnant or trying to get pregnant °breast-feeding °How should I use this medication? °This medication is injected into a muscle. It is given by your care team in a hospital or clinic setting. °Talk to your care team about the use of this medication in children. While it may be given to children as young as 12 years, precautions do apply. °Overdosage: If you think you have taken too much of this medicine contact a poison control center or emergency room at once. °NOTE: This medicine is only for you. Do not share this medicine with others. °What if I miss a dose? °Keep appointments for follow-up doses. It is important not to miss your dose. Call your care team if you are unable to keep an appointment. °What may interact with this medication? °COVID-19 vaccines °This list may not describe all possible interactions. Give  your health care provider a list of all the medicines, herbs, non-prescription drugs, or dietary supplements you use. Also tell them if you smoke, drink alcohol, or use illegal drugs. Some items may interact with your medicine. °What should I watch for while using this medication? °Your condition will be monitored carefully while you are receiving this medication. Visit your care team for regular checks on your progress. Tell your care team if your symptoms do not start to get better or if they get worse. °After receiving this medication, wait at least 14 days before getting the COVID-19 vaccine. °What side effects may I notice from receiving this medication? °Side effects that you should report to your care team as soon as possible: °Allergic reactions--skin rash, itching, hives, swelling of the face, lips, tongue, or throat °Heart attack--pain or tightness in the chest, shoulders, arms, or jaw, nausea, shortness of breath, cold or clammy skin, feeling faint or lightheaded °Heart failure--shortness of breath, swelling of the ankles, feet, or hands, sudden weight gain, unusual weakness or fatigue °Heart rhythm changes--fast or irregular heartbeat, dizziness, feeling faint or lightheaded, chest pain, trouble breathing °Side effects that usually do not require medical attention (report these to your care team if they continue or are bothersome): °Cough °Fatigue °Headache °Pain, redness, or irritation at site where injected °This list may not describe all possible side effects. Call your doctor for medical advice about side effects. You may report side effects to FDA at 1-800-FDA-1088. °Where should I keep my medication? °This medication is given in a hospital or clinic. It will not be stored at home. °NOTE: This sheet is   a summary. It may not cover all possible information. If you have questions about this medicine, talk to your doctor, pharmacist, or health care provider. °© 2022 [employer] (2020-11-01  00:00:00) ° °

## 2021-10-24 NOTE — Progress Notes (Addendum)
[place]  [phone] [phone]     ID: Cody [name] DOB: [birth year]  MR#: [id number]  OIT#:[id number]  Patient Care Team: [name], MD as PCP - General (Family Medicine) Korrina Zern, Cody Dad, MD as Consulting Physician (Oncology) Chauncey Cruel, MD OTHER MD:  CHIEF COMPLAINT: Chronic lymphoid leukemia  CURRENT TREATMENT: Rituximab (Q 6 months) ; evusheld   INTERVAL HISTORY: Cody [name] returns today for follow-up and treatment of his chronic lymphoid leukemia. He is accompanied by his sister Cody [name]  He is specifically here today to receive Evusheld.  He continues on maintenance Rituxumab.  His most recent dose was 09/02/2021.  He had a reaction and required Solu-Medrol and Demerol.  He is very concerned regarding this happening again.   REVIEW OF SYSTEMS: Cody [name] gets on the treadmill at least 30 minutes daily.  He is now going back to the barber, which he had missed for the pandemic.  He is also going to some Silver sneakers and senior citizens activities and enjoying those.  He has had no "B" symptoms.  Detailed review of systems was otherwise stable.   COVID 19 VACCINATION STATUS: Vaccinated x3; Evusheld 10/24/2021   HISTORY OF CURRENT ILLNESS: From the original intake note:  [name] was evaluated by his primary care physician and found to have a high white cell count.  He was asked to go to the emergency room which the patient did on 11/04/2018 at 8 PM.  The patient was found to be stable, with a white cell count of greater than 150,000, most of which were lymphocytes.  A pathology smear review by Dr. Reuel Derby  showed lymphocytosis and smudge cells.  There were no blasts.  RBCs and platelets were unremarkable.  The patient was referred to our clinic for further evaluation  [name] subsequent history is as detailed below.   PAST MEDICAL HISTORY: Past Medical History:  Diagnosis Date   Alcoholic cirrhosis ([place])     Cataract    Diabetes mellitus without complication ([place])    History of alcohol abuse    Hypercholesterolemia   Hypertension, hypercholesterolemia, cataracts, seasonal allergies, cirrhosis, history of EtOH abuse   PAST SURGICAL HISTORY: No past surgical history on file. Surgery for incarcerated bowel release   FAMILY HISTORY Family History  Problem Relation Age of Onset   Alcohol abuse Father    Cirrhosis Father    Coronary artery disease Father    The patient's father died from cirrhosis at age 53 in the setting of alcohol abuse; he also had significant coronary artery disease.  The patient's mother is living, 73 years old as of December 2019.  The patient has 2 sisters, no brothers.  They are not aware of any cancer history in the family   SOCIAL HISTORY:  The patient used to work in [employer] for the city of [place].  He was briefly married but the marriage has been annulled.  The patient currently lives with his sister Cody [name] and their mother.  A second sister, Cody [name], lives in [place].   ADVANCED DIRECTIVES: The patient Sister Cody [name] is his healthcare power of attorney.  She can be reached at [phone].   HEALTH MAINTENANCE: Social History   Tobacco Use   Smoking status: Never   Smokeless tobacco: Never  Substance Use Topics   Alcohol use: Not Currently   Drug use: No    Colonoscopy: September 2022 (man)  PSA:  Bone density:   Allergies  Allergen Reactions  Rituxan [Rituximab] Other (See Comments)    Shaking all over and diaphoretic    Current Outpatient Medications  Medication Sig Dispense Refill   allopurinol (ZYLOPRIM) 300 MG tablet TAKE 1 TABLET DAILY 90 tablet 3   amLODipine (NORVASC) 10 MG tablet Take 10 mg by mouth daily.     atorvastatin (LIPITOR) 10 MG tablet Take 10 mg by mouth daily.     B-D UF III MINI PEN NEEDLES 31G X 5 MM MISC      Blood Glucose Monitoring Suppl (ACCU-CHEK GUIDE) w/Device KIT       Continuous Blood Gluc Receiver (FREESTYLE LIBRE 14 DAY READER) DEVI USE AS DIRECTED TO CHECK BLOOD SUGAR     Continuous Blood Gluc Sensor (FREESTYLE LIBRE 14 DAY SENSOR) MISC USE AS DIRECTED TO CHECK BLOOD SUGAR     fexofenadine (ALLEGRA) 180 MG tablet Take 180 mg by mouth at bedtime.     FREESTYLE LITE test strip      hydrochlorothiazide (HYDRODIURIL) 25 MG tablet Take 25 mg by mouth daily.     linagliptin (TRADJENTA) 5 MG TABS tablet Take 5 mg by mouth daily.     ondansetron (ZOFRAN) 4 MG tablet TAKE 1 TABLET BY MOUTH EVERY 8 HOURS AS NEEDED FOR NAUSEA AND VOMITING 8 tablet 2   ONETOUCH DELICA LANCETS FINE MISC 3 (three) times daily.   5   telmisartan (MICARDIS) 80 MG tablet Take 80 mg by mouth daily.     TOUJEO SOLOSTAR 300 UNIT/ML SOPN 25 Units. daily     No current facility-administered medications for this visit.    OBJECTIVE:  African-American man who appears well Vitals:   10/24/21 1316  BP: (!) 169/86  Pulse: 76  Resp: 17  Temp: 98.1 F (36.7 C)  SpO2: 99%       Body mass index is 32.2 kg/m.   Wt Readings from Last 3 Encounters:  10/24/21 237 lb 6.4 oz (107.7 kg)  09/02/21 232 lb 14.4 oz (105.6 kg)  03/07/21 224 lb 8 oz (101.8 kg)     ECOG FS:0 - Asymptomatic  Sclerae unicteric, EOMs intact Wearing a mask No cervical or supraclavicular adenopathy, no axillary adenopathy Lungs no rales or rhonchi Heart regular rate and rhythm Abd soft, nontender, positive bowel sounds MSK no focal spinal tenderness, no upper extremity lymphedema Neuro: nonfocal, well oriented, appropriate affect   LAB RESULTS:  CMP     Component Value Date/Time   NA 141 10/24/2021 1243   K 4.6 10/24/2021 1243   CL 105 10/24/2021 1243   CO2 27 10/24/2021 1243   GLUCOSE 285 (H) 10/24/2021 1243   BUN 18 10/24/2021 1243   CREATININE 1.42 (H) 10/24/2021 1243   CALCIUM 9.0 10/24/2021 1243   PROT 7.5 10/24/2021 1243   ALBUMIN 4.2 10/24/2021 1243   AST 18 10/24/2021 1243   ALT 20 10/24/2021  1243   ALKPHOS 92 10/24/2021 1243   BILITOT 0.9 10/24/2021 1243   GFRNONAA 54 (L) 10/24/2021 1243   GFRAA 51 (L) 05/03/2020 0940    No results found for: TOTALPROTELP, ALBUMINELP, A1GS, A2GS, BETS, BETA2SER, GAMS, MSPIKE, SPEI  No results found for: KPAFRELGTCHN, LAMBDASER, KAPLAMBRATIO  Lab Results  Component Value Date   WBC 9.8 10/24/2021   NEUTROABS 5.0 10/24/2021   HGB 13.1 10/24/2021   HCT 41.4 10/24/2021   MCV 84.1 10/24/2021   PLT 116 (L) 10/24/2021      Chemistry      Component Value Date/Time   NA 141 10/24/2021 1243  K 4.6 10/24/2021 1243   CL 105 10/24/2021 1243   CO2 27 10/24/2021 1243   BUN 18 10/24/2021 1243   CREATININE 1.42 (H) 10/24/2021 1243      Component Value Date/Time   CALCIUM 9.0 10/24/2021 1243   ALKPHOS 92 10/24/2021 1243   AST 18 10/24/2021 1243   ALT 20 10/24/2021 1243   BILITOT 0.9 10/24/2021 1243       No results found for: LABCA2  No components found for: YFVCBS496  No results for input(s): INR in the last 168 hours.  No results found for: LABCA2  No results found for: PRF163  No results found for: WGY659  No results found for: DJT701  No results found for: CA2729  No components found for: HGQUANT  No results found for: CEA1 / No results found for: CEA1   No results found for: AFPTUMOR  No results found for: CHROMOGRNA  No results found for: HGBA, HGBA2QUANT, HGBFQUANT, HGBSQUAN (Hemoglobinopathy evaluation)   Lab Results  Component Value Date   LDH 155 09/02/2021    No results found for: IRON, TIBC, IRONPCTSAT (Iron and TIBC)  No results found for: FERRITIN  Urinalysis    Component Value Date/Time   COLORURINE STRAW (A) 11/23/2018 2018   APPEARANCEUR CLEAR 11/23/2018 2018   [place] 1.020 11/23/2018 2018   [place] 5.0 11/23/2018 2018   GLUCOSEU >=500 (A) 11/23/2018 2018   [place] NEGATIVE 11/23/2018 2018   [place] NEGATIVE 11/23/2018 2018   [place] 11/23/2018 2018   PROTEINUR  NEGATIVE 11/23/2018 2018   NITRITE NEGATIVE 11/23/2018 2018   LEUKOCYTESUR NEGATIVE 11/23/2018 2018    STUDIES: No results found.   ELIGIBLE FOR AVAILABLE RESEARCH PROTOCOL: no   ASSESSMENT: 64 y.o. [place], [place] man with a diagnosis of chronic lymphoid leukemia made 11/04/2018  (a) the cells are positive for CD 04/12/19/22/23, kappa restricted  (1) Rituximab weekly x 9 weeks beginning 11/23/2018  (a) reaction to first dose (which was 100 mg only).  (2) on maintenance rituximab, first dose 03/07/2019, repeated every 3 months  (a) baseline PET scan obtained on 02/16/2020 was [place] 2 except for the left iliac nodes, with SUV max 3.1  (b) rituximab decreased to every 6 months beginning October 2021  (3) COVID prevention:  (a) in view [place] scheduled December 2022   PLAN: Cody [name] is now 3 years out from initial diagnosis of chronic lymphoid leukemia.  His disease is very well controlled on ritoxan every 6 months.  In particular he has no significant cytopenias and no "B" symptoms.  His family had many questions regarding his renal function and his very high glucose levels.  We discussed's at length but those really are being followed through his primary care physician and they tell me he already has a visit coming up later this month.  In particular his blood sugar currently is poorly controlled.  I commended his exercise program.  We discussed evusheld today and they understand that it is a form of passive immunity and should give him some protection against COVID since while it is important for him to receive vaccines they are unlikely to give him as much protection as someone with a normal immune system  He is already scheduled to return in April.  He will receive rituximab at that time.  Given his prior problem with reaction I have added low-dose dexamethasone (despite concerns regarding his blood sugar issues) as well as Pepcid to the usual Tylenol and Benadryl  Total encounter  time 20 minutes.*  Cody [name]. Raine Blodgett, MD 10/24/21 1:42 PM Medical Oncology and Hematology [facility] [place], [place] [address] Tel. [phone]    Fax. [phone]   I, Wilburn Mylar, am acting as scribe for Dr. Virgie [name]. Natsha Guidry.  I, Lurline Del MD, have reviewed the above documentation for accuracy and completeness, and I agree with the above.   *Total Encounter Time as defined by the Centers for Medicare and Medicaid Services includes, in addition to the face-to-face time of a patient visit (documented in the note above) non-face-to-face time: obtaining and reviewing outside history, ordering and reviewing medications, tests or procedures, care coordination (communications with other health care professionals or caregivers) and documentation in the medical record.

## 2021-10-25 ENCOUNTER — Ambulatory Visit: Payer: Medicare PPO

## 2021-10-25 LAB — BETA 2 MICROGLOBULIN, SERUM: Beta-2 Microglobulin: 3.2 mg/L — ABNORMAL HIGH (ref 0.6–2.4)

## 2021-11-05 ENCOUNTER — Ambulatory Visit: Payer: Medicare PPO | Admitting: Podiatry

## 2021-11-22 DIAGNOSIS — C9110 Chronic lymphocytic leukemia of B-cell type not having achieved remission: Secondary | ICD-10-CM | POA: Diagnosis not present

## 2021-11-22 DIAGNOSIS — E1122 Type 2 diabetes mellitus with diabetic chronic kidney disease: Secondary | ICD-10-CM | POA: Diagnosis not present

## 2021-11-22 DIAGNOSIS — N183 Chronic kidney disease, stage 3 unspecified: Secondary | ICD-10-CM | POA: Diagnosis not present

## 2021-11-22 DIAGNOSIS — I129 Hypertensive chronic kidney disease with stage 1 through stage 4 chronic kidney disease, or unspecified chronic kidney disease: Secondary | ICD-10-CM | POA: Diagnosis not present

## 2021-12-10 ENCOUNTER — Encounter: Payer: Self-pay | Admitting: Podiatry

## 2021-12-10 ENCOUNTER — Other Ambulatory Visit: Payer: Self-pay

## 2021-12-10 ENCOUNTER — Ambulatory Visit: Payer: Medicare PPO | Admitting: Podiatry

## 2021-12-10 DIAGNOSIS — M79675 Pain in left toe(s): Secondary | ICD-10-CM | POA: Diagnosis not present

## 2021-12-10 DIAGNOSIS — E1151 Type 2 diabetes mellitus with diabetic peripheral angiopathy without gangrene: Secondary | ICD-10-CM

## 2021-12-10 DIAGNOSIS — M79674 Pain in right toe(s): Secondary | ICD-10-CM | POA: Diagnosis not present

## 2021-12-10 DIAGNOSIS — B351 Tinea unguium: Secondary | ICD-10-CM

## 2021-12-10 NOTE — Progress Notes (Signed)
This patient presents to the office with chief complaint of long thick painful nails.  Patient says the nails are painful walking and wearing shoes.  This patient is unable to self treat.  This patient is unable to trim his nails since he is unable to reach her nails.  he presents to the office for preventative foot care services.    General Appearance  Alert, conversant and in no acute stress.  Vascular  Dorsalis pedis and posterior tibial  pulses are palpable  bilaterally.  Capillary return is within normal limits  bilaterally. Temperature is within normal limits  bilaterally.  Neurologic  Senn-Weinstein monofilament wire test within normal limits  bilaterally. Muscle power within normal limits bilaterally.  Nails Thick disfigured discolored nails with subungual debris  from hallux to fifth toes bilaterally. No evidence of bacterial infection or drainage bilaterally.   Orthopedic  No limitations of motion  feet .  No crepitus or effusions noted.  No bony pathology .  Syndactaly 2-3 left foot.  Skin  normotropic skin with no porokeratosis noted bilaterally.  No signs of infections or ulcers noted.     Onychomycosis  Nails  B/L.  Pain in right toes  Pain in left toes  Debridement of nails both feet followed trimming the nails with dremel tool.      RTC  3  months.   Jayel Scaduto DPM  

## 2021-12-22 DIAGNOSIS — N183 Chronic kidney disease, stage 3 unspecified: Secondary | ICD-10-CM | POA: Diagnosis not present

## 2021-12-22 DIAGNOSIS — C9110 Chronic lymphocytic leukemia of B-cell type not having achieved remission: Secondary | ICD-10-CM | POA: Diagnosis not present

## 2021-12-22 DIAGNOSIS — E1122 Type 2 diabetes mellitus with diabetic chronic kidney disease: Secondary | ICD-10-CM | POA: Diagnosis not present

## 2021-12-22 DIAGNOSIS — I129 Hypertensive chronic kidney disease with stage 1 through stage 4 chronic kidney disease, or unspecified chronic kidney disease: Secondary | ICD-10-CM | POA: Diagnosis not present

## 2022-01-02 IMAGING — CT NM PET TUM IMG INITIAL (PI) SKULL BASE T - THIGH
7 series · 25 of 25 positions shown · non-contrast
Comparison: None.

CLINICAL DATA: Initial treatment strategy for chronic lymphocytic
leukemia.

EXAM:
NUCLEAR MEDICINE PET SKULL BASE TO THIGH
TECHNIQUE: 11.2 mCi F-18 FDG was injected intravenously. Full-ring PET imaging
was performed from the skull base to thigh after the radiotracer. CT
data was obtained and used for attenuation correction and anatomic
localization.
Fasting blood glucose: 153 mg/dl

[Series 3: pet sk_thigh ac · axial · 5.0mm · 4.07mm/px · z∈[-1128,-160]mm · 5 of 243 slices shown]
[im 1/243]
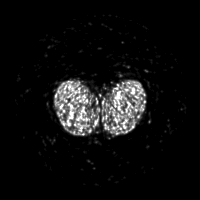
[im 61/243]
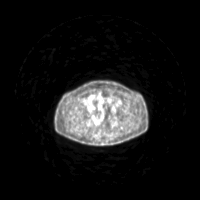
[im 122/243]
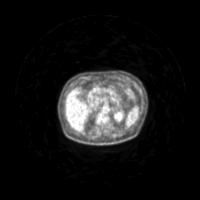
[im 182/243]
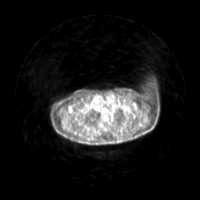
[im 243/243]
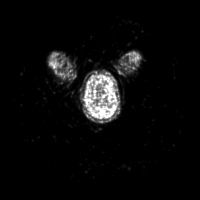

[Series 4: ct sk_thigh 5.0 b31f · axial · 5.0mm · 0.98mm/px · z∈[-1128,-160]mm · 5 of 243 slices shown]
[im 1/243]
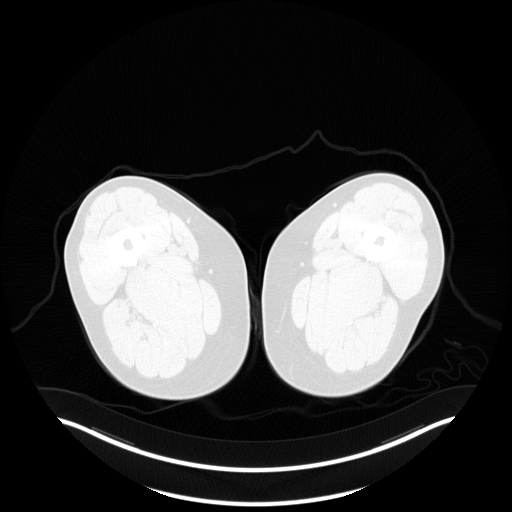
[im 61/243]
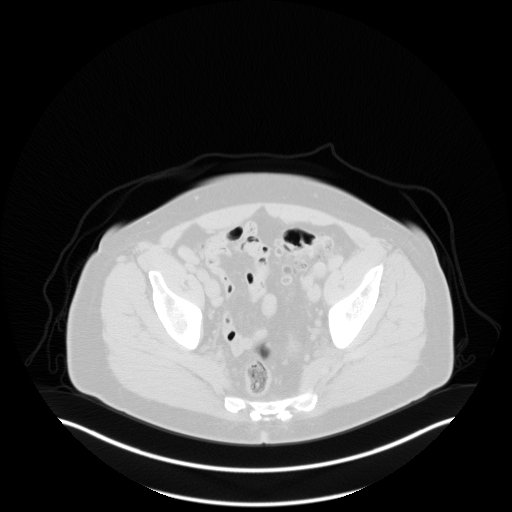
[im 122/243]
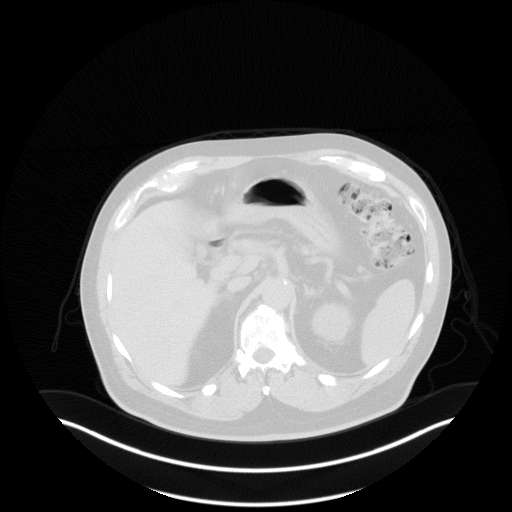
[im 182/243]
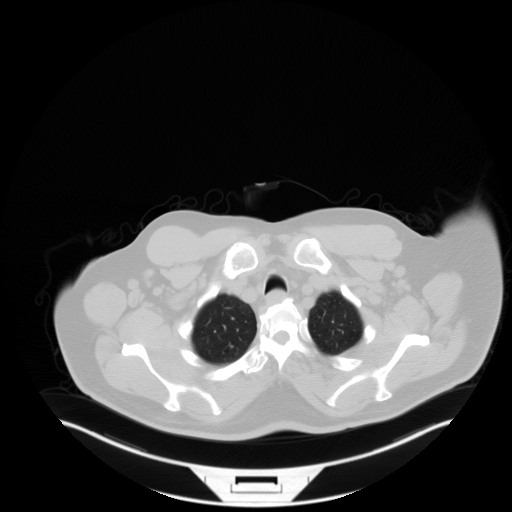
[im 243/243  brain]
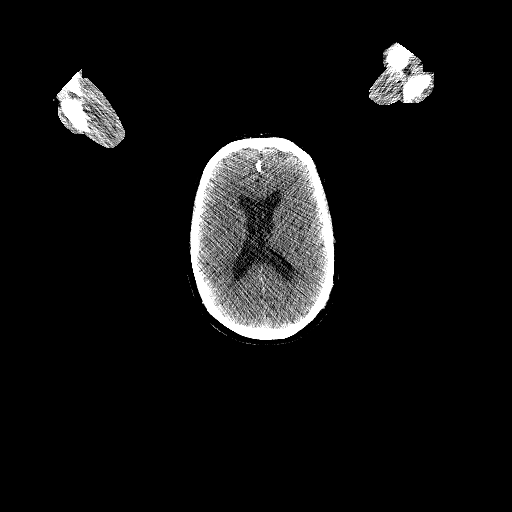

[Series 5: pet sk_thigh nac · axial · 5.0mm · 4.07mm/px · z∈[-1128,-160]mm · 5 of 243 slices shown]
[im 1/243]
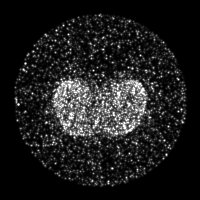
[im 61/243]
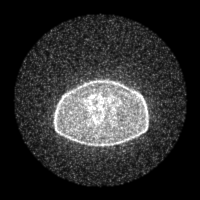
[im 122/243]
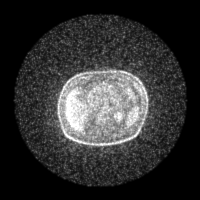
[im 182/243]
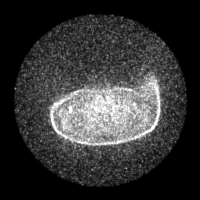
[im 243/243]
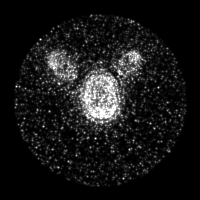

[Series 8: ct sk_thigh 5.0 b70f lung_bone · axial · 5.0mm · 0.79mm/px · z∈[-652,-352]mm · 2 of 76 slices shown]
[im 1/76  bone]
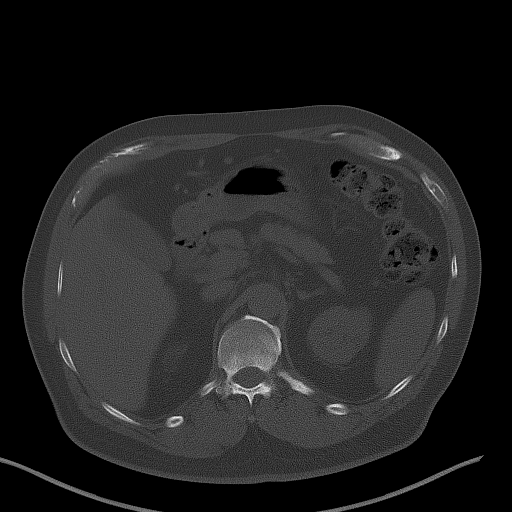
[im 76/76  bone]
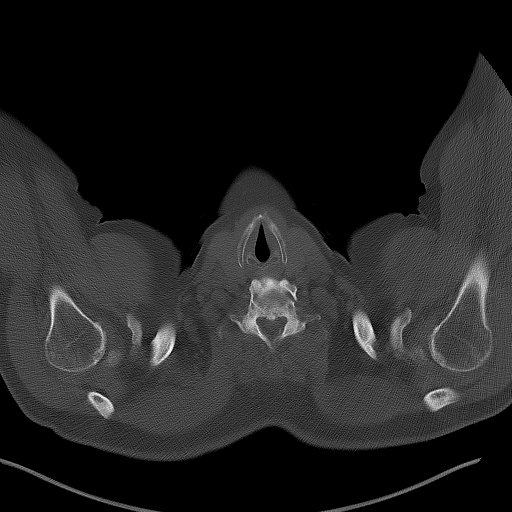

[Series 603: mip range 3 · coronal · 2.01mm/px · 1 of 32 slices shown]
[im 1/32]
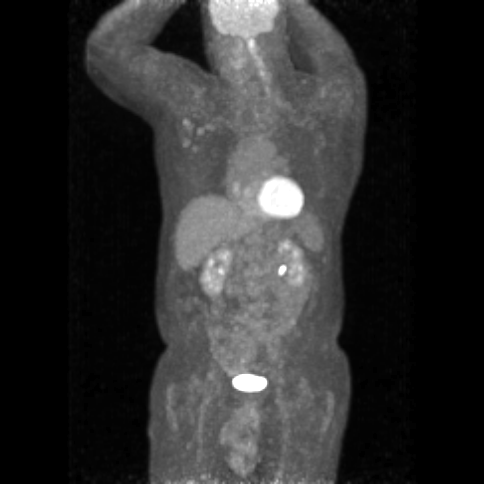

[Series 604: range-ct sk_thigh 5.0 (id)<alpha range> · 2 of 89 slices shown (1 of 2)]
[im 1/89]
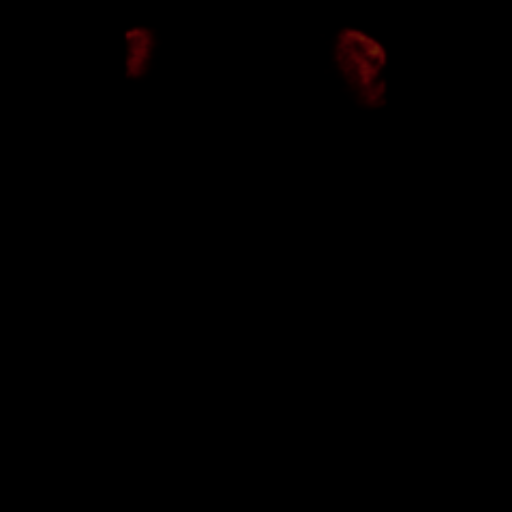
[im 89/89]
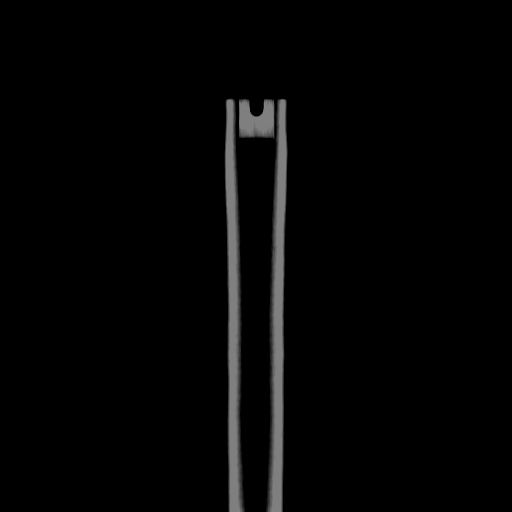

[Series 605: range-ct sk_thigh 5.0 (id)<alpha range> · 5 of 237 slices shown (2 of 2)]
[im 1/237]
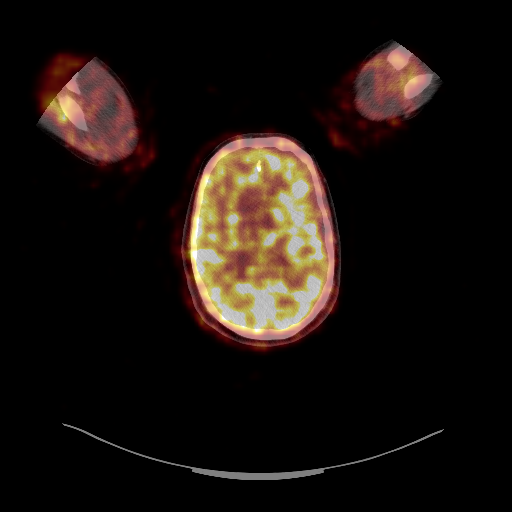
[im 60/237]
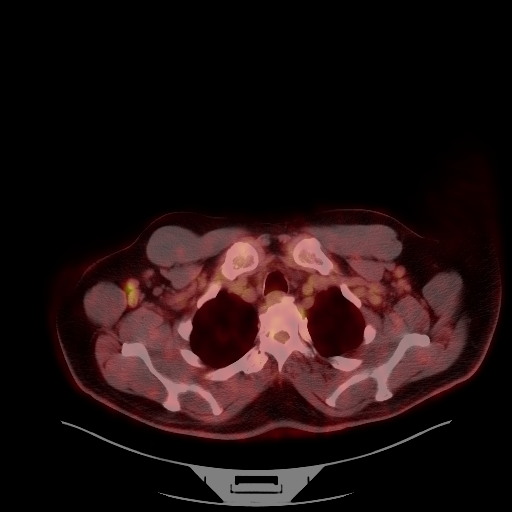
[im 119/237]
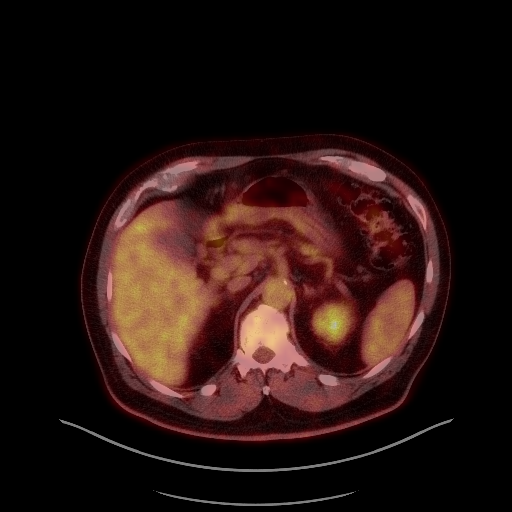
[im 178/237]
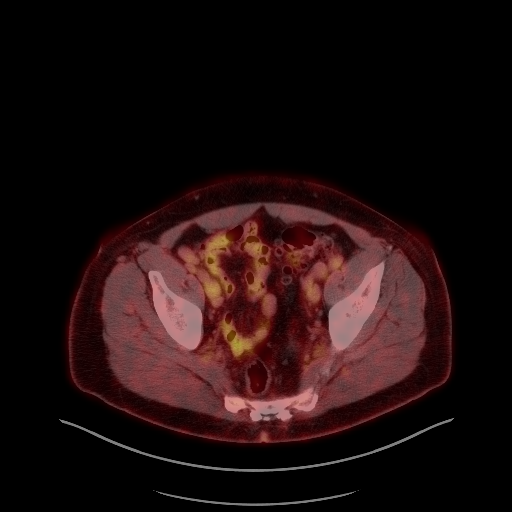
[im 237/237]
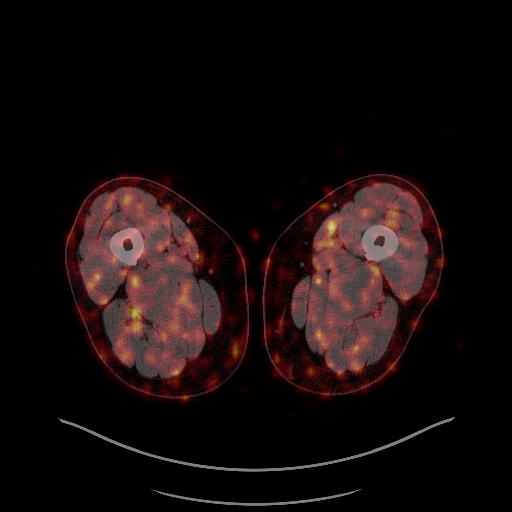

[25 of 25 positions shown; findings below may reference images not displayed]

FINDINGS: Mediastinal blood pool activity: SUV max

Liver activity: SUV max

NECK: Numerous small bilateral level 2 level 3 lymph nodes. Nodes
have mild metabolic activity similar to background blood pool (SUV
max equal 1.7). The RIGHT level 2 lymph node measuring 9 mm (image
47/4). Enlarged submental nodes measuring 1.3 cm on the LEFT and
RIGHT without associated metabolic activity.

Incidental CT findings: None

none

CHEST:

There is mild activity in LEFT axial lymph nodes. For example 11 mm
node in LEFT axilla with SUV max equal 1.7 = image 64/4. There is
increased activity in the RIGHT axilla nodes which is favored to
relate to COVID vaccination

No significantly enlarged mediastinal lymph nodes on the there
multiple small nodes present. None with activity above background.

Focus of atelectasis in the medial RIGHT lower lobe associated with
osteophytosis which is favored benign (image 95)

Incidental CT findings: none

ABDOMEN/PELVIS: Several small retroperitoneal lymph nodes without
significant metabolic activity. Larger LEFT common iliac lymph node
measures 13 mm short axis with SUV max equal 2.9. Enlarged LEFT
external iliac lymph node measures 15 mm short axis with SUV max
equal 3.1.

spleen is normal size and normal metabolic activity. No abnormal
activity in the liver.

Incidental CT findings: none

SKELETON:

Incidental CT findings: none
IMPRESSION: 1. Multiple mildly enlarged lymph nodes with mild to moderate
metabolic activity consistent with low-grade lymphoma (chronic
lymphocytic leukemia.
2. Mild activity in the submental and cervical lymph nodes with
metabolic activity less than blood pool ( [HOSPITAL] 2)
3. Bilateral hypermetabolic axillary lymph nodes also with activity
less than blood pool. Increased activity in the RIGHT axilla is
favored related to COVID vaccination.
4. More intense activity associated with LEFT external and internal
iliac lymph nodes. The largest most intense nodes are LEFT external
iliac nodes with metabolic activity similar to liver ( [HOSPITAL] 3).
5. Normal spleen and bone marrow.

## 2022-01-21 DIAGNOSIS — C9110 Chronic lymphocytic leukemia of B-cell type not having achieved remission: Secondary | ICD-10-CM | POA: Diagnosis not present

## 2022-01-21 DIAGNOSIS — I129 Hypertensive chronic kidney disease with stage 1 through stage 4 chronic kidney disease, or unspecified chronic kidney disease: Secondary | ICD-10-CM | POA: Diagnosis not present

## 2022-01-21 DIAGNOSIS — I10 Essential (primary) hypertension: Secondary | ICD-10-CM | POA: Diagnosis not present

## 2022-01-21 DIAGNOSIS — Z6829 Body mass index (BMI) 29.0-29.9, adult: Secondary | ICD-10-CM | POA: Diagnosis not present

## 2022-01-21 DIAGNOSIS — E1169 Type 2 diabetes mellitus with other specified complication: Secondary | ICD-10-CM | POA: Diagnosis not present

## 2022-01-21 DIAGNOSIS — E1122 Type 2 diabetes mellitus with diabetic chronic kidney disease: Secondary | ICD-10-CM | POA: Diagnosis not present

## 2022-01-21 DIAGNOSIS — N183 Chronic kidney disease, stage 3 unspecified: Secondary | ICD-10-CM | POA: Diagnosis not present

## 2022-01-21 DIAGNOSIS — Z0001 Encounter for general adult medical examination with abnormal findings: Secondary | ICD-10-CM | POA: Diagnosis not present

## 2022-01-21 DIAGNOSIS — D1723 Benign lipomatous neoplasm of skin and subcutaneous tissue of right leg: Secondary | ICD-10-CM | POA: Diagnosis not present

## 2022-01-21 DIAGNOSIS — F70 Mild intellectual disabilities: Secondary | ICD-10-CM | POA: Diagnosis not present

## 2022-01-21 DIAGNOSIS — K7030 Alcoholic cirrhosis of liver without ascites: Secondary | ICD-10-CM | POA: Diagnosis not present

## 2022-02-21 DIAGNOSIS — N183 Chronic kidney disease, stage 3 unspecified: Secondary | ICD-10-CM | POA: Diagnosis not present

## 2022-02-21 DIAGNOSIS — E1122 Type 2 diabetes mellitus with diabetic chronic kidney disease: Secondary | ICD-10-CM | POA: Diagnosis not present

## 2022-02-21 DIAGNOSIS — I129 Hypertensive chronic kidney disease with stage 1 through stage 4 chronic kidney disease, or unspecified chronic kidney disease: Secondary | ICD-10-CM | POA: Diagnosis not present

## 2022-03-03 ENCOUNTER — Inpatient Hospital Stay: Payer: Medicare PPO

## 2022-03-03 ENCOUNTER — Other Ambulatory Visit: Payer: Self-pay | Admitting: Hematology and Oncology

## 2022-03-03 ENCOUNTER — Inpatient Hospital Stay: Payer: Medicare PPO | Attending: Hematology and Oncology

## 2022-03-03 ENCOUNTER — Inpatient Hospital Stay (HOSPITAL_BASED_OUTPATIENT_CLINIC_OR_DEPARTMENT_OTHER): Payer: Medicare PPO | Admitting: Hematology and Oncology

## 2022-03-03 ENCOUNTER — Other Ambulatory Visit: Payer: Self-pay

## 2022-03-03 VITALS — BP 152/74 | HR 63 | Temp 97.6°F | Resp 18 | Ht 72.0 in | Wt 231.3 lb

## 2022-03-03 DIAGNOSIS — C9110 Chronic lymphocytic leukemia of B-cell type not having achieved remission: Secondary | ICD-10-CM | POA: Insufficient documentation

## 2022-03-03 DIAGNOSIS — Z79899 Other long term (current) drug therapy: Secondary | ICD-10-CM | POA: Diagnosis not present

## 2022-03-03 DIAGNOSIS — D696 Thrombocytopenia, unspecified: Secondary | ICD-10-CM

## 2022-03-03 DIAGNOSIS — M791 Myalgia, unspecified site: Secondary | ICD-10-CM | POA: Diagnosis not present

## 2022-03-03 LAB — CMP (CANCER CENTER ONLY)
ALT: 20 U/L (ref 0–44)
AST: 20 U/L (ref 15–41)
Albumin: 4.6 g/dL (ref 3.5–5.0)
Alkaline Phosphatase: 80 U/L (ref 38–126)
Anion gap: 5 (ref 5–15)
BUN: 21 mg/dL (ref 8–23)
CO2: 29 mmol/L (ref 22–32)
Calcium: 9.3 mg/dL (ref 8.9–10.3)
Chloride: 106 mmol/L (ref 98–111)
Creatinine: 1.25 mg/dL — ABNORMAL HIGH (ref 0.61–1.24)
GFR, Estimated: >60 mL/min (ref >60)
Glucose, Bld: 106 mg/dL — ABNORMAL HIGH (ref 70–99)
Potassium: 4.4 mmol/L (ref 3.5–5.1)
Sodium: 140 mmol/L (ref 135–145)
Total Bilirubin: 1 mg/dL (ref 0.3–1.2)
Total Protein: 7.5 g/dL (ref 6.5–8.1)

## 2022-03-03 LAB — CBC WITH DIFFERENTIAL (CANCER CENTER ONLY)
Abs Immature Granulocytes: 0.08 10*3/uL — ABNORMAL HIGH (ref 0.00–0.07)
Basophils Absolute: 0 10*3/uL (ref 0.0–0.1)
Basophils Relative: 0 %
Eosinophils Absolute: 0.1 10*3/uL (ref 0.0–0.5)
Eosinophils Relative: 1 %
HCT: 41.3 % (ref 39.0–52.0)
Hemoglobin: 13 g/dL (ref 13.0–17.0)
Immature Granulocytes: 0 %
Lymphocytes Relative: 67 %
Lymphs Abs: 14.1 10*3/uL — ABNORMAL HIGH (ref 0.7–4.0)
MCH: 26.6 pg (ref 26.0–34.0)
MCHC: 31.5 g/dL (ref 30.0–36.0)
MCV: 84.6 fL (ref 80.0–100.0)
Monocytes Absolute: 1.6 10*3/uL — ABNORMAL HIGH (ref 0.1–1.0)
Monocytes Relative: 8 %
Neutro Abs: 5 10*3/uL (ref 1.7–7.7)
Neutrophils Relative %: 24 %
Platelet Count: 124 10*3/uL — ABNORMAL LOW (ref 150–400)
RBC: 4.88 MIL/uL (ref 4.22–5.81)
RDW: 14.4 % (ref 11.5–15.5)
Smear Review: NORMAL
WBC Count: 20.8 10*3/uL — ABNORMAL HIGH (ref 4.0–10.5)
nRBC: 0 % (ref 0.0–0.2)

## 2022-03-03 LAB — LACTATE DEHYDROGENASE: LDH: 146 U/L (ref 98–192)

## 2022-03-03 NOTE — Progress Notes (Signed)
?[place] ?Telephone:(336) [phone]   [phone] ? ?PROGRESS NOTE ? ?Patient Care Team: ?[name], MD as PCP - General (Family Medicine) ?Orson Slick, MD as Consulting Physician (Hematology and Oncology) ? ?Hematological/Oncological History ?# CLL --diagnosis of chronic lymphoid leukemia made 11/04/2018 ?            (a) the cells are positive for CD 04/12/19/22/23, kappa restricted ?  ?(1) Rituximab weekly x 9 weeks beginning 11/23/2018 ?            (a) reaction to first dose (which was 100 mg only). ?  ?(2) on maintenance rituximab, first dose 03/07/2019, repeated every 3 months ?            (a) baseline PET scan obtained on 02/16/2020 was [place] 2 except for the left iliac nodes, with SUV max 3.1 ?            (b) rituximab decreased to every 6 months beginning October 2021 ?  ? ?Interval History:  ?Cody [name] 66 y.o. male with medical history significant for CLL previously on monotherapy rituximab who presents for a follow up visit. The patient's last visit was on 10/24/2021 with Dr. Jana Hakim. In the interim since the last visit he has not had any major changes in his health.  ? ?On exam today Cody [name] is accompanied by his sister.  He reports he has been well in the interim since his last visit.  He reports that he did have a lot of difficulty with his last rituximab injection and would not be opposed to discontinuing it today.  He reports he is not having any issues with fevers, chills, sweats, nausea, ming or diarrhea.  Overall he is at his baseline level of health.  He denies any lymphadenopathy or swelling in his abdomen.  He has no problems with early satiety and his weight has been stable.  A full 10 point ROS is listed below. ? ?MEDICAL HISTORY:  ?Past Medical History:  ?Diagnosis Date  ? Alcoholic cirrhosis ([place])   ? Cataract   ? Diabetes mellitus without complication ([place])   ? History of alcohol abuse   ? Hypercholesterolemia   ? ? ?SURGICAL  HISTORY: ?No past surgical history on file. ? ?SOCIAL HISTORY: ?Social History  ? ?Socioeconomic History  ? Marital status: Divorced  ?  Spouse name: Not on file  ? Number of children: Not on file  ? Years of education: Not on file  ? Highest education level: Not on file  ?Occupational History  ? Not on file  ?Tobacco Use  ? Smoking status: Never  ? Smokeless tobacco: Never  ?Substance and Sexual Activity  ? Alcohol use: Not Currently  ? Drug use: No  ? Sexual activity: Not on file  ?Other Topics Concern  ? Not on file  ?Social History Narrative  ? Not on file  ? ?Social Determinants of Health  ? ?Financial Resource Strain: Not on file  ?Food Insecurity: Not on file  ?Transportation Needs: Not on file  ?Physical Activity: Not on file  ?Stress: Not on file  ?Social Connections: Not on file  ?Intimate Partner Violence: Not on file  ? ? ?FAMILY HISTORY: ?Family History  ?Problem Relation Age of Onset  ? Alcohol abuse Father   ? Cirrhosis Father   ? Coronary artery disease Father   ? ? ?ALLERGIES:  is allergic to rituxan [rituximab]. ? ?MEDICATIONS:  ?Current Outpatient Medications  ?Medication Sig Dispense Refill  ?  allopurinol (ZYLOPRIM) 300 MG tablet TAKE 1 TABLET DAILY 90 tablet 3  ? amLODipine (NORVASC) 10 MG tablet Take 10 mg by mouth daily.    ? atorvastatin (LIPITOR) 10 MG tablet Take 10 mg by mouth daily.    ? B-D UF III MINI PEN NEEDLES 31G X 5 MM MISC     ? Blood Glucose Monitoring Suppl (ACCU-CHEK GUIDE) w/Device KIT     ? fexofenadine (ALLEGRA) 180 MG tablet Take 180 mg by mouth at bedtime.    ? hydrochlorothiazide (HYDRODIURIL) 25 MG tablet Take 25 mg by mouth daily.    ? linagliptin (TRADJENTA) 5 MG TABS tablet Take 5 mg by mouth daily.    ? metFORMIN (GLUCOPHAGE-XR) 500 MG 24 hr tablet Take by mouth.    ? ondansetron (ZOFRAN) 4 MG tablet TAKE 1 TABLET BY MOUTH EVERY 8 HOURS AS NEEDED FOR NAUSEA AND VOMITING 8 tablet 2  ? ONETOUCH DELICA LANCETS FINE MISC 3 (three) times daily.   5  ? telmisartan  (MICARDIS) 80 MG tablet Take 80 mg by mouth daily.    ? TOUJEO SOLOSTAR 300 UNIT/ML SOPN 25 Units. daily    ? ?No current facility-administered medications for this visit.  ? ? ?REVIEW OF SYSTEMS:   ?Constitutional: ( - ) fevers, ( - )  chills , ( - ) night sweats ?Eyes: ( - ) blurriness of vision, ( - ) double vision, ( - ) watery eyes ?Ears, nose, mouth, throat, and face: ( - ) mucositis, ( - ) sore throat ?Respiratory: ( - ) cough, ( - ) dyspnea, ( - ) wheezes ?Cardiovascular: ( - ) palpitation, ( - ) chest discomfort, ( - ) lower extremity swelling ?Gastrointestinal:  ( - ) nausea, ( - ) heartburn, ( - ) change in bowel habits ?Skin: ( - ) abnormal skin rashes ?Lymphatics: ( - ) new lymphadenopathy, ( - ) easy bruising ?Neurological: ( - ) numbness, ( - ) tingling, ( - ) new weaknesses ?Behavioral/Psych: ( - ) mood change, ( - ) new changes  ?All other systems were reviewed with the patient and are negative. ? ?PHYSICAL EXAMINATION: ?ECOG PERFORMANCE STATUS: 1 - Symptomatic but completely ambulatory ? ?Vitals:  ? 03/03/22 1129  ?BP: (!) 152/74  ?Pulse: 63  ?Resp: 18  ?Temp: 97.6 ?F (36.4 ?C)  ?SpO2: 100%  ? ?Filed Weights  ? 03/03/22 1129  ?Weight: 231 lb 5 oz (104.9 kg)  ? ? ?GENERAL: Well-appearing elderly male, alert, no distress and comfortable ?SKIN: skin color, texture, turgor are normal, no rashes or significant lesions ?EYES: conjunctiva are pink and non-injected, sclera clear ?NECK: supple, non-tender ?LYMPH:  no palpable lymphadenopathy in the cervical, axillary or inguinal ?LUNGS: clear to auscultation and percussion with normal breathing effort ?HEART: regular rate & rhythm and no murmurs and no lower extremity edema ?Musculoskeletal: no cyanosis of digits and no clubbing  ?PSYCH: alert & oriented x 3, fluent speech ?NEURO: no focal motor/sensory deficits ? ?LABORATORY DATA:  ?I have reviewed the data as listed ? ?  Latest Ref Rng & Units 03/03/2022  ? 11:11 AM 10/24/2021  ? 12:43 PM 09/02/2021  ?   9:50 AM  ?CBC  ?WBC 4.0 - 10.5 K/uL 20.8   9.8   13.9    ?Hemoglobin 13.0 - 17.0 g/dL 13.0   13.1   13.2    ?Hematocrit 39.0 - 52.0 % 41.3   41.4   40.8    ?Platelets 150 - 400 K/uL 124  116   114    ? ? ? ?  Latest Ref Rng & Units 03/03/2022  ? 11:11 AM 10/24/2021  ? 12:43 PM 09/02/2021  ?  9:50 AM  ?CMP  ?Glucose 70 - 99 mg/dL 106   285   283    ?BUN 8 - 23 mg/dL _0 ?Creatinine 0.61 - 1.24 mg/dL 1.25   1.42   1.66    ?Sodium 135 - 145 mmol/L 140   141   138    ?Potassium 3.5 - 5.1 mmol/L 4.4   4.6   4.3    ?Chloride 98 - 111 mmol/L 106   105   103    ?CO2 22 - 32 mmol/L _1 ?Calcium 8.9 - 10.3 mg/dL 9.3   9.0   9.4    ?Total Protein 6.5 - 8.1 g/dL 7.5   7.5   7.3    ?Total Bilirubin 0.3 - 1.2 mg/dL 1.0   0.9   1.0    ?Alkaline Phos 38 - 126 U/L 80   92   89    ?AST 15 - 41 U/L _2 ?ALT 0 - 44 U/L _3 ? ? ?RADIOGRAPHIC STUDIES: ?No results found. ? ?ASSESSMENT & PLAN ?Trudee Grip 66 y.o. male with medical history significant for CLL previously on monotherapy rituximab who presents for a follow up visit.  ? ?# CLL ?--patient underwent approximately 2 year of monotherapy rituximab followed by maintenance rituximab.  ?-- Discussed with the patient the risks and benefits of continuing with rituximab therapy.  He has been having difficulty with the medication and therefore I think it is reasonable discontinue at this time ?--In the event he was found to have progression of his CLL would recommend treatment with ibrutinib or acalabrutinib ?--Labs at next visit to include CBC, CMP, LDH, and CLL prognostic panel ?--Return to clinic in 3 months time for further evaluation ? ?No orders of the defined types were placed in this encounter. ? ? ?All questions were answered. The patient knows to call the clinic with any problems, questions or concerns. ? ?A total of more than 40 minutes were spent on this encounter with face-to-face time and non-face-to-face time,  including preparing to see the patient, ordering tests and/or medications, counseling the patient and coordination of care as outlined above.  ? ?Ledell Peoples, MD ?Department of Hematology/Oncology ?[facility]

## 2022-03-10 ENCOUNTER — Ambulatory Visit: Payer: Medicare PPO | Admitting: Podiatry

## 2022-03-10 ENCOUNTER — Encounter: Payer: Self-pay | Admitting: Podiatry

## 2022-03-10 DIAGNOSIS — E1151 Type 2 diabetes mellitus with diabetic peripheral angiopathy without gangrene: Secondary | ICD-10-CM | POA: Diagnosis not present

## 2022-03-10 DIAGNOSIS — M79674 Pain in right toe(s): Secondary | ICD-10-CM | POA: Diagnosis not present

## 2022-03-10 DIAGNOSIS — M79675 Pain in left toe(s): Secondary | ICD-10-CM | POA: Diagnosis not present

## 2022-03-10 DIAGNOSIS — B351 Tinea unguium: Secondary | ICD-10-CM | POA: Diagnosis not present

## 2022-03-10 NOTE — Progress Notes (Signed)
This patient presents to the office with chief complaint of long thick painful nails.  Patient says the nails are painful walking and wearing shoes.  This patient is unable to self treat.  This patient is unable to trim his nails since he is unable to reach her nails.  he presents to the office for preventative foot care services.    General Appearance  Alert, conversant and in no acute stress.  Vascular  Dorsalis pedis and posterior tibial  pulses are palpable  bilaterally.  Capillary return is within normal limits  bilaterally. Temperature is within normal limits  bilaterally.  Neurologic  Senn-Weinstein monofilament wire test within normal limits  bilaterally. Muscle power within normal limits bilaterally.  Nails Thick disfigured discolored nails with subungual debris  from hallux to fifth toes bilaterally. No evidence of bacterial infection or drainage bilaterally.   Orthopedic  No limitations of motion  feet .  No crepitus or effusions noted.  No bony pathology .  Syndactaly 2-3 left foot.  Skin  normotropic skin with no porokeratosis noted bilaterally.  No signs of infections or ulcers noted.     Onychomycosis  Nails  B/L.  Pain in right toes  Pain in left toes  Debridement of nails both feet followed trimming the nails with dremel tool.      RTC  3  months.   Eyan Hagood DPM  

## 2022-03-12 DIAGNOSIS — E119 Type 2 diabetes mellitus without complications: Secondary | ICD-10-CM | POA: Diagnosis not present

## 2022-03-22 DIAGNOSIS — D1723 Benign lipomatous neoplasm of skin and subcutaneous tissue of right leg: Secondary | ICD-10-CM | POA: Diagnosis not present

## 2022-03-22 DIAGNOSIS — N183 Chronic kidney disease, stage 3 unspecified: Secondary | ICD-10-CM | POA: Diagnosis not present

## 2022-03-22 DIAGNOSIS — C9191 Lymphoid leukemia, unspecified, in remission: Secondary | ICD-10-CM | POA: Diagnosis not present

## 2022-03-22 DIAGNOSIS — F70 Mild intellectual disabilities: Secondary | ICD-10-CM | POA: Diagnosis not present

## 2022-03-22 DIAGNOSIS — C9110 Chronic lymphocytic leukemia of B-cell type not having achieved remission: Secondary | ICD-10-CM | POA: Diagnosis not present

## 2022-03-22 DIAGNOSIS — N1831 Chronic kidney disease, stage 3a: Secondary | ICD-10-CM | POA: Diagnosis not present

## 2022-03-22 DIAGNOSIS — I10 Essential (primary) hypertension: Secondary | ICD-10-CM | POA: Diagnosis not present

## 2022-03-22 DIAGNOSIS — E1122 Type 2 diabetes mellitus with diabetic chronic kidney disease: Secondary | ICD-10-CM | POA: Diagnosis not present

## 2022-03-23 DIAGNOSIS — I129 Hypertensive chronic kidney disease with stage 1 through stage 4 chronic kidney disease, or unspecified chronic kidney disease: Secondary | ICD-10-CM | POA: Diagnosis not present

## 2022-03-23 DIAGNOSIS — E1122 Type 2 diabetes mellitus with diabetic chronic kidney disease: Secondary | ICD-10-CM | POA: Diagnosis not present

## 2022-04-23 DIAGNOSIS — E1122 Type 2 diabetes mellitus with diabetic chronic kidney disease: Secondary | ICD-10-CM | POA: Diagnosis not present

## 2022-04-23 DIAGNOSIS — I129 Hypertensive chronic kidney disease with stage 1 through stage 4 chronic kidney disease, or unspecified chronic kidney disease: Secondary | ICD-10-CM | POA: Diagnosis not present

## 2022-05-23 DIAGNOSIS — I129 Hypertensive chronic kidney disease with stage 1 through stage 4 chronic kidney disease, or unspecified chronic kidney disease: Secondary | ICD-10-CM | POA: Diagnosis not present

## 2022-05-23 DIAGNOSIS — E1122 Type 2 diabetes mellitus with diabetic chronic kidney disease: Secondary | ICD-10-CM | POA: Diagnosis not present

## 2022-06-01 ENCOUNTER — Other Ambulatory Visit: Payer: Self-pay | Admitting: Hematology and Oncology

## 2022-06-01 DIAGNOSIS — C9110 Chronic lymphocytic leukemia of B-cell type not having achieved remission: Secondary | ICD-10-CM

## 2022-06-02 ENCOUNTER — Other Ambulatory Visit: Payer: Self-pay

## 2022-06-02 ENCOUNTER — Inpatient Hospital Stay: Payer: Medicare PPO | Attending: Hematology and Oncology | Admitting: Hematology and Oncology

## 2022-06-02 ENCOUNTER — Inpatient Hospital Stay: Payer: Medicare PPO

## 2022-06-02 VITALS — BP 147/71 | HR 66 | Temp 97.5°F | Resp 16 | Ht 72.0 in | Wt 227.2 lb

## 2022-06-02 DIAGNOSIS — Z79899 Other long term (current) drug therapy: Secondary | ICD-10-CM | POA: Insufficient documentation

## 2022-06-02 DIAGNOSIS — C9110 Chronic lymphocytic leukemia of B-cell type not having achieved remission: Secondary | ICD-10-CM

## 2022-06-02 DIAGNOSIS — D696 Thrombocytopenia, unspecified: Secondary | ICD-10-CM | POA: Diagnosis not present

## 2022-06-02 LAB — CBC WITH DIFFERENTIAL (CANCER CENTER ONLY)
Abs Immature Granulocytes: 0.11 10*3/uL — ABNORMAL HIGH (ref 0.00–0.07)
Basophils Absolute: 0.1 10*3/uL (ref 0.0–0.1)
Basophils Relative: 0 %
Eosinophils Absolute: 0.2 10*3/uL (ref 0.0–0.5)
Eosinophils Relative: 1 %
HCT: 39.3 % (ref 39.0–52.0)
Hemoglobin: 12.5 g/dL — ABNORMAL LOW (ref 13.0–17.0)
Immature Granulocytes: 0 %
Lymphocytes Relative: 82 %
Lymphs Abs: 26.9 10*3/uL — ABNORMAL HIGH (ref 0.7–4.0)
MCH: 27 pg (ref 26.0–34.0)
MCHC: 31.8 g/dL (ref 30.0–36.0)
MCV: 84.9 fL (ref 80.0–100.0)
Monocytes Absolute: 1.3 10*3/uL — ABNORMAL HIGH (ref 0.1–1.0)
Monocytes Relative: 4 %
Neutro Abs: 4.1 10*3/uL (ref 1.7–7.7)
Neutrophils Relative %: 13 %
Platelet Count: 125 10*3/uL — ABNORMAL LOW (ref 150–400)
RBC: 4.63 MIL/uL (ref 4.22–5.81)
RDW: 15 % (ref 11.5–15.5)
WBC Count: 32.6 10*3/uL — ABNORMAL HIGH (ref 4.0–10.5)
nRBC: 0 % (ref 0.0–0.2)

## 2022-06-02 LAB — CMP (CANCER CENTER ONLY)
ALT: 24 U/L (ref 0–44)
AST: 22 U/L (ref 15–41)
Albumin: 4.4 g/dL (ref 3.5–5.0)
Alkaline Phosphatase: 75 U/L (ref 38–126)
Anion gap: 5 (ref 5–15)
BUN: 23 mg/dL (ref 8–23)
CO2: 29 mmol/L (ref 22–32)
Calcium: 9.1 mg/dL (ref 8.9–10.3)
Chloride: 105 mmol/L (ref 98–111)
Creatinine: 1.3 mg/dL — ABNORMAL HIGH (ref 0.61–1.24)
GFR, Estimated: >60 mL/min (ref >60)
Glucose, Bld: 115 mg/dL — ABNORMAL HIGH (ref 70–99)
Potassium: 4.4 mmol/L (ref 3.5–5.1)
Sodium: 139 mmol/L (ref 135–145)
Total Bilirubin: 0.9 mg/dL (ref 0.3–1.2)
Total Protein: 7.1 g/dL (ref 6.5–8.1)

## 2022-06-02 LAB — LACTATE DEHYDROGENASE: LDH: 148 U/L (ref 98–192)

## 2022-06-02 NOTE — Progress Notes (Signed)
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   (2) on maintenance rituximab, first dose 03/07/2019, repeated every 3 months             (a) baseline PET scan obtained on 02/16/2020 was [place] 2 except for the left iliac nodes, with SUV max 3.1             (b) rituximab decreased to every 6 months beginning October 2021    Interval History:  Cody [name] 66 y.o. male with medical history significant for CLL previously on monotherapy rituximab who presents for a follow up visit. The patient's last visit was on 03/03/2022. In the interim since the last visit he has not had any major changes in his health.   On exam today Mr. Parker is accompanied by his sister.  He reports his energy has been quite good in the interim since her last visit.  He reports his energy is an 8 or 9 out of 10.  He reports that he has been working in the yard.  He notes that he is eating well, though he reports that he has been feeling quite hot when outside.  He is not having any issues with bleeding or bruising.  He has no limitations in his day-to-day activity.  He denies any fevers, chills, sweats, nausea, ming or diarrhea.  Overall his health has been stable.  Overall he is at his baseline level of health.  He denies any lymphadenopathy or swelling in his abdomen.  He has no problems with early satiety and his weight has been stable.  A full 10 point ROS is listed below.  MEDICAL HISTORY:  Past Medical History:  Diagnosis  Date   Alcoholic cirrhosis ([place])    Cataract    Diabetes mellitus without complication ([place])    History of alcohol abuse    Hypercholesterolemia     SURGICAL HISTORY: No past surgical history on file.  SOCIAL HISTORY: Social History   Socioeconomic History   Marital status: Divorced    Spouse name: Not on file   Number of children: Not on file   Years of education: Not on file   Highest education level: Not on file  Occupational History   Not on file  Tobacco Use   Smoking status: Never   Smokeless tobacco: Never  Substance and Sexual Activity   Alcohol use: Not Currently   Drug use: No   Sexual activity: Not on file  Other Topics Concern   Not on file  Social History Narrative   Not on file   Social Determinants of Health   Financial Resource Strain: Not on file  Food Insecurity: Not on file  Transportation Needs: Not on file  Physical Activity: Not on file  Stress: Not on file  Social Connections: Not on file  Intimate Partner Violence: Not on file    FAMILY HISTORY: Family History  Problem Relation Age of Onset  Alcohol abuse Father    Cirrhosis Father    Coronary artery disease Father     ALLERGIES:  is allergic to rituxan [rituximab].  MEDICATIONS:  Current Outpatient Medications  Medication Sig Dispense Refill   allopurinol (ZYLOPRIM) 300 MG tablet TAKE 1 TABLET DAILY 90 tablet 3   amLODipine (NORVASC) 10 MG tablet Take 10 mg by mouth daily.     atorvastatin (LIPITOR) 10 MG tablet Take 10 mg by mouth daily.     B-D UF III MINI PEN NEEDLES 31G X 5 MM MISC      Blood Glucose Monitoring Suppl (ACCU-CHEK GUIDE) w/Device KIT      fexofenadine (ALLEGRA) 180 MG tablet Take 180 mg by mouth at bedtime.     hydrochlorothiazide (HYDRODIURIL) 25 MG tablet Take 25 mg by mouth daily.     linagliptin (TRADJENTA) 5 MG TABS tablet Take 5 mg by mouth daily.     metFORMIN (GLUCOPHAGE-XR) 500 MG 24 hr tablet Take by mouth.     ondansetron (ZOFRAN) 4 MG tablet  TAKE 1 TABLET BY MOUTH EVERY 8 HOURS AS NEEDED FOR NAUSEA AND VOMITING 8 tablet 2   ONETOUCH DELICA LANCETS FINE MISC 3 (three) times daily.   5   telmisartan (MICARDIS) 80 MG tablet Take 80 mg by mouth daily.     TOUJEO SOLOSTAR 300 UNIT/ML SOPN 25 Units. daily     No current facility-administered medications for this visit.    REVIEW OF SYSTEMS:   Constitutional: ( - ) fevers, ( - )  chills , ( - ) night sweats Eyes: ( - ) blurriness of vision, ( - ) double vision, ( - ) watery eyes Ears, nose, mouth, throat, and face: ( - ) mucositis, ( - ) sore throat Respiratory: ( - ) cough, ( - ) dyspnea, ( - ) wheezes Cardiovascular: ( - ) palpitation, ( - ) chest discomfort, ( - ) lower extremity swelling Gastrointestinal:  ( - ) nausea, ( - ) heartburn, ( - ) change in bowel habits Skin: ( - ) abnormal skin rashes Lymphatics: ( - ) new lymphadenopathy, ( - ) easy bruising Neurological: ( - ) numbness, ( - ) tingling, ( - ) new weaknesses Behavioral/Psych: ( - ) mood change, ( - ) new changes  All other systems were reviewed with the patient and are negative.  PHYSICAL EXAMINATION: ECOG PERFORMANCE STATUS: 1 - Symptomatic but completely ambulatory  Vitals:   06/02/22 1215  BP: (!) 147/71  Pulse: 66  Resp: 16  Temp: (!) 97.5 F (36.4 C)  SpO2: 98%    Filed Weights   06/02/22 1215  Weight: 227 lb 3.2 oz (103.1 kg)     GENERAL: Well-appearing elderly male, alert, no distress and comfortable SKIN: skin color, texture, turgor are normal, no rashes or significant lesions EYES: conjunctiva are pink and non-injected, sclera clear NECK: supple, non-tender LYMPH:  no palpable lymphadenopathy in the cervical, axillary or inguinal LUNGS: clear to auscultation and percussion with normal breathing effort HEART: regular rate & rhythm and no murmurs and no lower extremity edema Musculoskeletal: no cyanosis of digits and no clubbing  PSYCH: alert & oriented x 3, fluent speech NEURO: no focal  motor/sensory deficits  LABORATORY DATA:  I have reviewed the data as listed    Latest Ref Rng & Units 06/02/2022   10:54 AM 03/03/2022   11:11 AM 10/24/2021   12:43 PM  CBC  WBC 4.0 - 10.5 K/uL 32.6  20.8  9.8  Hemoglobin 13.0 - 17.0 g/dL 12.5  13.0  13.1   Hematocrit 39.0 - 52.0 % 39.3  41.3  41.4   Platelets 150 - 400 K/uL 125  124  116        Latest Ref Rng & Units 06/02/2022   10:54 AM 03/03/2022   11:11 AM 10/24/2021   12:43 PM  CMP  Glucose 70 - 99 mg/dL 115  106  285   BUN 8 - 23 mg/dL '23  21  18   ' Creatinine 0.61 - 1.24 mg/dL 1.30  1.25  1.42   Sodium 135 - 145 mmol/L 139  140  141   Potassium 3.5 - 5.1 mmol/L 4.4  4.4  4.6   Chloride 98 - 111 mmol/L 105  106  105   CO2 22 - 32 mmol/L '29  29  27   ' Calcium 8.9 - 10.3 mg/dL 9.1  9.3  9.0   Total Protein 6.5 - 8.1 g/dL 7.1  7.5  7.5   Total Bilirubin 0.3 - 1.2 mg/dL 0.9  1.0  0.9   Alkaline Phos 38 - 126 U/L 75  80  92   AST 15 - 41 U/L '22  20  18   ' ALT 0 - 44 U/L '24  20  20     ' RADIOGRAPHIC STUDIES: No results found.  ASSESSMENT & PLAN GABERIEL YOUNGBLOOD 66 y.o. male with medical history significant for CLL previously on monotherapy rituximab who presents for a follow up visit.   # CLL --patient underwent approximately 2 year of monotherapy rituximab followed by maintenance rituximab.  -- Discussed with the patient the risks and benefits of continuing with rituximab therapy.  He has been having difficulty with the medication and therefore I think it is reasonable discontinue at this time --In the event he was found to have progression of his CLL would recommend treatment with ibrutinib or acalabrutinib --Labs at next visit to include CBC, CMP, LDH, and CLL prognostic panel --Labs today show white blood cell count 32.6, hemoglobin 12.5, platelets 125, creatinine 1.30 --Return to clinic in 6 months time for further evaluation  Orders Placed This Encounter  Procedures   CBC with Differential ([facility] Only)     Standing Status:   Future    Standing Expiration Date:   06/03/2023   CMP ([facility] only)    Standing Status:   Future    Standing Expiration Date:   06/03/2023   Lactate dehydrogenase (LDH)    Standing Status:   Future    Standing Expiration Date:   06/02/2023   FISH, CLL Prognostic Panel    Standing Status:   Future    Standing Expiration Date:   06/03/2023    All questions were answered. The patient knows to call the clinic with any problems, questions or concerns.  A total of more than 30 minutes were spent on this encounter with face-to-face time and non-face-to-face time, including preparing to see the patient, ordering tests and/or medications, counseling the patient and coordination of care as outlined above.   Ledell Peoples, MD Department of Hematology/Oncology [place] at [facility] Phone: [phone] Pager: [phone] Email: [email or web address]  06/02/2022 1:00 PM  Kristian Covey BD, Catovsky D, Cal[place]aris-Cappio F, Dighiero G, Dhner H, [place], [place], [place] E, Chiorazzi N, [place], Rai KR, [place], Eichhorst B, O'Brien S, Robak T, Seymour JF, Kipps TJ. iwCLL guidelines for diagnosis, indications for treatment, response assessment, and supportive management  of CLL. Blood. 2018 Jun 21;131(25):2745-2760.  Active disease should be clearly documented to initiate therapy. At least 1 of the following criteria should be met.  1) Evidence of progressive marrow failure as manifested by the development of, or worsening of, anemia and/or thrombocytopenia. Cutoff levels of Hb <10 g/dL or platelet counts <100  109/L are generally regarded as indication for treatment. However, in some patients, platelet counts <100  109/L may remain stable over a long period; this situation does not automatically require therapeutic intervention. 2) Massive (ie, ?6 cm below the left costal margin) or progressive or symptomatic  splenomegaly. 3) Massive nodes (ie, ?10 cm in longest diameter) or progressive or symptomatic lymphadenopathy. 4) Progressive lymphocytosis with an increase of ?50% over a 52-monthperiod, or lymphocyte doubling time (LDT) <6 months. LDT can be obtained by linear regression extrapolation of absolute lymphocyte counts obtained at intervals of 2 weeks over an observation period of 2 to 3 months; patients with initial blood lymphocyte counts <30  109/L may require a longer observation period to determine the LDT. Factors contributing to lymphocytosis other than CLL (eg, infections, steroid administration) should be excluded. 5) Autoimmune complications including anemia or thrombocytopenia poorly responsive to corticosteroids. 6) Symptomatic or functional extranodal involvement (eg, skin, kidney, lung, spine). Disease-related symptoms as defined by any of the following: Unintentional weight loss ?10% within the previous 6 months. Significant fatigue (ie, ECOG performance scale 2 or worse; cannot work or unable to perform usual activities). Fevers ?100.18F or 38.0C for 2 or more weeks without evidence of infection. Night sweats for ?1 month without evidence of infection.

## 2022-06-05 ENCOUNTER — Ambulatory Visit: Payer: Medicare PPO | Admitting: Hematology and Oncology

## 2022-06-05 ENCOUNTER — Other Ambulatory Visit: Payer: Medicare PPO

## 2022-06-10 ENCOUNTER — Encounter: Payer: Self-pay | Admitting: Podiatry

## 2022-06-10 ENCOUNTER — Ambulatory Visit (INDEPENDENT_AMBULATORY_CARE_PROVIDER_SITE_OTHER): Payer: Medicare PPO | Admitting: Podiatry

## 2022-06-10 DIAGNOSIS — M79675 Pain in left toe(s): Secondary | ICD-10-CM

## 2022-06-10 DIAGNOSIS — E1151 Type 2 diabetes mellitus with diabetic peripheral angiopathy without gangrene: Secondary | ICD-10-CM | POA: Diagnosis not present

## 2022-06-10 DIAGNOSIS — M79674 Pain in right toe(s): Secondary | ICD-10-CM | POA: Diagnosis not present

## 2022-06-10 DIAGNOSIS — B351 Tinea unguium: Secondary | ICD-10-CM

## 2022-06-10 NOTE — Progress Notes (Signed)
This patient presents to the office with chief complaint of long thick painful nails.  Patient says the nails are painful walking and wearing shoes.  This patient is unable to self treat.  This patient is unable to trim his nails since he is unable to reach her nails.  he presents to the office for preventative foot care services.    General Appearance  Alert, conversant and in no acute stress.  Vascular  Dorsalis pedis and posterior tibial  pulses are palpable  bilaterally.  Capillary return is within normal limits  bilaterally. Temperature is within normal limits  bilaterally.  Neurologic  Senn-Weinstein monofilament wire test within normal limits  bilaterally. Muscle power within normal limits bilaterally.  Nails Thick disfigured discolored nails with subungual debris  from hallux to fifth toes bilaterally. No evidence of bacterial infection or drainage bilaterally.   Orthopedic  No limitations of motion  feet .  No crepitus or effusions noted.  No bony pathology .  Syndactaly 2-3 left foot.  Skin  normotropic skin with no porokeratosis noted bilaterally.  No signs of infections or ulcers noted.     Onychomycosis  Nails  B/L.  Pain in right toes  Pain in left toes  Debridement of nails both feet followed trimming the nails with dremel tool.      RTC  3  months.   Gardiner Barefoot DPM

## 2022-06-20 DIAGNOSIS — E785 Hyperlipidemia, unspecified: Secondary | ICD-10-CM | POA: Diagnosis not present

## 2022-06-20 DIAGNOSIS — N183 Chronic kidney disease, stage 3 unspecified: Secondary | ICD-10-CM | POA: Diagnosis not present

## 2022-06-20 DIAGNOSIS — D1723 Benign lipomatous neoplasm of skin and subcutaneous tissue of right leg: Secondary | ICD-10-CM | POA: Diagnosis not present

## 2022-06-20 DIAGNOSIS — Z6827 Body mass index (BMI) 27.0-27.9, adult: Secondary | ICD-10-CM | POA: Diagnosis not present

## 2022-06-20 DIAGNOSIS — I10 Essential (primary) hypertension: Secondary | ICD-10-CM | POA: Diagnosis not present

## 2022-06-20 DIAGNOSIS — F70 Mild intellectual disabilities: Secondary | ICD-10-CM | POA: Diagnosis not present

## 2022-06-20 DIAGNOSIS — Z125 Encounter for screening for malignant neoplasm of prostate: Secondary | ICD-10-CM | POA: Diagnosis not present

## 2022-06-20 DIAGNOSIS — N390 Urinary tract infection, site not specified: Secondary | ICD-10-CM | POA: Diagnosis not present

## 2022-06-20 DIAGNOSIS — E1169 Type 2 diabetes mellitus with other specified complication: Secondary | ICD-10-CM | POA: Diagnosis not present

## 2022-06-20 DIAGNOSIS — C9110 Chronic lymphocytic leukemia of B-cell type not having achieved remission: Secondary | ICD-10-CM | POA: Diagnosis not present

## 2022-06-20 DIAGNOSIS — D696 Thrombocytopenia, unspecified: Secondary | ICD-10-CM | POA: Diagnosis not present

## 2022-07-24 DIAGNOSIS — I129 Hypertensive chronic kidney disease with stage 1 through stage 4 chronic kidney disease, or unspecified chronic kidney disease: Secondary | ICD-10-CM | POA: Diagnosis not present

## 2022-07-24 DIAGNOSIS — E1122 Type 2 diabetes mellitus with diabetic chronic kidney disease: Secondary | ICD-10-CM | POA: Diagnosis not present

## 2022-07-24 DIAGNOSIS — N183 Chronic kidney disease, stage 3 unspecified: Secondary | ICD-10-CM | POA: Diagnosis not present

## 2022-08-23 DIAGNOSIS — E1122 Type 2 diabetes mellitus with diabetic chronic kidney disease: Secondary | ICD-10-CM | POA: Diagnosis not present

## 2022-08-23 DIAGNOSIS — N183 Chronic kidney disease, stage 3 unspecified: Secondary | ICD-10-CM | POA: Diagnosis not present

## 2022-08-23 DIAGNOSIS — I129 Hypertensive chronic kidney disease with stage 1 through stage 4 chronic kidney disease, or unspecified chronic kidney disease: Secondary | ICD-10-CM | POA: Diagnosis not present

## 2022-09-02 ENCOUNTER — Telehealth: Payer: Self-pay | Admitting: *Deleted

## 2022-09-02 ENCOUNTER — Inpatient Hospital Stay: Payer: Medicare PPO | Attending: Hematology and Oncology

## 2022-09-02 DIAGNOSIS — C9110 Chronic lymphocytic leukemia of B-cell type not having achieved remission: Secondary | ICD-10-CM | POA: Diagnosis not present

## 2022-09-02 LAB — CMP (CANCER CENTER ONLY)
ALT: 32 U/L (ref 0–44)
AST: 27 U/L (ref 15–41)
Albumin: 4.3 g/dL (ref 3.5–5.0)
Alkaline Phosphatase: 87 U/L (ref 38–126)
Anion gap: 5 (ref 5–15)
BUN: 22 mg/dL (ref 8–23)
CO2: 29 mmol/L (ref 22–32)
Calcium: 8.9 mg/dL (ref 8.9–10.3)
Chloride: 107 mmol/L (ref 98–111)
Creatinine: 1.2 mg/dL (ref 0.61–1.24)
GFR, Estimated: >60 mL/min (ref >60)
Glucose, Bld: 129 mg/dL — ABNORMAL HIGH (ref 70–99)
Potassium: 4.2 mmol/L (ref 3.5–5.1)
Sodium: 141 mmol/L (ref 135–145)
Total Bilirubin: 0.8 mg/dL (ref 0.3–1.2)
Total Protein: 7 g/dL (ref 6.5–8.1)

## 2022-09-02 LAB — CBC WITH DIFFERENTIAL (CANCER CENTER ONLY)
Abs Immature Granulocytes: 0.18 10*3/uL — ABNORMAL HIGH (ref 0.00–0.07)
Basophils Absolute: 0 10*3/uL (ref 0.0–0.1)
Basophils Relative: 0 %
Eosinophils Absolute: 0.4 10*3/uL (ref 0.0–0.5)
Eosinophils Relative: 1 %
HCT: 37.9 % — ABNORMAL LOW (ref 39.0–52.0)
Hemoglobin: 11.9 g/dL — ABNORMAL LOW (ref 13.0–17.0)
Immature Granulocytes: 0 %
Lymphocytes Relative: 87 %
Lymphs Abs: 49.2 10*3/uL — ABNORMAL HIGH (ref 0.7–4.0)
MCH: 27.2 pg (ref 26.0–34.0)
MCHC: 31.4 g/dL (ref 30.0–36.0)
MCV: 86.5 fL (ref 80.0–100.0)
Monocytes Absolute: 1.6 10*3/uL — ABNORMAL HIGH (ref 0.1–1.0)
Monocytes Relative: 3 %
Neutro Abs: 5 10*3/uL (ref 1.7–7.7)
Neutrophils Relative %: 9 %
Platelet Count: 130 10*3/uL — ABNORMAL LOW (ref 150–400)
RBC: 4.38 MIL/uL (ref 4.22–5.81)
RDW: 14.9 % (ref 11.5–15.5)
WBC Count: 56.3 10*3/uL (ref 4.0–10.5)
nRBC: 0 % (ref 0.0–0.2)

## 2022-09-02 LAB — LACTATE DEHYDROGENASE: LDH: 179 U/L (ref 98–192)

## 2022-09-02 NOTE — Telephone Encounter (Signed)
CRITICAL VALUE STICKER  CRITICAL VALUE: WBC 56.3  RECEIVER (on-site recipient of call): Georgina Pillion, [place] NOTIFIED: 09/02/22; [phone]  MESSENGER (representative from lab):Anette Guarneri  MD NOTIFIED: Dr. Lorenso Courier  TIME OF NOTIFICATION:0935  RESPONSE: Information acknowledged

## 2022-09-08 LAB — FISH,CLL PROGNOSTIC PANEL

## 2022-09-16 ENCOUNTER — Encounter: Payer: Self-pay | Admitting: Podiatry

## 2022-09-16 ENCOUNTER — Ambulatory Visit (INDEPENDENT_AMBULATORY_CARE_PROVIDER_SITE_OTHER): Payer: Medicare PPO | Admitting: Podiatry

## 2022-09-16 DIAGNOSIS — B351 Tinea unguium: Secondary | ICD-10-CM | POA: Diagnosis not present

## 2022-09-16 DIAGNOSIS — M79674 Pain in right toe(s): Secondary | ICD-10-CM

## 2022-09-16 DIAGNOSIS — E1151 Type 2 diabetes mellitus with diabetic peripheral angiopathy without gangrene: Secondary | ICD-10-CM | POA: Diagnosis not present

## 2022-09-16 DIAGNOSIS — M79675 Pain in left toe(s): Secondary | ICD-10-CM

## 2022-09-16 NOTE — Progress Notes (Signed)
This patient presents to the office with chief complaint of long thick painful nails.  Patient says the nails are painful walking and wearing shoes.  This patient is unable to self treat.  This patient is unable to trim his nails since he is unable to reach her nails.  he presents to the office for preventative foot care services.    General Appearance  Alert, conversant and in no acute stress.  Vascular  Dorsalis pedis and posterior tibial  pulses are palpable  bilaterally.  Capillary return is within normal limits  bilaterally. Temperature is within normal limits  bilaterally.  Neurologic  Senn-Weinstein monofilament wire test within normal limits  bilaterally. Muscle power within normal limits bilaterally.  Nails Thick disfigured discolored nails with subungual debris  from hallux to fifth toes bilaterally. No evidence of bacterial infection or drainage bilaterally.   Orthopedic  No limitations of motion  feet .  No crepitus or effusions noted.  No bony pathology .  Syndactaly 2-3 left foot.  Skin  normotropic skin with no porokeratosis noted bilaterally.  No signs of infections or ulcers noted.     Onychomycosis  Nails  B/L.  Pain in right toes  Pain in left toes  Debridement of nails both feet followed trimming the nails with dremel tool.      RTC  3  months.   Gardiner Barefoot DPM

## 2022-09-22 DIAGNOSIS — I10 Essential (primary) hypertension: Secondary | ICD-10-CM | POA: Diagnosis not present

## 2022-09-22 DIAGNOSIS — E785 Hyperlipidemia, unspecified: Secondary | ICD-10-CM | POA: Diagnosis not present

## 2022-09-22 DIAGNOSIS — F70 Mild intellectual disabilities: Secondary | ICD-10-CM | POA: Diagnosis not present

## 2022-09-22 DIAGNOSIS — E1169 Type 2 diabetes mellitus with other specified complication: Secondary | ICD-10-CM | POA: Diagnosis not present

## 2022-09-22 DIAGNOSIS — D1723 Benign lipomatous neoplasm of skin and subcutaneous tissue of right leg: Secondary | ICD-10-CM | POA: Diagnosis not present

## 2022-09-22 DIAGNOSIS — E709 Disorder of aromatic amino-acid metabolism, unspecified: Secondary | ICD-10-CM | POA: Diagnosis not present

## 2022-09-22 DIAGNOSIS — C9110 Chronic lymphocytic leukemia of B-cell type not having achieved remission: Secondary | ICD-10-CM | POA: Diagnosis not present

## 2022-09-23 DIAGNOSIS — N183 Chronic kidney disease, stage 3 unspecified: Secondary | ICD-10-CM | POA: Diagnosis not present

## 2022-09-23 DIAGNOSIS — E1122 Type 2 diabetes mellitus with diabetic chronic kidney disease: Secondary | ICD-10-CM | POA: Diagnosis not present

## 2022-09-23 DIAGNOSIS — I129 Hypertensive chronic kidney disease with stage 1 through stage 4 chronic kidney disease, or unspecified chronic kidney disease: Secondary | ICD-10-CM | POA: Diagnosis not present

## 2022-11-22 DIAGNOSIS — E1122 Type 2 diabetes mellitus with diabetic chronic kidney disease: Secondary | ICD-10-CM | POA: Diagnosis not present

## 2022-11-22 DIAGNOSIS — N183 Chronic kidney disease, stage 3 unspecified: Secondary | ICD-10-CM | POA: Diagnosis not present

## 2022-11-22 DIAGNOSIS — I129 Hypertensive chronic kidney disease with stage 1 through stage 4 chronic kidney disease, or unspecified chronic kidney disease: Secondary | ICD-10-CM | POA: Diagnosis not present

## 2022-11-27 DIAGNOSIS — J019 Acute sinusitis, unspecified: Secondary | ICD-10-CM | POA: Diagnosis not present

## 2022-11-27 DIAGNOSIS — J180 Bronchopneumonia, unspecified organism: Secondary | ICD-10-CM | POA: Diagnosis not present

## 2022-12-01 DIAGNOSIS — E1122 Type 2 diabetes mellitus with diabetic chronic kidney disease: Secondary | ICD-10-CM | POA: Diagnosis not present

## 2022-12-01 DIAGNOSIS — J189 Pneumonia, unspecified organism: Secondary | ICD-10-CM | POA: Diagnosis not present

## 2022-12-01 DIAGNOSIS — I129 Hypertensive chronic kidney disease with stage 1 through stage 4 chronic kidney disease, or unspecified chronic kidney disease: Secondary | ICD-10-CM | POA: Diagnosis not present

## 2022-12-01 DIAGNOSIS — Z6825 Body mass index (BMI) 25.0-25.9, adult: Secondary | ICD-10-CM | POA: Diagnosis not present

## 2022-12-01 DIAGNOSIS — E785 Hyperlipidemia, unspecified: Secondary | ICD-10-CM | POA: Diagnosis not present

## 2022-12-01 DIAGNOSIS — C9110 Chronic lymphocytic leukemia of B-cell type not having achieved remission: Secondary | ICD-10-CM | POA: Diagnosis not present

## 2022-12-01 DIAGNOSIS — F70 Mild intellectual disabilities: Secondary | ICD-10-CM | POA: Diagnosis not present

## 2022-12-01 DIAGNOSIS — N182 Chronic kidney disease, stage 2 (mild): Secondary | ICD-10-CM | POA: Diagnosis not present

## 2022-12-04 ENCOUNTER — Inpatient Hospital Stay (HOSPITAL_COMMUNITY)
Admission: EM | Admit: 2022-12-04 | Discharge: 2022-12-12 | DRG: 193 | Disposition: A | Discharge status: Skilled Nursing Facility | Payer: Medicare PPO | Attending: Family Medicine | Admitting: Family Medicine

## 2022-12-04 ENCOUNTER — Emergency Department (HOSPITAL_COMMUNITY): Payer: Medicare PPO

## 2022-12-04 ENCOUNTER — Encounter (HOSPITAL_COMMUNITY): Payer: Self-pay | Admitting: Internal Medicine

## 2022-12-04 ENCOUNTER — Inpatient Hospital Stay: Payer: Medicare PPO | Admitting: Hematology and Oncology

## 2022-12-04 ENCOUNTER — Inpatient Hospital Stay: Payer: Medicare PPO

## 2022-12-04 DIAGNOSIS — R6520 Severe sepsis without septic shock: Secondary | ICD-10-CM | POA: Diagnosis not present

## 2022-12-04 DIAGNOSIS — E7800 Pure hypercholesterolemia, unspecified: Secondary | ICD-10-CM | POA: Diagnosis present

## 2022-12-04 DIAGNOSIS — R531 Weakness: Secondary | ICD-10-CM | POA: Diagnosis not present

## 2022-12-04 DIAGNOSIS — R0902 Hypoxemia: Secondary | ICD-10-CM | POA: Diagnosis present

## 2022-12-04 DIAGNOSIS — Z7984 Long term (current) use of oral hypoglycemic drugs: Secondary | ICD-10-CM | POA: Diagnosis not present

## 2022-12-04 DIAGNOSIS — M109 Gout, unspecified: Secondary | ICD-10-CM | POA: Diagnosis present

## 2022-12-04 DIAGNOSIS — Z1152 Encounter for screening for COVID-19: Secondary | ICD-10-CM

## 2022-12-04 DIAGNOSIS — M6281 Muscle weakness (generalized): Secondary | ICD-10-CM | POA: Diagnosis not present

## 2022-12-04 DIAGNOSIS — Z811 Family history of alcohol abuse and dependence: Secondary | ICD-10-CM | POA: Diagnosis not present

## 2022-12-04 DIAGNOSIS — E875 Hyperkalemia: Secondary | ICD-10-CM | POA: Diagnosis not present

## 2022-12-04 DIAGNOSIS — F10139 Alcohol abuse with withdrawal, unspecified: Secondary | ICD-10-CM | POA: Diagnosis present

## 2022-12-04 DIAGNOSIS — E1369 Other specified diabetes mellitus with other specified complication: Secondary | ICD-10-CM | POA: Diagnosis not present

## 2022-12-04 DIAGNOSIS — N179 Acute kidney failure, unspecified: Secondary | ICD-10-CM | POA: Diagnosis present

## 2022-12-04 DIAGNOSIS — D638 Anemia in other chronic diseases classified elsewhere: Secondary | ICD-10-CM | POA: Diagnosis present

## 2022-12-04 DIAGNOSIS — K7030 Alcoholic cirrhosis of liver without ascites: Secondary | ICD-10-CM | POA: Diagnosis present

## 2022-12-04 DIAGNOSIS — Y95 Nosocomial condition: Secondary | ICD-10-CM | POA: Diagnosis present

## 2022-12-04 DIAGNOSIS — J189 Pneumonia, unspecified organism: Secondary | ICD-10-CM | POA: Diagnosis not present

## 2022-12-04 DIAGNOSIS — I10 Essential (primary) hypertension: Secondary | ICD-10-CM | POA: Diagnosis not present

## 2022-12-04 DIAGNOSIS — R000 Tachycardia, unspecified: Secondary | ICD-10-CM | POA: Diagnosis not present

## 2022-12-04 DIAGNOSIS — R1312 Dysphagia, oropharyngeal phase: Secondary | ICD-10-CM | POA: Diagnosis not present

## 2022-12-04 DIAGNOSIS — E139 Other specified diabetes mellitus without complications: Secondary | ICD-10-CM | POA: Diagnosis present

## 2022-12-04 DIAGNOSIS — R590 Localized enlarged lymph nodes: Secondary | ICD-10-CM | POA: Diagnosis not present

## 2022-12-04 DIAGNOSIS — E440 Moderate protein-calorie malnutrition: Secondary | ICD-10-CM | POA: Insufficient documentation

## 2022-12-04 DIAGNOSIS — G928 Other toxic encephalopathy: Secondary | ICD-10-CM | POA: Diagnosis not present

## 2022-12-04 DIAGNOSIS — G9341 Metabolic encephalopathy: Secondary | ICD-10-CM | POA: Diagnosis not present

## 2022-12-04 DIAGNOSIS — A419 Sepsis, unspecified organism: Secondary | ICD-10-CM | POA: Diagnosis not present

## 2022-12-04 DIAGNOSIS — E860 Dehydration: Secondary | ICD-10-CM | POA: Diagnosis present

## 2022-12-04 DIAGNOSIS — E870 Hyperosmolality and hypernatremia: Secondary | ICD-10-CM | POA: Diagnosis not present

## 2022-12-04 DIAGNOSIS — Z794 Long term (current) use of insulin: Secondary | ICD-10-CM

## 2022-12-04 DIAGNOSIS — Z79899 Other long term (current) drug therapy: Secondary | ICD-10-CM | POA: Diagnosis not present

## 2022-12-04 DIAGNOSIS — C9110 Chronic lymphocytic leukemia of B-cell type not having achieved remission: Secondary | ICD-10-CM

## 2022-12-04 DIAGNOSIS — Z8249 Family history of ischemic heart disease and other diseases of the circulatory system: Secondary | ICD-10-CM

## 2022-12-04 DIAGNOSIS — T361X5A Adverse effect of cephalosporins and other beta-lactam antibiotics, initial encounter: Secondary | ICD-10-CM | POA: Diagnosis present

## 2022-12-04 DIAGNOSIS — R4182 Altered mental status, unspecified: Secondary | ICD-10-CM | POA: Diagnosis present

## 2022-12-04 DIAGNOSIS — Z7401 Bed confinement status: Secondary | ICD-10-CM | POA: Diagnosis not present

## 2022-12-04 LAB — CBC WITH DIFFERENTIAL/PLATELET
Abs Immature Granulocytes: 0.46 10*3/uL — ABNORMAL HIGH (ref 0.00–0.07)
Basophils Absolute: 0.1 10*3/uL (ref 0.0–0.1)
Basophils Relative: 0 %
Eosinophils Absolute: 0.2 10*3/uL (ref 0.0–0.5)
Eosinophils Relative: 0 %
HCT: 39.9 % (ref 39.0–52.0)
Hemoglobin: 11.4 g/dL — ABNORMAL LOW (ref 13.0–17.0)
Immature Granulocytes: 0 %
Lymphocytes Relative: 93 %
Lymphs Abs: 128 10*3/uL — ABNORMAL HIGH (ref 0.7–4.0)
MCH: 26.1 pg (ref 26.0–34.0)
MCHC: 28.6 g/dL — ABNORMAL LOW (ref 30.0–36.0)
MCV: 91.5 fL (ref 80.0–100.0)
Monocytes Absolute: 2.4 10*3/uL — ABNORMAL HIGH (ref 0.1–1.0)
Monocytes Relative: 2 %
Neutro Abs: 7.5 10*3/uL (ref 1.7–7.7)
Neutrophils Relative %: 5 %
Platelets: 177 10*3/uL (ref 150–400)
RBC: 4.36 MIL/uL (ref 4.22–5.81)
RDW: 15.4 % (ref 11.5–15.5)
WBC Morphology: ABNORMAL
WBC: 138.7 10*3/uL (ref 4.0–10.5)
nRBC: 0 % (ref 0.0–0.2)

## 2022-12-04 LAB — COMPREHENSIVE METABOLIC PANEL
ALT: 34 U/L (ref 0–44)
AST: 42 U/L — ABNORMAL HIGH (ref 15–41)
Albumin: 3.7 g/dL (ref 3.5–5.0)
Alkaline Phosphatase: 63 U/L (ref 38–126)
Anion gap: 9 (ref 5–15)
BUN: 53 mg/dL — ABNORMAL HIGH (ref 8–23)
CO2: 22 mmol/L (ref 22–32)
Calcium: 8.5 mg/dL — ABNORMAL LOW (ref 8.9–10.3)
Chloride: 114 mmol/L — ABNORMAL HIGH (ref 98–111)
Creatinine, Ser: 2.38 mg/dL — ABNORMAL HIGH (ref 0.61–1.24)
GFR, Estimated: 29 mL/min — ABNORMAL LOW (ref >60)
Glucose, Bld: 107 mg/dL — ABNORMAL HIGH (ref 70–99)
Potassium: 5.2 mmol/L — ABNORMAL HIGH (ref 3.5–5.1)
Sodium: 145 mmol/L (ref 135–145)
Total Bilirubin: 0.7 mg/dL (ref 0.3–1.2)
Total Protein: 7.1 g/dL (ref 6.5–8.1)

## 2022-12-04 LAB — CBG MONITORING, ED
Glucose-Capillary: 108 mg/dL — ABNORMAL HIGH (ref 70–99)
Glucose-Capillary: 88 mg/dL (ref 70–99)
Glucose-Capillary: 103 mg/dL — ABNORMAL HIGH (ref 70–99)

## 2022-12-04 LAB — URINALYSIS, ROUTINE W REFLEX MICROSCOPIC
Bilirubin Urine: NEGATIVE
Glucose, UA: NEGATIVE mg/dL
Ketones, ur: NEGATIVE mg/dL
Leukocytes,Ua: NEGATIVE
Nitrite: NEGATIVE
Protein, ur: 30 mg/dL — AB
Specific Gravity, Urine: 1.013 (ref 1.005–1.030)
pH: 5 (ref 5.0–8.0)

## 2022-12-04 LAB — PROTIME-INR
INR: 1.2 (ref 0.8–1.2)
INR: 1.3 — ABNORMAL HIGH (ref 0.8–1.2)
Prothrombin Time: 14.8 seconds (ref 11.4–15.2)
Prothrombin Time: 15.7 seconds — ABNORMAL HIGH (ref 11.4–15.2)

## 2022-12-04 LAB — LACTIC ACID, PLASMA
Lactic Acid, Venous: 0.9 mmol/L (ref 0.5–1.9)
Lactic Acid, Venous: 0.6 mmol/L (ref 0.5–1.9)

## 2022-12-04 LAB — APTT
aPTT: 28 seconds (ref 24–36)
aPTT: 29 seconds (ref 24–36)

## 2022-12-04 LAB — GLUCOSE, CAPILLARY: Glucose-Capillary: 114 mg/dL — ABNORMAL HIGH (ref 70–99)

## 2022-12-04 LAB — RESP PANEL BY RT-PCR (RSV, FLU A&B, COVID)  RVPGX2
Influenza A by PCR: NEGATIVE
Influenza B by PCR: NEGATIVE
Resp Syncytial Virus by PCR: NEGATIVE
SARS Coronavirus 2 by RT PCR: NEGATIVE

## 2022-12-04 LAB — MRSA NEXT GEN BY PCR, NASAL: MRSA by PCR Next Gen: NOT DETECTED

## 2022-12-04 MED ORDER — LACTATED RINGERS IV BOLUS (SEPSIS)
1000.0000 mL | Freq: Once | INTRAVENOUS | Status: AC
  Administered 2022-12-04: 1000 mL via INTRAVENOUS

## 2022-12-04 MED ORDER — ACETAMINOPHEN 650 MG RE SUPP
650.0000 mg | Freq: Four times a day (QID) | RECTAL | Status: DC | PRN
  Administered 2022-12-07: 650 mg via RECTAL
  Filled 2022-12-04: qty 1

## 2022-12-04 MED ORDER — ONDANSETRON HCL 4 MG PO TABS: 4.0000 mg | ORAL_TABLET | Freq: Four times a day (QID) | ORAL | Status: DC | PRN

## 2022-12-04 MED ORDER — VANCOMYCIN HCL 1250 MG/250ML IV SOLN: 1250.0000 mg | INTRAVENOUS | Status: DC

## 2022-12-04 MED ORDER — ALLOPURINOL 300 MG PO TABS
300.0000 mg | ORAL_TABLET | Freq: Every day | ORAL | Status: DC
  Administered 2022-12-05 – 2022-12-12 (×8): 300 mg via ORAL
  Filled 2022-12-04 (×8): qty 1

## 2022-12-04 MED ORDER — VANCOMYCIN HCL IN DEXTROSE 1-5 GM/200ML-% IV SOLN: 1000.0000 mg | Freq: Once | INTRAVENOUS | Status: DC

## 2022-12-04 MED ORDER — ACETAMINOPHEN 325 MG PO TABS
650.0000 mg | ORAL_TABLET | Freq: Four times a day (QID) | ORAL | Status: DC | PRN
  Administered 2022-12-10: 650 mg via ORAL
  Filled 2022-12-04 (×2): qty 2

## 2022-12-04 MED ORDER — ATORVASTATIN CALCIUM 10 MG PO TABS
10.0000 mg | ORAL_TABLET | Freq: Every day | ORAL | Status: DC
  Administered 2022-12-05 – 2022-12-12 (×8): 10 mg via ORAL
  Filled 2022-12-04 (×8): qty 1

## 2022-12-04 MED ORDER — AMLODIPINE BESYLATE 10 MG PO TABS
10.0000 mg | ORAL_TABLET | Freq: Every day | ORAL | Status: DC
  Administered 2022-12-05 – 2022-12-12 (×7): 10 mg via ORAL
  Filled 2022-12-04 (×7): qty 1

## 2022-12-04 MED ORDER — ACETAMINOPHEN 500 MG PO TABS
1000.0000 mg | ORAL_TABLET | Freq: Once | ORAL | Status: AC
  Administered 2022-12-04: 1000 mg via ORAL
  Filled 2022-12-04: qty 2

## 2022-12-04 MED ORDER — ENOXAPARIN SODIUM 40 MG/0.4ML IJ SOSY
40.0000 mg | PREFILLED_SYRINGE | Freq: Every day | INTRAMUSCULAR | Status: DC
  Administered 2022-12-04 – 2022-12-12 (×8): 40 mg via SUBCUTANEOUS
  Filled 2022-12-04 (×9): qty 0.4

## 2022-12-04 MED ORDER — ALBUTEROL SULFATE (2.5 MG/3ML) 0.083% IN NEBU
2.5000 mg | INHALATION_SOLUTION | Freq: Four times a day (QID) | RESPIRATORY_TRACT | Status: DC
  Administered 2022-12-04 – 2022-12-05 (×6): 2.5 mg via RESPIRATORY_TRACT
  Filled 2022-12-04 (×6): qty 3

## 2022-12-04 MED ORDER — METHYLPREDNISOLONE SODIUM SUCC 40 MG IJ SOLR
40.0000 mg | Freq: Once | INTRAMUSCULAR | Status: AC
  Administered 2022-12-04: 40 mg via INTRAVENOUS
  Filled 2022-12-04: qty 1

## 2022-12-04 MED ORDER — INSULIN ASPART 100 UNIT/ML IJ SOLN
0.0000 [IU] | Freq: Three times a day (TID) | INTRAMUSCULAR | Status: DC
  Administered 2022-12-05: 2 [IU] via SUBCUTANEOUS
  Administered 2022-12-05: 1 [IU] via SUBCUTANEOUS
  Administered 2022-12-05: 2 [IU] via SUBCUTANEOUS
  Administered 2022-12-06 (×3): 1 [IU] via SUBCUTANEOUS
  Administered 2022-12-07 – 2022-12-09 (×4): 2 [IU] via SUBCUTANEOUS
  Administered 2022-12-09: 3 [IU] via SUBCUTANEOUS
  Administered 2022-12-10 – 2022-12-11 (×3): 1 [IU] via SUBCUTANEOUS
  Filled 2022-12-04: qty 0.09

## 2022-12-04 MED ORDER — LACTATED RINGERS IV SOLN: INTRAVENOUS | Status: AC

## 2022-12-04 MED ORDER — SODIUM CHLORIDE 0.9 % IV SOLN
2.0000 g | Freq: Once | INTRAVENOUS | Status: AC
  Administered 2022-12-04: 2 g via INTRAVENOUS
  Filled 2022-12-04: qty 12.5

## 2022-12-04 MED ORDER — LACTATED RINGERS IV BOLUS (SEPSIS)
500.0000 mL | Freq: Once | INTRAVENOUS | Status: AC
  Administered 2022-12-04: 500 mL via INTRAVENOUS

## 2022-12-04 MED ORDER — ONDANSETRON HCL 4 MG/2ML IJ SOLN: 4.0000 mg | Freq: Four times a day (QID) | INTRAMUSCULAR | Status: DC | PRN

## 2022-12-04 MED ORDER — LORATADINE 10 MG PO TABS
10.0000 mg | ORAL_TABLET | Freq: Every day | ORAL | Status: DC
  Administered 2022-12-04 – 2022-12-11 (×8): 10 mg via ORAL
  Filled 2022-12-04 (×8): qty 1

## 2022-12-04 MED ORDER — ALBUTEROL SULFATE (2.5 MG/3ML) 0.083% IN NEBU: 2.5000 mg | INHALATION_SOLUTION | RESPIRATORY_TRACT | Status: DC | PRN

## 2022-12-04 MED ORDER — SODIUM CHLORIDE 0.9 % IV SOLN
2.0000 g | Freq: Two times a day (BID) | INTRAVENOUS | Status: DC
  Administered 2022-12-04 – 2022-12-07 (×6): 2 g via INTRAVENOUS
  Filled 2022-12-04 (×6): qty 12.5

## 2022-12-04 MED ORDER — VANCOMYCIN HCL 2000 MG/400ML IV SOLN
2000.0000 mg | INTRAVENOUS | Status: AC
  Administered 2022-12-04: 2000 mg via INTRAVENOUS
  Filled 2022-12-04: qty 400

## 2022-12-04 NOTE — Progress Notes (Signed)
Pharmacy Antibiotic Note  Cody [name] is a 67 y.o. male admitted on 12/04/2022 with sepsis.  History of recent diagnosis of pneumonia and on 1/4 was prescribed Augmentin and azithromycin.  Pharmacy has been consulted for cefepime and vancomycin dosing.  Administered in ED: cefepime 2g IV and vancomycin 2 g IV  Plan: Continue cefepime 2 g IV every 12 hours Continue vancomycin 1250 mg IV every 36 hours (Goal AUC 400-550, eAUC 513.8, SCr used: 2.38) Monitor clinical progress, renal function, vancomycin levels as indicated F/U MRSA PCR, C&S, abx deescalation / LOT      Temp (24hrs), Avg:101.1 F (38.4 C), Min:99.3 F (37.4 C), Max:102.9 F (39.4 C)  Recent Labs  Lab 12/04/22 0916  WBC 138.7*  CREATININE 2.38*  LATICACIDVEN 0.9    CrCl cannot be calculated (Unknown ideal weight.).   Last documented height and weight: 06/02/2022 ht 72 inches and wt 103.1 kg Est CrCl using 06/02/2022 ht and wt = 37 ml/min  Allergies  Allergen Reactions   Rituxan [Rituximab] Other (See Comments)    Shaking all over and diaphoretic    Antimicrobials this admission: cefepime 1/11 >>  vancomycin 1/11 >>    Microbiology results: 1/11 BCx: sent 1/11 MRSA PCR: ordered   Thank you for allowing pharmacy to be a part of this patient's care.  Royetta Asal, PharmD, BCPS Clinical Pharmacist [place] Please utilize [facility] for appropriate phone number to reach the unit pharmacist ([place]) 12/04/2022 11:26 AM

## 2022-12-04 NOTE — Sepsis Progress Note (Signed)
Elink following code sepsis °

## 2022-12-04 NOTE — ED Triage Notes (Addendum)
Pt arrives with sister, who assists with triage. Reports that pt began feeling initially ill after christmas, and was diagnosed with pneumonia recently. Pt has been increasingly confused, and was seen at PCP yesterday and called back today to state that pt had high WBC's. Pt without complaints during triage, but sister states that is baseline and pt will deny/not complain about anything. Initial O2 saturation on room air 86%. Pt also has hx of CLL, not currently receiving tx.

## 2022-12-04 NOTE — Progress Notes (Signed)
A consult was received from an ED physician for Vanco, Cefepime per pharmacy dosing.  The patient's profile has been reviewed for ht/wt/allergies/indication/available labs.   A one time order has been placed for Vanco 2g IV x 2 and Cefepime 2g IV x 1.  Further antibiotics/pharmacy consults should be ordered by admitting physician if indicated.                        Makenzie Vittorio S. Alford Highland, PharmD, BCPS Clinical Staff Pharmacist [email or web address] Thank you, Wayland [place] 12/04/2022  8:58 AM

## 2022-12-04 NOTE — ED Notes (Signed)
PA Bowie T. In room to assess pt. Advised he would place sepsis order set instead of triage nurse. Primary nurse notified.

## 2022-12-04 NOTE — H&P (Signed)
History and Physical    Patient: Cody [name] VFI:[id number] DOB: Nov 03, 1956 DOA: 12/04/2022 DOS: the patient was seen and examined on 12/04/2022 PCP: [name], MD  Patient coming from: Home  Chief Complaint:  Chief Complaint  Patient presents with   Altered Mental Status   Abnormal Lab   HPI: Cody [name] is a 67 y.o. male with medical history significant of alcohol abuse, alcoholic cirrhosis, cataracts, type 2 diabetes, hyperlipidemia, gout who is being brought by his sister due to fever and worsening mental status with increased confusion.  He has become very weak to the point that he is unable to ambulate without assistance at home.  He had to be helped by 2 persons this morning so he can get changed to come to the emergency department.  The patient had a URI symptoms on December 26 and was subsequently diagnosed with pneumonia last week.  He has had azithromycin and Augmentin without significant results.  He was febrile and hypoxic on arrival to the emergency department.  No chest pain, palpitations, diaphoresis, PND, orthopnea or pitting edema of the lower extremities.  Appetite is decreased, but no abdominal pain, nausea, emesis, diarrhea, constipation, melena or hematochezia.  No flank pain, dysuria, frequency or hematuria.  No polyuria, polydipsia, polyphagia or blurred vision.   ED course: Initial vital signs were temperature 102.9 F, pulse 112, respirations 25, BP 150/75 mmHg O2 sat 95% on nasal cannula oxygen at 2 LPM.  The patient received acetaminophen 1000 mg p.o., 3500 mL of normal saline bolus, cefepime 2 g IVPB and vancomycin.  I added methylprednisolone 40 mg IVP and bronchodilators.  Lab work: Urinalysis shows small hemoglobinuria proteinuria 30 mg/dL with rare bacteria.  CBC with a white count of 138.7 with 93% lymphocytes, hemoglobin 11.4 g/dL platelets 177.  PT 15.7, INR 1.3 and PTT 29.  Coronavirus, RSV and influenza PCR negative.  Lactic acid was normal  x 2.  CMP showed a sodium 145, potassium 5.2, chloride 114 and CO2 22 mmol/L.  Glucose 107, BUN 53, creatinine 2.38  And calcium 8.5 mg/dL.   LFTs were normal except for an AST of 42 units/L.   Imaging:  Portable 1 view chest radiograph with subtle right lung base opacity possible  Infiltrate.  Follow-up recommended.    Review of Systems: As mentioned in the history of present illness. All other systems reviewed and are negative. Past Medical History:  Diagnosis Date   Alcoholic cirrhosis ([place])    Cataract    Diabetes mellitus without complication ([place])    History of alcohol abuse    Hypercholesterolemia    No past surgical history on file. Social History:  reports that he has never smoked. He has never used smokeless tobacco. He reports that he does not currently use alcohol. He reports that he does not use drugs.  Allergies  Allergen Reactions   Rituxan [Rituximab] Other (See Comments)    Shaking all over and diaphoretic    Family History  Problem Relation Age of Onset   Alcohol abuse Father    Cirrhosis Father    Coronary artery disease Father     Prior to Admission medications   Medication Sig Start Date End Date Taking? Authorizing Provider  amoxicillin-clavulanate (AUGMENTIN) 875-125 MG tablet Take 1 tablet by mouth 2 (two) times daily. 11/27/22  Yes [provider]  azithromycin (ZITHROMAX) 250 MG tablet TAKE 2 TABLETS BY MOUTH ON DAY 1, THEN TAKE 1 TABLET DAILY ON DAYS 2-5 11/27/22  Yes [provider]  guaiFENesin-codeine 100-10 MG/5ML syrup Take 5-10 mLs by mouth every 4 (four) hours as needed. 12/01/22  Yes [provider]  NOREL AD 4-10-325 MG TABS Take 1 tablet by mouth 3 (three) times daily. 12/01/22  Yes [provider]  allopurinol (ZYLOPRIM) 300 MG tablet TAKE 1 TABLET DAILY 11/27/19   Magrinat, Virgie Dad, MD  amLODipine (NORVASC) 10 MG tablet Take 10 mg by mouth daily.    [provider]  atorvastatin (LIPITOR) 10 MG tablet Take  10 mg by mouth daily.    [provider]  B-D UF III MINI PEN NEEDLES 31G X 5 MM MISC  08/27/20   [provider]  Blood Glucose Monitoring Suppl (ACCU-CHEK GUIDE) w/Device KIT  09/11/20   [provider]  fexofenadine (ALLEGRA) 180 MG tablet Take 180 mg by mouth at bedtime.    [provider]  hydrochlorothiazide (HYDRODIURIL) 25 MG tablet Take 25 mg by mouth daily. 12/26/19   [provider]  linagliptin (TRADJENTA) 5 MG TABS tablet Take 5 mg by mouth daily. 12/26/19   [provider]  metFORMIN (GLUCOPHAGE-XR) 500 MG 24 hr tablet Take by mouth. 02/07/22   [provider]  ondansetron (ZOFRAN) 4 MG tablet TAKE 1 TABLET BY MOUTH EVERY 8 HOURS AS NEEDED FOR NAUSEA AND VOMITING 12/28/19   Magrinat, Virgie Dad, MD  [facility] DELICA LANCETS FINE MISC 3 (three) times daily.  03/28/16   [provider]  telmisartan (MICARDIS) 80 MG tablet Take 80 mg by mouth daily. 12/10/19   [provider]  TOUJEO SOLOSTAR 300 UNIT/ML SOPN 25 Units. daily 07/04/19   [provider]    Physical Exam: Vitals:   12/04/22 0843 12/04/22 0854 12/04/22 1000 12/04/22 1030  BP: (!) 158/75  (!) 145/70 134/73  Pulse: (!) 112  100 98  Resp: (!) 25  (!) 22 19  Temp: 99.3 F (37.4 C) (!) 102.9 F (39.4 C)    TempSrc: Oral Rectal    SpO2: 95%  97% 97%   Physical Exam Vitals and nursing note reviewed.  Constitutional:      General: He is awake. He is not in acute distress.    Appearance: Normal appearance. He is ill-appearing.     Interventions: Nasal cannula in place.  HENT:     Head: Normocephalic.     Nose: No rhinorrhea.     Mouth/Throat:     Mouth: Mucous membranes are moist.  Eyes:     General: No scleral icterus.    Pupils: Pupils are equal, round, and reactive to light.  Neck:     Vascular: No JVD.  Cardiovascular:     Rate and Rhythm: Normal rate and regular rhythm.     Heart sounds: S1 normal and S2 normal.  Pulmonary:      Effort: Tachypnea present. No accessory muscle usage.     Breath sounds: Decreased air movement present. Examination of the left-lower field reveals rales. Wheezing, rhonchi and rales present.  Abdominal:     General: Bowel sounds are normal. There is no distension.     Palpations: Abdomen is soft.     Tenderness: There is no abdominal tenderness. There is no guarding.  Musculoskeletal:     Cervical back: Neck supple.     Right lower leg: No edema.     Left lower leg: No edema.  Skin:    General: Skin is warm and dry.  Neurological:     General: No focal deficit present.  Mental Status: He is alert. Mental status is at baseline.  Psychiatric:        Mood and Affect: Mood normal.        Behavior: Behavior normal. Behavior is cooperative.   Data Reviewed:  There are no new results to review at this time.  Assessment and Plan: Principal Problem:   HCAP (healthcare-associated pneumonia) Admit to PCU/inpatient. Continue IV fluids. Continue cefepime 2 g every 8 hours.   Continue vancomycin per pharmacy. Bronchodilators as needed. Methylprednisolone 40 mg IVP x 1. Follow-up blood culture and sensitivity Follow CBC and CMP in a.m.  Active Problems:   AKI (acute kidney injury) ([place]) Continue IV fluids. Hold ARB/ACE. Hold diuretic. Avoid hypotension. Avoid nephrotoxins. Monitor intake and output. Monitor renal function electrolytes.    Chronic lymphoid leukemia (HCC) Allergic to rituximab. Follow-up with oncology as an outpatient.    Hypertension Continue amlodipine 10 mg p.o. daily. Monitor blood pressure. Holding ARB and diuretic as above mentioned.    Diabetes 1.5, managed as type 2 ([place]) Has not been eating much. Stated he is not hungry at the moment. Monitor CBG before meals and bedtime. Initiate RI coverage if glucose increases.    Hyperkalemia In the setting of AKI. Continue IV fluids. Holding HCTZ and telmisartan.    Hypocalcemia Repeat calcium level  in the morning. Further workup depending on results.    Advance Care Planning:   Code Status: Full Code   Consults:   Family Communication: His sister was at bedside.  Severity of Illness: The appropriate patient status for this patient is INPATIENT. Inpatient status is judged to be reasonable and necessary in order to provide the required intensity of service to ensure the patient's safety. The patient's presenting symptoms, physical exam findings, and initial radiographic and laboratory data in the context of their chronic comorbidities is felt to place them at high risk for further clinical deterioration. Furthermore, it is not anticipated that the patient will be medically stable for discharge from the hospital within 2 midnights of admission.   * I certify that at the point of admission it is my clinical judgment that the patient will require inpatient hospital care spanning beyond 2 midnights from the point of admission due to high intensity of service, high risk for further deterioration and high frequency of surveillance required.*  Author: Reubin Milan, MD 12/04/2022 10:58 AM  For on call review [email or web address].   This document was prepared using Dragon voice recognition software and may contain some unintended transcription errors.

## 2022-12-04 NOTE — ED Provider Notes (Signed)
[facility] DEPT Provider Note   CSN: [id number] Arrival date & time: 12/04/22  6226     History  No chief complaint on file.   Cody [name] is a 67 y.o. male.  The history is provided by the patient, the spouse and medical records. No language interpreter was used.     67 year old male with history of alcohol cirrhosis diabetes and hypercholesterolemia with history of chronic lymphocytic leukemia cared for by Dr. Lorenso Courier presenting today with complaint of cold symptoms.  History obtained through patient but mostly through his wife who is at bedside.  Patient developed cough and was diagnosed with pneumonia approximately a week ago.  He was prescribed Augmentin and azithromycin for which he has been taking with minimal relief.  He was also provided cough medication however he is becoming increasingly more drowsy, confused, having persistent cough and having night sweats.  He is currently not on any chemo treatment.  No report of vomiting or diarrhea.  He does have some mild shortness of breath when he coughs.  He has decrease in appetite.  He was seen by his PCP recently and PCP call today notifying that his white count is elevated.  Home Medications Prior to Admission medications   Medication Sig Start Date End Date Taking? Authorizing Provider  allopurinol (ZYLOPRIM) 300 MG tablet TAKE 1 TABLET DAILY 11/27/19   Magrinat, Virgie Dad, MD  amLODipine (NORVASC) 10 MG tablet Take 10 mg by mouth daily.    [provider]  atorvastatin (LIPITOR) 10 MG tablet Take 10 mg by mouth daily.    [provider]  B-D UF III MINI PEN NEEDLES 31G X 5 MM MISC  08/27/20   [provider]  Blood Glucose Monitoring Suppl (ACCU-CHEK GUIDE) w/Device KIT  09/11/20   [provider]  fexofenadine (ALLEGRA) 180 MG tablet Take 180 mg by mouth at bedtime.    [provider]  hydrochlorothiazide (HYDRODIURIL) 25 MG tablet Take 25 mg by  mouth daily. 12/26/19   [provider]  linagliptin (TRADJENTA) 5 MG TABS tablet Take 5 mg by mouth daily. 12/26/19   [provider]  metFORMIN (GLUCOPHAGE-XR) 500 MG 24 hr tablet Take by mouth. 02/07/22   [provider]  ondansetron (ZOFRAN) 4 MG tablet TAKE 1 TABLET BY MOUTH EVERY 8 HOURS AS NEEDED FOR NAUSEA AND VOMITING 12/28/19   Magrinat, Virgie Dad, MD  [facility] DELICA LANCETS FINE MISC 3 (three) times daily.  03/28/16   [provider]  telmisartan (MICARDIS) 80 MG tablet Take 80 mg by mouth daily. 12/10/19   [provider]  TOUJEO SOLOSTAR 300 UNIT/ML SOPN 25 Units. daily 07/04/19   [provider]      Allergies    Rituxan [rituximab]    Review of Systems   Review of Systems  All other systems reviewed and are negative.   Physical Exam Updated Vital Signs BP (!) 145/70 (BP Location: Right Arm)   Pulse 100   Temp (!) 102.9 F (39.4 C) (Rectal)   Resp (!) 22   SpO2 97%  Physical Exam Vitals and nursing note reviewed.  Constitutional:      General: He is not in acute distress.    Appearance: He is well-developed.     Comments: Elderly male laying in bed coughing on occasion and appears fatigued.  HENT:     Head: Atraumatic.  Eyes:     Conjunctiva/sclera: Conjunctivae normal.  Cardiovascular:     Rate and  Rhythm: Tachycardia present.     Pulses: Normal pulses.     Heart sounds: Normal heart sounds.  Pulmonary:     Breath sounds: Rhonchi and rales present. No wheezing.  Abdominal:     Palpations: Abdomen is soft.  Musculoskeletal:     Cervical back: Neck supple.     Right lower leg: No edema.     Left lower leg: No edema.  Skin:    Findings: No rash.  Neurological:     Mental Status: He is alert. He is disoriented.  Psychiatric:        Mood and Affect: Mood normal.     ED Results / Procedures / Treatments   Labs (all labs ordered are listed, but only abnormal results are displayed) Labs Reviewed   COMPREHENSIVE METABOLIC PANEL - Abnormal; Notable for the following components:      Result Value   Potassium 5.2 (*)    Chloride 114 (*)    Glucose, Bld 107 (*)    BUN 53 (*)    Creatinine, Ser 2.38 (*)    Calcium 8.5 (*)    AST 42 (*)    GFR, Estimated 29 (*)    All other components within normal limits  CBC WITH DIFFERENTIAL/PLATELET - Abnormal; Notable for the following components:   WBC 138.7 (*)    Hemoglobin 11.4 (*)    MCHC 28.6 (*)    Lymphs Abs 128.0 (*)    Monocytes Absolute 2.4 (*)    Abs Immature Granulocytes 0.46 (*)    All other components within normal limits  CBG MONITORING, ED - Abnormal; Notable for the following components:   Glucose-Capillary 108 (*)    All other components within normal limits  RESP PANEL BY RT-PCR (RSV, FLU A&B, COVID)  RVPGX2  CULTURE, BLOOD (ROUTINE X 2)  CULTURE, BLOOD (ROUTINE X 2)  URINE CULTURE  LACTIC ACID, PLASMA  PROTIME-INR  APTT  LACTIC ACID, PLASMA  URINALYSIS, ROUTINE W REFLEX MICROSCOPIC  PROTIME-INR  APTT    EKG None ED ECG REPORT   Date: 12/04/2022  Rate: 108  Rhythm: sinus tachycardia  QRS Axis: normal  Intervals: normal  ST/T Wave abnormalities: normal  Conduction Disutrbances:none  Narrative Interpretation: LVH  Old EKG Reviewed: unchanged  I have personally reviewed the EKG tracing and agree with the computerized printout as noted.   Radiology DG Chest Port 1 View  Result Date: 12/04/2022 CLINICAL DATA:  Sepsis. EXAM: PORTABLE CHEST 1 VIEW COMPARISON:  X-ray 12/06/2013 FINDINGS: Overlapping cardiac leads. Subtle opacity at the right lung base. Infiltrate is possible. Recommend follow-up. No pneumothorax, effusion or edema. Film is rotated to the right and there is patient tilt. Normal cardiopericardial silhouette when adjusted for technique. IMPRESSION: Subtle right lung base opacity. Possible infiltrate. Recommend follow-up Electronically Signed   By: Jill Side M.D.   On: 12/04/2022 10:05     Procedures .Critical Care  Performed by: Domenic Moras, PA-C Authorized by: Domenic Moras, PA-C   Critical care provider statement:    Critical care time (minutes):  45   Critical care was time spent personally by me on the following activities:  Development of treatment plan with patient or surrogate, discussions with consultants, evaluation of patient's response to treatment, examination of patient, ordering and review of laboratory studies, ordering and review of radiographic studies, ordering and performing treatments and interventions, pulse oximetry, re-evaluation of patient's condition and review of old charts     Medications Ordered in ED Medications  lactated ringers infusion (  Intravenous New Bag/Given 12/04/22 0930)  lactated ringers bolus 1,000 mL (1,000 mLs Intravenous New Bag/Given 12/04/22 0919)    And  lactated ringers bolus 1,000 mL (1,000 mLs Intravenous New Bag/Given 12/04/22 1001)    And  lactated ringers bolus 1,000 mL (has no administration in time range)    And  lactated ringers bolus 500 mL (has no administration in time range)  vancomycin (VANCOREADY) IVPB 2000 mg/400 mL (2,000 mg Intravenous New Bag/Given 12/04/22 1001)  ceFEPIme (MAXIPIME) 2 g in sodium chloride 0.9 % 100 mL IVPB (2 g Intravenous New Bag/Given 12/04/22 0921)  acetaminophen (TYLENOL) tablet 1,000 mg (1,000 mg Oral Given 12/04/22 9741)    ED Course/ Medical Decision Making/ A&P                           Medical Decision Making Amount and/or Complexity of Data Reviewed Labs: ordered. Radiology: ordered. ECG/medicine tests: ordered.  Risk OTC drugs. Prescription drug management. Decision regarding hospitalization.   BP (!) 158/75 (BP Location: Right Arm)   Pulse (!) 112   Temp (!) 102.9 F (39.4 C) (Rectal)   Resp (!) 25   SpO2 95%   43:51 AM 67 year old male with history of alcohol cirrhosis diabetes and hypercholesterolemia with history of chronic lymphocytic leukemia cared for by  Dr. Lorenso Courier presenting today with complaint of cold symptoms.  History obtained through patient but mostly through his wife who is at bedside.  Patient developed cough and was diagnosed with pneumonia approximately a week ago.  He was prescribed Augmentin and azithromycin for which he has been taking with minimal relief.  He was also provided cough medication however he is becoming increasingly more drowsy, confused, having persistent cough and having night sweats.  He is currently not on any chemo treatment.  No report of vomiting or diarrhea.  He does have some mild shortness of breath when he coughs.  He has decrease in appetite.  He was seen by his PCP recently and PCP call today notifying that his white count is elevated.  On exam, this is an elderly male laying in bed appears fatigued.  His wife is doing most of the question answering for him.  He is actively coughing.  Exam remarkable for tachycardia, lung exam remarkable for rales in the lung bases with adventitious breath sounds.  He does not appear to be fluid overloaded.  Vital signs reviewed and remarkable for an oral temperature of 99.3, will check rectal temperature.  Heart rate is elevated at 112, he is tachypneic with respiratory of 25, O2 sats is 95% on room air.  His current blood pressure is 158/75.  I have initiated code sepsis, will give patient broad-spectrum antibiotic including cefepime and vancomycin as well as IV fluid.  Anticipate hospital admission.  This patient presents to the ED for concern of confusion, this involves an extensive number of treatment options, and is a complaint that carries with it a high risk of complications and morbidity.  The differential diagnosis includes sepsis, pneumonia, UTI, electrolytes derangement, stroke, anemia, dehydration, CLL, intoxication  Co morbidities that complicate the patient evaluation CLL, DM, alcohol abuse Additional history obtained:  Additional history obtained from  wife External records from outside source obtained and reviewed including EMR including notes from oncology, along with labs and imaging.  Note were faxed from PCP office  Lab Tests:  I Ordered, and personally interpreted labs.  The pertinent results include:   -BUN 53, Cr 2.38  consistent with AKI, IVF given -WBC 138 suggestive of CLL -viral resp panel negative for Covid, flu, RSV  Imaging Studies ordered:  I ordered imaging studies including CXR I independently visualized and interpreted imaging which showed subtle right lung base opacity concerning for PNA I agree with the radiologist interpretation  Cardiac Monitoring:  The patient was maintained on a cardiac monitor.  I personally viewed and interpreted the cardiac monitored which showed an underlying rhythm of: sinus tachycardia  Medicines ordered and prescription drug management:  I ordered medication including cefepime/vanc  for pneumonia Reevaluation of the patient after these medicines showed that the patient improved I have reviewed the patients home medicines and have made adjustments as needed  Test Considered: as above  Critical Interventions: code sepsis  Fluid resuscitation  Broad spectrum abx  antipyretic  Consultations Obtained:  I requested consultation with the oncologist Dr. Lorenso Courier ,  and discussed lab and imaging findings as well as pertinent plan - they recommend: outpt f/u for his CLL  Problem List / ED Course: sepsis  HCAP  CLL   AKI  Reevaluation:  After the interventions noted above, I reevaluated the patient and found that they have :improved  Social Determinants of Health: none  Dispostion:  After consideration of the diagnostic results and the patients response to treatment, I feel that the patent would benefit from admission.  10:52 AM Patient meets sepsis criteria.  Patient receiving antibiotic and fluid resuscitation.  I have consulted with oncologist Dr. Lorenso Courier who felt patient  does not need any immediate intervention from an oncology standpoint but can follow-up outpatient.  I have consulted [facility] hospitalist, Dr. Olevia Bowens who agrees to see and will admit patient for further managements of his pneumonia that failed outpatient treatment.  Sepsis reassessment done, patient reported no worsening of symptoms.        Final Clinical Impression(s) / ED Diagnoses Final diagnoses:  Sepsis with acute renal failure without septic shock, due to unspecified organism, unspecified acute renal failure type ([place])  CLL (chronic lymphocytic leukemia) ([place])  HCAP (healthcare-associated pneumonia)    Rx / DC Orders ED Discharge Orders     None         Domenic Moras, PA-C 12/04/22 1110    Milton Ferguson, MD 12/06/22 1014

## 2022-12-05 ENCOUNTER — Other Ambulatory Visit: Payer: Self-pay

## 2022-12-05 ENCOUNTER — Encounter (HOSPITAL_COMMUNITY): Payer: Self-pay

## 2022-12-05 DIAGNOSIS — E875 Hyperkalemia: Secondary | ICD-10-CM | POA: Diagnosis not present

## 2022-12-05 DIAGNOSIS — C9110 Chronic lymphocytic leukemia of B-cell type not having achieved remission: Secondary | ICD-10-CM | POA: Diagnosis not present

## 2022-12-05 DIAGNOSIS — N179 Acute kidney failure, unspecified: Secondary | ICD-10-CM

## 2022-12-05 DIAGNOSIS — J189 Pneumonia, unspecified organism: Secondary | ICD-10-CM | POA: Diagnosis not present

## 2022-12-05 DIAGNOSIS — I10 Essential (primary) hypertension: Secondary | ICD-10-CM

## 2022-12-05 LAB — BASIC METABOLIC PANEL
Anion gap: 10 (ref 5–15)
BUN: 47 mg/dL — ABNORMAL HIGH (ref 8–23)
CO2: 21 mmol/L — ABNORMAL LOW (ref 22–32)
Calcium: 8.3 mg/dL — ABNORMAL LOW (ref 8.9–10.3)
Chloride: 111 mmol/L (ref 98–111)
Creatinine, Ser: 1.77 mg/dL — ABNORMAL HIGH (ref 0.61–1.24)
GFR, Estimated: 41 mL/min — ABNORMAL LOW (ref >60)
Glucose, Bld: 161 mg/dL — ABNORMAL HIGH (ref 70–99)
Potassium: 5.4 mmol/L — ABNORMAL HIGH (ref 3.5–5.1)
Sodium: 142 mmol/L (ref 135–145)

## 2022-12-05 LAB — HEMOGLOBIN A1C
Hgb A1c MFr Bld: 6.2 % — ABNORMAL HIGH (ref 4.8–5.6)
Mean Plasma Glucose: 131.24 mg/dL

## 2022-12-05 LAB — CBC WITH DIFFERENTIAL/PLATELET
Abs Immature Granulocytes: 0.46 10*3/uL — ABNORMAL HIGH (ref 0.00–0.07)
Basophils Absolute: 0.1 10*3/uL (ref 0.0–0.1)
Basophils Relative: 0 %
Eosinophils Absolute: 0 10*3/uL (ref 0.0–0.5)
Eosinophils Relative: 0 %
HCT: 36.2 % — ABNORMAL LOW (ref 39.0–52.0)
Hemoglobin: 10 g/dL — ABNORMAL LOW (ref 13.0–17.0)
Immature Granulocytes: 0 %
Lymphocytes Relative: 92 %
Lymphs Abs: 109 10*3/uL — ABNORMAL HIGH (ref 0.7–4.0)
MCH: 26 pg (ref 26.0–34.0)
MCHC: 27.6 g/dL — ABNORMAL LOW (ref 30.0–36.0)
MCV: 94.3 fL (ref 80.0–100.0)
Monocytes Absolute: 1.5 10*3/uL — ABNORMAL HIGH (ref 0.1–1.0)
Monocytes Relative: 1 %
Neutro Abs: 8 10*3/uL — ABNORMAL HIGH (ref 1.7–7.7)
Neutrophils Relative %: 7 %
Platelets: 183 10*3/uL (ref 150–400)
RBC: 3.84 MIL/uL — ABNORMAL LOW (ref 4.22–5.81)
RDW: 15.9 % — ABNORMAL HIGH (ref 11.5–15.5)
WBC Morphology: ABNORMAL
WBC: 119.1 10*3/uL (ref 4.0–10.5)
nRBC: 0 % (ref 0.0–0.2)

## 2022-12-05 LAB — GLUCOSE, CAPILLARY
Glucose-Capillary: 137 mg/dL — ABNORMAL HIGH (ref 70–99)
Glucose-Capillary: 188 mg/dL — ABNORMAL HIGH (ref 70–99)
Glucose-Capillary: 175 mg/dL — ABNORMAL HIGH (ref 70–99)
Glucose-Capillary: 160 mg/dL — ABNORMAL HIGH (ref 70–99)

## 2022-12-05 LAB — URINE CULTURE: Culture: NO GROWTH

## 2022-12-05 LAB — COMPREHENSIVE METABOLIC PANEL
ALT: 30 U/L (ref 0–44)
AST: 33 U/L (ref 15–41)
Albumin: 3.1 g/dL — ABNORMAL LOW (ref 3.5–5.0)
Alkaline Phosphatase: 60 U/L (ref 38–126)
Anion gap: 10 (ref 5–15)
BUN: 48 mg/dL — ABNORMAL HIGH (ref 8–23)
CO2: 22 mmol/L (ref 22–32)
Calcium: 8.6 mg/dL — ABNORMAL LOW (ref 8.9–10.3)
Chloride: 118 mmol/L — ABNORMAL HIGH (ref 98–111)
Creatinine, Ser: 1.81 mg/dL — ABNORMAL HIGH (ref 0.61–1.24)
GFR, Estimated: 40 mL/min — ABNORMAL LOW (ref >60)
Glucose, Bld: 165 mg/dL — ABNORMAL HIGH (ref 70–99)
Potassium: 5.4 mmol/L — ABNORMAL HIGH (ref 3.5–5.1)
Sodium: 150 mmol/L — ABNORMAL HIGH (ref 135–145)
Total Bilirubin: 0.6 mg/dL (ref 0.3–1.2)
Total Protein: 6.4 g/dL — ABNORMAL LOW (ref 6.5–8.1)

## 2022-12-05 LAB — HIV ANTIBODY (ROUTINE TESTING W REFLEX): HIV Screen 4th Generation wRfx: NONREACTIVE

## 2022-12-05 MED ORDER — ADULT MULTIVITAMIN W/MINERALS CH
1.0000 | ORAL_TABLET | Freq: Every day | ORAL | Status: DC
  Administered 2022-12-05 – 2022-12-12 (×8): 1 via ORAL
  Filled 2022-12-05 (×8): qty 1

## 2022-12-05 MED ORDER — THIAMINE HCL 100 MG/ML IJ SOLN
100.0000 mg | Freq: Every day | INTRAMUSCULAR | Status: DC
  Filled 2022-12-05: qty 2

## 2022-12-05 MED ORDER — SODIUM ZIRCONIUM CYCLOSILICATE 10 G PO PACK
10.0000 g | PACK | Freq: Two times a day (BID) | ORAL | Status: AC
  Administered 2022-12-05 (×2): 10 g via ORAL
  Filled 2022-12-05 (×2): qty 1

## 2022-12-05 MED ORDER — LORAZEPAM 1 MG PO TABS: 1.0000 mg | ORAL_TABLET | ORAL | Status: DC | PRN

## 2022-12-05 MED ORDER — THIAMINE MONONITRATE 100 MG PO TABS
100.0000 mg | ORAL_TABLET | Freq: Every day | ORAL | Status: DC
  Administered 2022-12-05 – 2022-12-12 (×8): 100 mg via ORAL
  Filled 2022-12-05 (×8): qty 1

## 2022-12-05 MED ORDER — LORAZEPAM 0.5 MG PO TABS: 0.5000 mg | ORAL_TABLET | ORAL | Status: DC | PRN

## 2022-12-05 MED ORDER — FOLIC ACID 1 MG PO TABS
1.0000 mg | ORAL_TABLET | Freq: Every day | ORAL | Status: DC
  Administered 2022-12-05 – 2022-12-12 (×8): 1 mg via ORAL
  Filled 2022-12-05 (×8): qty 1

## 2022-12-05 NOTE — Evaluation (Signed)
Clinical/Bedside Swallow Evaluation Patient Details  Name: Cody [name] MRN: [id number] Date of Birth: [birth year]  Today's Date: 12/05/2022 Time: SLP Start Time (ACUTE ONLY): 1820 SLP Stop Time (ACUTE ONLY): 1855 SLP Time Calculation (min) (ACUTE ONLY): 35 min  Past Medical History:  Past Medical History:  Diagnosis Date   Alcoholic cirrhosis ([place])    Cataract    Diabetes mellitus without complication ([place])    High blood pressure 10/24/2004   History of alcohol abuse    Hypercholesterolemia    Past Surgical History: History reviewed. No pertinent surgical history. HPI:  Cody [name] is a 67 yo male adm to [facility] with AMS and increased confusion. Pt has PMH + for ETOH use, alcoholic cirrhosis, gout, recent URI - unable to ambulate without assist at home.  CXR concerning for potential Right Lobe infiltrate.  Swallow eval ordered. Pt denies issues with swallowing.    Assessment / Plan / Recommendation  Clinical Impression  Patient presents with potential cognitive based oral difficulties c/b excessively prolonged mastication of solids, oral retention wiht right sulci retention without consistent awareness. He required up to 1 minute to eat a small bite of cracker. SLP faciliated clearance by giving pt applesauce and water to clear.   He reports he uses dentures for all po and they are at home. Called his sister and left message requesting she bring teeth and adhesive next date for optimal eating/safety for pt.  Cody [name] is rapid liquid consumer- benefiting from straw pinching by SLP to slow rate. No indication of aspiration - though RN reports he coughed while drinking water - and sister reports she tells him to "slow down" all of the time.  Will follow up x1 given potential RL infiltrate, oral dysphagia and impulsivity.  Relayed info to Doctor, general practice. Thanks for this consult.  Of note, pt reports his po intake has decreased due to lack of appetite but states he is trying to eat more. SLP Visit  Diagnosis: Dysphagia, oral phase (R13.11)    Aspiration Risk  Mild aspiration risk;Risk for inadequate nutrition/hydration    Diet Recommendation Regular;Dysphagia 3 (Mech soft);Thin liquid (order only soft foods until dentures brought)   Liquid Administration via: Cup;Straw Medication Administration: Whole meds with liquid Supervision: Staff to assist with self feeding Compensations: Slow rate;Small sips/bites;Lingual sweep for clearance of pocketing Postural Changes: Seated upright at 90 degrees;Remain upright for at least 30 minutes after po intake    Other  Recommendations Oral Care Recommendations: Oral care BID    Recommendations for follow up therapy are one component of a multi-disciplinary discharge planning process, led by the attending physician.  Recommendations may be updated based on patient status, additional functional criteria and insurance authorization.  Follow up Recommendations No SLP follow up      Assistance Recommended at Discharge    Functional Status Assessment Patient has had a recent decline in their functional status and demonstrates the ability to make significant improvements in function in a reasonable and predictable amount of time.  Frequency and Duration min 1 x/week  1 week       Prognosis Prognosis for Safe Diet Advancement: Good Barriers to Reach Goals: Cognitive deficits      Swallow Study   General Date of Onset: 12/05/22 HPI: Cody [name] is a 67 yo male adm to [facility] with AMS and increased confusion. Pt has PMH + for ETOH use, alcoholic cirrhosis, gout, recent URI - unable to ambulate without assist at home.  CXR concerning for potential Right Lobe infiltrate.  Swallow eval ordered. Pt denies issues with swallowing. Type of Study: Bedside Swallow Evaluation Diet Prior to this Study: Regular;Thin liquids Temperature Spikes Noted: No Respiratory Status: Nasal cannula (4 L) History of Recent Intubation: No Behavior/Cognition: Alert;Confused;Other  (Comment) (sleepy, some difficulty following directions) Oral Care Completed by SLP: No Oral Cavity - Dentition: Dentures, not available (called sister to request they be brought in) Vision: Functional for self-feeding Self-Feeding Abilities: Other (Comment) (tremorous but able to hold cup and finger food) Patient Positioning: Upright in bed Baseline Vocal Quality: Normal Volitional Cough: Cognitively unable to elicit Volitional Swallow: Unable to elicit    Oral/Motor/Sensory Function Overall Oral Motor/Sensory Function: Generalized oral weakness   Ice Chips Ice chips: Not tested   Thin Liquid Thin Liquid: Within functional limits Presentation: Straw;Self Fed    Nectar Thick Nectar Thick Liquid: Not tested   Honey Thick Honey Thick Liquid: Not tested   Puree Puree: Within functional limits Presentation: Spoon   Solid     Solid: Impaired Presentation: Self Fed Oral Phase Impairments: Reduced lingual movement/coordination;Impaired mastication;Poor awareness of bolus Oral Phase Functional Implications: Prolonged oral transit;Impaired mastication;Right lateral sulci pocketing;Oral residue Other Comments: pt needed puree and liquids to clear oral cavity      Macario Golds 12/05/2022,7:23 PM   Kathleen Lime, MS Ward Office [phone] Pager [phone]

## 2022-12-05 NOTE — Progress Notes (Signed)
[facility]                                                                               Cody [name], is a 67 y.o. male, DOB - 1956-02-13, XBD:[id number] Admit date - 12/04/2022    Outpatient Primary MD for the patient is [name], MD  LOS - 1  days    Brief summary    Cody [name] is a 67 y.o. male with medical history significant of alcohol abuse, alcoholic cirrhosis, cataracts, type 2 diabetes, hyperlipidemia, gout who is being brought by his sister due to fever and worsening mental status with increased confusion.  He had to be helped by 2 persons this morning so he can get changed to come to the emergency department. The patient had a URI symptoms on December 26 and was subsequently diagnosed with pneumonia last week.    Portable 1 view chest radiograph with subtle right lung base opacity possible  Infiltrate.  Follow-up recommended.     Assessment & Plan    Assessment and Plan:   Healthcare associated pneumonia Patient was admitted for IV antibiotics, he was started on IV cefepime and vancomycin. MRSA PCR is negative, DC vancomycin. Continue with cefepime. Follow blood cultures. Obtain sputum cultures. Nasal cannula oxygen to keep sats greater than 90%.     Acute kidney injury probably secondary to dehydration Holding ARB and ACE and diuretics at this time. He was admitted with a creatinine of 2.38 was started on IV fluids and creatinine has improved to 1.8.    -Hyperkalemia  Lokelma ordered Check potassium level later tonight.     Hypernatremia probably secondary to free water deficit Hydrate and recheck sodium tonight   Anemia of chronic disease Hemoglobin between 10-11 suspect hemoconcentrated sample due to dehydration.  Continue to monitor    Diabetes mellitus Continue with sliding scale insulin.   Hypocalcemia Corrected calcium level is greater than 9.    History of CLL Recommend outpatient follow-up  with oncology at this time.    Hyperlipidemia Continue with Lipitor 10 mg daily    Alcohol use disorder  Will start the patient on CIWA.   Estimated body mass index is 27.06 kg/m as calculated from the following:   Height as of this encounter: 6' (1.829 m).   Weight as of this encounter: 90.5 kg.  Code Status: full code.  DVT Prophylaxis:  enoxaparin (LOVENOX) injection 40 mg Start: 12/04/22 1115   Level of Care: Level of care: Progressive Family Communication: none at bedside.   Disposition Plan:     Remains inpatient appropriate:  alcohol withdrawal, hyperkalemia and AKI.   Procedures:  None.   Consultants:   None.   Antimicrobials:   Anti-infectives (From admission, onward)    Start     Dose/Rate Route Frequency Ordered Stop   12/05/22 2200  vancomycin (VANCOREADY) IVPB 1250 mg/250 mL  Status:  Discontinued        1,250 mg 166.7 mL/hr over 90 Minutes Intravenous Every 36 hours 12/04/22 1212 12/05/22 0903   12/04/22 2200  ceFEPIme (MAXIPIME) 2 g in sodium chloride 0.9 % 100 mL IVPB        2  g 200 mL/hr over 30 Minutes Intravenous Every 12 hours 12/04/22 1121 12/11/22 2159   12/04/22 0900  vancomycin (VANCOCIN) IVPB 1000 mg/200 mL premix  Status:  Discontinued        1,000 mg 200 mL/hr over 60 Minutes Intravenous  Once 12/04/22 0853 12/04/22 0857   12/04/22 0900  ceFEPIme (MAXIPIME) 2 g in sodium chloride 0.9 % 100 mL IVPB        2 g 200 mL/hr over 30 Minutes Intravenous  Once 12/04/22 0853 12/04/22 0951   12/04/22 0900  vancomycin (VANCOREADY) IVPB 2000 mg/400 mL        2,000 mg 200 mL/hr over 120 Minutes Intravenous STAT 12/04/22 0857 12/04/22 1201        Medications  Scheduled Meds:  albuterol  2.5 mg Nebulization QID   allopurinol  300 mg Oral Daily   amLODipine  10 mg Oral Daily   atorvastatin  10 mg Oral Daily   enoxaparin (LOVENOX) injection  40 mg Subcutaneous Daily   insulin aspart  0-9 Units Subcutaneous TID WC   loratadine  10 mg Oral QHS    sodium zirconium cyclosilicate  10 g Oral BID   Continuous Infusions:  ceFEPime (MAXIPIME) IV 2 g (12/05/22 1103)   PRN Meds:.acetaminophen **OR** acetaminophen, albuterol, ondansetron **OR** ondansetron (ZOFRAN) IV    Subjective:   Cody [name] was seen and examined today.  Reported that he is in the hospital because he drank too much.   Objective:   Vitals:   12/05/22 0801 12/05/22 1058 12/05/22 1134 12/05/22 1143  BP:  114/68 (!) 116/59   Pulse:   84   Resp:   20   Temp:   97.8 F (36.6 C)   TempSrc:   Oral   SpO2:   95% 95%  Weight: 90.5 kg     Height: 6' (1.829 m)       Intake/Output Summary (Last 24 hours) at 12/05/2022 1406 Last data filed at 12/05/2022 1135 Gross per 24 hour  Intake 2968.88 ml  Output 1975 ml  Net 993.88 ml   Filed Weights   12/05/22 0801  Weight: 90.5 kg     Exam General exam: Appears calm and comfortable  Respiratory system: Clear to auscultation. Respiratory effort normal. Cardiovascular system: S1 & S2 heard, RRR. No JVD, murmurs, Gastrointestinal system: Abdomen is nondistended, soft and nontender. Central nervous system: Alert , oriented to place and person, but remains confused.  Extremities: Symmetric 5 x 5 power. Skin: No rashes, lesions or ulcers Psychiatry: anxious.     Data Reviewed:  I have personally reviewed following labs and imaging studies   CBC Lab Results  Component Value Date   WBC 119.1 (HH) 12/05/2022   RBC 3.84 (L) 12/05/2022   HGB 10.0 (L) 12/05/2022   HCT 36.2 (L) 12/05/2022   MCV 94.3 12/05/2022   MCH 26.0 12/05/2022   PLT 183 12/05/2022   MCHC 27.6 (L) 12/05/2022   RDW 15.9 (H) 12/05/2022   LYMPHSABS 109.0 (H) 12/05/2022   MONOABS 1.5 (H) 12/05/2022   EOSABS 0.0 12/05/2022   BASOSABS 0.1 95/07/3266     Last metabolic panel Lab Results  Component Value Date   NA 150 (H) 12/05/2022   K 5.4 (H) 12/05/2022   CL 118 (H) 12/05/2022   CO2 22 12/05/2022   BUN 48 (H) 12/05/2022    CREATININE 1.81 (H) 12/05/2022   GLUCOSE 165 (H) 12/05/2022   GFRNONAA 40 (L) 12/05/2022   GFRAA 51 (L) 05/03/2020  CALCIUM 8.6 (L) 12/05/2022   PROT 6.4 (L) 12/05/2022   ALBUMIN 3.1 (L) 12/05/2022   BILITOT 0.6 12/05/2022   ALKPHOS 60 12/05/2022   AST 33 12/05/2022   ALT 30 12/05/2022   ANIONGAP 10 12/05/2022    CBG (last 3)  Recent Labs    12/04/22 2209 12/05/22 0731 12/05/22 1133  GLUCAP 114* 137* 188*      Coagulation Profile: Recent Labs  Lab 12/04/22 0916 12/04/22 1328  INR 1.2 1.3*     Radiology Studies: [facility] Chest Port 1 View  Result Date: 12/04/2022 CLINICAL DATA:  Sepsis. EXAM: PORTABLE CHEST 1 VIEW COMPARISON:  X-ray 12/06/2013 FINDINGS: Overlapping cardiac leads. Subtle opacity at the right lung base. Infiltrate is possible. Recommend follow-up. No pneumothorax, effusion or edema. Film is rotated to the right and there is patient tilt. Normal cardiopericardial silhouette when adjusted for technique. IMPRESSION: Subtle right lung base opacity. Possible infiltrate. Recommend follow-up Electronically Signed   By: Jill Side M.D.   On: 12/04/2022 10:05       Hosie Poisson M.D. [facility] 12/05/2022, 2:06 PM  Available via Epic secure chat 7am-7pm After 7 pm, please refer to night coverage provider listed on amion.

## 2022-12-05 NOTE — TOC Initial Note (Signed)
Transition of Care [facility]) - Initial/Assessment Note    Patient Details  Name: Cody [name] MRN: [id number] Date of Birth: 09/11/1956  [facility] CM/SW Contact:    Leeroy Cha, RN Phone Number: 12/05/2022, 8:41 AM  Clinical Narrative:                   Transition of Care [facility]) Screening Note   Patient Details  Name: Cody [name] Date of Birth: November 29, 1955   Transition of Care [facility]) CM/SW Contact:    Leeroy Cha, RN Phone Number: 12/05/2022, 8:41 AM    Transition of Care Department [facility]) has reviewed patient and no TOC needs have been identified at this time. We will continue to monitor patient advancement through interdisciplinary progression rounds. If new patient transition needs arise, please place a TOC consult.   Expected Discharge Plan: Home/Self Care Barriers to Discharge: Continued Medical Work up   Patient Goals and CMS Choice Patient states their goals for this hospitalization and ongoing recovery are:: to return to my home CMS Medicare.gov Compare Post Acute Care list provided to:: Patient        Expected Discharge Plan and Services   Discharge Planning Services: CM Consult   Living arrangements for the past 2 months: Single Family Home                                      Prior Living Arrangements/Services Living arrangements for the past 2 months: Single Family Home Lives with:: Self Patient language and need for interpreter reviewed:: Yes Do you feel safe going back to the place where you live?: Yes            Criminal Activity/Legal Involvement Pertinent to Current Situation/Hospitalization: No - Comment as needed  Activities of Daily Living Home Assistive Devices/Equipment: None ADL Screening (condition at time of admission) Patient's cognitive ability adequate to safely complete daily activities?: Yes Is the patient deaf or have difficulty hearing?: Yes Does the patient have difficulty  seeing, even when wearing glasses/contacts?: No Does the patient have difficulty concentrating, remembering, or making decisions?: Yes Patient able to express need for assistance with ADLs?: No Does the patient have difficulty dressing or bathing?: Yes Independently performs ADLs?: No Communication: Needs assistance Is this a change from baseline?: Pre-admission baseline Dressing (OT): Dependent Is this a change from baseline?: Pre-admission baseline Grooming: Needs assistance Is this a change from baseline?: Pre-admission baseline Feeding: Needs assistance Is this a change from baseline?: Pre-admission baseline Bathing: Dependent Is this a change from baseline?: Pre-admission baseline Toileting: Dependent Is this a change from baseline?: Pre-admission baseline In/Out Bed: Needs assistance Is this a change from baseline?: Pre-admission baseline Walks in Home: Needs assistance Is this a change from baseline?: Pre-admission baseline Does the patient have difficulty walking or climbing stairs?: Yes Weakness of Legs: Both Weakness of Arms/Hands: Both  Permission Sought/Granted                  Emotional Assessment Appearance:: Appears stated age Attitude/Demeanor/Rapport: Engaged Affect (typically observed): Appropriate Orientation: : Oriented to Self, Oriented to Place, Oriented to  Time, Oriented to Situation Alcohol / Substance Use: Never Used Psych Involvement: No (comment)  Admission diagnosis:  CLL (chronic lymphocytic leukemia) ([place]) [C91.10] HCAP (healthcare-associated pneumonia) [J18.9] Sepsis with acute renal failure without septic shock, due to unspecified organism, unspecified acute renal failure type ([place]) [A41.9, R65.20, N17.9]  Patient Active Problem List   Diagnosis Date Noted   HCAP (healthcare-associated pneumonia) 12/04/2022   AKI (acute kidney injury) ([place]) 12/04/2022   Hyperkalemia 12/04/2022   Hypocalcemia 12/04/2022   Pain due to onychomycosis of  toenails of both feet 10/17/2020   Chronic lymphoid leukemia (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 [place] [address] Phone: [phone] Fax: [phone]     Social Determinants of Health (SDOH) Social History: SDOH Screenings   Tobacco Use: Low Risk  (12/04/2022)   SDOH Interventions:     Readmission Risk Interventions   No data to display

## 2022-12-06 DIAGNOSIS — C9110 Chronic lymphocytic leukemia of B-cell type not having achieved remission: Secondary | ICD-10-CM | POA: Diagnosis not present

## 2022-12-06 DIAGNOSIS — J189 Pneumonia, unspecified organism: Secondary | ICD-10-CM | POA: Diagnosis not present

## 2022-12-06 DIAGNOSIS — N179 Acute kidney failure, unspecified: Secondary | ICD-10-CM | POA: Diagnosis not present

## 2022-12-06 DIAGNOSIS — E875 Hyperkalemia: Secondary | ICD-10-CM | POA: Diagnosis not present

## 2022-12-06 LAB — CBC WITH DIFFERENTIAL/PLATELET
Abs Immature Granulocytes: 0.49 10*3/uL — ABNORMAL HIGH (ref 0.00–0.07)
Basophils Absolute: 0.1 10*3/uL (ref 0.0–0.1)
Basophils Relative: 0 %
Eosinophils Absolute: 0.1 10*3/uL (ref 0.0–0.5)
Eosinophils Relative: 0 %
HCT: 34.4 % — ABNORMAL LOW (ref 39.0–52.0)
Hemoglobin: 9.8 g/dL — ABNORMAL LOW (ref 13.0–17.0)
Immature Granulocytes: 0 %
Lymphocytes Relative: 91 %
Lymphs Abs: 104 10*3/uL — ABNORMAL HIGH (ref 0.7–4.0)
MCH: 26.1 pg (ref 26.0–34.0)
MCHC: 28.5 g/dL — ABNORMAL LOW (ref 30.0–36.0)
MCV: 91.7 fL (ref 80.0–100.0)
Monocytes Absolute: 2 10*3/uL — ABNORMAL HIGH (ref 0.1–1.0)
Monocytes Relative: 2 %
Neutro Abs: 8.3 10*3/uL — ABNORMAL HIGH (ref 1.7–7.7)
Neutrophils Relative %: 7 %
Platelets: 191 10*3/uL (ref 150–400)
RBC: 3.75 MIL/uL — ABNORMAL LOW (ref 4.22–5.81)
RDW: 15.7 % — ABNORMAL HIGH (ref 11.5–15.5)
WBC Morphology: ABNORMAL
WBC: 114.9 10*3/uL (ref 4.0–10.5)
nRBC: 0.1 % (ref 0.0–0.2)

## 2022-12-06 LAB — COMPREHENSIVE METABOLIC PANEL
ALT: 29 U/L (ref 0–44)
AST: 28 U/L (ref 15–41)
Albumin: 3 g/dL — ABNORMAL LOW (ref 3.5–5.0)
Alkaline Phosphatase: 56 U/L (ref 38–126)
Anion gap: 8 (ref 5–15)
BUN: 46 mg/dL — ABNORMAL HIGH (ref 8–23)
CO2: 25 mmol/L (ref 22–32)
Calcium: 8.6 mg/dL — ABNORMAL LOW (ref 8.9–10.3)
Chloride: 113 mmol/L — ABNORMAL HIGH (ref 98–111)
Creatinine, Ser: 1.69 mg/dL — ABNORMAL HIGH (ref 0.61–1.24)
GFR, Estimated: 44 mL/min — ABNORMAL LOW (ref >60)
Glucose, Bld: 125 mg/dL — ABNORMAL HIGH (ref 70–99)
Potassium: 4.4 mmol/L (ref 3.5–5.1)
Sodium: 146 mmol/L — ABNORMAL HIGH (ref 135–145)
Total Bilirubin: 0.3 mg/dL (ref 0.3–1.2)
Total Protein: 6.3 g/dL — ABNORMAL LOW (ref 6.5–8.1)

## 2022-12-06 LAB — IRON AND TIBC
Iron: 43 ug/dL — ABNORMAL LOW (ref 45–182)
Saturation Ratios: 19 % (ref 17.9–39.5)
TIBC: 232 ug/dL — ABNORMAL LOW (ref 250–450)
UIBC: 189 ug/dL

## 2022-12-06 LAB — GLUCOSE, CAPILLARY
Glucose-Capillary: 130 mg/dL — ABNORMAL HIGH (ref 70–99)
Glucose-Capillary: 129 mg/dL — ABNORMAL HIGH (ref 70–99)
Glucose-Capillary: 129 mg/dL — ABNORMAL HIGH (ref 70–99)
Glucose-Capillary: 176 mg/dL — ABNORMAL HIGH (ref 70–99)

## 2022-12-06 LAB — RETICULOCYTES
Immature Retic Fract: 26.5 % — ABNORMAL HIGH (ref 2.3–15.9)
RBC.: 3.78 MIL/uL — ABNORMAL LOW (ref 4.22–5.81)
Retic Count, Absolute: 30.6 10*3/uL (ref 19.0–186.0)
Retic Ct Pct: 0.8 % (ref 0.4–3.1)

## 2022-12-06 LAB — FERRITIN: Ferritin: 121 ng/mL (ref 24–336)

## 2022-12-06 LAB — VITAMIN B12: Vitamin B-12: 559 pg/mL (ref 180–914)

## 2022-12-06 LAB — FOLATE: Folate: 16.4 ng/mL (ref >5.9)

## 2022-12-06 MED ORDER — GLUCERNA SHAKE PO LIQD
237.0000 mL | Freq: Three times a day (TID) | ORAL | Status: DC
  Administered 2022-12-06 – 2022-12-10 (×7): 237 mL via ORAL
  Filled 2022-12-06 (×18): qty 237

## 2022-12-06 MED ORDER — INSULIN GLARGINE-YFGN 100 UNIT/ML ~~LOC~~ SOLN
10.0000 [IU] | Freq: Two times a day (BID) | SUBCUTANEOUS | Status: DC
  Administered 2022-12-06 – 2022-12-12 (×13): 10 [IU] via SUBCUTANEOUS
  Filled 2022-12-06 (×14): qty 0.1

## 2022-12-06 MED ORDER — ALBUTEROL SULFATE (2.5 MG/3ML) 0.083% IN NEBU
2.5000 mg | INHALATION_SOLUTION | Freq: Three times a day (TID) | RESPIRATORY_TRACT | Status: DC
  Administered 2022-12-06 – 2022-12-09 (×9): 2.5 mg via RESPIRATORY_TRACT
  Filled 2022-12-06 (×10): qty 3

## 2022-12-06 NOTE — Plan of Care (Signed)
  Problem: Clinical Measurements: Goal: Signs and symptoms of infection will decrease Outcome: Progressing   Problem: Respiratory: Goal: Ability to maintain adequate ventilation will improve Outcome: Progressing   Problem: Coping: Goal: Ability to adjust to condition or change in health will improve Outcome: Progressing   Problem: Skin Integrity: Goal: Risk for impaired skin integrity will decrease Outcome: Progressing

## 2022-12-06 NOTE — Evaluation (Addendum)
Occupational Therapy Evaluation Patient Details Name: Cody [name] MRN: [id number] DOB: 1956-04-15 Today's Date: 12/06/2022   History of Present Illness Cody [name] is a 67 y.o. male with medical history significant of alcohol abuse, alcoholic cirrhosis, cataracts, type 2 diabetes, hyperlipidemia, gout who is being brought by his sister due to fever and worsening mental status with increased confusion. Patient admitted for pneumonia.   Clinical Impression   Mr. Karder Goodin is a 67 year old man who presents with lethargy, generalized weakness, decreased activity tolerance and impaired balance. He is found on 3.5 L [place]. He is drowsy and mostly keeping his eyes closed limiting evaluation. He is able to follow partial commands. His verbalizations are limited to "yeah," "no," or "okay" as immediate responses - unsure of accuracy. Unsure of patient's baseline. RN reports he lives with his mother and his sister monitors them both. Patient unable to answer PLOF questions. He was able to transfer with min assist and stand at side of bed with walker. He needed max - total assist for ADLs at edge of bed. He cannot feed himself yet. Patient will benefit from skilled OT services while in hospital to improve deficits and learn compensatory strategies as needed in order to return to PLOF.       Recommendations for follow up therapy are one component of a multi-disciplinary discharge planning process, led by the attending physician.  Recommendations may be updated based on patient status, additional functional criteria and insurance authorization.   Follow Up Recommendations  Skilled nursing-short term rehab (<3 hours/day)     Assistance Recommended at Discharge Frequent or constant Supervision/Assistance  Patient can return home with the following A lot of help with bathing/dressing/bathroom;Assistance with cooking/housework;A little help with walking and/or transfers;Direct  supervision/assist for financial management;Assist for transportation;Help with stairs or ramp for entrance;Direct supervision/assist for medications management    Functional Status Assessment  Patient has had a recent decline in their functional status and demonstrates the ability to make significant improvements in function in a reasonable and predictable amount of time.  Equipment Recommendations  None recommended by OT    Recommendations for Other Services       Precautions / Restrictions Precautions Precautions: Fall Restrictions Weight Bearing Restrictions: No      Mobility Bed Mobility Overal bed mobility: Needs Assistance Bed Mobility: Supine to Sit, Sit to Supine     Supine to sit: Min assist Sit to supine: Min assist   General bed mobility comments: Min assist to guide patient    Transfers Overall transfer level: Needs assistance Equipment used: Rolling walker (2 wheels) Transfers: Sit to/from Stand Sit to Stand: Min assist           General transfer comment: Min assist to stand to steady and for tactile cues to take steps to head of bed holding onto walker      Balance Overall balance assessment: Needs assistance Sitting-balance support: No upper extremity supported, Feet supported Sitting balance-Leahy Scale: Fair     Standing balance support: During functional activity, Reliant on assistive device for balance Standing balance-Leahy Scale: Poor                             ADL either performed or assessed with clinical judgement   ADL Overall ADL's : Needs assistance/impaired Eating/Feeding: Total assistance   Grooming: Maximal assistance;Sitting   Upper Body Bathing: Maximal assistance;Sitting   Lower Body Bathing: Total assistance;Sit to/from stand  Upper Body Dressing : Maximal assistance;Sitting   Lower Body Dressing: Total assistance;Sit to/from stand   Toilet Transfer: Minimal assistance;BSC/3in1 [employer] technical [employer]  Details (indicate cue type and reason): Rn reports he was able to ambulate to bathroom earlier in the day but he was only able to stand and take sidesteps wtih therapist during evaluation due to drowsiness Toileting- Clothing Manipulation and Hygiene: Total assistance;Sit to/from stand       Functional mobility during ADLs: Minimal assistance       Vision   Vision Assessment?: No apparent visual deficits     Perception     Praxis      Pertinent Vitals/Pain Pain Assessment Pain Assessment: Faces Faces Pain Scale: No hurt     Hand Dominance     Extremity/Trunk Assessment Upper Extremity Assessment Upper Extremity Assessment: Generalized weakness   Lower Extremity Assessment Lower Extremity Assessment: Defer to PT evaluation   Cervical / Trunk Assessment Cervical / Trunk Assessment: Normal   Communication Communication Communication: Other (comment) (minimal verbalizations - 1-2 word responses only)   Cognition Arousal/Alertness: Lethargic Behavior During Therapy: WFL for tasks assessed/performed Overall Cognitive Status: No family/caregiver present to determine baseline cognitive functioning                                 General Comments: Follows partial commands. Sticks to 1-2 to word responses. Limited by drowsiness     General Comments       Exercises     Shoulder Instructions      Home Living Family/patient expects to be discharged to:: Unsure Living Arrangements: Parent                               Additional Comments: Has a sister that monitors mother and brother via camera. Unsure of physical assistance provided.      Prior Functioning/Environment               Mobility Comments: unsure - maybe uses a walker. Stated "yeah" but he provided unreliable responses. ADLs Comments: unsure        OT Problem List: Decreased strength;Decreased activity tolerance;Impaired balance (sitting and/or standing);Decreased  cognition;Decreased safety awareness;Decreased knowledge of use of DME or AE      OT Treatment/Interventions: Self-care/ADL training;DME and/or AE instruction;Therapeutic activities;Balance training;Patient/family education;Therapeutic exercise    OT Goals(Current goals can be found in the care plan section) Acute Rehab OT Goals OT Goal Formulation: Patient unable to participate in goal setting Time For Goal Achievement: 12/20/22 Potential to Achieve Goals: Fair ADL Goals Pt Will Perform Lower Body Dressing: with supervision;sit to/from stand Pt Will Transfer to Toilet: with supervision;ambulating;regular height toilet;grab bars;bedside commode Pt Will Perform Toileting - Clothing Manipulation and hygiene: with supervision;sit to/from stand Additional ADL Goal #1: Patient will stand at sink to perform grooming task as evidence of improving activity tolerance  OT Frequency: Min 2X/week    Co-evaluation              AM-PAC OT "6 Clicks" Daily Activity     Outcome Measure Help from another person eating meals?: Total Help from another person taking care of personal grooming?: A Lot Help from another person toileting, which includes using toliet, bedpan, or urinal?: Total Help from another person bathing (including washing, rinsing, drying)?: A Lot Help from another person to put on and taking off regular upper body clothing?: A  Lot Help from another person to put on and taking off regular lower body clothing?: Total 6 Click Score: 9   End of Session Equipment Utilized During Treatment: Rolling walker (2 wheels);Oxygen Nurse Communication: Mobility status  Activity Tolerance: Patient tolerated treatment well Patient left: in bed;with call bell/phone within reach;with bed alarm set  OT Visit Diagnosis: Unsteadiness on feet (R26.81);Muscle weakness (generalized) (M62.81)                Time: 1368-5992 OT Time Calculation (min): 14 min Charges:  OT General Charges $OT Visit: 1  Visit OT Evaluation $OT Eval Low Complexity: 1 Low  Gustavo Lah, OTR/L [place]  Office [phone]   Lenward Chancellor 12/06/2022, 3:29 PM

## 2022-12-06 NOTE — Progress Notes (Signed)
Initial Nutrition Assessment  DOCUMENTATION CODES:   Non-severe (moderate) malnutrition in context of chronic illness  INTERVENTION:  - Add Glucerna Shake po TID, each supplement provides 220 kcal and 10 grams of protein  NUTRITION DIAGNOSIS:   Moderate Malnutrition related to chronic illness as evidenced by energy intake < or equal to 75% for > or equal to 1 month, percent weight loss.  GOAL:   Patient will meet greater than or equal to 90% of their needs  MONITOR:   PO intake, Supplement acceptance  REASON FOR ASSESSMENT:   Malnutrition Screening Tool    ASSESSMENT:   67 y.o. male admits related to AMS and abnormal lab. PMH includes: alcohol abuse, alcoholic cirrhosis, C7EL, HLD, gout. Pt is currently receiving medical management related to HCAP.  Meds reviewed: lipitor, folic acid, sliding scale insulin, MVI, thiamine. Labs reviewed: Na high, BUN/Creatinine elevated.   RD attempted to call pt's room but no answer. Per record, pt has eaten 50-100% of his meals since admission. Per record, pt has experienced a 12.2% wt loss in 6 months which is significant. Pt reported decreased appetite upon admission. RD will add Glucerna TID. Will continue to monitor PO intakes.   NUTRITION - FOCUSED PHYSICAL EXAM:  Unable to assess due to remote assessment.   Diet Order:   Diet Order             Diet Heart Room service appropriate? Yes; Fluid consistency: Thin  Diet effective now                   EDUCATION NEEDS:   Not appropriate for education at this time  Skin:  Skin Assessment: Reviewed RN Assessment  Last BM:  unknown  Height:   Ht Readings from Last 1 Encounters:  12/05/22 6' (1.829 m)    Weight:   Wt Readings from Last 1 Encounters:  12/05/22 90.5 kg    Ideal Body Weight:     BMI:  Body mass index is 27.06 kg/m.  Estimated Nutritional Needs:   Kcal:  3810-1751 kcals  Protein:  115-135 gm  Fluid:  >/= 2.2 L  Thalia Bloodgood, RD, LDN, CNSC.

## 2022-12-06 NOTE — Progress Notes (Signed)
[facility]                                                                               Cody [name], is a 67 y.o. male, DOB - Apr 08, 1956, NGE:[id number] Admit date - 12/04/2022    Outpatient Primary MD for the patient is [name], MD  LOS - 2  days    Brief summary    Cody [name] is a 67 y.o. male with medical history significant of alcohol abuse, alcoholic cirrhosis, cataracts, type 2 diabetes, hyperlipidemia, gout who is being brought by his sister due to fever and worsening mental status with increased confusion.  He had to be helped by 2 persons this morning so he can get changed to come to the emergency department. The patient had a URI symptoms on December 26 and was subsequently diagnosed with pneumonia last week.    Portable 1 view chest radiograph with subtle right lung base opacity possible  Infiltrate.  Follow-up recommended.     Assessment & Plan    Assessment and Plan:   Healthcare associated pneumonia Patient was admitted for IV antibiotics, he was started on IV cefepime and vancomycin. MRSA PCR is negative, DC vancomycin. Continue with cefepime.  Follow blood cultures. Obtain sputum cultures. Nasal cannula oxygen to keep sats greater than 90%. Wean off oxygen in the next 24 hours.      Acute kidney injury probably secondary to dehydration Holding ARB and ACE and diuretics at this time. He was admitted with a creatinine of 2.38 was started on IV fluids and creatinine has improved to 1.8 to 1.69.     -Hyperkalemia  Lokelma ordered  Potassium normalized.      Hypernatremia probably secondary to free water deficit Hydrate and recheck sodium improved from 150 to 146.    Anemia of chronic disease Hemoglobin between 10-11 suspect hemoconcentrated sample due to dehydration.  Continue to monitor    Diabetes mellitus Continue with sliding scale insulin. CBG (last 3)  Recent Labs    12/05/22 2128 12/06/22 0743  12/06/22 1228  GLUCAP 160* 130* 129*   A1c is 6.1%    Hypocalcemia Corrected calcium level is greater than 9.    History of CLL Recommend outpatient follow-up with oncology at this time.    Hyperlipidemia Continue with Lipitor 10 mg daily    Alcohol use disorder  Will start the patient on CIWA.   Estimated body mass index is 27.06 kg/m as calculated from the following:   Height as of this encounter: 6' (1.829 m).   Weight as of this encounter: 90.5 kg.  Code Status: full code.  DVT Prophylaxis:  enoxaparin (LOVENOX) injection 40 mg Start: 12/04/22 1115   Level of Care: Level of care: Progressive Family Communication: none at bedside.   Disposition Plan:     Remains inpatient appropriate:  alcohol withdrawal, hyperkalemia and AKI.   Procedures:  None.   Consultants:   None.   Antimicrobials:   Anti-infectives (From admission, onward)    Start     Dose/Rate Route Frequency Ordered Stop   12/05/22 2200  vancomycin (VANCOREADY) IVPB 1250 mg/250 mL  Status:  Discontinued  1,250 mg 166.7 mL/hr over 90 Minutes Intravenous Every 36 hours 12/04/22 1212 12/05/22 0903   12/04/22 2200  ceFEPIme (MAXIPIME) 2 g in sodium chloride 0.9 % 100 mL IVPB        2 g 200 mL/hr over 30 Minutes Intravenous Every 12 hours 12/04/22 1121 12/11/22 2159   12/04/22 0900  vancomycin (VANCOCIN) IVPB 1000 mg/200 mL premix  Status:  Discontinued        1,000 mg 200 mL/hr over 60 Minutes Intravenous  Once 12/04/22 0853 12/04/22 0857   12/04/22 0900  ceFEPIme (MAXIPIME) 2 g in sodium chloride 0.9 % 100 mL IVPB        2 g 200 mL/hr over 30 Minutes Intravenous  Once 12/04/22 0853 12/04/22 0951   12/04/22 0900  vancomycin (VANCOREADY) IVPB 2000 mg/400 mL        2,000 mg 200 mL/hr over 120 Minutes Intravenous STAT 12/04/22 0857 12/04/22 1201        Medications  Scheduled Meds:  albuterol  2.5 mg Nebulization TID   allopurinol  300 mg Oral Daily   amLODipine  10 mg Oral Daily    atorvastatin  10 mg Oral Daily   enoxaparin (LOVENOX) injection  40 mg Subcutaneous Daily   folic acid  1 mg Oral Daily   insulin aspart  0-9 Units Subcutaneous TID WC   loratadine  10 mg Oral QHS   multivitamin with minerals  1 tablet Oral Daily   thiamine  100 mg Oral Daily   Or   thiamine  100 mg Intravenous Daily   Continuous Infusions:  ceFEPime (MAXIPIME) IV 2 g (12/06/22 1043)   PRN Meds:.acetaminophen **OR** acetaminophen, albuterol, LORazepam **OR** LORazepam, ondansetron **OR** ondansetron (ZOFRAN) IV    Subjective:   Cody [name] was seen and examined today.  No new complaints.   Objective:   Vitals:   12/06/22 0538 12/06/22 0554 12/06/22 0815 12/06/22 1040  BP:  128/67  120/70  Pulse:  66  83  Resp: 16 18    Temp:  98.2 F (36.8 C)    TempSrc:  Oral    SpO2: 92% 94% 95%   Weight:      Height:        Intake/Output Summary (Last 24 hours) at 12/06/2022 1403 Last data filed at 12/06/2022 1353 Gross per 24 hour  Intake 240 ml  Output 2280 ml  Net -2040 ml    Filed Weights   12/05/22 0801  Weight: 90.5 kg     Exam General exam: Appears calm and comfortable  Respiratory system: Clear to auscultation. Respiratory effort normal. Cardiovascular system: S1 & S2 heard, RRR. No JVD, No pedal edema. Gastrointestinal system: Abdomen is nondistended, soft and nontender.  Central nervous system: Alert and oriented to person only. .  Extremities: no pedal edema.  Skin: No rashes, Psychiatry: patient is calm without any agitation.    Data Reviewed:  I have personally reviewed following labs and imaging studies   CBC Lab Results  Component Value Date   WBC 114.9 (HH) 12/06/2022   RBC 3.75 (L) 12/06/2022   HGB 9.8 (L) 12/06/2022   HCT 34.4 (L) 12/06/2022   MCV 91.7 12/06/2022   MCH 26.1 12/06/2022   PLT 191 12/06/2022   MCHC 28.5 (L) 12/06/2022   RDW 15.7 (H) 12/06/2022   LYMPHSABS 104.0 (H) 12/06/2022   MONOABS 2.0 (H) 12/06/2022   EOSABS  0.1 12/06/2022   BASOSABS 0.1 25/03/3975     Last metabolic panel Lab Results  Component Value Date   NA 146 (H) 12/06/2022   K 4.4 12/06/2022   CL 113 (H) 12/06/2022   CO2 25 12/06/2022   BUN 46 (H) 12/06/2022   CREATININE 1.69 (H) 12/06/2022   GLUCOSE 125 (H) 12/06/2022   GFRNONAA 44 (L) 12/06/2022   GFRAA 51 (L) 05/03/2020   CALCIUM 8.6 (L) 12/06/2022   PROT 6.3 (L) 12/06/2022   ALBUMIN 3.0 (L) 12/06/2022   BILITOT 0.3 12/06/2022   ALKPHOS 56 12/06/2022   AST 28 12/06/2022   ALT 29 12/06/2022   ANIONGAP 8 12/06/2022    CBG (last 3)  Recent Labs    12/05/22 2128 12/06/22 0743 12/06/22 1228  GLUCAP 160* 130* 129*       Coagulation Profile: Recent Labs  Lab 12/04/22 0916 12/04/22 1328  INR 1.2 1.3*      Radiology Studies: No results found.     Hosie Poisson M.D. [facility] 12/06/2022, 2:03 PM  Available via Epic secure chat 7am-7pm After 7 pm, please refer to night coverage provider listed on amion.

## 2022-12-06 NOTE — Progress Notes (Signed)
   12/06/22 0745  Provider Notification  Provider Name/Title Hosie Poisson, MD  Date Provider Notified 12/06/22  Time Provider Notified 0745  Method of Notification Page  Notification Reason Critical Result (WBC - 114.9)  Test performed and critical result WBC 114.9  Date Critical Result Received 12/06/22  Time Critical Result Received 0745  Provider response Other (Comment) (MD responded with phone call no new orders at this time.)  Date of Provider Response 12/06/22  Time of Provider Response [phone]

## 2022-12-07 ENCOUNTER — Inpatient Hospital Stay (HOSPITAL_COMMUNITY): Payer: Medicare PPO

## 2022-12-07 DIAGNOSIS — E875 Hyperkalemia: Secondary | ICD-10-CM | POA: Diagnosis not present

## 2022-12-07 DIAGNOSIS — N179 Acute kidney failure, unspecified: Secondary | ICD-10-CM | POA: Diagnosis not present

## 2022-12-07 DIAGNOSIS — J189 Pneumonia, unspecified organism: Secondary | ICD-10-CM | POA: Diagnosis not present

## 2022-12-07 DIAGNOSIS — C9110 Chronic lymphocytic leukemia of B-cell type not having achieved remission: Secondary | ICD-10-CM | POA: Diagnosis not present

## 2022-12-07 DIAGNOSIS — E440 Moderate protein-calorie malnutrition: Secondary | ICD-10-CM | POA: Insufficient documentation

## 2022-12-07 LAB — CBC WITH DIFFERENTIAL/PLATELET
Abs Immature Granulocytes: 0.7 10*3/uL — ABNORMAL HIGH (ref 0.00–0.07)
Basophils Absolute: 0.1 10*3/uL (ref 0.0–0.1)
Basophils Relative: 0 %
Eosinophils Absolute: 0.3 10*3/uL (ref 0.0–0.5)
Eosinophils Relative: 0 %
HCT: 39.3 % (ref 39.0–52.0)
Hemoglobin: 11.2 g/dL — ABNORMAL LOW (ref 13.0–17.0)
Immature Granulocytes: 1 %
Lymphocytes Relative: 90 %
Lymphs Abs: 109.2 10*3/uL — ABNORMAL HIGH (ref 0.7–4.0)
MCH: 26.7 pg (ref 26.0–34.0)
MCHC: 28.5 g/dL — ABNORMAL LOW (ref 30.0–36.0)
MCV: 93.6 fL (ref 80.0–100.0)
Monocytes Absolute: 1.9 10*3/uL — ABNORMAL HIGH (ref 0.1–1.0)
Monocytes Relative: 2 %
Neutro Abs: 8 10*3/uL — ABNORMAL HIGH (ref 1.7–7.7)
Neutrophils Relative %: 7 %
Platelets: 216 10*3/uL (ref 150–400)
RBC: 4.2 MIL/uL — ABNORMAL LOW (ref 4.22–5.81)
RDW: 15.4 % (ref 11.5–15.5)
WBC Morphology: ABNORMAL
WBC: 120.2 10*3/uL (ref 4.0–10.5)
nRBC: 0.2 % (ref 0.0–0.2)

## 2022-12-07 LAB — COMPREHENSIVE METABOLIC PANEL
ALT: 32 U/L (ref 0–44)
AST: 34 U/L (ref 15–41)
Albumin: 3.2 g/dL — ABNORMAL LOW (ref 3.5–5.0)
Alkaline Phosphatase: 67 U/L (ref 38–126)
Anion gap: 9 (ref 5–15)
BUN: 33 mg/dL — ABNORMAL HIGH (ref 8–23)
CO2: 25 mmol/L (ref 22–32)
Calcium: 8.9 mg/dL (ref 8.9–10.3)
Chloride: 113 mmol/L — ABNORMAL HIGH (ref 98–111)
Creatinine, Ser: 1.54 mg/dL — ABNORMAL HIGH (ref 0.61–1.24)
GFR, Estimated: 49 mL/min — ABNORMAL LOW (ref >60)
Glucose, Bld: 116 mg/dL — ABNORMAL HIGH (ref 70–99)
Potassium: 4 mmol/L (ref 3.5–5.1)
Sodium: 147 mmol/L — ABNORMAL HIGH (ref 135–145)
Total Bilirubin: 0.7 mg/dL (ref 0.3–1.2)
Total Protein: 6.6 g/dL (ref 6.5–8.1)

## 2022-12-07 LAB — GLUCOSE, CAPILLARY
Glucose-Capillary: 105 mg/dL — ABNORMAL HIGH (ref 70–99)
Glucose-Capillary: 193 mg/dL — ABNORMAL HIGH (ref 70–99)
Glucose-Capillary: 114 mg/dL — ABNORMAL HIGH (ref 70–99)
Glucose-Capillary: 183 mg/dL — ABNORMAL HIGH (ref 70–99)

## 2022-12-07 MED ORDER — ORAL CARE MOUTH RINSE: 15.0000 mL | OROMUCOSAL | Status: DC | PRN

## 2022-12-07 MED ORDER — DEXTROSE 5 % IV SOLN: INTRAVENOUS | Status: DC

## 2022-12-07 MED ORDER — PIPERACILLIN-TAZOBACTAM 3.375 G IVPB
3.3750 g | Freq: Three times a day (TID) | INTRAVENOUS | Status: DC
  Administered 2022-12-07 – 2022-12-10 (×9): 3.375 g via INTRAVENOUS
  Filled 2022-12-07 (×9): qty 50

## 2022-12-07 NOTE — Evaluation (Signed)
Physical Therapy Evaluation Patient Details Name: Cody [name] MRN: [id number] DOB: November 29, 1955 Today's Date: 12/07/2022  History of Present Illness  Cody [name] is a 67 y.o. male with medical history significant of alcohol abuse, alcoholic cirrhosis, cataracts, type 2 diabetes, hyperlipidemia, gout who is being brought by his sister due to fever and worsening mental status with increased confusion. Patient admitted for pneumonia.  Clinical Impression  Pt admitted with above diagnosis.  Pt currently with functional limitations due to the deficits listed below (see PT Problem List). Pt will benefit from skilled PT to increase their independence and safety with mobility to allow discharge to the venue listed below.  Pt's family friend was in room and finishing feeding pt on therapist arrival to room.  Pt began drinking too fast despite cues for slowing down and had coughing spell with contents coughed out of mouth and nose.  Coughing lasted for at least 5 minutes and pt's RN came into room.  Pt assisted to sitting EOB until feeling better.  Pt then stood at [facility] and took a couple small steps up [facility] however declined further activity today.  RN and PT assisted pt with return to bed and repositioning upright.  Pt will likely need to be ambulatory and more independent in order for d/c home (which would also be best cognitively as well) however uncertain of how fast pt will progress so current recommendations are for SNF unless family can provide assist.        Recommendations for follow up therapy are one component of a multi-disciplinary discharge planning process, led by the attending physician.  Recommendations may be updated based on patient status, additional functional criteria and insurance authorization.  Follow Up Recommendations Skilled nursing-short term rehab (<3 hours/day) Can patient physically be transported by private vehicle: No    Assistance Recommended at Discharge  Frequent or constant Supervision/Assistance  Patient can return home with the following  A lot of help with walking and/or transfers;A lot of help with bathing/dressing/bathroom;Assistance with feeding;Assistance with cooking/housework;Help with stairs or ramp for entrance;Assist for transportation    Equipment Recommendations Rolling walker (2 wheels)  Recommendations for Other Services       Functional Status Assessment Patient has had a recent decline in their functional status and demonstrates the ability to make significant improvements in function in a reasonable and predictable amount of time.     Precautions / Restrictions Precautions Precautions: Fall      Mobility  Bed Mobility Overal bed mobility: Needs Assistance Bed Mobility: Supine to Sit, Sit to Supine     Supine to sit: Min assist Sit to supine: Min assist   General bed mobility comments: min assist for guiding patient    Transfers Overall transfer level: Needs assistance Equipment used: 2 person hand held assist Transfers: Sit to/from Stand Sit to Stand: Min assist           General transfer comment: assist to rise and steady, provided HHA for stability, pt requiring time and cues but able to take a couple steps up [facility] (RN present)    Ambulation/Gait               General Gait Details: pt not agreeable today  Stairs            Wheelchair Mobility    Modified Rankin (Stroke Patients Only)       Balance Overall balance assessment: Needs assistance         Standing balance support: During functional  activity, Bilateral upper extremity supported Standing balance-Leahy Scale: Poor                               Pertinent Vitals/Pain Pain Assessment Pain Assessment: Faces Faces Pain Scale: No hurt Pain Intervention(s): Monitored during session, Repositioned    Home Living Family/patient expects to be discharged to:: Private residence Living Arrangements:  Parent;Other relatives (sister)     Home Access: Level entry       Home Layout: One level Home Equipment: None      Prior Function Prior Level of Function : Independent/Modified Independent             Mobility Comments: family friend present and reports pt is typically independent and does not use assistive device, he does require assist for prepping meals       Hand Dominance        Extremity/Trunk Assessment   Upper Extremity Assessment Upper Extremity Assessment: Generalized weakness    Lower Extremity Assessment Lower Extremity Assessment: Generalized weakness    Cervical / Trunk Assessment Cervical / Trunk Assessment: Normal  Communication   Communication: Receptive difficulties;Expressive difficulties (childlike behavior and responses, short answers)  Cognition Arousal/Alertness: Awake/alert Behavior During Therapy: WFL for tasks assessed/performed Overall Cognitive Status: History of cognitive impairments - at baseline                                 General Comments: no formal history of cognitive impairment in chart however pt presents with childlike behavior and responds with short word answers        General Comments      Exercises     Assessment/Plan    PT Assessment Patient needs continued PT services  PT Problem List Decreased mobility;Decreased balance;Decreased activity tolerance;Decreased strength;Decreased knowledge of use of DME       PT Treatment Interventions DME instruction;Balance training;Gait training;Therapeutic exercise;Functional mobility training;Therapeutic activities;Patient/family education    PT Goals (Current goals can be found in the Care Plan section)  Acute Rehab PT Goals PT Goal Formulation: With patient Time For Goal Achievement: 12/21/22 Potential to Achieve Goals: Fair    Frequency Min 2X/week     Co-evaluation               AM-PAC PT "6 Clicks" Mobility  Outcome Measure Help  needed turning from your back to your side while in a flat bed without using bedrails?: A Little Help needed moving from lying on your back to sitting on the side of a flat bed without using bedrails?: A Lot Help needed moving to and from a bed to a chair (including a wheelchair)?: A Lot Help needed standing up from a chair using your arms (e.g., wheelchair or bedside chair)?: A Lot Help needed to walk in hospital room?: A Lot Help needed climbing 3-5 steps with a railing? : Total 6 Click Score: 12    End of Session   Activity Tolerance: Patient tolerated treatment well Patient left: in bed;with call bell/phone within reach;with family/visitor present Nurse Communication: Mobility status PT Visit Diagnosis: Difficulty in walking, not elsewhere classified (R26.2);Muscle weakness (generalized) (M62.81)    Time: 5284-1324 PT Time Calculation (min) (ACUTE ONLY): 22 min   Charges:   PT Evaluation $PT Eval Moderate Complexity: 1 Mod        Kati PT, DPT Physical Therapist Acute Rehabilitation Services Preferred contact method:  Secure Chat Weekend Pager Only: [phone] Office: [place] 12/07/2022, 1:42 PM

## 2022-12-07 NOTE — Progress Notes (Addendum)
Pharmacy Antibiotic Note  Cody [name] is a 67 y.o. male admitted on 12/04/2022 with sepsis.  History of recent diagnosis of pneumonia and on 1/4 was prescribed Augmentin and azithromycin.  Pharmacy has been consulted for cefepime.  Plan: Continue cefepime 2 g IV every 12 hours through 1/18 (7 days) Monitor clinical progress, renal function, vancomycin levels as indicated  Height: 6' (182.9 cm) Weight: 90.5 kg (199 lb 8.3 oz) IBW/kg (Calculated) : 77.6  Temp (24hrs), Avg:98 F (36.7 C), Min:97.4 F (36.3 C), Max:98.7 F (37.1 C)  Recent Labs  Lab 12/04/22 0916 12/04/22 1320 12/05/22 0517 12/05/22 1801 12/06/22 0640 12/07/22 0553  WBC 138.7*  --  119.1*  --  114.9* 120.2*  CREATININE 2.38*  --  1.81* 1.77* 1.69* 1.54*  LATICACIDVEN 0.9 0.6  --   --   --   --      Estimated Creatinine Clearance: 50.4 mL/min (A) (by C-G formula based on SCr of 1.54 mg/dL (H)).   Last documented height and weight: 06/02/2022 ht 72 inches and wt 103.1 kg Est CrCl using 06/02/2022 ht and wt = 37 ml/min  Allergies  Allergen Reactions   Rituxan [Rituximab] Other (See Comments)    "Shaking all over and diaphoretic"    Antimicrobials this admission:  1/11 Vanc >> 1/12 1/11 Cefepime >> (1/18)  Dose adjustments this admission:   Microbiology results:  1/11 MRSA PCR: neg 1/11 UCx: NGF 1/11 BCx: ngtd   Thank you for allowing pharmacy to be a part of this patient's care.  Peggyann Juba, PharmD, BCPS Pharmacy: [phone] 12/07/2022 10:38 AM  Addendum: changing to Zosyn for aspiration. Plan: Zosyn 3.375gm IV q8h (4hr extended infusions) No dose adjustments anticipated.  Pharmacy will sign off and monitor peripherally via electronic surveillance software for any changes in renal function or micro data.   Peggyann Juba, PharmD, BCPS 12/07/2022 4:46 PM

## 2022-12-07 NOTE — Progress Notes (Signed)
[facility]                                                                               Cody [name], is a 67 y.o. male, DOB - 1956-04-18, UXL:[id number] Admit date - 12/04/2022    Outpatient Primary MD for the patient is Cody Beard, MD  LOS - 3  days    Brief summary    Cody [name] is a 68 y.o. male with medical history significant of alcohol abuse, alcoholic cirrhosis, cataracts, type 2 diabetes, hyperlipidemia, gout who is being brought by his sister due to fever and worsening mental status with increased confusion.  He had to be helped by 2 persons this morning so he can get changed to come to the emergency department. The patient had a URI symptoms on December 26 and was subsequently diagnosed with pneumonia last week.    Portable 1 view chest radiograph with subtle right lung base opacity possible  Infiltrate.  Follow-up recommended.     Assessment & Plan    Assessment and Plan:   Healthcare associated pneumonia Chest x-ray initially showed subtle right lung base opacity. Patient was admitted for IV antibiotics, he was started on IV cefepime and vancomycin. MRSA PCR is negative, DC vancomycin.  Patient received 2 days of IV cefepime but in view of his lethargy and encephalopathy changed IV cefepime to IV Zosyn. CT chest without contrast ordered to evaluate for worsening pneumonia.  Blood cultures negative so far Obtain sputum cultures. Nasal cannula oxygen to keep sats greater than 90%.  Patient continues to require up to 2 to 3 L of nasal cannula oxygen    Acute metabolic encephalopathy in the setting of recent with baseline cognitive deficits as per the patient's family Probably secondary to a combination of healthcare associated pneumonia and cefepime Will DC cefepime and start the patient on Zosyn  Acute kidney injury probably secondary to dehydration Holding ARB and ACE and diuretics at this time. Baseline creatinine appears to  be between 1.2-1.3 He was admitted with a creatinine of 2.38 was started on IV fluids and creatinine has improved to 1.8 to 1.69 to 1.5.     -Hyperkalemia  Lokelma ordered  Potassium normalized.      Hypernatremia probably secondary to free water deficit Hydrate and recheck sodium improved from 150 to 146.  Continue with dextrose fluids   Anemia of chronic disease Hemoglobin between 10-11 suspect hemoconcentrated sample due to dehydration.  Continue to monitor    Diabetes mellitus Continue with sliding scale insulin. CBG (last 3)  Recent Labs    12/06/22 2023 12/07/22 0734 12/07/22 1256  GLUCAP 176* 105* 193*    A1c is 6.1% no change in medications    Hypocalcemia Corrected calcium level is greater than 9.    History of CLL Recommend outpatient follow-up with oncology at this time.    Hyperlipidemia Continue with Lipitor 10 mg daily     Estimated body mass index is 27.06 kg/m as calculated from the following:   Height as of this encounter: 6' (1.829 m).   Weight as of this encounter: 90.5 kg.  Code Status: full code.  DVT Prophylaxis:  enoxaparin (  LOVENOX) injection 40 mg Start: 12/04/22 1115   Level of Care: Level of care: Progressive Family Communication: none at bedside.   Disposition Plan:     Remains inpatient appropriate:  alcohol withdrawal, hyperkalemia and AKI.   Procedures:  None.   Consultants:   None.   Antimicrobials:   Anti-infectives (From admission, onward)    Start     Dose/Rate Route Frequency Ordered Stop   12/05/22 2200  vancomycin (VANCOREADY) IVPB 1250 mg/250 mL  Status:  Discontinued        1,250 mg 166.7 mL/hr over 90 Minutes Intravenous Every 36 hours 12/04/22 1212 12/05/22 0903   12/04/22 2200  ceFEPIme (MAXIPIME) 2 g in sodium chloride 0.9 % 100 mL IVPB        2 g 200 mL/hr over 30 Minutes Intravenous Every 12 hours 12/04/22 1121 12/11/22 2159   12/04/22 0900  vancomycin (VANCOCIN) IVPB 1000 mg/200 mL  premix  Status:  Discontinued        1,000 mg 200 mL/hr over 60 Minutes Intravenous  Once 12/04/22 0853 12/04/22 0857   12/04/22 0900  ceFEPIme (MAXIPIME) 2 g in sodium chloride 0.9 % 100 mL IVPB        2 g 200 mL/hr over 30 Minutes Intravenous  Once 12/04/22 0853 12/04/22 0951   12/04/22 0900  vancomycin (VANCOREADY) IVPB 2000 mg/400 mL        2,000 mg 200 mL/hr over 120 Minutes Intravenous STAT 12/04/22 0857 12/04/22 1201        Medications  Scheduled Meds:  albuterol  2.5 mg Nebulization TID   allopurinol  300 mg Oral Daily   amLODipine  10 mg Oral Daily   atorvastatin  10 mg Oral Daily   enoxaparin (LOVENOX) injection  40 mg Subcutaneous Daily   feeding supplement (GLUCERNA SHAKE)  237 mL Oral TID BM   folic acid  1 mg Oral Daily   insulin aspart  0-9 Units Subcutaneous TID WC   insulin glargine-yfgn  10 Units Subcutaneous BID   loratadine  10 mg Oral QHS   multivitamin with minerals  1 tablet Oral Daily   thiamine  100 mg Oral Daily   Or   thiamine  100 mg Intravenous Daily   Continuous Infusions:  ceFEPime (MAXIPIME) IV 2 g (12/07/22 1013)   dextrose 75 mL/hr at 12/07/22 1134   PRN Meds:.acetaminophen **OR** acetaminophen, albuterol, ondansetron **OR** ondansetron (ZOFRAN) IV, mouth rinse    Subjective:   Issaac [name] was seen and examined today.  Patient more lethargic today and unable to wean him off the oxygen  Objective:   Vitals:   12/07/22 0806 12/07/22 1010 12/07/22 1342 12/07/22 1414  BP:  138/70 107/80   Pulse:  93 98   Resp:   (!) 21   Temp:   98.8 F (37.1 C)   TempSrc:   Oral   SpO2: 93%  (!) 88% 91%  Weight:      Height:        Intake/Output Summary (Last 24 hours) at 12/07/2022 1641 Last data filed at 12/07/2022 1508 Gross per 24 hour  Intake 864.35 ml  Output 1450 ml  Net -585.65 ml    Filed Weights   12/05/22 0801  Weight: 90.5 kg     Exam General exam: Elderly man, lethargic on 2 L of nasal cannula  oxygen Respiratory system: Diminished air entry at bases no wheezing or rhonchi Cardiovascular system: S1 & S2 heard, RRR. No JVD,  Gastrointestinal system: Abdomen is  nondistended, soft and nontender.  Central nervous system: Lethargic but opens eyes to answer few questions and work with physical therapy Extremities: No pedal edema Skin: No rashes,  Psychiatry: No agitation   Data Reviewed:  I have personally reviewed following labs and imaging studies   CBC Lab Results  Component Value Date   WBC 120.2 (HH) 12/07/2022   RBC 4.20 (L) 12/07/2022   HGB 11.2 (L) 12/07/2022   HCT 39.3 12/07/2022   MCV 93.6 12/07/2022   MCH 26.7 12/07/2022   PLT 216 12/07/2022   MCHC 28.5 (L) 12/07/2022   RDW 15.4 12/07/2022   LYMPHSABS 109.2 (H) 12/07/2022   MONOABS 1.9 (H) 12/07/2022   EOSABS 0.3 12/07/2022   BASOSABS 0.1 98/33/8250     Last metabolic panel Lab Results  Component Value Date   NA 147 (H) 12/07/2022   K 4.0 12/07/2022   CL 113 (H) 12/07/2022   CO2 25 12/07/2022   BUN 33 (H) 12/07/2022   CREATININE 1.54 (H) 12/07/2022   GLUCOSE 116 (H) 12/07/2022   GFRNONAA 49 (L) 12/07/2022   GFRAA 51 (L) 05/03/2020   CALCIUM 8.9 12/07/2022   PROT 6.6 12/07/2022   ALBUMIN 3.2 (L) 12/07/2022   BILITOT 0.7 12/07/2022   ALKPHOS 67 12/07/2022   AST 34 12/07/2022   ALT 32 12/07/2022   ANIONGAP 9 12/07/2022    CBG (last 3)  Recent Labs    12/06/22 2023 12/07/22 0734 12/07/22 1256  GLUCAP 176* 105* 193*       Coagulation Profile: Recent Labs  Lab 12/04/22 0916 12/04/22 1328  INR 1.2 1.3*      Radiology Studies: No results found.     Hosie Poisson M.D. [facility] 12/07/2022, 4:41 PM  Available via Epic secure chat 7am-7pm After 7 pm, please refer to night coverage provider listed on amion.

## 2022-12-08 DIAGNOSIS — J189 Pneumonia, unspecified organism: Secondary | ICD-10-CM | POA: Diagnosis not present

## 2022-12-08 DIAGNOSIS — N179 Acute kidney failure, unspecified: Secondary | ICD-10-CM | POA: Diagnosis not present

## 2022-12-08 DIAGNOSIS — C9110 Chronic lymphocytic leukemia of B-cell type not having achieved remission: Secondary | ICD-10-CM | POA: Diagnosis not present

## 2022-12-08 DIAGNOSIS — E875 Hyperkalemia: Secondary | ICD-10-CM | POA: Diagnosis not present

## 2022-12-08 LAB — CBC WITH DIFFERENTIAL/PLATELET
Abs Immature Granulocytes: 1.11 10*3/uL — ABNORMAL HIGH (ref 0.00–0.07)
Basophils Absolute: 0.1 10*3/uL (ref 0.0–0.1)
Basophils Relative: 0 %
Eosinophils Absolute: 0.2 10*3/uL (ref 0.0–0.5)
Eosinophils Relative: 0 %
HCT: 38.4 % — ABNORMAL LOW (ref 39.0–52.0)
Hemoglobin: 11.1 g/dL — ABNORMAL LOW (ref 13.0–17.0)
Immature Granulocytes: 1 %
Lymphocytes Relative: 87 %
Lymphs Abs: 120.6 10*3/uL — ABNORMAL HIGH (ref 0.7–4.0)
MCH: 26.2 pg (ref 26.0–34.0)
MCHC: 28.9 g/dL — ABNORMAL LOW (ref 30.0–36.0)
MCV: 90.6 fL (ref 80.0–100.0)
Monocytes Absolute: 3.4 10*3/uL — ABNORMAL HIGH (ref 0.1–1.0)
Monocytes Relative: 3 %
Neutro Abs: 12.4 10*3/uL — ABNORMAL HIGH (ref 1.7–7.7)
Neutrophils Relative %: 9 %
Platelets: 228 10*3/uL (ref 150–400)
RBC: 4.24 MIL/uL (ref 4.22–5.81)
RDW: 15.2 % (ref 11.5–15.5)
WBC Morphology: ABNORMAL
WBC: 137.8 10*3/uL (ref 4.0–10.5)
nRBC: 0.2 % (ref 0.0–0.2)

## 2022-12-08 LAB — BASIC METABOLIC PANEL
Anion gap: 9 (ref 5–15)
BUN: 35 mg/dL — ABNORMAL HIGH (ref 8–23)
CO2: 27 mmol/L (ref 22–32)
Calcium: 8.6 mg/dL — ABNORMAL LOW (ref 8.9–10.3)
Chloride: 109 mmol/L (ref 98–111)
Creatinine, Ser: 1.98 mg/dL — ABNORMAL HIGH (ref 0.61–1.24)
GFR, Estimated: 36 mL/min — ABNORMAL LOW (ref >60)
Glucose, Bld: 171 mg/dL — ABNORMAL HIGH (ref 70–99)
Potassium: 3.9 mmol/L (ref 3.5–5.1)
Sodium: 145 mmol/L (ref 135–145)

## 2022-12-08 LAB — GLUCOSE, CAPILLARY
Glucose-Capillary: 165 mg/dL — ABNORMAL HIGH (ref 70–99)
Glucose-Capillary: 101 mg/dL — ABNORMAL HIGH (ref 70–99)
Glucose-Capillary: 194 mg/dL — ABNORMAL HIGH (ref 70–99)
Glucose-Capillary: 174 mg/dL — ABNORMAL HIGH (ref 70–99)

## 2022-12-08 NOTE — Care Management Important Message (Signed)
Important Message  Patient Details IM Letter given. Name: Cody [name] MRN: [id number] Date of Birth: 1956-05-11   Medicare Important Message Given:  Yes     [name] 12/08/2022, 1:10 PM

## 2022-12-08 NOTE — Progress Notes (Signed)
   12/07/22 2003  Assess: MEWS Score  Temp 99.8 F (37.7 C)  BP (!) 156/77  MAP (mmHg) 99  Pulse Rate (!) 108  Resp (!) 22  SpO2 97 %  O2 Device Room Air  Assess: MEWS Score  MEWS Temp 0  MEWS Systolic 0  MEWS Pulse 1  MEWS RR 1  MEWS LOC 0  MEWS Score 2  MEWS Score Color Yellow  Assess: if the MEWS score is Yellow or Red  Were vital signs taken at a resting state? Yes  Focused Assessment Change from prior assessment (see assessment flowsheet)  Does the patient meet 2 or more of the SIRS criteria? Yes  Does the patient have a confirmed or suspected source of infection? Yes  Provider and Rapid Response Notified? No  MEWS guidelines implemented *See Row Information* Yes  Treat  MEWS Interventions Administered prn meds/treatments  Pain Scale Faces  Pain Score 0  Take Vital Signs  Increase Vital Sign Frequency  Yellow: Q 2hr X 2 then Q 4hr X 2, if remains yellow, continue Q 4hrs  Escalate  MEWS: Escalate Yellow: discuss with charge nurse/RN and consider discussing with provider and RRT  Notify: Charge Nurse/RN  Name of Charge Nurse/RN Notified Lauren, RN  Date Charge Nurse/RN Notified 12/07/22  Time Charge Nurse/RN Notified 2005  Provider Notification  Provider Name/Title Gershon Cull, NP  Date Provider Notified 12/07/22  Time Provider Notified 2009  Method of Notification Page (secure chat)  Notification Reason Other (Comment) (Yellow MEWS)  Provider response No new orders  Date of Provider Response 12/07/22  Time of Provider Response 2012  Document  Patient Outcome Stabilized after interventions  Assess: SIRS CRITERIA  SIRS Temperature  0  SIRS Pulse 1  SIRS Respirations  1  SIRS WBC 0  SIRS Score Sum  2   Yellow MEWS started  per protocol. Notified CN and attending on-call. PRN medication given before CT scan. Back to Green MEWS after medication intervention.   Around 0140: pt is more alert and asked for water for dry mouth. Pt drank 44m water and RN  cleaned pt's mouth with swab. Will continue to monitor.

## 2022-12-08 NOTE — Plan of Care (Signed)
  Problem: Respiratory: Goal: Ability to maintain adequate ventilation will improve Outcome: Progressing   Problem: Education: Goal: Knowledge of General Education information will improve Description: Including pain rating scale, medication(s)/side effects and non-pharmacologic comfort measures Outcome: Progressing   Problem: Coping: Goal: Level of anxiety will decrease Outcome: Progressing   Problem: Elimination: Goal: Will not experience complications related to urinary retention Outcome: Progressing   Problem: Pain Managment: Goal: General experience of comfort will improve Outcome: Progressing   Problem: Safety: Goal: Ability to remain free from injury will improve Outcome: Progressing   Problem: Skin Integrity: Goal: Risk for impaired skin integrity will decrease Outcome: Progressing

## 2022-12-08 NOTE — Progress Notes (Signed)
Speech Language Pathology Treatment: Dysphagia  Patient Details Name: Cody [name] MRN: [id number] DOB: 10-21-1956 Today's Date: 12/08/2022 Time: 1500-1510 SLP Time Calculation (min) (ACUTE ONLY): 10 min  Assessment / Plan / Recommendation Clinical Impression  Patient seen by SLP for skilled treatment focused on dysphagia goals. Per patient's RN, he seemed to do better with downgraded diet  (Dys 2, nectar thick liquids) than he had on Dys 3, thin liquids. She reported that patient's sister fed him at lunch time and sister also reported that at baseline she cuts up his food. Patient told SLP, "I already ate" but he when given a choice, he requested "ice water". SLP provided setup assist but patient then able to give self sips from cups, taking large gulping sips and exhibiting both immediate and delayed cough response. He requested a straw but SLP did not provide this secondary to him already being impulsive. SLP recommending continue with Dys 2, nectar thick liquids and SLP will continue to follow for diet toleration and ability to upgrade.    HPI HPI: Cody [name] is a 67 yo male adm to [facility] with AMS and increased confusion. Pt has PMH + for ETOH use, alcoholic cirrhosis, gout, recent URI - unable to ambulate without assist at home.  CXR concerning for potential Right Lobe infiltrate.  Swallow eval ordered. Pt denies issues with swallowing.      SLP Plan  Continue with current plan of care      Recommendations for follow up therapy are one component of a multi-disciplinary discharge planning process, led by the attending physician.  Recommendations may be updated based on patient status, additional functional criteria and insurance authorization.    Recommendations  Diet recommendations: Dysphagia 2 (fine chop);Nectar-thick liquid Liquids provided via: Cup;Straw Medication Administration: Whole meds with puree Supervision: Full supervision/cueing for compensatory strategies;Patient able to  self feed;Staff to assist with self feeding Compensations: Slow rate;Small sips/bites;Minimize environmental distractions Postural Changes and/or Swallow Maneuvers: Seated upright 90 degrees                Oral Care Recommendations: Oral care BID Follow Up Recommendations: Skilled nursing-short term rehab (<3 hours/day) Assistance recommended at discharge: Frequent or constant Supervision/Assistance SLP Visit Diagnosis: Dysphagia, unspecified (R13.10) Plan: Continue with current plan of care           Sonia Baller, MA, CCC-SLP Speech Therapy

## 2022-12-08 NOTE — Progress Notes (Signed)
Speech Language Pathology Treatment: Dysphagia  Patient Details Name: Cody [name] MRN: [id number] DOB: Jun 27, 1956 Today's Date: 12/08/2022 Time: 8828-0034 SLP Time Calculation (min) (ACUTE ONLY): 10 min  Assessment / Plan / Recommendation Clinical Impression  Patient seen by SLP for skilled treatment focused on dysphagia goals. SLP alerted to patient having observed coughing with PO's by PT and this was confirmed by RN. RN reports patient impulsive with PO intake and exhibited coughing after taking pills as well as when drinking liquids (water). Per evaluating SLP note, patient was impulsive during initial assessment as well. When SLP entered room, patient waving hands saying, "I don't want any of that" without knowing what SLP had brought. He appeared agitated and refused all PO's offered. Per RN, he was becoming agitated when she was giving him his morning medications. Patient still does not have his dentures and he reports that they are at his house. SLP observed his inspiratory breath sounds to have some crackles/slight wheeze. As patient continues to be impulsive with PO intake and has been exhibiting overt s/s of aspiration/penetration with PO intake, SLP recommending to downgrade diet to Dys 2 (minced), nectar thick liquids. SLP plans to return later this date to hopefully observe patient with PO intake.    HPI HPI: Cody [name] is a 67 yo male adm to [facility] with AMS and increased confusion. Pt has PMH + for ETOH use, alcoholic cirrhosis, gout, recent URI - unable to ambulate without assist at home.  CXR concerning for potential Right Lobe infiltrate.  Swallow eval ordered. Pt denies issues with swallowing.      SLP Plan  Continue with current plan of care      Recommendations for follow up therapy are one component of a multi-disciplinary discharge planning process, led by the attending physician.  Recommendations may be updated based on patient status, additional functional criteria and  insurance authorization.    Recommendations  Diet recommendations: Dysphagia 2 (fine chop);Nectar-thick liquid Liquids provided via: Cup;Straw Medication Administration: Whole meds with puree Supervision: Full supervision/cueing for compensatory strategies;Patient able to self feed Compensations: Slow rate;Small sips/bites;Minimize environmental distractions Postural Changes and/or Swallow Maneuvers: Seated upright 90 degrees                Oral Care Recommendations: Oral care BID Follow Up Recommendations: Other (comment) (TBD) Assistance recommended at discharge: Frequent or constant Supervision/Assistance SLP Visit Diagnosis: Dysphagia, unspecified (R13.10) Plan: Continue with current plan of care       Sonia Baller, MA, CCC-SLP Speech Therapy

## 2022-12-08 NOTE — Progress Notes (Signed)
[facility]                                                                               Cody [name], is a 67 y.o. male, DOB - 05-Jun-1956, BBC:[id number] Admit date - 12/04/2022    Outpatient Primary MD for the patient is [name], MD  LOS - 4  days    Brief summary    Cody [name] is a 67 y.o. male with medical history significant of alcohol abuse, alcoholic cirrhosis, cataracts, type 2 diabetes, hyperlipidemia, gout who is being brought by his sister due to fever and worsening mental status with increased confusion.  He had to be helped by 2 persons this morning so he can get changed to come to the emergency department. The patient had a URI symptoms on December 26 and was subsequently diagnosed with pneumonia last week.    Portable 1 view chest radiograph with subtle right lung base opacity possible  Infiltrate.  Follow-up recommended.      Assessment & Plan    Assessment and Plan:   Healthcare associated pneumonia Chest x-ray initially showed subtle right lung base opacity. Patient was admitted for IV antibiotics, he was started on IV cefepime and vancomycin. MRSA PCR is negative, DC vancomycin.  Patient received 2 days of IV cefepime but in view of his lethargy and encephalopathy changed IV cefepime to IV Zosyn. CT chest without contrast ordered showed bilateral basilar consolidation reflecting multi focal pneumonia. Continue with IV zosyn.  Blood cultures negative so far Obtain sputum cultures. Nasal cannula oxygen to keep sats greater than 90%.   Will check ambulating oxygen levels on discharge.    Acute metabolic encephalopathy in the setting of recent with baseline cognitive deficits due to MR as per the patient's family Probably secondary to a combination of healthcare associated pneumonia and cefepime Will DC cefepime and start the patient on Zosyn Pt is more alert and answering questions.  SLP eval recommending dysphagia 2 ,  nectar thick liquids.   Acute kidney injury probably secondary to dehydration Holding ARB and ACE and diuretics at this time. Baseline creatinine appears to be between 1.2-1.3 He was admitted with a creatinine of 2.38 was started on IV fluids and creatinine has improved to 1.8 to 1.69 to 1.5, then worsened to 1.98.  Would recommend to continue with  IV fluids.     -Hyperkalemia  Lokelma ordered  Potassium normalized.      Hypernatremia probably secondary to free water deficit Hydrate and recheck sodium improved from 150 to 146. To 145.  Continue with dextrose fluids   Anemia of chronic disease Hemoglobin between 10-11 suspect hemoconcentrated sample due to dehydration.  Continue to monitor    Diabetes mellitus Continue with sliding scale insulin. CBG (last 3)  Recent Labs    12/08/22 0733 12/08/22 1203 12/08/22 1655  GLUCAP 165* 101* 194*    A1c is 6.1% no change in medications    Hypocalcemia Corrected calcium level is greater than 9.    History of CLL Recommend outpatient follow-up with oncology at this time. 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     Hyperlipidemia Continue with Lipitor 10 mg  daily     Estimated body mass index is 27.06 kg/m as calculated from the following:   Height as of this encounter: 6' (1.829 m).   Weight as of this encounter: 90.5 kg.  Code Status: full code.  DVT Prophylaxis:  enoxaparin (LOVENOX) injection 40 mg Start: 12/04/22 1115   Level of Care: Level of care: Progressive Family Communication: none at bedside.   Disposition Plan:     Remains inpatient appropriate:   AKI. Pneumonia.   Procedures:  None.   Consultants:   None.   Antimicrobials:   Anti-infectives (From admission, onward)    Start     Dose/Rate Route Frequency Ordered Stop   12/07/22 1800  piperacillin-tazobactam (ZOSYN) IVPB 3.375 g        3.375 g 12.5 mL/hr over 240 Minutes Intravenous Every 8 hours 12/07/22  1647     12/05/22 2200  vancomycin (VANCOREADY) IVPB 1250 mg/250 mL  Status:  Discontinued        1,250 mg 166.7 mL/hr over 90 Minutes Intravenous Every 36 hours 12/04/22 1212 12/05/22 0903   12/04/22 2200  ceFEPIme (MAXIPIME) 2 g in sodium chloride 0.9 % 100 mL IVPB  Status:  Discontinued        2 g 200 mL/hr over 30 Minutes Intravenous Every 12 hours 12/04/22 1121 12/07/22 1644   12/04/22 0900  vancomycin (VANCOCIN) IVPB 1000 mg/200 mL premix  Status:  Discontinued        1,000 mg 200 mL/hr over 60 Minutes Intravenous  Once 12/04/22 0853 12/04/22 0857   12/04/22 0900  ceFEPIme (MAXIPIME) 2 g in sodium chloride 0.9 % 100 mL IVPB        2 g 200 mL/hr over 30 Minutes Intravenous  Once 12/04/22 0853 12/04/22 0951   12/04/22 0900  vancomycin (VANCOREADY) IVPB 2000 mg/400 mL        2,000 mg 200 mL/hr over 120 Minutes Intravenous STAT 12/04/22 0857 12/04/22 1201        Medications  Scheduled Meds:  albuterol  2.5 mg Nebulization TID   allopurinol  300 mg Oral Daily   amLODipine  10 mg Oral Daily   atorvastatin  10 mg Oral Daily   enoxaparin (LOVENOX) injection  40 mg Subcutaneous Daily   feeding supplement (GLUCERNA SHAKE)  237 mL Oral TID BM   folic acid  1 mg Oral Daily   insulin aspart  0-9 Units Subcutaneous TID WC   insulin glargine-yfgn  10 Units Subcutaneous BID   loratadine  10 mg Oral QHS   multivitamin with minerals  1 tablet Oral Daily   thiamine  100 mg Oral Daily   Or   thiamine  100 mg Intravenous Daily   Continuous Infusions:  dextrose 75 mL/hr at 12/08/22 1400   piperacillin-tazobactam 3.375 g (12/08/22 1403)   PRN Meds:.acetaminophen **OR** acetaminophen, albuterol, ondansetron **OR** ondansetron (ZOFRAN) IV, mouth rinse    Subjective:   Cody [name] was seen and examined today.  Pt more alert and answering questions.  He is weaned off oxygen. Denies any new complaints.   Objective:   Vitals:   12/08/22 0437 12/08/22 0807 12/08/22 1136  12/08/22 1334  BP: 131/72 104/66  121/70  Pulse: 78 82 90 84  Resp: '20 20 20 18  '$ Temp: 97.7 F (36.5 C) 97.8 F (36.6 C)  98.2 F (36.8 C)  TempSrc: Oral Oral  Oral  SpO2: 99% 94% 94% 95%  Weight:      Height:  Intake/Output Summary (Last 24 hours) at 12/08/2022 1808 Last data filed at 12/08/2022 1734 Gross per 24 hour  Intake 1768.74 ml  Output 1350 ml  Net 418.74 ml    Filed Weights   12/05/22 0801  Weight: 90.5 kg     Exam General exam: Appears calm and comfortable  Respiratory system: Clear to auscultation. Respiratory effort normal. Cardiovascular system: S1 & S2 heard, RRR. No JVD,  No pedal edema. Gastrointestinal system: Abdomen is nondistended, soft and nontender.  Central nervous system: Alert and oriented to person only. Grossly non focal.  Extremities: Symmetric 5 x 5 power. Skin: No rashes,  Psychiatry: Mood & affect appropriate.     Data Reviewed:  I have personally reviewed following labs and imaging studies   CBC Lab Results  Component Value Date   WBC 137.8 (HH) 12/08/2022   RBC 4.24 12/08/2022   HGB 11.1 (L) 12/08/2022   HCT 38.4 (L) 12/08/2022   MCV 90.6 12/08/2022   MCH 26.2 12/08/2022   PLT 228 12/08/2022   MCHC 28.9 (L) 12/08/2022   RDW 15.2 12/08/2022   LYMPHSABS 120.6 (H) 12/08/2022   MONOABS 3.4 (H) 12/08/2022   EOSABS 0.2 12/08/2022   BASOSABS 0.1 93/81/0175     Last metabolic panel Lab Results  Component Value Date   NA 145 12/08/2022   K 3.9 12/08/2022   CL 109 12/08/2022   CO2 27 12/08/2022   BUN 35 (H) 12/08/2022   CREATININE 1.98 (H) 12/08/2022   GLUCOSE 171 (H) 12/08/2022   GFRNONAA 36 (L) 12/08/2022   GFRAA 51 (L) 05/03/2020   CALCIUM 8.6 (L) 12/08/2022   PROT 6.6 12/07/2022   ALBUMIN 3.2 (L) 12/07/2022   BILITOT 0.7 12/07/2022   ALKPHOS 67 12/07/2022   AST 34 12/07/2022   ALT 32 12/07/2022   ANIONGAP 9 12/08/2022    CBG (last 3)  Recent Labs    12/08/22 0733 12/08/22 1203 12/08/22 1655   GLUCAP 165* 101* 194*       Coagulation Profile: Recent Labs  Lab 12/04/22 0916 12/04/22 1328  INR 1.2 1.3*      Radiology Studies: CT CHEST WO CONTRAST  Result Date: 12/08/2022 CLINICAL DATA:  Pneumonia, complication suspected, xray done worsening pneumonia EXAM: CT CHEST WITHOUT CONTRAST TECHNIQUE: Multidetector CT imaging of the chest was performed following the standard protocol without IV contrast. RADIATION DOSE REDUCTION: This exam was performed according to the departmental dose-optimization program which includes automated exposure control, adjustment of the mA and/or kV according to patient size and/or use of iterative reconstruction technique. COMPARISON:  Chest x-ray 12/04/2022.  PET CT 02/16/2020 FINDINGS: Cardiovascular: Heart is normal size.  Aorta normal caliber. Mediastinum/Nodes: Enlarged mediastinal lymph nodes, worsening since prior PET CT. Index subcarinal lymph node has a short axis diameter of 17 mm compared to 9 mm previously. AP window lymph node has a short axis diameter of 15 mm compared to 4 mm previously. Numerous other enlarged mediastinal lymph nodes in the prevascular, pretracheal and right paratracheal regions. Difficult to assess for hilar adenopathy without IV contrast. Enlarged bilateral axillary lymph nodes. Index left axillary lymph node has a short axis diameter of 13 mm compared to 11 mm previously. Index right axillary lymph node has a short axis diameter of 14 mm compared with 14 mm previously. Lungs/Pleura: Airspace disease noted in both lower lobes, right greater than left as well as the right middle lobe concerning for pneumonia. No effusions. Upper Abdomen: Mildly prominent upper abdominal lymph nodes in the gastrohepatic  ligament. Index node has a short axis diameter of 7 mm compared to 4 mm previously. Musculoskeletal: Chest wall soft tissues are unremarkable. No acute bony abnormality. IMPRESSION: Consolidation in both lower lobes, right greater  than left as well as the right middle lobe most compatible with multifocal pneumonia. Mediastinal adenopathy, worsening since prior PET CT. Stable or slightly larger bilateral axillary lymph nodes and upper abdominal lymph nodes. Findings concerning for lymphoproliferative disorder in this patient with history of CLL. Recommend oncologic consultation. Electronically Signed   By: Rolm Baptise M.D.   On: 12/08/2022 00:24       Hosie Poisson M.D. [facility] 12/08/2022, 6:08 PM  Available via Epic secure chat 7am-7pm After 7 pm, please refer to night coverage provider listed on amion.

## 2022-12-09 DIAGNOSIS — C9110 Chronic lymphocytic leukemia of B-cell type not having achieved remission: Secondary | ICD-10-CM | POA: Diagnosis not present

## 2022-12-09 DIAGNOSIS — N179 Acute kidney failure, unspecified: Secondary | ICD-10-CM | POA: Diagnosis not present

## 2022-12-09 DIAGNOSIS — E875 Hyperkalemia: Secondary | ICD-10-CM | POA: Diagnosis not present

## 2022-12-09 DIAGNOSIS — J189 Pneumonia, unspecified organism: Secondary | ICD-10-CM | POA: Diagnosis not present

## 2022-12-09 LAB — CBC WITH DIFFERENTIAL/PLATELET
Abs Immature Granulocytes: 0.85 10*3/uL — ABNORMAL HIGH (ref 0.00–0.07)
Basophils Absolute: 0.1 10*3/uL (ref 0.0–0.1)
Basophils Relative: 0 %
Eosinophils Absolute: 0.4 10*3/uL (ref 0.0–0.5)
Eosinophils Relative: 0 %
HCT: 37.4 % — ABNORMAL LOW (ref 39.0–52.0)
Hemoglobin: 10.7 g/dL — ABNORMAL LOW (ref 13.0–17.0)
Immature Granulocytes: 1 %
Lymphocytes Relative: 89 %
Lymphs Abs: 111.5 10*3/uL — ABNORMAL HIGH (ref 0.7–4.0)
MCH: 26 pg (ref 26.0–34.0)
MCHC: 28.6 g/dL — ABNORMAL LOW (ref 30.0–36.0)
MCV: 91 fL (ref 80.0–100.0)
Monocytes Absolute: 2.9 10*3/uL — ABNORMAL HIGH (ref 0.1–1.0)
Monocytes Relative: 2 %
Neutro Abs: 10.2 10*3/uL — ABNORMAL HIGH (ref 1.7–7.7)
Neutrophils Relative %: 8 %
Platelets: 198 10*3/uL (ref 150–400)
RBC: 4.11 MIL/uL — ABNORMAL LOW (ref 4.22–5.81)
RDW: 15 % (ref 11.5–15.5)
WBC: 126 10*3/uL (ref 4.0–10.5)
nRBC: 0.1 % (ref 0.0–0.2)

## 2022-12-09 LAB — GLUCOSE, CAPILLARY
Glucose-Capillary: 106 mg/dL — ABNORMAL HIGH (ref 70–99)
Glucose-Capillary: 225 mg/dL — ABNORMAL HIGH (ref 70–99)
Glucose-Capillary: 153 mg/dL — ABNORMAL HIGH (ref 70–99)
Glucose-Capillary: 182 mg/dL — ABNORMAL HIGH (ref 70–99)

## 2022-12-09 LAB — BASIC METABOLIC PANEL
Anion gap: 8 (ref 5–15)
BUN: 28 mg/dL — ABNORMAL HIGH (ref 8–23)
CO2: 26 mmol/L (ref 22–32)
Calcium: 8 mg/dL — ABNORMAL LOW (ref 8.9–10.3)
Chloride: 106 mmol/L (ref 98–111)
Creatinine, Ser: 1.89 mg/dL — ABNORMAL HIGH (ref 0.61–1.24)
GFR, Estimated: 38 mL/min — ABNORMAL LOW (ref >60)
Glucose, Bld: 198 mg/dL — ABNORMAL HIGH (ref 70–99)
Potassium: 4 mmol/L (ref 3.5–5.1)
Sodium: 140 mmol/L (ref 135–145)

## 2022-12-09 LAB — CULTURE, BLOOD (ROUTINE X 2)
Culture: NO GROWTH
Culture: NO GROWTH
Special Requests: ADEQUATE
Special Requests: ADEQUATE

## 2022-12-09 MED ORDER — ALBUTEROL SULFATE (2.5 MG/3ML) 0.083% IN NEBU
2.5000 mg | INHALATION_SOLUTION | Freq: Two times a day (BID) | RESPIRATORY_TRACT | Status: DC
  Administered 2022-12-09 – 2022-12-10 (×2): 2.5 mg via RESPIRATORY_TRACT
  Filled 2022-12-09 (×2): qty 3

## 2022-12-09 MED ORDER — ORAL CARE MOUTH RINSE
15.0000 mL | OROMUCOSAL | Status: DC
  Administered 2022-12-09 – 2022-12-11 (×10): 15 mL via OROMUCOSAL

## 2022-12-09 NOTE — Progress Notes (Signed)
SATURATION QUALIFICATIONS: (This note is used to comply with regulatory documentation for home oxygen)  Patient Saturations on Room Air at Rest = 95%  Patient Saturations on Room Air while Ambulating = 92%  Patient Saturations on 0 Liters of oxygen while Ambulating = N/A  Please briefly explain why patient needs home oxygen: Pt does not require oxygen to ambulate

## 2022-12-09 NOTE — NC FL2 (Signed)
[place] LEVEL OF CARE FORM     IDENTIFICATION  Patient Name: Cody [name] Birthdate: 12-10-1955 Sex: male Admission Date (Current Location): 12/04/2022  [facility] and [place] Number:  Herbalist and Address:  [facility],  [place], [place]      Provider Number: [id number]  Attending Physician Name and Address:  Hosie Poisson, MD  Relative Name and Phone Number:  Anola Gurney  [phone],  Lola, Lofaro Sister [phone]  [phone]    Current Level of Care: Hospital Recommended Level of Care: [place] Prior Approval Number:    Date Approved/Denied:   PASRR Number: [id number]  Discharge Plan: SNF    Current Diagnoses: Patient Active Problem List   Diagnosis Date Noted   Malnutrition of moderate degree 12/07/2022   HCAP (healthcare-associated pneumonia) 12/04/2022   AKI (acute kidney injury) ([place]) 12/04/2022   Hyperkalemia 12/04/2022   Hypocalcemia 12/04/2022   Pain due to onychomycosis of toenails of both feet 10/17/2020   Chronic lymphoid leukemia ([place]) 11/05/2018   Goals of care, counseling/discussion 11/05/2018   Hypertension 10/24/2004   Diabetes 1.5, managed as type 2 ([place]) 10/24/2004    Orientation RESPIRATION BLADDER Height & Weight     Self  Normal Incontinent Weight: 90.5 kg Height:  6' (182.9 cm)  BEHAVIORAL SYMPTOMS/MOOD NEUROLOGICAL BOWEL NUTRITION STATUS      Continent Diet (Soft, Carb mod)  AMBULATORY STATUS COMMUNICATION OF NEEDS Skin   Extensive Assist Verbally Normal                       Personal Care Assistance Level of Assistance  Bathing, Feeding, Dressing Bathing Assistance: Maximum assistance Feeding assistance: Maximum assistance Dressing Assistance: Maximum assistance     Functional Limitations Info  Sight, Hearing, Speech Sight Info: Adequate Hearing Info: Adequate Speech Info: Impaired (Slurred/Dyarthria)    SPECIAL CARE  FACTORS FREQUENCY  PT (By licensed PT), OT (By licensed OT)     PT Frequency: x5 week OT Frequency: x5 week            Contractures Contractures Info: Not present    Additional Factors Info  Code Status, Allergies Code Status Info: FULL Allergies Info: Rituxan (Rituximab)           Current Medications (12/09/2022):  This is the current hospital active medication list Current Facility-Administered Medications  Medication Dose Route Frequency Provider Last Rate Last Admin   acetaminophen (TYLENOL) tablet 650 mg  650 mg Oral Q6H PRN Reubin Milan, MD       Or   acetaminophen (TYLENOL) suppository 650 mg  650 mg Rectal Q6H PRN Reubin Milan, MD   650 mg at 12/07/22 2018   albuterol (PROVENTIL) (2.5 MG/3ML) 0.083% nebulizer solution 2.5 mg  2.5 mg Nebulization Q4H PRN Reubin Milan, MD       albuterol (PROVENTIL) (2.5 MG/3ML) 0.083% nebulizer solution 2.5 mg  2.5 mg Nebulization TID Hosie Poisson, MD   2.5 mg at 12/09/22 0810   allopurinol (ZYLOPRIM) tablet 300 mg  300 mg Oral Daily Reubin Milan, MD   300 mg at 12/09/22 8527   amLODipine (NORVASC) tablet 10 mg  10 mg Oral Daily Reubin Milan, MD   10 mg at 12/09/22 7824   atorvastatin (LIPITOR) tablet 10 mg  10 mg Oral Daily Reubin Milan, MD   10 mg at 12/09/22 0922   dextrose 5 % solution   Intravenous Continuous Hosie Poisson,  MD 75 mL/hr at 12/09/22 1000 Infusion Verify at 12/09/22 1000   enoxaparin (LOVENOX) injection 40 mg  40 mg Subcutaneous Daily Reubin Milan, MD   40 mg at 12/09/22 0925   feeding supplement (GLUCERNA SHAKE) (GLUCERNA SHAKE) liquid 237 mL  237 mL Oral TID BM Hosie Poisson, MD   237 mL at 32/67/12 4580   folic acid (FOLVITE) tablet 1 mg  1 mg Oral Daily Hosie Poisson, MD   1 mg at 12/09/22 0921   insulin aspart (novoLOG) injection 0-9 Units  0-9 Units Subcutaneous TID WC Reubin Milan, MD   2 Units at 12/08/22 1736   insulin glargine-yfgn (SEMGLEE) injection 10  Units  10 Units Subcutaneous BID Hosie Poisson, MD   10 Units at 12/09/22 0925   loratadine (CLARITIN) tablet 10 mg  10 mg Oral QHS Reubin Milan, MD   10 mg at 12/08/22 2143   multivitamin with minerals tablet 1 tablet  1 tablet Oral Daily Hosie Poisson, MD   1 tablet at 12/09/22 0921   ondansetron (ZOFRAN) tablet 4 mg  4 mg Oral Q6H PRN Reubin Milan, MD       Or   ondansetron [facility]) injection 4 mg  4 mg Intravenous Q6H PRN Reubin Milan, MD       Oral care mouth rinse  15 mL Mouth Rinse PRN Hosie Poisson, MD       Oral care mouth rinse  15 mL Mouth Rinse 4 times per day Hosie Poisson, MD   15 mL at 12/09/22 0923   piperacillin-tazobactam (ZOSYN) IVPB 3.375 g  3.375 g Intravenous Q8H [place], [place], [facility]   Stopped at 12/09/22 9983   thiamine (VITAMIN B1) tablet 100 mg  100 mg Oral Daily Hosie Poisson, MD   100 mg at 12/09/22 3825   Or   thiamine (VITAMIN B1) injection 100 mg  100 mg Intravenous Daily Hosie Poisson, MD         Discharge Medications: Please see discharge summary for a list of discharge medications.  Relevant Imaging Results:  Relevant Lab Results:   Additional Information [id number]  Purcell Mouton, RN

## 2022-12-09 NOTE — Progress Notes (Signed)
Physical Therapy Treatment Patient Details Name: Cody [name] MRN: [id number] DOB: Oct 19, 1956 Today's Date: 12/09/2022   History of Present Illness Cody [name] is a 67 y.o. male with medical history significant of alcohol abuse, alcoholic cirrhosis, cataracts, type 2 diabetes, hyperlipidemia, gout who is being brought by his sister due to fever and worsening mental status with increased confusion. Patient admitted for pneumonia.    PT Comments    Pt received supine in bed refusing OOB mobility but agreeable to bed-level exercises. Pt completed BLE and BUE exercises with cuing, several short rest-breaks. Discharge destination remains appropriate, we will continue to follow acutely.     Recommendations for follow up therapy are one component of a multi-disciplinary discharge planning process, led by the attending physician.  Recommendations may be updated based on patient status, additional functional criteria and insurance authorization.  Follow Up Recommendations  Skilled nursing-short term rehab (<3 hours/day) Can patient physically be transported by private vehicle: No   Assistance Recommended at Discharge Frequent or constant Supervision/Assistance  Patient can return home with the following A lot of help with walking and/or transfers;A lot of help with bathing/dressing/bathroom;Assistance with feeding;Assistance with cooking/housework;Help with stairs or ramp for entrance;Assist for transportation   Equipment Recommendations  Rolling walker (2 wheels)    Recommendations for Other Services       Precautions / Restrictions Precautions Precautions: Fall Restrictions Weight Bearing Restrictions: No     Mobility  Bed Mobility               General bed mobility comments: Pt refused OOB mobility    Transfers                   General transfer comment: Pt refused    Ambulation/Gait               General Gait Details: Pt  refused   Stairs             Wheelchair Mobility    Modified Rankin (Stroke Patients Only)       Balance Overall balance assessment: Needs assistance Sitting-balance support: No upper extremity supported, Feet supported Sitting balance-Leahy Scale: Fair     Standing balance support: During functional activity, Bilateral upper extremity supported Standing balance-Leahy Scale: Poor                              Cognition Arousal/Alertness: Awake/alert Behavior During Therapy: WFL for tasks assessed/performed Overall Cognitive Status: History of cognitive impairments - at baseline                                          Exercises General Exercises - Upper Extremity Shoulder Flexion: AROM, Both, 10 reps Elbow Flexion: Both, 10 reps Elbow Extension: Both, 10 reps General Exercises - Lower Extremity Ankle Circles/Pumps: Both, 10 reps, Supine Short Arc Quad: Both, 10 reps Heel Slides: Both, 10 reps Hip ABduction/ADduction: Both, 10 reps    General Comments        Pertinent Vitals/Pain Pain Assessment Pain Assessment: No/denies pain Faces Pain Scale: No hurt Pain Intervention(s): Monitored during session    Home Living                          Prior Function  PT Goals (current goals can now be found in the care plan section) Acute Rehab PT Goals Patient Stated Goal: none stated PT Goal Formulation: With patient Time For Goal Achievement: 12/21/22 Potential to Achieve Goals: Fair Progress towards PT goals: Progressing toward goals    Frequency    Min 2X/week      PT Plan Current plan remains appropriate    Co-evaluation              AM-PAC PT "6 Clicks" Mobility   Outcome Measure  Help needed turning from your back to your side while in a flat bed without using bedrails?: A Little Help needed moving from lying on your back to sitting on the side of a flat bed without using bedrails?: A  Lot Help needed moving to and from a bed to a chair (including a wheelchair)?: A Lot Help needed standing up from a chair using your arms (e.g., wheelchair or bedside chair)?: A Lot Help needed to walk in hospital room?: A Lot Help needed climbing 3-5 steps with a railing? : Total 6 Click Score: 12    End of Session   Activity Tolerance: Patient tolerated treatment well Patient left: in bed;with call bell/phone within reach;with bed alarm set Nurse Communication: Mobility status PT Visit Diagnosis: Difficulty in walking, not elsewhere classified (R26.2);Muscle weakness (generalized) (M62.81)     Time: 1141-1150 PT Time Calculation (min) (ACUTE ONLY): 9 min  Charges:  $Therapeutic Exercise: 8-22 mins                     Coolidge Breeze, PT, DPT [facility] Office: [phone] Weekend pager: [phone]   Cody [name] 12/09/2022, 12:04 PM

## 2022-12-09 NOTE — TOC Progression Note (Signed)
Transition of Care [facility]) - Progression Note    Patient Details  Name: Cody [name] MRN: [id number] Date of Birth: 1956/07/04  Transition of [facility]) CM/SW Contact  Purcell Mouton, RN Phone Number: 12/09/2022, 11:29 AM  Clinical Narrative:     Spoke with pt's sister [name] concerning discharge plan. [name] agreed with pt going to SNF [facility] for Rehab. FL2 completed, PASRR rec'd, and pt was faxed to SNF. Waiting for bed offers and insurance authorization.   Expected Discharge Plan: Home/Self Care Barriers to Discharge: Continued Medical Work up  Expected Discharge Plan and Services   Discharge Planning Services: CM Consult   Living arrangements for the past 2 months: Single Family Home                                       Social Determinants of Health (SDOH) Interventions SDOH Screenings   Food Insecurity: No Food Insecurity (12/05/2022)  Housing: Low Risk  (12/05/2022)  Transportation Needs: No Transportation Needs (12/05/2022)  Utilities: Not At Risk (12/05/2022)  Tobacco Use: Low Risk  (12/05/2022)    Readmission Risk Interventions     No data to display

## 2022-12-09 NOTE — Progress Notes (Signed)
Speech Language Pathology Treatment: Dysphagia  Patient Details Name: Cody [name] MRN: [id number] DOB: September 01, 1956 Today's Date: 12/09/2022 Time: 3903-0092 SLP Time Calculation (min) (ACUTE ONLY): 19 min  Assessment / Plan / Recommendation Clinical Impression  RN Myriam Jacobson reports pt with much improved tolerance of diet modification. Today pt is alert, remains impulsive. He willingly consumed nectar soda via straw and thin water via tsp. Swallow was clinically judged to be timely. Delayed cough x1 noted. Suspect pt may be aspirating due to ill timed swallow/airway closure and nectar liquid clinically accommodates this issue. Recommend continue dys2/nectar consider allowing thin via tsp from family when fully upright. Concerns for hydration are adequacy are not present given pt's voracious eating/drinking. Pt denies concerns for not having thin water. He refused to try any solids from this SLP - had pt place his dentures for lunch prep. At this time, donot recommend MBS - but he may benefit from one prior to discharge.   No family present at this time - RN reports pt's sister frequently eats with him.     HPI HPI: Cody [name] is a 66 yo male adm to [facility] with AMS and increased confusion. Pt has PMH + for ETOH use, alcoholic cirrhosis, gout, recent URI - unable to ambulate without assist at home.  CXR concerning for potential Right Lobe infiltrate.  Swallow eval ordered. Pt denies issues with swallowing.  Pt seen for BSE and then concern for aspiration noted and repeat swallow eval ordered.  Follow up and assessment for instrumental indication needed.      SLP Plan  Continue with current plan of care      Recommendations for follow up therapy are one component of a multi-disciplinary discharge planning process, led by the attending physician.  Recommendations may be updated based on patient status, additional functional criteria and insurance authorization.    Recommendations  Diet recommendations:  Dysphagia 2 (fine chop);Nectar-thick liquid (tsps thin) Liquids provided via: Cup;Straw Medication Administration: Whole meds with puree Supervision: Full supervision/cueing for compensatory strategies;Patient able to self feed;Staff to assist with self feeding Compensations: Slow rate;Small sips/bites;Minimize environmental distractions Postural Changes and/or Swallow Maneuvers: Seated upright 90 degrees                Oral Care Recommendations: Oral care BID Follow Up Recommendations: Follow physician's recommendations for discharge plan and follow up therapies Assistance recommended at discharge: Frequent or constant Supervision/Assistance SLP Visit Diagnosis: Dysphagia, unspecified (R13.10) Plan: Continue with current plan of care          Kathleen Lime, MS [place] Office [phone] Pager [phone]  Macario Golds  12/09/2022, 10:17 AM

## 2022-12-09 NOTE — Progress Notes (Signed)
[facility]                                                                               Cody [name], is a 67 y.o. male, DOB - [birth year], EZM:[id number] Admit date - 12/04/2022    Outpatient Primary MD for the patient is [name], MD  LOS - 5  days    Brief summary    Cody [name] is a 67 y.o. male with medical history significant of alcohol abuse, alcoholic cirrhosis, cataracts, type 2 diabetes, hyperlipidemia, gout who is being brought by his sister due to fever and worsening mental status with increased confusion.  He had to be helped by 2 persons this morning so he can get changed to come to the emergency department. The patient had a URI symptoms on December 26 and was subsequently diagnosed with pneumonia last week.   Portable 1 view chest radiograph with subtle right lung base opacity possible  Infiltrate.  Follow-up recommended. CT chest without contrast showed Consolidation in both lower lobes, right greater than left as well as the right middle lobe most compatible with multifocal pneumonia.   He was started on IV antibiotics, he is improving, currently on 2 lit of [place] oxygen. Therapy eval recommending SNF.      Assessment & Plan    Assessment and Plan:   Healthcare associated pneumonia Chest x-ray initially showed subtle right lung base opacity. Patient was admitted for IV antibiotics, he was started on IV cefepime and vancomycin. MRSA PCR is negative, DC vancomycin.  Patient received 2 days of IV cefepime but in view of his lethargy and encephalopathy changed IV cefepime to IV Zosyn. CT chest without contrast ordered showed bilateral basilar consolidation reflecting multi focal pneumonia. Continue with IV zosyn.  Blood cultures negative so far Obtain sputum cultures if able.  Nasal cannula oxygen to keep sats greater than 90%.   Please check ambulating oxygen levels prior to  discharge.    Acute metabolic encephalopathy in the  setting of baseline cognitive deficits due to MR as per the patient's family Probably secondary to a combination of healthcare associated pneumonia and cefepime Will DC cefepime and start the patient on Zosyn Pt is more alert and answering questions.  SLP eval recommending dysphagia 2 , nectar thick liquids.   Acute kidney injury probably secondary to dehydration Holding ARB and ACE and diuretics at this time. Baseline creatinine appears to be between 1.2-1.3 He was admitted with a creatinine of 2.38 was started on IV fluids and creatinine has improved to 1.8 to 1.69 to 1.5, then worsened to 1.98 to 1.89.  Patient has poor oral intake , would continue with fluids.     -Hyperkalemia  Lokelma ordered  Potassium normalized.      Hypernatremia probably secondary to free water deficit Hydrate and recheck sodium improved from 150 to 146. To 145.  Continue with dextrose fluids   Anemia of chronic disease Hemoglobin between 10-11 suspect hemoconcentrated sample due to dehydration.  Continue to monitor    Diabetes mellitus Continue with sliding scale insulin. CBG (last 3)  Recent Labs    12/08/22 2118 12/09/22 0736 12/09/22 1127  GLUCAP 174*  106* 225*    A1c is 6.1% no change in medications    Hypocalcemia Corrected calcium level is greater than 9.    History of CLL Recommend outpatient follow-up with oncology at this time. 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     Hyperlipidemia Continue with Lipitor 10 mg daily     Estimated body mass index is 27.06 kg/m as calculated from the following:   Height as of this encounter: 6' (1.829 m).   Weight as of this encounter: 90.5 kg.  Code Status: full code.  DVT Prophylaxis:  enoxaparin (LOVENOX) injection 40 mg Start: 12/04/22 1115   Level of Care: Level of care: Progressive Family Communication: none at bedside. Discussed with sister over the phone on 12/08/22  Disposition Plan:      Remains inpatient appropriate:   AKI. Pneumonia.   Procedures:  None.   Consultants:     None.  Antimicrobials:   Anti-infectives (From admission, onward)    Start     Dose/Rate Route Frequency Ordered Stop   12/07/22 1800  piperacillin-tazobactam (ZOSYN) IVPB 3.375 g        3.375 g 12.5 mL/hr over 240 Minutes Intravenous Every 8 hours 12/07/22 1647     12/05/22 2200  vancomycin (VANCOREADY) IVPB 1250 mg/250 mL  Status:  Discontinued        1,250 mg 166.7 mL/hr over 90 Minutes Intravenous Every 36 hours 12/04/22 1212 12/05/22 0903   12/04/22 2200  ceFEPIme (MAXIPIME) 2 g in sodium chloride 0.9 % 100 mL IVPB  Status:  Discontinued        2 g 200 mL/hr over 30 Minutes Intravenous Every 12 hours 12/04/22 1121 12/07/22 1644   12/04/22 0900  vancomycin (VANCOCIN) IVPB 1000 mg/200 mL premix  Status:  Discontinued        1,000 mg 200 mL/hr over 60 Minutes Intravenous  Once 12/04/22 0853 12/04/22 0857   12/04/22 0900  ceFEPIme (MAXIPIME) 2 g in sodium chloride 0.9 % 100 mL IVPB        2 g 200 mL/hr over 30 Minutes Intravenous  Once 12/04/22 0853 12/04/22 0951   12/04/22 0900  vancomycin (VANCOREADY) IVPB 2000 mg/400 mL        2,000 mg 200 mL/hr over 120 Minutes Intravenous STAT 12/04/22 0857 12/04/22 1201        Medications  Scheduled Meds:  albuterol  2.5 mg Nebulization TID   allopurinol  300 mg Oral Daily   amLODipine  10 mg Oral Daily   atorvastatin  10 mg Oral Daily   enoxaparin (LOVENOX) injection  40 mg Subcutaneous Daily   feeding supplement (GLUCERNA SHAKE)  237 mL Oral TID BM   folic acid  1 mg Oral Daily   insulin aspart  0-9 Units Subcutaneous TID WC   insulin glargine-yfgn  10 Units Subcutaneous BID   loratadine  10 mg Oral QHS   multivitamin with minerals  1 tablet Oral Daily   mouth rinse  15 mL Mouth Rinse 4 times per day   thiamine  100 mg Oral Daily   Or   thiamine  100 mg Intravenous Daily   Continuous Infusions:  dextrose 75 mL/hr at 12/09/22 1000    piperacillin-tazobactam Stopped (12/09/22 0918)   PRN Meds:.acetaminophen **OR** acetaminophen, albuterol, ondansetron **OR** ondansetron (ZOFRAN) IV, mouth rinse    Subjective:   Cody [name] was seen and examined today. He is alert and oriented . Reports feeling better.   Objective:   Vitals:  12/08/22 2000 12/09/22 0407 12/09/22 0811 12/09/22 0915  BP: 127/68 130/80  119/69  Pulse: 86 72  80  Resp: '18 18  18  '$ Temp: 97.7 F (36.5 C) 98.4 F (36.9 C)  97.9 F (36.6 C)  TempSrc: Oral Oral  Oral  SpO2: 94% 97% 94% 94%  Weight:      Height:        Intake/Output Summary (Last 24 hours) at 12/09/2022 1128 Last data filed at 12/09/2022 1000 Gross per 24 hour  Intake 2234.77 ml  Output 1000 ml  Net 1234.77 ml    Filed Weights   12/05/22 0801  Weight: 90.5 kg     Exam General exam: Appears calm and comfortable  Respiratory system: Clear to auscultation. Respiratory effort normal. On2 lit of [place] oxygen.  Cardiovascular system: S1 & S2 heard, RRR. No JVD, murmurs,  Gastrointestinal system: Abdomen is nondistended, soft and nontender.  Central nervous system: Alert and oriented. No focal neurological deficits. Mild cognitive deficits.  Extremities: Symmetric 5 x 5 power. Skin: No rashes,  Psychiatry: Mood & affect appropriate.      Data Reviewed:  I have personally reviewed following labs and imaging studies   CBC Lab Results  Component Value Date   WBC 126.0 (HH) 12/09/2022   RBC 4.11 (L) 12/09/2022   HGB 10.7 (L) 12/09/2022   HCT 37.4 (L) 12/09/2022   MCV 91.0 12/09/2022   MCH 26.0 12/09/2022   PLT 198 12/09/2022   MCHC 28.6 (L) 12/09/2022   RDW 15.0 12/09/2022   LYMPHSABS 111.5 (H) 12/09/2022   MONOABS 2.9 (H) 12/09/2022   EOSABS 0.4 12/09/2022   BASOSABS 0.1 16/08/9603     Last metabolic panel Lab Results  Component Value Date   NA 140 12/09/2022   K 4.0 12/09/2022   CL 106 12/09/2022   CO2 26 12/09/2022   BUN 28 (H) 12/09/2022    CREATININE 1.89 (H) 12/09/2022   GLUCOSE 198 (H) 12/09/2022   GFRNONAA 38 (L) 12/09/2022   GFRAA 51 (L) 05/03/2020   CALCIUM 8.0 (L) 12/09/2022   PROT 6.6 12/07/2022   ALBUMIN 3.2 (L) 12/07/2022   BILITOT 0.7 12/07/2022   ALKPHOS 67 12/07/2022   AST 34 12/07/2022   ALT 32 12/07/2022   ANIONGAP 8 12/09/2022    CBG (last 3)  Recent Labs    12/08/22 2118 12/09/22 0736 12/09/22 1127  GLUCAP 174* 106* 225*       Coagulation Profile: Recent Labs  Lab 12/04/22 0916 12/04/22 1328  INR 1.2 1.3*      Radiology Studies: CT CHEST WO CONTRAST  Result Date: 12/08/2022 CLINICAL DATA:  Pneumonia, complication suspected, xray done worsening pneumonia EXAM: CT CHEST WITHOUT CONTRAST TECHNIQUE: Multidetector CT imaging of the chest was performed following the standard protocol without IV contrast. RADIATION DOSE REDUCTION: This exam was performed according to the departmental dose-optimization program which includes automated exposure control, adjustment of the mA and/or kV according to patient size and/or use of iterative reconstruction technique. COMPARISON:  Chest x-ray 12/04/2022.  PET CT 02/16/2020 FINDINGS: Cardiovascular: Heart is normal size.  Aorta normal caliber. Mediastinum/Nodes: Enlarged mediastinal lymph nodes, worsening since prior PET CT. Index subcarinal lymph node has a short axis diameter of 17 mm compared to 9 mm previously. AP window lymph node has a short axis diameter of 15 mm compared to 4 mm previously. Numerous other enlarged mediastinal lymph nodes in the prevascular, pretracheal and right paratracheal regions. Difficult to assess for hilar adenopathy without IV contrast. Enlarged  bilateral axillary lymph nodes. Index left axillary lymph node has a short axis diameter of 13 mm compared to 11 mm previously. Index right axillary lymph node has a short axis diameter of 14 mm compared with 14 mm previously. Lungs/Pleura: Airspace disease noted in both lower lobes, right  greater than left as well as the right middle lobe concerning for pneumonia. No effusions. Upper Abdomen: Mildly prominent upper abdominal lymph nodes in the gastrohepatic ligament. Index node has a short axis diameter of 7 mm compared to 4 mm previously. Musculoskeletal: Chest wall soft tissues are unremarkable. No acute bony abnormality. IMPRESSION: Consolidation in both lower lobes, right greater than left as well as the right middle lobe most compatible with multifocal pneumonia. Mediastinal adenopathy, worsening since prior PET CT. Stable or slightly larger bilateral axillary lymph nodes and upper abdominal lymph nodes. Findings concerning for lymphoproliferative disorder in this patient with history of CLL. Recommend oncologic consultation. Electronically Signed   By: Rolm Baptise M.D.   On: 12/08/2022 00:24       Hosie Poisson M.D. [facility] 12/09/2022, 11:28 AM  Available via Epic secure chat 7am-7pm After 7 pm, please refer to night coverage provider listed on amion.

## 2022-12-09 NOTE — Progress Notes (Addendum)
Patient O2 sats dropped and sustained in the 80's during the night. RN put patient on 2L of oxygen and patient's O2 sats came up to 97%.

## 2022-12-10 ENCOUNTER — Telehealth: Payer: Self-pay | Admitting: Hematology and Oncology

## 2022-12-10 DIAGNOSIS — J189 Pneumonia, unspecified organism: Secondary | ICD-10-CM | POA: Diagnosis not present

## 2022-12-10 LAB — CBC WITH DIFFERENTIAL/PLATELET
Abs Immature Granulocytes: 0.66 10*3/uL — ABNORMAL HIGH (ref 0.00–0.07)
Basophils Absolute: 0.1 10*3/uL (ref 0.0–0.1)
Basophils Relative: 0 %
Eosinophils Absolute: 0.6 10*3/uL — ABNORMAL HIGH (ref 0.0–0.5)
Eosinophils Relative: 1 %
HCT: 35.1 % — ABNORMAL LOW (ref 39.0–52.0)
Hemoglobin: 9.9 g/dL — ABNORMAL LOW (ref 13.0–17.0)
Immature Granulocytes: 1 %
Lymphocytes Relative: 89 %
Lymphs Abs: 110.4 10*3/uL — ABNORMAL HIGH (ref 0.7–4.0)
MCH: 25.6 pg — ABNORMAL LOW (ref 26.0–34.0)
MCHC: 28.2 g/dL — ABNORMAL LOW (ref 30.0–36.0)
MCV: 90.7 fL (ref 80.0–100.0)
Monocytes Absolute: 2.8 10*3/uL — ABNORMAL HIGH (ref 0.1–1.0)
Monocytes Relative: 2 %
Neutro Abs: 8.6 10*3/uL — ABNORMAL HIGH (ref 1.7–7.7)
Neutrophils Relative %: 7 %
Platelets: 206 10*3/uL (ref 150–400)
RBC: 3.87 MIL/uL — ABNORMAL LOW (ref 4.22–5.81)
RDW: 15.1 % (ref 11.5–15.5)
WBC Morphology: ABNORMAL
WBC: 123.2 10*3/uL (ref 4.0–10.5)
nRBC: 0 % (ref 0.0–0.2)

## 2022-12-10 LAB — GLUCOSE, CAPILLARY
Glucose-Capillary: 104 mg/dL — ABNORMAL HIGH (ref 70–99)
Glucose-Capillary: 135 mg/dL — ABNORMAL HIGH (ref 70–99)
Glucose-Capillary: 149 mg/dL — ABNORMAL HIGH (ref 70–99)
Glucose-Capillary: 153 mg/dL — ABNORMAL HIGH (ref 70–99)

## 2022-12-10 MED ORDER — GUAIFENESIN-DM 100-10 MG/5ML PO SYRP
5.0000 mL | ORAL_SOLUTION | ORAL | Status: DC | PRN
  Administered 2022-12-10 – 2022-12-11 (×2): 5 mL via ORAL
  Filled 2022-12-10 (×2): qty 10

## 2022-12-10 MED ORDER — PHENOL 1.4 % MT LIQD: 1.0000 | OROMUCOSAL | Status: DC | PRN

## 2022-12-10 MED ORDER — METFORMIN HCL ER 500 MG PO TB24
500.0000 mg | ORAL_TABLET | Freq: Every day | ORAL | Status: DC
  Administered 2022-12-10 – 2022-12-11 (×2): 500 mg via ORAL
  Filled 2022-12-10 (×3): qty 1

## 2022-12-10 MED ORDER — AMOXICILLIN-POT CLAVULANATE 875-125 MG PO TABS
1.0000 | ORAL_TABLET | Freq: Two times a day (BID) | ORAL | Status: DC
  Administered 2022-12-10 – 2022-12-12 (×5): 1 via ORAL
  Filled 2022-12-10 (×5): qty 1

## 2022-12-10 MED ORDER — LINAGLIPTIN 5 MG PO TABS
5.0000 mg | ORAL_TABLET | Freq: Every morning | ORAL | Status: DC
  Administered 2022-12-10 – 2022-12-12 (×3): 5 mg via ORAL
  Filled 2022-12-10 (×3): qty 1

## 2022-12-10 MED ORDER — METFORMIN HCL ER 500 MG PO TB24
1000.0000 mg | ORAL_TABLET | Freq: Every day | ORAL | Status: DC
  Administered 2022-12-10: 1000 mg via ORAL
  Filled 2022-12-10: qty 2

## 2022-12-10 NOTE — Progress Notes (Signed)
PROGRESS NOTE   Cody [name]  JZP:[id number] DOB: 1956-05-17 DOA: 12/04/2022 PCP: [name], MD  Brief Narrative:   67 year old black male known history of CLL previously on Rituxan (intolerant)- considering now acalabrutinib versus ibrutinib Sees Dr. Lorenso Courier Prior EtOH, DM TY 2, HLD, gout At baseline requires minimal assistance-came to emergency room on 1/11 with underlying confusion and weakness unable to ambulate Had URI symptoms 12/26 and developed pneumonia Found to have healthcare associated pneumonia and admitted to inpatient and started on cefepime and vancomycin  Patient became confused and cefepime was broadened to Zosyn  Hospital-Problem based course  HCAP - Narrowed to Augmentin twice daily 1/17 and continue to complete 10 days total on 1/20 -Saline lock -WBC still elevated but likely leukemoid type reaction to infection  AKI on admission, baseline creatinine 1.3 - PTA was on HCTZ 25/telmisartan 80 which has been held - Seems to be getting closer to baseline, will repeat labs in the morning  Hyperkalemia on admission - Patient was given [facility] and this has been discontinued  Hypernatremia with free water deficit on admit - Trending down on D5 will discontinue today as is eating and drinking - Repeat labs a.m.  DM TY 2 A1c 6.1 - CBGs ranging from 100-1 80 - Sliding scale to continue, metformin held from admission and will resume - Takes Toujeo as a sliding scale so we will resume the same - Will resume linagliptin 5 as well  Underlying CLL - He is intolerant to Rituxan and will need outpatient discussion  DVT prophylaxis: Lovenox Code Status: Presumed full Family Communication: None present Disposition:  Status is: Inpatient Remains inpatient appropriate because:   Requires resolution of pneumonia and electrolyte abnormalities likely discharge to skilled facility at 2 to 3 days     Subjective: Awake coherent seems a little irritable does not  wish to talk No overt fever chills nausea vomiting, not on oxygen  Objective: Vitals:   12/10/22 0327 12/10/22 0821 12/10/22 0824 12/10/22 0928  BP: 122/77   132/78  Pulse: 80   81  Resp: 16     Temp: 98.2 F (36.8 C)     TempSrc: Oral     SpO2: 97% 90% 90% 100%  Weight:      Height:        Intake/Output Summary (Last 24 hours) at 12/10/2022 1009 Last data filed at 12/10/2022 0900 Gross per 24 hour  Intake 1962.3 ml  Output 2225 ml  Net -262.7 ml   Filed Weights   12/05/22 0801  Weight: 90.5 kg    Examination:  EOMI NCAT no focal deficit no icterus no pallor no rales no rhonchi no wheeze CTAB no added sound no rales rhonchi ROM intact no focal deficit Abdomen obese nontender nondistended no rebound No lower extremity edema   Data Reviewed: personally reviewed   CBC    Component Value Date/Time   WBC 123.2 (HH) 12/10/2022 0531   RBC 3.87 (L) 12/10/2022 0531   HGB 9.9 (L) 12/10/2022 0531   HGB 11.9 (L) 09/02/2022 0907   HCT 35.1 (L) 12/10/2022 0531   PLT 206 12/10/2022 0531   PLT 130 (L) 09/02/2022 0907   MCV 90.7 12/10/2022 0531   MCH 25.6 (L) 12/10/2022 0531   MCHC 28.2 (L) 12/10/2022 0531   RDW 15.1 12/10/2022 0531   LYMPHSABS 110.4 (H) 12/10/2022 0531   MONOABS 2.8 (H) 12/10/2022 0531   EOSABS 0.6 (H) 12/10/2022 0531   BASOSABS 0.1 12/10/2022 0531  Latest Ref Rng & Units 12/09/2022    9:49 AM 12/08/2022    6:27 AM 12/07/2022    5:53 AM  CMP  Glucose 70 - 99 mg/dL 198  171  116   BUN 8 - 23 mg/dL 28  35  33   Creatinine 0.61 - 1.24 mg/dL 1.89  1.98  1.54   Sodium 135 - 145 mmol/L 140  145  147   Potassium 3.5 - 5.1 mmol/L 4.0  3.9  4.0   Chloride 98 - 111 mmol/L 106  109  113   CO2 22 - 32 mmol/L '26  27  25   '$ Calcium 8.9 - 10.3 mg/dL 8.0  8.6  8.9   Total Protein 6.5 - 8.1 g/dL   6.6   Total Bilirubin 0.3 - 1.2 mg/dL   0.7   Alkaline Phos 38 - 126 U/L   67   AST 15 - 41 U/L   34   ALT 0 - 44 U/L   32      Radiology Studies: No results  found.   Scheduled Meds:  allopurinol  300 mg Oral Daily   amLODipine  10 mg Oral Daily   amoxicillin-clavulanate  1 tablet Oral Q12H   atorvastatin  10 mg Oral Daily   enoxaparin (LOVENOX) injection  40 mg Subcutaneous Daily   feeding supplement (GLUCERNA SHAKE)  237 mL Oral TID BM   folic acid  1 mg Oral Daily   insulin aspart  0-9 Units Subcutaneous TID WC   insulin glargine-yfgn  10 Units Subcutaneous BID   linagliptin  5 mg Oral q AM   loratadine  10 mg Oral QHS   metFORMIN  1,000 mg Oral QHS   metFORMIN  500 mg Oral Q breakfast   multivitamin with minerals  1 tablet Oral Daily   mouth rinse  15 mL Mouth Rinse 4 times per day   thiamine  100 mg Oral Daily   Or   thiamine  100 mg Intravenous Daily   Continuous Infusions:     LOS: 6 days   Time spent: [place], MD [facility] To contact the attending provider between 7A-7P or the covering provider during after hours 7P-7A, please log into the web site [email or web address] and access using universal [place] password for that web site. If you do not have the password, please call the hospital operator.  12/10/2022, 10:09 AM

## 2022-12-10 NOTE — TOC Progression Note (Signed)
Transition of Care [facility]) - Progression Note    Patient Details  Name: Cody [name] MRN: [id number] Date of Birth: 10/20/1956  [facility] CM/SW Contact  Purcell Mouton, RN Phone Number: 12/10/2022, 9:27 AM  Clinical Narrative:     Bed offers given to pt's sister [name]. Waiting for selection. Pt will need insurance authorization.   Expected Discharge Plan: Home/Self Care Barriers to Discharge: Continued Medical Work up  Expected Discharge Plan and Services   Discharge Planning Services: CM Consult   Living arrangements for the past 2 months: Single Family Home                                       Social Determinants of Health (SDOH) Interventions SDOH Screenings   Food Insecurity: No Food Insecurity (12/05/2022)  Housing: Low Risk  (12/05/2022)  Transportation Needs: No Transportation Needs (12/05/2022)  Utilities: Not At Risk (12/05/2022)  Tobacco Use: Low Risk  (12/05/2022)    Readmission Risk Interventions     No data to display

## 2022-12-10 NOTE — Progress Notes (Signed)
Occupational Therapy Treatment Patient Details Name: Cody [name] MRN: [id number] DOB: 16-Jan-1956 Today's Date: 12/10/2022   History of present illness Cody [name] is a 67 y.o. male with medical history significant of alcohol abuse, alcoholic cirrhosis, cataracts, type 2 diabetes, hyperlipidemia, gout who is being brought by his sister due to fever and worsening mental status with increased confusion. Patient admitted for pneumonia.   OT comments  Patient now able to feed himself. He was able to ambulate to bathroom with min guard and stand for toileting and to wash his hands at the sink. He ambulated in the hall with a walker with no overt loss of balance. Patient making good progress. He was more conversant today and used more words. Cont POC.    Recommendations for follow up therapy are one component of a multi-disciplinary discharge planning process, led by the attending physician.  Recommendations may be updated based on patient status, additional functional criteria and insurance authorization.    Follow Up Recommendations  Skilled nursing-short term rehab (<3 hours/day)     Assistance Recommended at Discharge Frequent or constant Supervision/Assistance  Patient can return home with the following  Assistance with cooking/housework;A little help with walking and/or transfers;Direct supervision/assist for financial management;Assist for transportation;Help with stairs or ramp for entrance;Direct supervision/assist for medications management;A little help with bathing/dressing/bathroom   Equipment Recommendations  None recommended by OT    Recommendations for Other Services      Precautions / Restrictions Precautions Precautions: Fall Precaution Comments: denies h/o falls in past 6 months Restrictions Weight Bearing Restrictions: No       Mobility Bed Mobility               General bed mobility comments: up in recliner    Transfers Overall transfer  level: Needs assistance Equipment used: Rolling walker (2 wheels) Transfers: Sit to/from Stand Sit to Stand: Min guard           General transfer comment: Able to ambulate in room without device - min guard for safety. Patient intermittently using hand holds on wall. patient ambulated in hall with walker     Balance Overall balance assessment: Mild deficits observed, not formally tested                                         ADL either performed or assessed with clinical judgement   ADL Overall ADL's : Needs assistance/impaired Eating/Feeding: Set up;Sitting Eating/Feeding Details (indicate cue type and reason): per nursing able to feed himself today Grooming: Standing;Wash/dry hands;Supervision/safety Grooming Details (indicate cue type and reason): washed hands at the sink today                 Toilet Transfer: Min Psychiatric nurse Details (indicate cue type and reason): stood over toilet Toileting- Clothing Manipulation and Hygiene: Supervision/safety         General ADL Comments: Min guard to supervision to ambulate to bathroom, stand for toileting and to wash hands at sink.    Extremity/Trunk Assessment Upper Extremity Assessment Upper Extremity Assessment: Overall WFL for tasks assessed   Lower Extremity Assessment Lower Extremity Assessment: Defer to PT evaluation   Cervical / Trunk Assessment Cervical / Trunk Assessment: Normal    Vision Baseline Vision/History: 0 No visual deficits     Perception     Praxis      Cognition Arousal/Alertness: Awake/alert Behavior During Therapy:  WFL for tasks assessed/performed Overall Cognitive Status: No family/caregiver present to determine baseline cognitive functioning                                          Exercises      Shoulder Instructions       General Comments      Pertinent Vitals/ Pain       Pain Assessment Pain Assessment: No/denies pain  Home  Living                                          Prior Functioning/Environment              Frequency  Min 2X/week        Progress Toward Goals  OT Goals(current goals can now be found in the care plan section)  Progress towards OT goals: Progressing toward goals  Acute Rehab OT Goals OT Goal Formulation: With patient Time For Goal Achievement: 12/20/22 Potential to Achieve Goals: Good  Plan Discharge plan remains appropriate    Co-evaluation                 AM-PAC OT "6 Clicks" Daily Activity     Outcome Measure   Help from another person eating meals?: A Little Help from another person taking care of personal grooming?: A Little Help from another person toileting, which includes using toliet, bedpan, or urinal?: A Little Help from another person bathing (including washing, rinsing, drying)?: A Little Help from another person to put on and taking off regular upper body clothing?: A Little Help from another person to put on and taking off regular lower body clothing?: A Little 6 Click Score: 18    End of Session Equipment Utilized During Treatment: Rolling walker (2 wheels)  OT Visit Diagnosis: Unsteadiness on feet (R26.81);Muscle weakness (generalized) (M62.81)   Activity Tolerance Patient tolerated treatment well   Patient Left in chair;with call bell/phone within reach;with chair alarm set   Nurse Communication Mobility status        Time: 1610-9604 OT Time Calculation (min): 10 min  Charges: OT General Charges $OT Visit: 1 Visit OT Treatments $Self Care/Home Management : 8-22 mins  Gustavo Lah, OTR/L [place]  Office [phone]   Lenward Chancellor 12/10/2022, 3:18 PM

## 2022-12-10 NOTE — Progress Notes (Signed)
Physical Therapy Treatment Patient Details Name: Cody [name] MRN: [id number] DOB: 04-27-1956 Today's Date: 12/10/2022   History of Present Illness Cody [name] is a 67 y.o. male with medical history significant of alcohol abuse, alcoholic cirrhosis, cataracts, type 2 diabetes, hyperlipidemia, gout who is being brought by his sister due to fever and worsening mental status with increased confusion. Patient admitted for pneumonia.    PT Comments    Pt tolerated increased activity level today, he ambulated 67' with a RW, no loss of balance. Encouragement required for participation.  Pt stated he lives with his mother. He'd benefit from ST-SNF to facilitate return to independence with mobility.    Recommendations for follow up therapy are one component of a multi-disciplinary discharge planning process, led by the attending physician.  Recommendations may be updated based on patient status, additional functional criteria and insurance authorization.  Follow Up Recommendations  SNF     Assistance Recommended at Discharge Intermittent Supervision/Assistance  Patient can return home with the following A little help with walking and/or transfers;A little help with bathing/dressing/bathroom;Assistance with cooking/housework;Assist for transportation;Help with stairs or ramp for entrance   Equipment Recommendations  Rolling walker (2 wheels)    Recommendations for Other Services       Precautions / Restrictions Precautions Precautions: Fall Precaution Comments: denies h/o falls in past 6 months Restrictions Weight Bearing Restrictions: No     Mobility  Bed Mobility               General bed mobility comments: sitting EOB    Transfers Overall transfer level: Needs assistance Equipment used: Rolling walker (2 wheels) Transfers: Sit to/from Stand Sit to Stand: Min assist           General transfer comment: VCs hand placement     Ambulation/Gait Ambulation/Gait assistance: Min guard Gait Distance (Feet): 80 Feet Assistive device: Rolling walker (2 wheels) Gait Pattern/deviations: Decreased stride length Gait velocity: WFL     General Gait Details: pt required encouragement to participate, initially refused ambulation but agreed to a short walk with encouragement, steady, no loss of balance   Stairs             Wheelchair Mobility    Modified Rankin (Stroke Patients Only)       Balance Overall balance assessment: Needs assistance Sitting-balance support: No upper extremity supported, Feet supported Sitting balance-Leahy Scale: Good     Standing balance support: During functional activity, Bilateral upper extremity supported Standing balance-Leahy Scale: Fair                              Cognition Arousal/Alertness: Awake/alert Behavior During Therapy: WFL for tasks assessed/performed Overall Cognitive Status: No family/caregiver present to determine baseline cognitive functioning                                 General Comments: no formal history of cognitive impairment in chart however pt presents with childlike behavior and responds with short word answers        Exercises      General Comments        Pertinent Vitals/Pain Pain Assessment Pain Assessment: No/denies pain Pain Score: 0-No pain Faces Pain Scale: No hurt    Home Living  Prior Function            PT Goals (current goals can now be found in the care plan section) Acute Rehab PT Goals Patient Stated Goal: none stated PT Goal Formulation: With patient Time For Goal Achievement: 12/21/22 Potential to Achieve Goals: Fair Progress towards PT goals: Progressing toward goals    Frequency    Min 3X/week      PT Plan Current plan remains appropriate    Co-evaluation              AM-PAC PT "6 Clicks" Mobility   Outcome Measure  Help  needed turning from your back to your side while in a flat bed without using bedrails?: A Little Help needed moving from lying on your back to sitting on the side of a flat bed without using bedrails?: A Little Help needed moving to and from a bed to a chair (including a wheelchair)?: A Little Help needed standing up from a chair using your arms (e.g., wheelchair or bedside chair)?: A Little Help needed to walk in hospital room?: A Little Help needed climbing 3-5 steps with a railing? : A Lot 6 Click Score: 17    End of Session Equipment Utilized During Treatment: Gait belt Activity Tolerance: Patient tolerated treatment well Patient left: in chair;with call bell/phone within reach;with chair alarm set Nurse Communication: Mobility status PT Visit Diagnosis: Difficulty in walking, not elsewhere classified (R26.2);Muscle weakness (generalized) (M62.81)     Time: 1898-4210 PT Time Calculation (min) (ACUTE ONLY): 8 min  Charges:  $Gait Training: 8-22 mins                    Blondell Reveal Kistler PT 12/10/2022  Acute Rehabilitation Services  Office [phone]

## 2022-12-10 NOTE — Progress Notes (Signed)
RN received report on patient and will resume patient's care until end of the shift (12/10/22 at 0700). RN agrees with patient's most current assessment. Will continue to monitor patient for any changes.

## 2022-12-10 NOTE — Telephone Encounter (Signed)
Called to confirm r/s appointments. Spoke with patient's sister. Patient will be notified.

## 2022-12-11 DIAGNOSIS — J189 Pneumonia, unspecified organism: Secondary | ICD-10-CM | POA: Diagnosis not present

## 2022-12-11 LAB — COMPREHENSIVE METABOLIC PANEL
ALT: 48 U/L — ABNORMAL HIGH (ref 0–44)
AST: 40 U/L (ref 15–41)
Albumin: 3.2 g/dL — ABNORMAL LOW (ref 3.5–5.0)
Alkaline Phosphatase: 71 U/L (ref 38–126)
Anion gap: 9 (ref 5–15)
BUN: 26 mg/dL — ABNORMAL HIGH (ref 8–23)
CO2: 22 mmol/L (ref 22–32)
Calcium: 8.2 mg/dL — ABNORMAL LOW (ref 8.9–10.3)
Chloride: 108 mmol/L (ref 98–111)
Creatinine, Ser: 2.05 mg/dL — ABNORMAL HIGH (ref 0.61–1.24)
GFR, Estimated: 35 mL/min — ABNORMAL LOW (ref >60)
Glucose, Bld: 108 mg/dL — ABNORMAL HIGH (ref 70–99)
Potassium: 5.3 mmol/L — ABNORMAL HIGH (ref 3.5–5.1)
Sodium: 139 mmol/L (ref 135–145)
Total Bilirubin: 0.6 mg/dL (ref 0.3–1.2)
Total Protein: 7.1 g/dL (ref 6.5–8.1)

## 2022-12-11 LAB — CBC
HCT: 38.3 % — ABNORMAL LOW (ref 39.0–52.0)
Hemoglobin: 11 g/dL — ABNORMAL LOW (ref 13.0–17.0)
MCH: 25.8 pg — ABNORMAL LOW (ref 26.0–34.0)
MCHC: 28.7 g/dL — ABNORMAL LOW (ref 30.0–36.0)
MCV: 89.9 fL (ref 80.0–100.0)
Platelets: 212 10*3/uL (ref 150–400)
RBC: 4.26 MIL/uL (ref 4.22–5.81)
RDW: 15.3 % (ref 11.5–15.5)
WBC: 132.8 10*3/uL (ref 4.0–10.5)
nRBC: 0.1 % (ref 0.0–0.2)

## 2022-12-11 LAB — GLUCOSE, CAPILLARY
Glucose-Capillary: 86 mg/dL (ref 70–99)
Glucose-Capillary: 95 mg/dL (ref 70–99)
Glucose-Capillary: 129 mg/dL — ABNORMAL HIGH (ref 70–99)
Glucose-Capillary: 195 mg/dL — ABNORMAL HIGH (ref 70–99)

## 2022-12-11 MED ORDER — METFORMIN HCL ER 500 MG PO TB24
500.0000 mg | ORAL_TABLET | Freq: Two times a day (BID) | ORAL | Status: DC
  Administered 2022-12-11 – 2022-12-12 (×2): 500 mg via ORAL
  Filled 2022-12-11 (×2): qty 1

## 2022-12-11 NOTE — TOC Progression Note (Signed)
Transition of Care [facility]) - Progression Note    Patient Details  Name: Cody [name] MRN: [id number] Date of Birth: October 12, 1956  Transition of Care [facility]) CM/SW Contact  Leeroy Cha, RN Phone Number: 12/11/2022, 10:16 AM  Clinical Narrative:    Will caLL Family with choices: Service Provider Request Status Selected Services Address Phone Fax Patient Preferred  [facility] Preferred SNF  Accepted N/A [address], [place] [address] [phone] --  [place] Preferred SNF  Accepted N/A [address], [place] [address] [phone] --     Expected Discharge Plan: Home/Self Care Barriers to Discharge: Continued Medical Work up  Expected Discharge Plan and Services   Discharge Planning Services: CM Consult   Living arrangements for the past 2 months: Single Family Home                                       Social Determinants of Health (SDOH) Interventions SDOH Screenings   Food Insecurity: No Food Insecurity (12/05/2022)  Housing: Low Risk  (12/05/2022)  Transportation Needs: No Transportation Needs (12/05/2022)  Utilities: Not At Risk (12/05/2022)  Tobacco Use: Low Risk  (12/05/2022)    Readmission Risk Interventions   No data to display

## 2022-12-11 NOTE — Care Management Important Message (Signed)
Important Message  Patient Details IM Letter given. Name: Cody [name] MRN: [id number] Date of Birth: 1956/06/22   Medicare Important Message Given:  Yes     [name] 12/11/2022, 1:18 PM

## 2022-12-11 NOTE — Progress Notes (Signed)
Mobility Specialist - Progress Note   12/11/22 1208  Mobility  Activity Ambulated with assistance in hallway  Level of Assistance Standby assist, set-up cues, supervision of patient - no hands on  Assistive Device Front wheel walker  Distance Ambulated (ft) 80 ft  Activity Response Tolerated well  Mobility Referral Yes  $Mobility charge 1 Mobility   Pt received in recliner and agreeable to mobility. No complaints during session. Pt to recliner after session with all needs met.   [facility]

## 2022-12-11 NOTE — Progress Notes (Signed)
Mobility Specialist - Progress Note   12/11/22 1512  Mobility  Activity Ambulated with assistance in hallway  Level of Assistance Standby assist, set-up cues, supervision of patient - no hands on  Assistive Device Front wheel walker  Distance Ambulated (ft) 260 ft  Activity Response Tolerated well  Mobility Referral Yes  $Mobility charge 1 Mobility   Pt received in recliner and agreeable to mobility. No complaints during session. Pt to recliner after session with all needs met.    [facility]

## 2022-12-11 NOTE — Progress Notes (Signed)
PROGRESS NOTE   Cody [name]  IRS:[id number] DOB: 28-Sep-1956 DOA: 12/04/2022 PCP: [name], MD  Brief Narrative:   67 year old black male known history of CLL previously on Rituxan (intolerant)- considering now acalabrutinib versus ibrutinib Sees Dr. Lorenso Courier Prior EtOH, DM TY 2, HLD, gout At baseline requires minimal assistance-came to emergency room on 1/11 with underlying confusion and weakness unable to ambulate Had URI symptoms 12/26 and developed pneumonia Found to have healthcare associated pneumonia and admitted to inpatient and started on cefepime and vancomycin  Patient became confused and cefepime was broadened to Zosyn  Hospital-Problem based course  HCAP - Narrowed to Augmentin twice daily 1/17 and continue to complete 10 days total on 1/20 -Saline lock -WBC still elevated but likely leukemoid type reaction to infection  AKI on admission, baseline creatinine 1.3 - PTA was on HCTZ 25/telmisartan 80 which has been held - Seems to be getting closer to baseline, force fluids as above  Hyperkalemia on admission - Patient was given Lokelma earlier in hospital stay -He is still slightly hyperkalemic so we will repeat labs in a.m.-he may need ongoing dosing of Lokelma if this occurs  Hypernatremia with free water deficit on admit - Trending down on D5 will discontinue today as is eating and drinking - He still has some mild azotemia and I have encouraged him to drink more in the presence of nursing  DM TY 2 A1c 6.1 - CBGs ranging from 100-150 - Sliding scale to continue, - Linagliptin 5, metformin 1000 at bedtime, metformin 500 a.m. as well as long-acting insulin 10 units resumed t  Underlying CLL - He is intolerant to Rituxan and will need outpatient discussion  DVT prophylaxis: Lovenox Code Status: Presumed full Family Communication:  Disposition:  Status is: Inpatient Remains inpatient appropriate because:   Still requires increase in diet and  electrolyte monitoring     Subjective:  Sitting in chair does not seem uncomfortable-has been up several times with 1 assist No fever no chills-remains impulsive  Objective: Vitals:   12/10/22 2139 12/11/22 0110 12/11/22 0457 12/11/22 0511  BP: (!) 140/85 100/60 (!) 89/48 115/68  Pulse: 87 78 75 73  Resp: '20 18 16   '$ Temp: 97.8 F (36.6 C) 97.7 F (36.5 C) (!) 97.4 F (36.3 C)   TempSrc: Oral Oral Oral   SpO2: 96% 95% 94%   Weight:      Height:        Intake/Output Summary (Last 24 hours) at 12/11/2022 0933 Last data filed at 12/11/2022 0840 Gross per 24 hour  Intake 930 ml  Output 1250 ml  Net -320 ml    Filed Weights   12/05/22 0801  Weight: 90.5 kg    Examination:  Awake coherent no distress looks younger than stated age Chest is clear no added sound ROM is intact S1-S2 no murmur Abdomen soft no rebound No lower extremity edema   Data Reviewed: personally reviewed   CBC    Component Value Date/Time   WBC 132.8 (HH) 12/11/2022 0436   RBC 4.26 12/11/2022 0436   HGB 11.0 (L) 12/11/2022 0436   HGB 11.9 (L) 09/02/2022 0907   HCT 38.3 (L) 12/11/2022 0436   PLT 212 12/11/2022 0436   PLT 130 (L) 09/02/2022 0907   MCV 89.9 12/11/2022 0436   MCH 25.8 (L) 12/11/2022 0436   MCHC 28.7 (L) 12/11/2022 0436   RDW 15.3 12/11/2022 0436   LYMPHSABS 110.4 (H) 12/10/2022 0531   MONOABS 2.8 (H) 12/10/2022 0531  EOSABS 0.6 (H) 12/10/2022 0531   BASOSABS 0.1 12/10/2022 0531      Latest Ref Rng & Units 12/11/2022    4:36 AM 12/09/2022    9:49 AM 12/08/2022    6:27 AM  CMP  Glucose 70 - 99 mg/dL 108  198  171   BUN 8 - 23 mg/dL 26  28  35   Creatinine 0.61 - 1.24 mg/dL 2.05  1.89  1.98   Sodium 135 - 145 mmol/L 139  140  145   Potassium 3.5 - 5.1 mmol/L 5.3  4.0  3.9   Chloride 98 - 111 mmol/L 108  106  109   CO2 22 - 32 mmol/L '22  26  27   '$ Calcium 8.9 - 10.3 mg/dL 8.2  8.0  8.6   Total Protein 6.5 - 8.1 g/dL 7.1     Total Bilirubin 0.3 - 1.2 mg/dL 0.6      Alkaline Phos 38 - 126 U/L 71     AST 15 - 41 U/L 40     ALT 0 - 44 U/L 48        Radiology Studies: No results found.   Scheduled Meds:  allopurinol  300 mg Oral Daily   amLODipine  10 mg Oral Daily   amoxicillin-clavulanate  1 tablet Oral Q12H   atorvastatin  10 mg Oral Daily   enoxaparin (LOVENOX) injection  40 mg Subcutaneous Daily   feeding supplement (GLUCERNA SHAKE)  237 mL Oral TID BM   folic acid  1 mg Oral Daily   insulin aspart  0-9 Units Subcutaneous TID WC   insulin glargine-yfgn  10 Units Subcutaneous BID   linagliptin  5 mg Oral q AM   loratadine  10 mg Oral QHS   metFORMIN  1,000 mg Oral QHS   metFORMIN  500 mg Oral Q breakfast   multivitamin with minerals  1 tablet Oral Daily   mouth rinse  15 mL Mouth Rinse 4 times per day   thiamine  100 mg Oral Daily   Or   thiamine  100 mg Intravenous Daily   Continuous Infusions:     LOS: 7 days   Time spent: [place], MD [facility] To contact the attending provider between 7A-7P or the covering provider during after hours 7P-7A, please log into the web site [email or web address] and access using universal [place] password for that web site. If you do not have the password, please call the hospital operator.  12/11/2022, 9:33 AM

## 2022-12-11 NOTE — TOC Progression Note (Addendum)
Transition of Care [facility]) - Progression Note    Patient Details  Name: ROBY DONAWAY MRN: [id number] Date of Birth: 1956/05/30  Transition [facility]) CM/SW Contact  Leeroy Cha, RN Phone Number: 12/11/2022, 3:02 PM  Clinical Narrative:    Tct-sister- dioes want [facility] for snf. Approval auth number [id number] Tct-[facility]-message to call back. [facility]-K[facility] Megan Salon will have bed in the am.  Md notified. Expected Discharge Plan: Home/Self Care Barriers to Discharge: Continued Medical Work up  Expected Discharge Plan and Services   Discharge Planning Services: CM Consult   Living arrangements for the past 2 months: Single Family Home                                       Social Determinants of Health (SDOH) Interventions SDOH Screenings   Food Insecurity: No Food Insecurity (12/05/2022)  Housing: Low Risk  (12/05/2022)  Transportation Needs: No Transportation Needs (12/05/2022)  Utilities: Not At Risk (12/05/2022)  Tobacco Use: Low Risk  (12/05/2022)    Readmission Risk Interventions   No data to display

## 2022-12-12 ENCOUNTER — Inpatient Hospital Stay: Payer: Medicare PPO

## 2022-12-12 ENCOUNTER — Inpatient Hospital Stay: Payer: Medicare PPO | Admitting: Hematology and Oncology

## 2022-12-12 DIAGNOSIS — C9110 Chronic lymphocytic leukemia of B-cell type not having achieved remission: Secondary | ICD-10-CM | POA: Diagnosis present

## 2022-12-12 DIAGNOSIS — E875 Hyperkalemia: Secondary | ICD-10-CM | POA: Diagnosis not present

## 2022-12-12 DIAGNOSIS — R1312 Dysphagia, oropharyngeal phase: Secondary | ICD-10-CM | POA: Diagnosis not present

## 2022-12-12 DIAGNOSIS — E1369 Other specified diabetes mellitus with other specified complication: Secondary | ICD-10-CM | POA: Diagnosis not present

## 2022-12-12 DIAGNOSIS — K7030 Alcoholic cirrhosis of liver without ascites: Secondary | ICD-10-CM | POA: Diagnosis not present

## 2022-12-12 DIAGNOSIS — E119 Type 2 diabetes mellitus without complications: Secondary | ICD-10-CM | POA: Diagnosis not present

## 2022-12-12 DIAGNOSIS — M6281 Muscle weakness (generalized): Secondary | ICD-10-CM | POA: Diagnosis not present

## 2022-12-12 DIAGNOSIS — R590 Localized enlarged lymph nodes: Secondary | ICD-10-CM | POA: Diagnosis not present

## 2022-12-12 DIAGNOSIS — I10 Essential (primary) hypertension: Secondary | ICD-10-CM | POA: Diagnosis not present

## 2022-12-12 DIAGNOSIS — E785 Hyperlipidemia, unspecified: Secondary | ICD-10-CM | POA: Diagnosis not present

## 2022-12-12 DIAGNOSIS — Z7401 Bed confinement status: Secondary | ICD-10-CM | POA: Diagnosis not present

## 2022-12-12 DIAGNOSIS — E440 Moderate protein-calorie malnutrition: Secondary | ICD-10-CM | POA: Diagnosis not present

## 2022-12-12 DIAGNOSIS — R531 Weakness: Secondary | ICD-10-CM | POA: Diagnosis not present

## 2022-12-12 DIAGNOSIS — J189 Pneumonia, unspecified organism: Secondary | ICD-10-CM | POA: Diagnosis not present

## 2022-12-12 LAB — COMPREHENSIVE METABOLIC PANEL
ALT: 45 U/L — ABNORMAL HIGH (ref 0–44)
AST: 33 U/L (ref 15–41)
Albumin: 3.2 g/dL — ABNORMAL LOW (ref 3.5–5.0)
Alkaline Phosphatase: 71 U/L (ref 38–126)
Anion gap: 10 (ref 5–15)
BUN: 34 mg/dL — ABNORMAL HIGH (ref 8–23)
CO2: 24 mmol/L (ref 22–32)
Calcium: 8.5 mg/dL — ABNORMAL LOW (ref 8.9–10.3)
Chloride: 109 mmol/L (ref 98–111)
Creatinine, Ser: 2.03 mg/dL — ABNORMAL HIGH (ref 0.61–1.24)
GFR, Estimated: 35 mL/min — ABNORMAL LOW (ref >60)
Glucose, Bld: 98 mg/dL (ref 70–99)
Potassium: 4.8 mmol/L (ref 3.5–5.1)
Sodium: 143 mmol/L (ref 135–145)
Total Bilirubin: 0.5 mg/dL (ref 0.3–1.2)
Total Protein: 6.9 g/dL (ref 6.5–8.1)

## 2022-12-12 LAB — GLUCOSE, CAPILLARY
Glucose-Capillary: 95 mg/dL (ref 70–99)
Glucose-Capillary: 146 mg/dL — ABNORMAL HIGH (ref 70–99)

## 2022-12-12 LAB — CBC
HCT: 36.9 % — ABNORMAL LOW (ref 39.0–52.0)
Hemoglobin: 10.6 g/dL — ABNORMAL LOW (ref 13.0–17.0)
MCH: 25.9 pg — ABNORMAL LOW (ref 26.0–34.0)
MCHC: 28.7 g/dL — ABNORMAL LOW (ref 30.0–36.0)
MCV: 90.2 fL (ref 80.0–100.0)
Platelets: 225 10*3/uL (ref 150–400)
RBC: 4.09 MIL/uL — ABNORMAL LOW (ref 4.22–5.81)
RDW: 15.3 % (ref 11.5–15.5)
WBC: 134.8 10*3/uL (ref 4.0–10.5)
nRBC: 0.1 % (ref 0.0–0.2)

## 2022-12-12 MED ORDER — AMOXICILLIN-POT CLAVULANATE 875-125 MG PO TABS: 1.0000 | ORAL_TABLET | Freq: Two times a day (BID) | ORAL | 0 refills | Status: AC

## 2022-12-12 MED ORDER — ACETAMINOPHEN 325 MG PO TABS: 650.0000 mg | ORAL_TABLET | Freq: Four times a day (QID) | ORAL | Status: DC | PRN

## 2022-12-12 MED ORDER — METFORMIN HCL ER 500 MG PO TB24: 500.0000 mg | ORAL_TABLET | Freq: Two times a day (BID) | ORAL | Status: AC

## 2022-12-12 NOTE — TOC Transition Note (Signed)
Transition of Care [facility]) - CM/SW Discharge Note   Patient Details  Name: Cody [name] MRN: [id number] Date of Birth: July 12, 1956  Transition of Care [facility]) CM/SW Contact:  Leeroy Cha, RN Phone Number: 12/12/2022, 10:42 AM   Clinical Narrative:    Patient discharged to go to [facility].  Harriet Pho notified.  Dc summary sent via the hub.   Will call ptar at 1100.   Final next level of care: Skilled Nursing Facility Barriers to Discharge: Barriers Resolved   Patient Goals and CMS Choice CMS Medicare.gov Compare Post Acute Care list provided to:: Patient    Discharge Placement                         Discharge Plan and Services Additional resources added to the After Visit Summary for     Discharge Planning Services: CM Consult                                 Social Determinants of Health (SDOH) Interventions SDOH Screenings   Food Insecurity: No Food Insecurity (12/05/2022)  Housing: Low Risk  (12/05/2022)  Transportation Needs: No Transportation Needs (12/05/2022)  Utilities: Not At Risk (12/05/2022)  Tobacco Use: Low Risk  (12/05/2022)     Readmission Risk Interventions   No data to display

## 2022-12-12 NOTE — Discharge Summary (Signed)
Physician Discharge Summary  Cody [name] RKY:[id number] DOB: [birth year] DOA: 12/04/2022  PCP: [name], MD  Admit date: 12/04/2022 Discharge date: 12/12/2022  Time spent: 37 minutes  Recommendations for Outpatient Follow-up:  Needs Chem-12 CBC in about 1 week Recommend slow initiation and graduation of diet from dysphagia 2--is very impulsive and have to be monitored when feeding Would recommend careful use of metformin 500 twice daily he has some azotemia and likely if his creatinine increases beyond current level he may need alternative Needs outpatient appointment for his CLL with Dr. 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 Lorenso Courier Prior EtOH, DM TY 2, HLD, gout At baseline requires minimal assistance-came to emergency room on 1/11 with underlying confusion and weakness unable to ambulate Had URI symptoms 12/26 and developed pneumonia Found to have healthcare associated pneumonia and admitted to inpatient and started on cefepime and vancomycin   Patient became confused and cefepime was broadened to [facility] Course:  HCAP - Narrowed to Augmentin twice daily 1/17 and continue to complete 10 days total on 1/20 -Saline lock -WBC still elevated but likely leukemoid type reaction to infection   AKI on admission, baseline creatinine 1.3 - PTA was on HCTZ 25/telmisartan 80 which has been  held secondary to azotemia and was not replaced - Seems to be getting closer to baseline, force fluids as above   Hyperkalemia on admission - Patient was given Lokelma earlier in hospital stay - Hyperkalemia resolved on next checks he may need repeat dosing of Lokelma in the outpatient setting if he continues to have mild hyperkalemia    Hypernatremia with free water deficit on admit - Trending down on D5 -labs improved significantly during hospital stay I have encouraged him to drink at least 2 L water on discharge - He still has some mild azotemia and I have encouraged him to drink more in the presence of nursing   DM TY 2 A1c 6.1 - CBGs ranging from 90-200 - Sliding scale to continue, - Linagliptin 5, metformin was changed to 500 twice daily because of creatinine and may need to be discontinued I will defer this to outpatient physician - He will continue on his usual dose of Toujeo twice daily at varying doses he may need lower dose as he is not eating as much   Underlying CLL - He is intolerant to Rituxan and will need outpatient discussion   Discharge Exam: Vitals:   12/11/22 2109 12/12/22 0523  BP: 132/63 123/67  Pulse: 93 84  Resp: 18 16  Temp: 97.6 F (36.4 C) (!) 97.2 F (36.2 C)  SpO2: 96% 93%    Subj on day of d/c   Awake coherent pleasant no distress seems comfortable no chest pain no fever No nausea no vomiting eating drinking No edema  General Exam on discharge  EOMI NCAT interactive no distress CTAB no added sound no rales rhonchi wheeze ROM intact no lower extremity edema Neurologically intact no focal deficit  Power 5/5  Discharge Instructions   Discharge Instructions     Diet - low sodium heart healthy   Complete by: As directed    Increase activity slowly   Complete by: As directed       Allergies as of 12/12/2022       Reactions   Rituxan [rituximab] Other (See Comments)   "Shaking all over and diaphoretic"        Medication List      STOP taking these medications    hydrochlorothiazide 25 MG tablet Commonly known as: HYDRODIURIL   telmisartan 80 MG tablet Commonly known as: MICARDIS       TAKE these medications    Accu-Chek Guide w/Device Kit   acetaminophen 325 MG tablet Commonly known as: TYLENOL Take 2 tablets (650 mg total) by mouth every 6 (six) hours as needed for mild pain (or Fever >/= 101).   allopurinol 300 MG tablet Commonly known as: ZYLOPRIM TAKE 1 TABLET DAILY What changed: when to take this   amLODipine 10 MG tablet Commonly known as: NORVASC Take 10 mg by mouth at bedtime.   amoxicillin-clavulanate 875-125 MG tablet Commonly known as: AUGMENTIN Take 1 tablet by mouth every 12 (twelve) hours for 3 doses.   atorvastatin 10 MG tablet Commonly known as: LIPITOR Take 10 mg by mouth at bedtime.   B-D UF III MINI PEN NEEDLES 31G X 5 MM Misc Generic drug: Insulin Pen Needle   fexofenadine 180 MG tablet Commonly known as: ALLEGRA Take 180 mg by mouth at bedtime.   guaiFENesin-codeine 100-10 MG/5ML syrup Take 5-10 mLs by mouth every 4 (four) hours as needed for cough.   linagliptin 5 MG Tabs tablet Commonly known as: TRADJENTA Take 5 mg by mouth in the morning.   metFORMIN 500 MG 24 hr tablet Commonly known as: GLUCOPHAGE-XR Take 1 tablet (500 mg total) by mouth 2 (two) times daily with a meal. Take 500 mg by mouth in the morning and 1,000 mg at bedtime What changed:  how much to take when to take this   Norel AD 4-10-325 MG Tabs Generic drug: Chlorphen-PE-Acetaminophen Take 1 tablet by mouth 3 (three) times daily.   ondansetron 4 MG tablet Commonly known as: ZOFRAN TAKE 1 TABLET BY MOUTH EVERY 8 HOURS AS NEEDED FOR NAUSEA AND VOMITING   OneTouch Delica Lancets Fine Misc 3 (three) times daily.   Toujeo SoloStar 300 UNIT/ML Solostar Pen Generic drug: insulin glargine (1 Unit Dial) Inject 5-30 Units into the skin See admin instructions. Inject 5-15 units into the  skin in the morning and 30 units at bedtime, PER SLIDING SCALE (FOR BOTH)       Allergies  Allergen Reactions   Rituxan [Rituximab] Other (See Comments)    "Shaking all over and diaphoretic"      The results of significant diagnostics from this hospitalization (including imaging, microbiology, ancillary and laboratory) are listed below for reference.    Significant Diagnostic Studies: CT CHEST WO CONTRAST  Result Date: 12/08/2022 CLINICAL DATA:  Pneumonia, complication suspected, xray done worsening pneumonia EXAM: CT CHEST WITHOUT CONTRAST TECHNIQUE: Multidetector CT imaging of the chest was performed following the standard protocol without IV contrast. RADIATION DOSE REDUCTION: This exam was performed according to the departmental dose-optimization program which includes automated exposure control, adjustment of the mA and/or kV according to patient size and/or use of iterative reconstruction technique. COMPARISON:  Chest x-ray 12/04/2022.  PET CT 02/16/2020 FINDINGS: Cardiovascular: Heart is normal size.  Aorta normal caliber. Mediastinum/Nodes:  Enlarged mediastinal lymph nodes, worsening since prior PET CT. Index subcarinal lymph node has a short axis diameter of 17 mm compared to 9 mm previously. AP window lymph node has a short axis diameter of 15 mm compared to 4 mm previously. Numerous other enlarged mediastinal lymph nodes in the prevascular, pretracheal and right paratracheal regions. Difficult to assess for hilar adenopathy without IV contrast. Enlarged bilateral axillary lymph nodes. Index left axillary lymph node has a short axis diameter of 13 mm compared to 11 mm previously. Index right axillary lymph node has a short axis diameter of 14 mm compared with 14 mm previously. Lungs/Pleura: Airspace disease noted in both lower lobes, right greater than left as well as the right middle lobe concerning for pneumonia. No effusions. Upper Abdomen: Mildly prominent upper abdominal lymph nodes  in the gastrohepatic ligament. Index node has a short axis diameter of 7 mm compared to 4 mm previously. Musculoskeletal: Chest wall soft tissues are unremarkable. No acute bony abnormality. IMPRESSION: Consolidation in both lower lobes, right greater than left as well as the right middle lobe most compatible with multifocal pneumonia. Mediastinal adenopathy, worsening since prior PET CT. Stable or slightly larger bilateral axillary lymph nodes and upper abdominal lymph nodes. Findings concerning for lymphoproliferative disorder in this patient with history of CLL. Recommend oncologic consultation. Electronically Signed   By: Rolm Baptise M.D.   On: 12/08/2022 00:24   DG Chest Port 1 View  Result Date: 12/04/2022 CLINICAL DATA:  Sepsis. EXAM: PORTABLE CHEST 1 VIEW COMPARISON:  X-ray 12/06/2013 FINDINGS: Overlapping cardiac leads. Subtle opacity at the right lung base. Infiltrate is possible. Recommend follow-up. No pneumothorax, effusion or edema. Film is rotated to the right and there is patient tilt. Normal cardiopericardial silhouette when adjusted for technique. IMPRESSION: Subtle right lung base opacity. Possible infiltrate. Recommend follow-up Electronically Signed   By: Jill Side M.D.   On: 12/04/2022 10:05    Microbiology: Recent Results (from the past 240 hour(s))  MRSA Next Gen by PCR, Nasal     Status: None   Collection Time: 12/04/22  8:31 AM   Specimen: Nasal Mucosa; Nasal Swab  Result Value Ref Range Status   MRSA by PCR Next Gen NOT DETECTED NOT DETECTED Final    Comment: (NOTE) The GeneXpert MRSA Assay (FDA approved for NASAL specimens only), is one component of a comprehensive MRSA colonization surveillance program. It is not intended to diagnose MRSA infection nor to guide or monitor treatment for MRSA infections. Test performance is not FDA approved in patients less than 49 years old. Performed at [facility], [place] [address] [place], [place] [address]    Resp panel by RT-PCR (RSV, Flu A&B, Covid) Anterior Nasal Swab     Status: None   Collection Time: 12/04/22  9:03 AM   Specimen: Anterior Nasal Swab  Result Value Ref Range Status   SARS Coronavirus 2 by RT PCR NEGATIVE NEGATIVE Final    Comment: (NOTE) SARS-CoV-2 target nucleic acids are NOT DETECTED.  The SARS-CoV-2 RNA is generally detectable in upper respiratory specimens during the acute phase of infection. The lowest concentration of SARS-CoV-2 viral copies this assay can detect is 138 copies/mL. A negative result does not preclude SARS-Cov-2 infection and should not be used as the sole basis for treatment or other patient management decisions. A negative result may occur with  improper specimen collection/handling, submission of specimen other than nasopharyngeal swab, presence of viral mutation(s) within the areas targeted by this assay, and  inadequate number of viral copies(<138 copies/mL). A negative result must be combined with clinical observations, patient history, and epidemiological information. The expected result is Negative.  Fact Sheet for Patients:  EntrepreneurPulse.com.au  Fact Sheet for Healthcare Providers:  IncredibleEmployment.be  This test is no t yet approved or cleared by the [place] FDA and  has been authorized for detection and/or diagnosis of SARS-CoV-2 by FDA under an Emergency Use Authorization (EUA). This EUA will remain  in effect (meaning this test can be used) for the duration of the COVID-19 declaration under Section 564(b)(1) of the Act, 21 U.S.C.section 360bbb-3(b)(1), unless the authorization is terminated  or revoked sooner.       Influenza A by PCR NEGATIVE NEGATIVE Final   Influenza B by PCR NEGATIVE NEGATIVE Final    Comment: (NOTE) The Xpert Xpress SARS-CoV-2/FLU/RSV plus assay is intended as an aid in the diagnosis of influenza from Nasopharyngeal swab specimens and should not be used  as a sole basis for treatment. Nasal washings and aspirates are unacceptable for Xpert Xpress SARS-CoV-2/FLU/RSV testing.  Fact Sheet for Patients: EntrepreneurPulse.com.au  Fact Sheet for Healthcare Providers: IncredibleEmployment.be  This test is not yet approved or cleared by the [place] FDA and has been authorized for detection and/or diagnosis of SARS-CoV-2 by FDA under an Emergency Use Authorization (EUA). This EUA will remain in effect (meaning this test can be used) for the duration of the COVID-19 declaration under Section 564(b)(1) of the Act, 21 U.S.C. section 360bbb-3(b)(1), unless the authorization is terminated or revoked.     Resp Syncytial Virus by PCR NEGATIVE NEGATIVE Final    Comment: (NOTE) Fact Sheet for Patients: EntrepreneurPulse.com.au  Fact Sheet for Healthcare Providers: IncredibleEmployment.be  This test is not yet approved or cleared by the [place] FDA and has been authorized for detection and/or diagnosis of SARS-CoV-2 by FDA under an Emergency Use Authorization (EUA). This EUA will remain in effect (meaning this test can be used) for the duration of the COVID-19 declaration under Section 564(b)(1) of the Act, 21 U.S.C. section 360bbb-3(b)(1), unless the authorization is terminated or revoked.  Performed at [facility], [place] [address] [place], [place] [address]   Blood Culture (routine x 2)     Status: None   Collection Time: 12/04/22  9:10 AM   Specimen: BLOOD  Result Value Ref Range Status   Specimen Description   Final    BLOOD SITE NOT SPECIFIED Performed at [place] [address] [place], [place] [address]    Special Requests   Final    BOTTLES DRAWN AEROBIC AND ANAEROBIC Blood Culture adequate volume Performed at [place] [address] [place], [place] [address]    Culture   Final     NO GROWTH 5 DAYS Performed at [facility], [place] [address], [place], [place] [address]    Report Status 12/09/2022 FINAL  Final  Blood Culture (routine x 2)     Status: None   Collection Time: 12/04/22  9:15 AM   Specimen: BLOOD  Result Value Ref Range Status   Specimen Description   Final    BLOOD SITE NOT SPECIFIED Performed at [place] [address] [place], [place] [address]    Special Requests   Final    BOTTLES DRAWN AEROBIC AND ANAEROBIC Blood Culture adequate volume Performed at [place] [address] [place],  [address]    Culture   Final  NO GROWTH 5 DAYS Performed at [facility], [place] [address] [place], [place] [address]    Report Status 12/09/2022 FINAL  Final  Urine Culture     Status: None   Collection Time: 12/04/22  2:49 PM   Specimen: In/Out Cath Urine  Result Value Ref Range Status   Specimen Description   Final    IN/OUT CATH URINE Performed at [place] [address] [place], [place] [address]    Special Requests   Final    NONE Performed at [facility], [place] [address], [place], [place] [address]    Culture   Final    NO GROWTH Performed at [facility], [place] [address] [place], [place] [address]    Report Status 12/05/2022 FINAL  Final     Labs: Basic Metabolic Panel: Recent Labs  Lab 12/07/22 0553 12/08/22 0627 12/09/22 0949 12/11/22 0436 12/12/22 0441  NA 147* 145 140 139 143  K 4.0 3.9 4.0 5.3* 4.8  CL 113* 109 106 108 109  CO2 '25 27 26 22 24  '$ GLUCOSE 116* 171* 198* 108* 98  BUN 33* 35* 28* 26* 34*  CREATININE 1.54* 1.98* 1.89* 2.05* 2.03*  CALCIUM 8.9 8.6* 8.0* 8.2* 8.5*   Liver Function Tests: Recent Labs  Lab 12/06/22 0640 12/07/22 0553 12/11/22 0436 12/12/22 0441  AST 28 34 40 33  ALT 29 32 48* 45*  ALKPHOS 56 67 71 71  BILITOT 0.3 0.7 0.6 0.5  PROT 6.3* 6.6 7.1 6.9  ALBUMIN 3.0* 3.2* 3.2*  3.2*   No results for input(s): "LIPASE", "AMYLASE" in the last 168 hours. No results for input(s): "AMMONIA" in the last 168 hours. CBC: Recent Labs  Lab 12/06/22 0640 12/07/22 0553 12/08/22 0627 12/09/22 0501 12/10/22 0531 12/11/22 0436 12/12/22 0441  WBC 114.9* 120.2* 137.8* 126.0* 123.2* 132.8* 134.8*  NEUTROABS 8.3* 8.0* 12.4* 10.2* 8.6*  --   --   HGB 9.8* 11.2* 11.1* 10.7* 9.9* 11.0* 10.6*  HCT 34.4* 39.3 38.4* 37.4* 35.1* 38.3* 36.9*  MCV 91.7 93.6 90.6 91.0 90.7 89.9 90.2  PLT 191 216 228 198 206 212 225   Cardiac Enzymes: No results for input(s): "CKTOTAL", "CKMB", "CKMBINDEX", "TROPONINI" in the last 168 hours. BNP: BNP (last 3 results) No results for input(s): "BNP" in the last 8760 hours.  ProBNP (last 3 results) No results for input(s): "PROBNP" in the last 8760 hours.  CBG: Recent Labs  Lab 12/11/22 0741 12/11/22 1139 12/11/22 1632 12/11/22 2106 12/12/22 0738  GLUCAP 86 95 129* 195* 95       Signed:  Nita Sells MD   [facility] 12/12/2022, 8:14 AM

## 2022-12-15 ENCOUNTER — Encounter: Payer: Self-pay | Admitting: Hematology and Oncology

## 2022-12-15 DIAGNOSIS — E875 Hyperkalemia: Secondary | ICD-10-CM | POA: Diagnosis not present

## 2022-12-15 DIAGNOSIS — E119 Type 2 diabetes mellitus without complications: Secondary | ICD-10-CM | POA: Diagnosis not present

## 2022-12-15 DIAGNOSIS — E785 Hyperlipidemia, unspecified: Secondary | ICD-10-CM | POA: Diagnosis not present

## 2022-12-15 DIAGNOSIS — C9110 Chronic lymphocytic leukemia of B-cell type not having achieved remission: Secondary | ICD-10-CM | POA: Diagnosis not present

## 2022-12-16 ENCOUNTER — Other Ambulatory Visit: Payer: Self-pay | Admitting: Physician Assistant

## 2022-12-16 DIAGNOSIS — C9110 Chronic lymphocytic leukemia of B-cell type not having achieved remission: Secondary | ICD-10-CM

## 2022-12-17 ENCOUNTER — Inpatient Hospital Stay: Payer: Medicare PPO | Attending: Hematology and Oncology

## 2022-12-17 ENCOUNTER — Inpatient Hospital Stay: Payer: Medicare PPO | Admitting: Physician Assistant

## 2022-12-17 ENCOUNTER — Telehealth: Payer: Self-pay

## 2022-12-17 VITALS — BP 143/77 | HR 88 | Temp 97.8°F | Resp 19 | Ht 72.0 in | Wt 199.4 lb

## 2022-12-17 DIAGNOSIS — C9110 Chronic lymphocytic leukemia of B-cell type not having achieved remission: Secondary | ICD-10-CM

## 2022-12-17 LAB — CMP (CANCER CENTER ONLY)
ALT: 42 U/L (ref 0–44)
AST: 30 U/L (ref 15–41)
Albumin: 3.9 g/dL (ref 3.5–5.0)
Alkaline Phosphatase: 90 U/L (ref 38–126)
Anion gap: 9 (ref 5–15)
BUN: 26 mg/dL — ABNORMAL HIGH (ref 8–23)
CO2: 26 mmol/L (ref 22–32)
Calcium: 9 mg/dL (ref 8.9–10.3)
Chloride: 109 mmol/L (ref 98–111)
Creatinine: 1.82 mg/dL — ABNORMAL HIGH (ref 0.61–1.24)
GFR, Estimated: 40 mL/min — ABNORMAL LOW (ref >60)
Glucose, Bld: 83 mg/dL (ref 70–99)
Potassium: 5 mmol/L (ref 3.5–5.1)
Sodium: 144 mmol/L (ref 135–145)
Total Bilirubin: 0.5 mg/dL (ref 0.3–1.2)
Total Protein: 7.9 g/dL (ref 6.5–8.1)

## 2022-12-17 LAB — CBC WITH DIFFERENTIAL (CANCER CENTER ONLY)
Abs Immature Granulocytes: 0.45 10*3/uL — ABNORMAL HIGH (ref 0.00–0.07)
Basophils Absolute: 0.1 10*3/uL (ref 0.0–0.1)
Basophils Relative: 0 %
Eosinophils Absolute: 0.2 10*3/uL (ref 0.0–0.5)
Eosinophils Relative: 0 %
HCT: 40.7 % (ref 39.0–52.0)
Hemoglobin: 11.9 g/dL — ABNORMAL LOW (ref 13.0–17.0)
Immature Granulocytes: 0 %
Lymphocytes Relative: 94 %
Lymphs Abs: 136.4 10*3/uL — ABNORMAL HIGH (ref 0.7–4.0)
MCH: 26.4 pg (ref 26.0–34.0)
MCHC: 29.2 g/dL — ABNORMAL LOW (ref 30.0–36.0)
MCV: 90.2 fL (ref 80.0–100.0)
Monocytes Absolute: 3 10*3/uL — ABNORMAL HIGH (ref 0.1–1.0)
Monocytes Relative: 2 %
Neutro Abs: 6.1 10*3/uL (ref 1.7–7.7)
Neutrophils Relative %: 4 %
Platelet Count: 221 10*3/uL (ref 150–400)
RBC: 4.51 MIL/uL (ref 4.22–5.81)
RDW: 16 % — ABNORMAL HIGH (ref 11.5–15.5)
WBC Count: 146.2 10*3/uL (ref 4.0–10.5)
nRBC: 0.2 % (ref 0.0–0.2)

## 2022-12-17 MED ORDER — GUAIFENESIN-CODEINE 100-10 MG/5ML PO SOLN: 5.0000 mL | ORAL | 0 refills | Status: DC | PRN

## 2022-12-17 NOTE — Telephone Encounter (Signed)
CRITICAL VALUE STICKER  CRITICAL VALUE:   WBC 146.2  RECEIVER (on-site recipient of call):  Bonnita Nasuti  DATE & TIME NOTIFIED: 03:06  MESSENGER (representative from lab):  Heather  MD NOTIFIED: Charlies Silvers  TIME OF NOTIFICATION:  03:06  RESPONSE:

## 2022-12-22 DIAGNOSIS — E440 Moderate protein-calorie malnutrition: Secondary | ICD-10-CM | POA: Diagnosis not present

## 2022-12-22 DIAGNOSIS — K7030 Alcoholic cirrhosis of liver without ascites: Secondary | ICD-10-CM | POA: Diagnosis not present

## 2022-12-22 DIAGNOSIS — I10 Essential (primary) hypertension: Secondary | ICD-10-CM | POA: Diagnosis not present

## 2022-12-22 DIAGNOSIS — C9110 Chronic lymphocytic leukemia of B-cell type not having achieved remission: Secondary | ICD-10-CM | POA: Diagnosis not present

## 2022-12-22 NOTE — Progress Notes (Signed)
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   (2) on maintenance rituximab, first dose 03/07/2019, repeated every 3 months             (a) baseline PET scan obtained on 02/16/2020 was [place] 2 except for the left iliac nodes, with SUV max 3.1             (b) rituximab decreased to every 6 months beginning October 2021    Interval History:  Cody [name] 67 y.o. male with medical history significant for CLL previously on monotherapy rituximab who presents for a follow up visit. The patient's last visit was on 06/02/2022. In the interim since the last visit he has not had any major changes in his health.   On exam today Cody [name] reports he is recovering after recnt hospitalization for pneumonia and AKI. His energy levels are almost back to baseline. He completed his antibiotic therapy. He was staying a skilled facility but sister shares that he will be living at home from now on. He denies nausea, vomiting or abdominal pain. Bowel habits are unchanged. He denies easy bruising or sigs of active bleeding. He denies fevers, chills, sweats, shortness of breath, chest pain or cough. He has no other complaints. A full 10 point ROS is listed below.  MEDICAL HISTORY:  Past Medical History:  Diagnosis Date   Alcoholic cirrhosis ([place])    Cataract    Diabetes mellitus without complication ([place])    High blood pressure 10/24/2004   History of alcohol abuse    Hypercholesterolemia     SURGICAL HISTORY: No  past surgical history on file.  SOCIAL HISTORY: Social History   Socioeconomic History   Marital status: Divorced    Spouse name: Not on file   Number of children: Not on file   Years of education: Not on file   Highest education level: Not on file  Occupational History   Not on file  Tobacco Use   Smoking status: Never   Smokeless tobacco: Never  Substance and Sexual Activity   Alcohol use: Not Currently   Drug use: No   Sexual activity: Not on file  Other Topics Concern   Not on file  Social History Narrative   Not on file   Social Determinants of Health   Financial Resource Strain: Not on file  Food Insecurity: No Food Insecurity (12/05/2022)   Hunger Vital Sign    Worried About Running Out of Food in the Last Year: Never true    Ran Out of Food in the Last Year: Never true  Transportation Needs: No Transportation Needs (12/05/2022)   PRAPARE - Hydrologist (Medical): No    Lack of Transportation (Non-Medical): No  Physical Activity: Not on file  Stress: Not on file  Social Connections: Not on file  Intimate Partner Violence: Not At Risk (12/05/2022)   Humiliation, Afraid, Rape, and Kick questionnaire  Fear of Current or Ex-Partner: No    Emotionally Abused: No    Physically Abused: No    Sexually Abused: No    FAMILY HISTORY: Family History  Problem Relation Age of Onset   Alcohol abuse Father    Cirrhosis Father    Coronary artery disease Father     ALLERGIES:  is allergic to rituxan [rituximab].  MEDICATIONS:  Current Outpatient Medications  Medication Sig Dispense Refill   acetaminophen (TYLENOL) 325 MG tablet Take 2 tablets (650 mg total) by mouth every 6 (six) hours as needed for mild pain (or Fever >/= 101).     allopurinol (ZYLOPRIM) 300 MG tablet TAKE 1 TABLET DAILY (Patient taking differently: Take 300 mg by mouth in the morning.) 90 tablet 3   amLODipine (NORVASC) 10 MG tablet Take 10 mg by mouth at bedtime.      atorvastatin (LIPITOR) 10 MG tablet Take 10 mg by mouth at bedtime.     B-D UF III MINI PEN NEEDLES 31G X 5 MM MISC      Blood Glucose Monitoring Suppl (ACCU-CHEK GUIDE) w/Device KIT      fexofenadine (ALLEGRA) 180 MG tablet Take 180 mg by mouth at bedtime.     linagliptin (TRADJENTA) 5 MG TABS tablet Take 5 mg by mouth in the morning.     metFORMIN (GLUCOPHAGE-XR) 500 MG 24 hr tablet Take 1 tablet (500 mg total) by mouth 2 (two) times daily with a meal. Take 500 mg by mouth in the morning and 1,000 mg at bedtime     NOREL AD 4-10-325 MG TABS Take 1 tablet by mouth 3 (three) times daily.     ONETOUCH DELICA LANCETS FINE MISC 3 (three) times daily.   5   TOUJEO SOLOSTAR 300 UNIT/ML SOPN Inject 5-30 Units into the skin See admin instructions. Inject 5-15 units into the skin in the morning and 30 units at bedtime, PER SLIDING SCALE (FOR BOTH)     guaiFENesin-codeine 100-10 MG/5ML syrup Take 5-10 mLs by mouth every 4 (four) hours as needed for cough. 120 mL 0   ondansetron (ZOFRAN) 4 MG tablet TAKE 1 TABLET BY MOUTH EVERY 8 HOURS AS NEEDED FOR NAUSEA AND VOMITING (Patient not taking: Reported on 12/04/2022) 8 tablet 2   No current facility-administered medications for this visit.    REVIEW OF SYSTEMS:   Constitutional: ( - ) fevers, ( - )  chills , ( - ) night sweats Eyes: ( - ) blurriness of vision, ( - ) double vision, ( - ) watery eyes Ears, nose, mouth, throat, and face: ( - ) mucositis, ( - ) sore throat Respiratory: ( - ) cough, ( - ) dyspnea, ( - ) wheezes Cardiovascular: ( - ) palpitation, ( - ) chest discomfort, ( - ) lower extremity swelling Gastrointestinal:  ( - ) nausea, ( - ) heartburn, ( - ) change in bowel habits Skin: ( - ) abnormal skin rashes Lymphatics: ( - ) new lymphadenopathy, ( - ) easy bruising Neurological: ( - ) numbness, ( - ) tingling, ( - ) new weaknesses Behavioral/Psych: ( - ) mood change, ( - ) new changes  All other systems were reviewed with the patient and are  negative.  PHYSICAL EXAMINATION: ECOG PERFORMANCE STATUS: 1 - Symptomatic but completely ambulatory  Vitals:   12/17/22 1500  BP: (!) 143/77  Pulse: 88  Resp: 19  Temp: 97.8 F (36.6 C)  SpO2: 97%    Filed Weights   12/17/22 1500  Weight: 199 lb 6.4 oz (90.4 kg)     GENERAL: Well-appearing elderly male, alert, no distress and comfortable SKIN: skin color, texture, turgor are normal, no rashes or significant lesions EYES: conjunctiva are pink and non-injected, sclera clear NECK: supple, non-tender LYMPH:  no palpable lymphadenopathy in the cervical or supraclavicular regions.  LUNGS: clear to auscultation and percussion with normal breathing effort HEART: regular rate & rhythm and no murmurs and no lower extremity edema Musculoskeletal: no cyanosis of digits and no clubbing  PSYCH: alert & oriented x 3, fluent speech NEURO: no focal motor/sensory deficits  LABORATORY DATA:  I have reviewed the data as listed    Latest Ref Rng & Units 12/17/2022    2:27 PM 12/12/2022    4:41 AM 12/11/2022    4:36 AM  CBC  WBC 4.0 - 10.5 K/uL 146.2  134.8  132.8   Hemoglobin 13.0 - 17.0 g/dL 11.9  10.6  11.0   Hematocrit 39.0 - 52.0 % 40.7  36.9  38.3   Platelets 150 - 400 K/uL 221  225  212        Latest Ref Rng & Units 12/17/2022    2:27 PM 12/12/2022    4:41 AM 12/11/2022    4:36 AM  CMP  Glucose 70 - 99 mg/dL 83  98  108   BUN 8 - 23 mg/dL 26  34  26   Creatinine 0.61 - 1.24 mg/dL 1.82  2.03  2.05   Sodium 135 - 145 mmol/L 144  143  139   Potassium 3.5 - 5.1 mmol/L 5.0  4.8  5.3   Chloride 98 - 111 mmol/L 109  109  108   CO2 22 - 32 mmol/L '26  24  22   '$ Calcium 8.9 - 10.3 mg/dL 9.0  8.5  8.2   Total Protein 6.5 - 8.1 g/dL 7.9  6.9  7.1   Total Bilirubin 0.3 - 1.2 mg/dL 0.5  0.5  0.6   Alkaline Phos 38 - 126 U/L 90  71  71   AST 15 - 41 U/L 30  33  40   ALT 0 - 44 U/L 42  45  48     RADIOGRAPHIC STUDIES: CT CHEST WO CONTRAST  Result Date: 12/08/2022 CLINICAL DATA:   Pneumonia, complication suspected, xray done worsening pneumonia EXAM: CT CHEST WITHOUT CONTRAST TECHNIQUE: Multidetector CT imaging of the chest was performed following the standard protocol without IV contrast. RADIATION DOSE REDUCTION: This exam was performed according to the departmental dose-optimization program which includes automated exposure control, adjustment of the mA and/or kV according to patient size and/or use of iterative reconstruction technique. COMPARISON:  Chest x-ray 12/04/2022.  PET CT 02/16/2020 FINDINGS: Cardiovascular: Heart is normal size.  Aorta normal caliber. Mediastinum/Nodes: Enlarged mediastinal lymph nodes, worsening since prior PET CT. Index subcarinal lymph node has a short axis diameter of 17 mm compared to 9 mm previously. AP window lymph node has a short axis diameter of 15 mm compared to 4 mm previously. Numerous other enlarged mediastinal lymph nodes in the prevascular, pretracheal and right paratracheal regions. Difficult to assess for hilar adenopathy without IV contrast. Enlarged bilateral axillary lymph nodes. Index left axillary lymph node has a short axis diameter of 13 mm compared to 11 mm previously. Index right axillary lymph node has a short axis diameter of 14 mm compared with 14 mm previously. Lungs/Pleura: Airspace disease noted in both lower lobes, right greater than left as well as the right  middle lobe concerning for pneumonia. No effusions. Upper Abdomen: Mildly prominent upper abdominal lymph nodes in the gastrohepatic ligament. Index node has a short axis diameter of 7 mm compared to 4 mm previously. Musculoskeletal: Chest wall soft tissues are unremarkable. No acute bony abnormality. IMPRESSION: Consolidation in both lower lobes, right greater than left as well as the right middle lobe most compatible with multifocal pneumonia. Mediastinal adenopathy, worsening since prior PET CT. Stable or slightly larger bilateral axillary lymph nodes and upper abdominal  lymph nodes. Findings concerning for lymphoproliferative disorder in this patient with history of CLL. Recommend oncologic consultation. Electronically Signed   By: Rolm Baptise M.D.   On: 12/08/2022 00:24   DG Chest Port 1 View  Result Date: 12/04/2022 CLINICAL DATA:  Sepsis. EXAM: PORTABLE CHEST 1 VIEW COMPARISON:  X-ray 12/06/2013 FINDINGS: Overlapping cardiac leads. Subtle opacity at the right lung base. Infiltrate is possible. Recommend follow-up. No pneumothorax, effusion or edema. Film is rotated to the right and there is patient tilt. Normal cardiopericardial silhouette when adjusted for technique. IMPRESSION: Subtle right lung base opacity. Possible infiltrate. Recommend follow-up Electronically Signed   By: Jill Side M.D.   On: 12/04/2022 10:05    ASSESSMENT & PLAN Cody [name] is a 67 y.o. male with medical history significant for CLL who presents for a follow up visit.   # CLL --patient underwent approximately 2 year of monotherapy rituximab followed by maintenance rituximab. Discontinued due to poor tolerance with medication.  --Currently on surveillance.  --Labs show marked increase in WBC from last six months. WBC 32.6 on 05/24/2022 to 146.2 today. Remaining labs are stable with Hgb 11.9, Plt 221.  --Discussed with Dr. Lorenso Courier who does not recommend to initiate therapy since criteria is not met with anemia or thrombocytopenia. Additionally, recent pneumonia could be contributing to his leukocytosis.  --Recommend close monitor and repeat in 3 months to repeat labs.  --In the event he was found to have progression of his CLL would recommend treatment with ibrutinib or acalabrutinib --Return to clinic in 3 months time for further evaluation  No orders of the defined types were placed in this encounter.   All questions were answered. The patient knows to call the clinic with any problems, questions or concerns.  I have spent a total of 25 minutes minutes of face-to-face and  non-face-to-face time, preparing to see the patient, performing a medically appropriate examination, counseling and educating the patient, documenting clinical information in the electronic health record,and care coordination.   Dede Query PA-C Dept of Hematology and [place] at [facility] Phone: [phone]   12/22/2022 6:19 AM  Kristian Covey BD, Norm Parcel, [place]Cappio F, Dighiero G, Dhner H, [place], [place], [place] E, Chiorazzi N, [place], Rai KR, [place], Eichhor[place] B, O'Brien S, Robak T, Se[place] JF, [place] TJ. iwCLL guidelines for diagnosis, indications for treatment, response assessment, and supportive management of CLL. Blood. 2018 Jun 21;131(25):2745-2760.  Active disease should be clearly documented to initiate therapy. At least 1 of the following criteria should be met.  1) Evidence of progressive marrow failure as manifested by the development of, or worsening of, anemia and/or thrombocytopenia. Cutoff levels of Hb <10 g/dL or platelet counts <100  109/L are generally regarded as indication for treatment. However, in some patients, platelet counts <100  109/L may remain stable over a long period; this situation does not automatically require therapeutic intervention. 2) Massive (ie, ?6 cm below the left costal  margin) or progressive or symptomatic splenomegaly. 3) Massive nodes (ie, ?10 cm in longest diameter) or progressive or symptomatic lymphadenopathy. 4) Progressive lymphocytosis with an increase of ?50% over a 7-monthperiod, or lymphocyte doubling time (LDT) <6 months. LDT can be obtained by linear regression extrapolation of absolute lymphocyte counts obtained at intervals of 2 weeks over an observation period of 2 to 3 months; patients with initial blood lymphocyte counts <30  109/L may require a longer observation period to determine the LDT. Factors contributing to lymphocytosis other than CLL (eg, infections,  steroid administration) should be excluded. 5) Autoimmune complications including anemia or thrombocytopenia poorly responsive to corticosteroids. 6) Symptomatic or functional extranodal involvement (eg, skin, kidney, lung, spine). Disease-related symptoms as defined by any of the following: Unintentional weight loss ?10% within the previous 6 months. Significant fatigue (ie, ECOG performance scale 2 or worse; cannot work or unable to perform usual activities). Fevers ?100.53F or 38.0C for 2 or more weeks without evidence of infection. Night sweats for ?1 month without evidence of infection.

## 2022-12-23 DIAGNOSIS — F70 Mild intellectual disabilities: Secondary | ICD-10-CM | POA: Diagnosis not present

## 2022-12-23 DIAGNOSIS — I10 Essential (primary) hypertension: Secondary | ICD-10-CM | POA: Diagnosis not present

## 2022-12-23 DIAGNOSIS — J189 Pneumonia, unspecified organism: Secondary | ICD-10-CM | POA: Diagnosis not present

## 2022-12-23 DIAGNOSIS — E1122 Type 2 diabetes mellitus with diabetic chronic kidney disease: Secondary | ICD-10-CM | POA: Diagnosis not present

## 2022-12-23 DIAGNOSIS — C9110 Chronic lymphocytic leukemia of B-cell type not having achieved remission: Secondary | ICD-10-CM | POA: Diagnosis not present

## 2022-12-24 ENCOUNTER — Ambulatory Visit: Payer: Medicare PPO | Admitting: Podiatry

## 2022-12-30 ENCOUNTER — Ambulatory Visit: Payer: Medicare PPO | Admitting: Physician Assistant

## 2022-12-30 ENCOUNTER — Other Ambulatory Visit: Payer: Medicare PPO

## 2023-01-07 ENCOUNTER — Ambulatory Visit: Payer: Medicare PPO | Admitting: Podiatry

## 2023-01-13 DIAGNOSIS — I10 Essential (primary) hypertension: Secondary | ICD-10-CM | POA: Diagnosis not present

## 2023-01-13 DIAGNOSIS — E1122 Type 2 diabetes mellitus with diabetic chronic kidney disease: Secondary | ICD-10-CM | POA: Diagnosis not present

## 2023-01-13 DIAGNOSIS — Z6825 Body mass index (BMI) 25.0-25.9, adult: Secondary | ICD-10-CM | POA: Diagnosis not present

## 2023-01-13 DIAGNOSIS — I129 Hypertensive chronic kidney disease with stage 1 through stage 4 chronic kidney disease, or unspecified chronic kidney disease: Secondary | ICD-10-CM | POA: Diagnosis not present

## 2023-01-13 DIAGNOSIS — C9110 Chronic lymphocytic leukemia of B-cell type not having achieved remission: Secondary | ICD-10-CM | POA: Diagnosis not present

## 2023-01-13 DIAGNOSIS — N182 Chronic kidney disease, stage 2 (mild): Secondary | ICD-10-CM | POA: Diagnosis not present

## 2023-01-13 DIAGNOSIS — E1169 Type 2 diabetes mellitus with other specified complication: Secondary | ICD-10-CM | POA: Diagnosis not present

## 2023-01-13 DIAGNOSIS — J189 Pneumonia, unspecified organism: Secondary | ICD-10-CM | POA: Diagnosis not present

## 2023-01-13 DIAGNOSIS — Z794 Long term (current) use of insulin: Secondary | ICD-10-CM | POA: Diagnosis not present

## 2023-01-13 DIAGNOSIS — F70 Mild intellectual disabilities: Secondary | ICD-10-CM | POA: Diagnosis not present

## 2023-01-22 DIAGNOSIS — C9110 Chronic lymphocytic leukemia of B-cell type not having achieved remission: Secondary | ICD-10-CM | POA: Diagnosis not present

## 2023-01-22 DIAGNOSIS — I129 Hypertensive chronic kidney disease with stage 1 through stage 4 chronic kidney disease, or unspecified chronic kidney disease: Secondary | ICD-10-CM | POA: Diagnosis not present

## 2023-01-22 DIAGNOSIS — E1122 Type 2 diabetes mellitus with diabetic chronic kidney disease: Secondary | ICD-10-CM | POA: Diagnosis not present

## 2023-01-22 DIAGNOSIS — N183 Chronic kidney disease, stage 3 unspecified: Secondary | ICD-10-CM | POA: Diagnosis not present

## 2023-02-18 ENCOUNTER — Encounter: Payer: Self-pay | Admitting: Podiatry

## 2023-02-18 ENCOUNTER — Ambulatory Visit (INDEPENDENT_AMBULATORY_CARE_PROVIDER_SITE_OTHER): Payer: Medicare PPO | Admitting: Podiatry

## 2023-02-18 VITALS — BP 146/62 | HR 77

## 2023-02-18 DIAGNOSIS — E1151 Type 2 diabetes mellitus with diabetic peripheral angiopathy without gangrene: Secondary | ICD-10-CM

## 2023-02-18 DIAGNOSIS — M79674 Pain in right toe(s): Secondary | ICD-10-CM | POA: Diagnosis not present

## 2023-02-18 DIAGNOSIS — M79675 Pain in left toe(s): Secondary | ICD-10-CM

## 2023-02-18 DIAGNOSIS — B351 Tinea unguium: Secondary | ICD-10-CM | POA: Diagnosis not present

## 2023-02-18 NOTE — Progress Notes (Signed)
This patient presents to the office with chief complaint of long thick painful nails.  Patient says the nails are painful walking and wearing shoes.  This patient is unable to self treat.  This patient is unable to trim his nails since he is unable to reach her nails.  he presents to the office for preventative foot care services.    General Appearance  Alert, conversant and in no acute stress.  Vascular  Dorsalis pedis and posterior tibial  pulses are palpable  bilaterally.  Capillary return is within normal limits  bilaterally. Temperature is within normal limits  bilaterally.  Neurologic  Senn-Weinstein monofilament wire test within normal limits  bilaterally. Muscle power within normal limits bilaterally.  Nails Thick disfigured discolored nails with subungual debris  from hallux to fifth toes bilaterally. No evidence of bacterial infection or drainage bilaterally.   Orthopedic  No limitations of motion  feet .  No crepitus or effusions noted.  No bony pathology .  Syndactaly 2-3 left foot.  Skin  normotropic skin with no porokeratosis noted bilaterally.  No signs of infections or ulcers noted.     Onychomycosis  Nails  B/L.  Pain in right toes  Pain in left toes  Debridement of nails both feet followed trimming the nails with dremel tool.      RTC  3  months.   Juanpablo Ciresi DPM  

## 2023-02-22 DIAGNOSIS — N183 Chronic kidney disease, stage 3 unspecified: Secondary | ICD-10-CM | POA: Diagnosis not present

## 2023-02-22 DIAGNOSIS — I129 Hypertensive chronic kidney disease with stage 1 through stage 4 chronic kidney disease, or unspecified chronic kidney disease: Secondary | ICD-10-CM | POA: Diagnosis not present

## 2023-02-22 DIAGNOSIS — C9110 Chronic lymphocytic leukemia of B-cell type not having achieved remission: Secondary | ICD-10-CM | POA: Diagnosis not present

## 2023-02-22 DIAGNOSIS — E1122 Type 2 diabetes mellitus with diabetic chronic kidney disease: Secondary | ICD-10-CM | POA: Diagnosis not present

## 2023-03-19 ENCOUNTER — Other Ambulatory Visit: Payer: Self-pay | Admitting: Hematology and Oncology

## 2023-03-19 ENCOUNTER — Inpatient Hospital Stay: Payer: Medicare PPO | Admitting: Hematology and Oncology

## 2023-03-19 ENCOUNTER — Inpatient Hospital Stay: Payer: Medicare PPO | Attending: Hematology and Oncology

## 2023-03-19 VITALS — BP 158/78 | HR 66 | Temp 97.4°F | Resp 13 | Wt 224.4 lb

## 2023-03-19 DIAGNOSIS — D696 Thrombocytopenia, unspecified: Secondary | ICD-10-CM | POA: Diagnosis not present

## 2023-03-19 DIAGNOSIS — C9110 Chronic lymphocytic leukemia of B-cell type not having achieved remission: Secondary | ICD-10-CM

## 2023-03-19 LAB — CBC WITH DIFFERENTIAL (CANCER CENTER ONLY)
Abs Immature Granulocytes: 0.26 10*3/uL — ABNORMAL HIGH (ref 0.00–0.07)
Basophils Absolute: 0 10*3/uL (ref 0.0–0.1)
Basophils Relative: 0 %
Eosinophils Absolute: 0.2 10*3/uL (ref 0.0–0.5)
Eosinophils Relative: 0 %
HCT: 35 % — ABNORMAL LOW (ref 39.0–52.0)
Hemoglobin: 10.4 g/dL — ABNORMAL LOW (ref 13.0–17.0)
Immature Granulocytes: 0 %
Lymphocytes Relative: 93 %
Lymphs Abs: 88 10*3/uL — ABNORMAL HIGH (ref 0.7–4.0)
MCH: 26.7 pg (ref 26.0–34.0)
MCHC: 29.7 g/dL — ABNORMAL LOW (ref 30.0–36.0)
MCV: 89.7 fL (ref 80.0–100.0)
Monocytes Absolute: 3 10*3/uL — ABNORMAL HIGH (ref 0.1–1.0)
Monocytes Relative: 3 %
Neutro Abs: 3.6 10*3/uL (ref 1.7–7.7)
Neutrophils Relative %: 4 %
Platelet Count: 113 10*3/uL — ABNORMAL LOW (ref 150–400)
RBC: 3.9 MIL/uL — ABNORMAL LOW (ref 4.22–5.81)
RDW: 15.9 % — ABNORMAL HIGH (ref 11.5–15.5)
Smear Review: NORMAL
WBC Count: 95.1 10*3/uL (ref 4.0–10.5)
nRBC: 0.1 % (ref 0.0–0.2)

## 2023-03-19 LAB — CMP (CANCER CENTER ONLY)
ALT: 17 U/L (ref 0–44)
AST: 23 U/L (ref 15–41)
Albumin: 4.5 g/dL (ref 3.5–5.0)
Alkaline Phosphatase: 72 U/L (ref 38–126)
Anion gap: 4 — ABNORMAL LOW (ref 5–15)
BUN: 27 mg/dL — ABNORMAL HIGH (ref 8–23)
CO2: 30 mmol/L (ref 22–32)
Calcium: 9.7 mg/dL (ref 8.9–10.3)
Chloride: 108 mmol/L (ref 98–111)
Creatinine: 1.31 mg/dL — ABNORMAL HIGH (ref 0.61–1.24)
GFR, Estimated: 59 mL/min — ABNORMAL LOW (ref >60)
Glucose, Bld: 108 mg/dL — ABNORMAL HIGH (ref 70–99)
Potassium: 4.7 mmol/L (ref 3.5–5.1)
Sodium: 142 mmol/L (ref 135–145)
Total Bilirubin: 0.8 mg/dL (ref 0.3–1.2)
Total Protein: 7 g/dL (ref 6.5–8.1)

## 2023-03-19 LAB — LACTATE DEHYDROGENASE: LDH: 164 U/L (ref 98–192)

## 2023-03-19 NOTE — Progress Notes (Signed)
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   (2) on maintenance rituximab, first dose 03/07/2019, repeated every 3 months             (a) baseline PET scan obtained on 02/16/2020 was Deauville 2 except for the left iliac nodes, with SUV max 3.1             (b) rituximab decreased to every 6 months beginning October 2021    Interval History:  Cody [name] 67 y.o. male with medical history significant for CLL previously on monotherapy rituximab who presents for a follow up visit. The patient's last visit was on 12/17/2022. In the interim since the last visit he has not had any major changes in his health.   On exam today [name] reports he has been well overall in the interim since her last visit.  He had no infectious symptoms such as runny nose, sore throat, or cough.  His weight has increased to 224 pounds up from 119 pounds at the beginning of the year.  He reports that his appetite is good and he is eating quite well.  He notes he is not having any bumps or lumps concerning for lymphadenopathy.  His energy levels are strong and he is not having any shortness of breath, lightheadedness, or dizziness.  He also denies any bleeding, bruising, or dark stools.  Overall he is at his baseline level of health at this time.  He denies fevers, chills, sweats, shortness of breath, chest pain or cough. He has no other complaints. A full 10 point ROS is listed below.  Today we discussed the results of his blood  counts and the possibility of starting p.o. therapy for his CLL.  Currently he does not meet guideline criteria but is close with a hemoglobin of 10.4 and platelets of 133.  The patient and his sister noted they would like to continue monitoring at this time.  MEDICAL HISTORY:  Past Medical History:  Diagnosis Date   Alcoholic cirrhosis (HCC)    Cataract    Diabetes mellitus without complication (HCC)    High blood pressure 10/24/2004   History of alcohol abuse    Hypercholesterolemia     SURGICAL HISTORY: No past surgical history on file.  SOCIAL HISTORY: Social History   Socioeconomic History   Marital status: Divorced    Spouse name: Not on file   Number of children: Not on file   Years of education: Not on file   Highest education level: Not on file  Occupational History   Not on file  Tobacco Use   Smoking status: Never   Smokeless tobacco: Never  Substance and Sexual Activity   Alcohol use: Not Currently   Drug use: No   Sexual activity: Not on file  Other Topics Concern   Not on file  Social History Narrative   Not on file   Social Determinants of Health   Financial  Resource Strain: Not on file  Food Insecurity: No Food Insecurity (12/05/2022)   Hunger Vital Sign    Worried About Running Out of Food in the Last Year: Never true    Ran Out of Food in the Last Year: Never true  Transportation Needs: No Transportation Needs (12/05/2022)   PRAPARE - Administrator, Civil Service (Medical): No    Lack of Transportation (Non-Medical): No  Physical Activity: Not on file  Stress: Not on file  Social Connections: Not on file  Intimate Partner Violence: Not At Risk (12/05/2022)   Humiliation, Afraid, Rape, and Kick questionnaire    Fear of Current or Ex-Partner: No    Emotionally Abused: No    Physically Abused: No    Sexually Abused: No    FAMILY HISTORY: Family History  Problem Relation Age of Onset   Alcohol abuse Father    Cirrhosis Father     Coronary artery disease Father     ALLERGIES:  is allergic to rituxan [rituximab].  MEDICATIONS:  Current Outpatient Medications  Medication Sig Dispense Refill   acetaminophen (TYLENOL) 325 MG tablet Take 2 tablets (650 mg total) by mouth every 6 (six) hours as needed for mild pain (or Fever >/= 101).     allopurinol (ZYLOPRIM) 300 MG tablet TAKE 1 TABLET DAILY (Patient taking differently: Take 300 mg by mouth in the morning.) 90 tablet 3   amLODipine (NORVASC) 10 MG tablet Take 10 mg by mouth at bedtime.     atorvastatin (LIPITOR) 10 MG tablet Take 10 mg by mouth at bedtime.     B-D UF III MINI PEN NEEDLES 31G X 5 MM MISC      Blood Glucose Monitoring Suppl (ACCU-CHEK GUIDE) w/Device KIT      fexofenadine (ALLEGRA) 180 MG tablet Take 180 mg by mouth at bedtime.     guaiFENesin-codeine 100-10 MG/5ML syrup Take 5-10 mLs by mouth every 4 (four) hours as needed for cough. 120 mL 0   linagliptin (TRADJENTA) 5 MG TABS tablet Take 5 mg by mouth in the morning.     metFORMIN (GLUCOPHAGE-XR) 500 MG 24 hr tablet Take 1 tablet (500 mg total) by mouth 2 (two) times daily with a meal. Take 500 mg by mouth in the morning and 1,000 mg at bedtime     NOREL AD 4-10-325 MG TABS Take 1 tablet by mouth 3 (three) times daily.     ondansetron (ZOFRAN) 4 MG tablet TAKE 1 TABLET BY MOUTH EVERY 8 HOURS AS NEEDED FOR NAUSEA AND VOMITING 8 tablet 2   ONETOUCH DELICA LANCETS FINE MISC 3 (three) times daily.   5   TOUJEO SOLOSTAR 300 UNIT/ML SOPN Inject 5-30 Units into the skin See admin instructions. Inject 5-15 units into the skin in the morning and 30 units at bedtime, PER SLIDING SCALE (FOR BOTH)     No current facility-administered medications for this visit.    REVIEW OF SYSTEMS:   Constitutional: ( - ) fevers, ( - )  chills , ( - ) night sweats Eyes: ( - ) blurriness of vision, ( - ) double vision, ( - ) watery eyes Ears, nose, mouth, throat, and face: ( - ) mucositis, ( - ) sore throat Respiratory: ( - )  cough, ( - ) dyspnea, ( - ) wheezes Cardiovascular: ( - ) palpitation, ( - ) chest discomfort, ( - ) lower extremity swelling Gastrointestinal:  ( - ) nausea, ( - ) heartburn, ( - ) change in  bowel habits Skin: ( - ) abnormal skin rashes Lymphatics: ( - ) new lymphadenopathy, ( - ) easy bruising Neurological: ( - ) numbness, ( - ) tingling, ( - ) new weaknesses Behavioral/Psych: ( - ) mood change, ( - ) new changes  All other systems were reviewed with the patient and are negative.  PHYSICAL EXAMINATION: ECOG PERFORMANCE STATUS: 1 - Symptomatic but completely ambulatory  There were no vitals filed for this visit. There were no vitals filed for this visit.   GENERAL: Well-appearing elderly male, alert, no distress and comfortable SKIN: skin color, texture, turgor are normal, no rashes or significant lesions EYES: conjunctiva are pink and non-injected, sclera clear NECK: supple, non-tender LYMPH:  no palpable lymphadenopathy in the cervical or supraclavicular regions.  LUNGS: clear to auscultation and percussion with normal breathing effort HEART: regular rate & rhythm and no murmurs and no lower extremity edema Musculoskeletal: no cyanosis of digits and no clubbing  PSYCH: alert & oriented x 3, fluent speech NEURO: no focal motor/sensory deficits  LABORATORY DATA:  I have reviewed the data as listed    Latest Ref Rng & Units 12/17/2022    2:27 PM 12/12/2022    4:41 AM 12/11/2022    4:36 AM  CBC  WBC 4.0 - 10.5 K/uL 146.2  134.8  132.8   Hemoglobin 13.0 - 17.0 g/dL 16.1  09.6  04.5   Hematocrit 39.0 - 52.0 % 40.7  36.9  38.3   Platelets 150 - 400 K/uL 221  225  212        Latest Ref Rng & Units 12/17/2022    2:27 PM 12/12/2022    4:41 AM 12/11/2022    4:36 AM  CMP  Glucose 70 - 99 mg/dL 83  98  409   BUN 8 - 23 mg/dL 26  34  26   Creatinine 0.61 - 1.24 mg/dL 8.11  9.14  7.82   Sodium 135 - 145 mmol/L 144  143  139   Potassium 3.5 - 5.1 mmol/L 5.0  4.8  5.3   Chloride 98 -  111 mmol/L 109  109  108   CO2 22 - 32 mmol/L 26  24  22    Calcium 8.9 - 10.3 mg/dL 9.0  8.5  8.2   Total Protein 6.5 - 8.1 g/dL 7.9  6.9  7.1   Total Bilirubin 0.3 - 1.2 mg/dL 0.5  0.5  0.6   Alkaline Phos 38 - 126 U/L 90  71  71   AST 15 - 41 U/L 30  33  40   ALT 0 - 44 U/L 42  45  48     RADIOGRAPHIC STUDIES: No results found.  ASSESSMENT & PLAN Cody [name] is a 67 y.o. male with medical history significant for CLL who presents for a follow up visit.   # CLL Stage I  --patient underwent approximately 2 year of monotherapy rituximab followed by maintenance rituximab. Discontinued due to poor tolerance with medication.  --Currently on surveillance.  --Labs show white blood cell 95 0.1, hemoglobin 10.4, MCV 89.7, and platelets of 113 --In the event he was found to have progression of his CLL would recommend treatment with ibrutinib or acalabrutinib --Patient's labs today were borderline for consideration of starting treatment.  He notes he would like to continue with observation at this time and wait to see if they fluctuate back up as they have done in the past. --Return to clinic in 3 months time for  further evaluation.  Interval 6-week labs.  No orders of the defined types were placed in this encounter.   All questions were answered. The patient knows to call the clinic with any problems, questions or concerns.  I have spent a total of 25 minutes minutes of face-to-face and non-face-to-face time, preparing to see the patient, performing a medically appropriate examination, counseling and educating the patient, documenting clinical information in the electronic health record,and care coordination.   Ulysees Barns, MD Department of Hematology/Oncology [facility] at [facility] Phone: [phone] Pager: [phone] Email: [email or web address]    03/19/2023 7:23 AM  Mathis Fare BD, Catovsky D, Caligaris-Cappio F, Dighiero G,  Dhner H, [place], [place], [place] E, Chiorazzi N, [place], Rai KR, [place], Eichhorst B, O'Brien S, Robak T, Seymour JF, Kipps TJ. iwCLL guidelines for diagnosis, indications for treatment, response assessment, and supportive management of CLL. Blood. 2018 Jun 21;131(25):2745-2760.  Active disease should be clearly documented to initiate therapy. At least 1 of the following criteria should be met.  1) Evidence of progressive marrow failure as manifested by the development of, or worsening of, anemia and/or thrombocytopenia. Cutoff levels of Hb <10 g/dL or platelet counts <130  109/L are generally regarded as indication for treatment. However, in some patients, platelet counts <100  109/L may remain stable over a long period; this situation does not automatically require therapeutic intervention. 2) Massive (ie, ?6 cm below the left costal margin) or progressive or symptomatic splenomegaly. 3) Massive nodes (ie, ?10 cm in longest diameter) or progressive or symptomatic lymphadenopathy. 4) Progressive lymphocytosis with an increase of ?50% over a 20-month period, or lymphocyte doubling time (LDT) <6 months. LDT can be obtained by linear regression extrapolation of absolute lymphocyte counts obtained at intervals of 2 weeks over an observation period of 2 to 3 months; patients with initial blood lymphocyte counts <30  109/L may require a longer observation period to determine the LDT. Factors contributing to lymphocytosis other than CLL (eg, infections, steroid administration) should be excluded. 5) Autoimmune complications including anemia or thrombocytopenia poorly responsive to corticosteroids. 6) Symptomatic or functional extranodal involvement (eg, skin, kidney, lung, spine). Disease-related symptoms as defined by any of the following: Unintentional weight loss ?10% within the previous 6 months. Significant fatigue (ie, ECOG performance scale 2 or worse; cannot work or unable to  perform usual activities). Fevers ?100.78F or 38.0C for 2 or more weeks without evidence of infection. Night sweats for ?1 month without evidence of infection.

## 2023-04-07 DIAGNOSIS — I10 Essential (primary) hypertension: Secondary | ICD-10-CM | POA: Diagnosis not present

## 2023-04-07 DIAGNOSIS — E1165 Type 2 diabetes mellitus with hyperglycemia: Secondary | ICD-10-CM | POA: Diagnosis not present

## 2023-04-07 DIAGNOSIS — N183 Chronic kidney disease, stage 3 unspecified: Secondary | ICD-10-CM | POA: Diagnosis not present

## 2023-04-14 DIAGNOSIS — E1169 Type 2 diabetes mellitus with other specified complication: Secondary | ICD-10-CM | POA: Diagnosis not present

## 2023-04-24 DIAGNOSIS — E1169 Type 2 diabetes mellitus with other specified complication: Secondary | ICD-10-CM | POA: Diagnosis not present

## 2023-04-24 DIAGNOSIS — I10 Essential (primary) hypertension: Secondary | ICD-10-CM | POA: Diagnosis not present

## 2023-04-30 ENCOUNTER — Other Ambulatory Visit: Payer: Self-pay

## 2023-04-30 ENCOUNTER — Inpatient Hospital Stay: Payer: Medicare PPO | Attending: Hematology and Oncology

## 2023-04-30 ENCOUNTER — Telehealth: Payer: Self-pay | Admitting: *Deleted

## 2023-04-30 DIAGNOSIS — C9110 Chronic lymphocytic leukemia of B-cell type not having achieved remission: Secondary | ICD-10-CM | POA: Insufficient documentation

## 2023-04-30 LAB — CBC WITH DIFFERENTIAL (CANCER CENTER ONLY)
Abs Immature Granulocytes: 0.35 10*3/uL — ABNORMAL HIGH (ref 0.00–0.07)
Basophils Absolute: 0 10*3/uL (ref 0.0–0.1)
Basophils Relative: 0 %
Eosinophils Absolute: 0.3 10*3/uL (ref 0.0–0.5)
Eosinophils Relative: 0 %
HCT: 36.7 % — ABNORMAL LOW (ref 39.0–52.0)
Hemoglobin: 10.9 g/dL — ABNORMAL LOW (ref 13.0–17.0)
Immature Granulocytes: 0 %
Lymphocytes Relative: 94 %
Lymphs Abs: 119 10*3/uL — ABNORMAL HIGH (ref 0.7–4.0)
MCH: 26.7 pg (ref 26.0–34.0)
MCHC: 29.7 g/dL — ABNORMAL LOW (ref 30.0–36.0)
MCV: 90 fL (ref 80.0–100.0)
Monocytes Absolute: 3.3 10*3/uL — ABNORMAL HIGH (ref 0.1–1.0)
Monocytes Relative: 3 %
Neutro Abs: 4 10*3/uL (ref 1.7–7.7)
Neutrophils Relative %: 3 %
Platelet Count: 109 10*3/uL — ABNORMAL LOW (ref 150–400)
RBC: 4.08 MIL/uL — ABNORMAL LOW (ref 4.22–5.81)
RDW: 14.9 % (ref 11.5–15.5)
Smear Review: NORMAL
WBC Count: 127 10*3/uL (ref 4.0–10.5)
nRBC: 0 % (ref 0.0–0.2)

## 2023-04-30 LAB — CMP (CANCER CENTER ONLY)
ALT: 23 U/L (ref 0–44)
AST: 29 U/L (ref 15–41)
Albumin: 4.6 g/dL (ref 3.5–5.0)
Alkaline Phosphatase: 86 U/L (ref 38–126)
Anion gap: 6 (ref 5–15)
BUN: 26 mg/dL — ABNORMAL HIGH (ref 8–23)
CO2: 29 mmol/L (ref 22–32)
Calcium: 9.3 mg/dL (ref 8.9–10.3)
Chloride: 107 mmol/L (ref 98–111)
Creatinine: 1.36 mg/dL — ABNORMAL HIGH (ref 0.61–1.24)
GFR, Estimated: 57 mL/min — ABNORMAL LOW (ref >60)
Glucose, Bld: 112 mg/dL — ABNORMAL HIGH (ref 70–99)
Potassium: 4.5 mmol/L (ref 3.5–5.1)
Sodium: 142 mmol/L (ref 135–145)
Total Bilirubin: 0.9 mg/dL (ref 0.3–1.2)
Total Protein: 7.4 g/dL (ref 6.5–8.1)

## 2023-04-30 LAB — LACTATE DEHYDROGENASE: LDH: 186 U/L (ref 98–192)

## 2023-04-30 NOTE — Telephone Encounter (Signed)
CRITICAL VALUE STICKER  CRITICAL VALUE:  WBC 127 K  RECEIVER (on-site recipient of call): Binnie Rail, RN  DATE & TIME NOTIFIED: 04/30/23 9:33 am  MESSENGER (representative from lab): Shanda Bumps  MD NOTIFIED:  Dr. Leonides Schanz  TIME OF NOTIFICATION: 10 am  RESPONSE:  MD acknowledged receipt of lab result

## 2023-05-01 ENCOUNTER — Telehealth: Payer: Self-pay | Admitting: *Deleted

## 2023-05-01 NOTE — Telephone Encounter (Signed)
TCT to patient. Spoke with his sister who takes care of him. Advised  that his labs remain borderline for requiring treatment. Technically his CLL does not require treatment right now, but Dr. Leonides Schanz suspects we may need to start treatment in the near future. If he would like to continue monitoring we do have a visit scheduled for him in July 2024.  Sister is agreeable to the visit in July. Proved date and time for that appt. She voiced understanding.

## 2023-05-21 ENCOUNTER — Ambulatory Visit (INDEPENDENT_AMBULATORY_CARE_PROVIDER_SITE_OTHER): Payer: Medicare PPO | Admitting: Podiatry

## 2023-05-21 ENCOUNTER — Encounter: Payer: Self-pay | Admitting: Podiatry

## 2023-05-21 DIAGNOSIS — M79675 Pain in left toe(s): Secondary | ICD-10-CM

## 2023-05-21 DIAGNOSIS — M79674 Pain in right toe(s): Secondary | ICD-10-CM | POA: Diagnosis not present

## 2023-05-21 DIAGNOSIS — E1151 Type 2 diabetes mellitus with diabetic peripheral angiopathy without gangrene: Secondary | ICD-10-CM

## 2023-05-21 DIAGNOSIS — B351 Tinea unguium: Secondary | ICD-10-CM

## 2023-05-21 NOTE — Progress Notes (Signed)
This patient presents to the office with chief complaint of long thick painful nails.  Patient says the nails are painful walking and wearing shoes.  This patient is unable to self treat.  This patient is unable to trim his nails since he is unable to reach her nails.  he presents to the office for preventative foot care services.    General Appearance  Alert, conversant and in no acute stress.  Vascular  Dorsalis pedis and posterior tibial  pulses are palpable  bilaterally.  Capillary return is within normal limits  bilaterally. Temperature is within normal limits  bilaterally.  Neurologic  Senn-Weinstein monofilament wire test within normal limits  bilaterally. Muscle power within normal limits bilaterally.  Nails Thick disfigured discolored nails with subungual debris  from hallux to fifth toes bilaterally. No evidence of bacterial infection or drainage bilaterally.   Orthopedic  No limitations of motion  feet .  No crepitus or effusions noted.  No bony pathology .  Syndactaly 2-3 left foot.  Skin  normotropic skin with no porokeratosis noted bilaterally.  No signs of infections or ulcers noted.     Onychomycosis  Nails  B/L.  Pain in right toes  Pain in left toes  Debridement of nails both feet followed trimming the nails with dremel tool.      RTC  3  months.   Anaia Frith DPM  

## 2023-05-24 DIAGNOSIS — E1169 Type 2 diabetes mellitus with other specified complication: Secondary | ICD-10-CM | POA: Diagnosis not present

## 2023-05-24 DIAGNOSIS — I10 Essential (primary) hypertension: Secondary | ICD-10-CM | POA: Diagnosis not present

## 2023-05-27 DIAGNOSIS — E1169 Type 2 diabetes mellitus with other specified complication: Secondary | ICD-10-CM | POA: Diagnosis not present

## 2023-05-27 DIAGNOSIS — N183 Chronic kidney disease, stage 3 unspecified: Secondary | ICD-10-CM | POA: Diagnosis not present

## 2023-05-27 DIAGNOSIS — E785 Hyperlipidemia, unspecified: Secondary | ICD-10-CM | POA: Diagnosis not present

## 2023-05-27 DIAGNOSIS — Z125 Encounter for screening for malignant neoplasm of prostate: Secondary | ICD-10-CM | POA: Diagnosis not present

## 2023-05-27 DIAGNOSIS — I10 Essential (primary) hypertension: Secondary | ICD-10-CM | POA: Diagnosis not present

## 2023-06-10 ENCOUNTER — Telehealth: Payer: Self-pay | Admitting: Pharmacist

## 2023-06-10 ENCOUNTER — Other Ambulatory Visit (HOSPITAL_COMMUNITY): Payer: Self-pay

## 2023-06-10 ENCOUNTER — Other Ambulatory Visit: Payer: Self-pay

## 2023-06-10 ENCOUNTER — Telehealth: Payer: Self-pay | Admitting: Pharmacy Technician

## 2023-06-10 ENCOUNTER — Inpatient Hospital Stay: Payer: Medicare PPO | Admitting: Hematology and Oncology

## 2023-06-10 ENCOUNTER — Inpatient Hospital Stay: Payer: Medicare PPO | Attending: Hematology and Oncology

## 2023-06-10 VITALS — BP 138/73 | HR 64 | Temp 97.0°F | Resp 13 | Wt 208.8 lb

## 2023-06-10 DIAGNOSIS — D696 Thrombocytopenia, unspecified: Secondary | ICD-10-CM | POA: Diagnosis not present

## 2023-06-10 DIAGNOSIS — Z79899 Other long term (current) drug therapy: Secondary | ICD-10-CM | POA: Insufficient documentation

## 2023-06-10 DIAGNOSIS — C9110 Chronic lymphocytic leukemia of B-cell type not having achieved remission: Secondary | ICD-10-CM | POA: Diagnosis not present

## 2023-06-10 DIAGNOSIS — E139 Other specified diabetes mellitus without complications: Secondary | ICD-10-CM

## 2023-06-10 LAB — CBC WITH DIFFERENTIAL (CANCER CENTER ONLY)
Abs Immature Granulocytes: 0.43 10*3/uL — ABNORMAL HIGH (ref 0.00–0.07)
Basophils Absolute: 0.1 10*3/uL (ref 0.0–0.1)
Basophils Relative: 0 %
Eosinophils Absolute: 0.2 10*3/uL (ref 0.0–0.5)
Eosinophils Relative: 0 %
HCT: 36.7 % — ABNORMAL LOW (ref 39.0–52.0)
Hemoglobin: 10.6 g/dL — ABNORMAL LOW (ref 13.0–17.0)
Immature Granulocytes: 0 %
Lymphocytes Relative: 95 %
Lymphs Abs: 157.3 10*3/uL — ABNORMAL HIGH (ref 0.7–4.0)
MCH: 26.2 pg (ref 26.0–34.0)
MCHC: 28.9 g/dL — ABNORMAL LOW (ref 30.0–36.0)
MCV: 90.6 fL (ref 80.0–100.0)
Monocytes Absolute: 4.3 10*3/uL — ABNORMAL HIGH (ref 0.1–1.0)
Monocytes Relative: 3 %
Neutro Abs: 3.6 10*3/uL (ref 1.7–7.7)
Neutrophils Relative %: 2 %
Platelet Count: 104 10*3/uL — ABNORMAL LOW (ref 150–400)
RBC: 4.05 MIL/uL — ABNORMAL LOW (ref 4.22–5.81)
RDW: 15.9 % — ABNORMAL HIGH (ref 11.5–15.5)
Smear Review: NORMAL
WBC Count: 166 10*3/uL (ref 4.0–10.5)
nRBC: 0.1 % (ref 0.0–0.2)

## 2023-06-10 LAB — CMP (CANCER CENTER ONLY)
ALT: 22 U/L (ref 0–44)
AST: 29 U/L (ref 15–41)
Albumin: 4.6 g/dL (ref 3.5–5.0)
Alkaline Phosphatase: 83 U/L (ref 38–126)
Anion gap: 6 (ref 5–15)
BUN: 30 mg/dL — ABNORMAL HIGH (ref 8–23)
CO2: 27 mmol/L (ref 22–32)
Calcium: 9.5 mg/dL (ref 8.9–10.3)
Chloride: 108 mmol/L (ref 98–111)
Creatinine: 1.44 mg/dL — ABNORMAL HIGH (ref 0.61–1.24)
GFR, Estimated: 53 mL/min — ABNORMAL LOW (ref >60)
Glucose, Bld: 85 mg/dL (ref 70–99)
Potassium: 4.8 mmol/L (ref 3.5–5.1)
Sodium: 141 mmol/L (ref 135–145)
Total Bilirubin: 1 mg/dL (ref 0.3–1.2)
Total Protein: 7.3 g/dL (ref 6.5–8.1)

## 2023-06-10 LAB — LACTATE DEHYDROGENASE: LDH: 193 U/L — ABNORMAL HIGH (ref 98–192)

## 2023-06-10 MED ORDER — ZANUBRUTINIB 80 MG PO CAPS
160.0000 mg | ORAL_CAPSULE | Freq: Two times a day (BID) | ORAL | 2 refills | Status: DC
  Filled 2023-06-10 (×2): qty 120, 30d supply, fill #0
  Filled 2023-07-03 – 2023-07-08 (×2): qty 120, 30d supply, fill #1
  Filled 2023-08-03: qty 120, 30d supply, fill #2

## 2023-06-10 NOTE — Telephone Encounter (Signed)
Oral Oncology Patient Advocate Encounter  Prior Authorization for Brukinsa has been approved.    PA# [id number] Effective dates: 06/10/23 through 11/24/23  Patients co-pay is $1,995.18.    Jinger Neighbors, CPhT-Adv Oncology Pharmacy Patient Advocate [facility] Direct Number: [phone]  Fax: [phone]

## 2023-06-10 NOTE — Telephone Encounter (Signed)
Oral Oncology Patient Advocate Encounter  Was successful in securing patient a $8,000 grant from [employer] to provide copayment coverage for Brukinsa.  This will keep the out of pocket expense at $0.     [employer] ID: [id number]  I have spoken with the patient.   The billing information is as follows and has been shared with [facility].    RxBin: [id number] PCN: [id number] Member ID: [id number] Group ID: [id number] Dates of Eligibility: 05/11/23 through 05/09/24  Fund:  CLL  Jinger Neighbors, CPhT-Adv Oncology Pharmacy Patient Advocate [facility] Direct Number: [phone]  Fax: [phone]

## 2023-06-10 NOTE — Telephone Encounter (Signed)
Oral Chemotherapy Pharmacist Encounter  I spoke with patient's sister, Rollyn Scialdone, for overview of: Brukinsa (zanubrutinib) for the treatment of CLL, planned duration until disease progression or unacceptable toxicity.   Counseled patient's sister on administration, dosing, side effects, monitoring, drug-food interactions, safe handling, storage, and disposal.  Patient will take Brukinsa 80mg  capsules, 2 capsules (160mg ) by mouth every 12 hours.  Patient's sister knows that patient needs to avoid grapefruit or grapefruit juice while on therapy with Brukinsa.  Brukinsa start date: 06/17/23  Adverse effects include but are not limited to: bruising, decreased blood counts, rash, diarrhea, fatigue, musculoskeletal pain/arthralgias, peripheral edema.  Diarrhea: Patient's sister will obtain anti diarrheal and alert the office if patient has 4 or more loose stools above baseline.  Reviewed with patient's sister importance of keeping a medication schedule and plan for any missed doses. No barriers to medication adherence identified.  Medication reconciliation performed and medication/allergy list updated.  All questions answered.  Sheilah Mins voiced understanding and appreciation.   Medication education handout placed in mail for patient and patient's sister. Patient's sister knows to call the office with questions or concerns. Oral Chemotherapy Clinic phone number provided.  Lenord Carbo, PharmD, BCPS, BCOP Hematology/Oncology Clinical Pharmacist [facility] Oral Chemotherapy Navigation Clinics [phone] 06/10/2023 1:14 PM

## 2023-06-10 NOTE — Progress Notes (Signed)
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   (2) on maintenance rituximab, first dose 03/07/2019, repeated every 3 months             (a) baseline PET scan obtained on 02/16/2020 was Deauville 2 except for the left iliac nodes, with SUV max 3.1             (b) rituximab decreased to every 6 months beginning October 2021    Interval History:  Cody [name] 67 y.o. male with medical history significant for CLL previously on monotherapy rituximab who presents for a follow up visit. The patient's last visit was on 03/19/2023. In the interim since the last visit he has not had any major changes in his health.   On exam today [name] reports he has been well overall in the interim since her last visit.  He reports he had a "relaxing Fourth of July".  He notes overall he is been pretty good.  His energy is stable and his appetite is good.  He is not having any lightheadedness, dizziness, shortness of breath.  He is not having any infectious symptoms such as fevers, chills, sweats.  He denies any runny nose or sore throat.  He denies any bumps or lumps concerning for lymphadenopathy.  Overall he is at his baseline level of health.  He denies fevers, chills, sweats, shortness of breath, chest pain or cough. He has no other complaints. A full 10 point ROS is listed below.  Previously we discussed the results of his blood counts and the possibility of starting p.o. therapy for his CLL.  Currently he does not meet  guideline criteria but is close with a hemoglobin and platelet marginally above guideline recommendations.  The patient and his sister noted they would like to proceed with PO treatment.  We discussed the risks and benefits of this treatment.  MEDICAL HISTORY:  Past Medical History:  Diagnosis Date   Alcoholic cirrhosis (HCC)    Cataract    Diabetes mellitus without complication (HCC)    High blood pressure 10/24/2004   History of alcohol abuse    Hypercholesterolemia     SURGICAL HISTORY: No past surgical history on file.  SOCIAL HISTORY: Social History   Socioeconomic History   Marital status: Divorced    Spouse name: Not on file   Number of children: Not on file   Years of education: Not on file   Highest education level: Not on file  Occupational History   Not on file  Tobacco Use   Smoking status: Never   Smokeless tobacco: Never  Substance and Sexual Activity   Alcohol use: Not Currently   Drug use: No   Sexual activity: Not on file  Other Topics Concern   Not on file  Social History Narrative   Not on file   Social Determinants of Health   Financial Resource Strain: Not on file  Food Insecurity: No Food  Insecurity (12/05/2022)   Hunger Vital Sign    Worried About Running Out of Food in the Last Year: Never true    Ran Out of Food in the Last Year: Never true  Transportation Needs: No Transportation Needs (12/05/2022)   PRAPARE - Administrator, Civil Service (Medical): No    Lack of Transportation (Non-Medical): No  Physical Activity: Not on file  Stress: Not on file  Social Connections: Not on file  Intimate Partner Violence: Not At Risk (12/05/2022)   Humiliation, Afraid, Rape, and Kick questionnaire    Fear of Current or Ex-Partner: No    Emotionally Abused: No    Physically Abused: No    Sexually Abused: No    FAMILY HISTORY: Family History  Problem Relation Age of Onset   Alcohol abuse Father    Cirrhosis Father    Coronary artery  disease Father     ALLERGIES:  is allergic to rituxan [rituximab].  MEDICATIONS:  Current Outpatient Medications  Medication Sig Dispense Refill   zanubrutinib (BRUKINSA) 80 MG capsule Take 2 capsules (160 mg total) by mouth 2 (two) times daily. 120 capsule 2   allopurinol (ZYLOPRIM) 300 MG tablet TAKE 1 TABLET DAILY (Patient taking differently: Take 300 mg by mouth in the morning.) 90 tablet 3   amLODipine (NORVASC) 10 MG tablet Take 10 mg by mouth at bedtime.     atorvastatin (LIPITOR) 10 MG tablet Take 10 mg by mouth at bedtime.     B-D UF III MINI PEN NEEDLES 31G X 5 MM MISC      Blood Glucose Monitoring Suppl (ACCU-CHEK GUIDE) w/Device KIT      fexofenadine (ALLEGRA) 180 MG tablet Take 180 mg by mouth at bedtime.     linagliptin (TRADJENTA) 5 MG TABS tablet Take 5 mg by mouth in the morning.     metFORMIN (GLUCOPHAGE-XR) 500 MG 24 hr tablet Take 1 tablet (500 mg total) by mouth 2 (two) times daily with a meal. Take 500 mg by mouth in the morning and 1,000 mg at bedtime     ondansetron (ZOFRAN) 4 MG tablet TAKE 1 TABLET BY MOUTH EVERY 8 HOURS AS NEEDED FOR NAUSEA AND VOMITING (Patient not taking: Reported on 03/19/2023) 8 tablet 2   ONETOUCH DELICA LANCETS FINE MISC 3 (three) times daily.   5   TOUJEO SOLOSTAR 300 UNIT/ML SOPN Inject 5-30 Units into the skin See admin instructions. Inject 5-15 units into the skin in the morning and 30 units at bedtime, PER SLIDING SCALE (FOR BOTH)     No current facility-administered medications for this visit.    REVIEW OF SYSTEMS:   Constitutional: ( - ) fevers, ( - )  chills , ( - ) night sweats Eyes: ( - ) blurriness of vision, ( - ) double vision, ( - ) watery eyes Ears, nose, mouth, throat, and face: ( - ) mucositis, ( - ) sore throat Respiratory: ( - ) cough, ( - ) dyspnea, ( - ) wheezes Cardiovascular: ( - ) palpitation, ( - ) chest discomfort, ( - ) lower extremity swelling Gastrointestinal:  ( - ) nausea, ( - ) heartburn, ( - ) change in  bowel habits Skin: ( - ) abnormal skin rashes Lymphatics: ( - ) new lymphadenopathy, ( - ) easy bruising Neurological: ( - ) numbness, ( - ) tingling, ( - ) new weaknesses Behavioral/Psych: ( - ) mood change, ( - ) new changes  All other systems were reviewed with  the patient and are negative.  PHYSICAL EXAMINATION: ECOG PERFORMANCE STATUS: 1 - Symptomatic but completely ambulatory  Vitals:   06/10/23 0819  BP: 138/73  Pulse: 64  Resp: 13  Temp: (!) 97 F (36.1 C)  SpO2: 99%   Filed Weights   06/10/23 0819  Weight: 208 lb 12.8 oz (94.7 kg)     GENERAL: Well-appearing elderly male, alert, no distress and comfortable SKIN: skin color, texture, turgor are normal, no rashes or significant lesions EYES: conjunctiva are pink and non-injected, sclera clear NECK: supple, non-tender LYMPH:  no palpable lymphadenopathy in the cervical or supraclavicular regions.  LUNGS: clear to auscultation and percussion with normal breathing effort HEART: regular rate & rhythm and no murmurs and no lower extremity edema Musculoskeletal: no cyanosis of digits and no clubbing  PSYCH: alert & oriented x 3, fluent speech NEURO: no focal motor/sensory deficits  LABORATORY DATA:  I have reviewed the data as listed    Latest Ref Rng & Units 06/10/2023    7:55 AM 04/30/2023    9:33 AM 03/19/2023    9:09 AM  CBC  WBC 4.0 - 10.5 K/uL 166.0  127.0  95.1   Hemoglobin 13.0 - 17.0 g/dL 41.3  24.4  01.0   Hematocrit 39.0 - 52.0 % 36.7  36.7  35.0   Platelets 150 - 400 K/uL 104  109  113        Latest Ref Rng & Units 06/10/2023    7:55 AM 04/30/2023    9:33 AM 03/19/2023    9:09 AM  CMP  Glucose 70 - 99 mg/dL 85  272  536   BUN 8 - 23 mg/dL 30  26  27    Creatinine 0.61 - 1.24 mg/dL 6.44  0.34  7.42   Sodium 135 - 145 mmol/L 141  142  142   Potassium 3.5 - 5.1 mmol/L 4.8  4.5  4.7   Chloride 98 - 111 mmol/L 108  107  108   CO2 22 - 32 mmol/L 27  29  30    Calcium 8.9 - 10.3 mg/dL 9.5  9.3  9.7   Total  Protein 6.5 - 8.1 g/dL 7.3  7.4  7.0   Total Bilirubin 0.3 - 1.2 mg/dL 1.0  0.9  0.8   Alkaline Phos 38 - 126 U/L 83  86  72   AST 15 - 41 U/L 29  29  23    ALT 0 - 44 U/L 22  23  17      RADIOGRAPHIC STUDIES: No results found.  ASSESSMENT & PLAN RAMIREZ FULLBRIGHT is a 67 y.o. male with medical history significant for CLL who presents for a follow up visit.   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 Discontinued due to poor tolerance with medication.  --Labs show white blood cell 166, Hgb 10.6, MCV 90.6, Plt 104 --due to concern for inevitable progression will start Zanubrutinib 160 mg PO BID.  --Patient's labs today were borderline for consideration of starting treatment.  He and his sister were agreeable to start therapy given how close he is to the cytopenias needed to start treatment  --Return to clinic in 5 weeks for evaluation on the medication. Will see patient monthly x 3 months and then q 3 months thereafter.   No orders of the defined types were placed in this encounter.   All questions were answered. The patient knows to  call the clinic with any problems, questions or concerns.  I have spent a total of 30 minutes minutes of face-to-face and non-face-to-face time, preparing to see the patient, performing a medically appropriate examination, counseling and educating the patient, documenting clinical information in the electronic health record,and care coordination.   Ulysees Barns, MD Department of Hematology/Oncology [facility] Phone: [phone] Pager: [phone] Email: [email or web address]    06/10/2023 8:47 AM  Mathis Fare BD, Catovsky D, Caligaris-Cappio F, Dighiero G, Dhner H, [place], [place], [place] E, Chiorazzi N, [place], Rai KR, [place], Eichhorst B, O'Brien S,  Robak T, Seymour JF, Kipps TJ. iwCLL guidelines for diagnosis, indications for treatment, response assessment, and supportive management of CLL. Blood. 2018 Jun 21;131(25):2745-2760.  Active disease should be clearly documented to initiate therapy. At least 1 of the following criteria should be met.  1) Evidence of progressive marrow failure as manifested by the development of, or worsening of, anemia and/or thrombocytopenia. Cutoff levels of Hb <10 g/dL or platelet counts <629  109/L are generally regarded as indication for treatment. However, in some patients, platelet counts <100  109/L may remain stable over a long period; this situation does not automatically require therapeutic intervention. 2) Massive (ie, ?6 cm below the left costal margin) or progressive or symptomatic splenomegaly. 3) Massive nodes (ie, ?10 cm in longest diameter) or progressive or symptomatic lymphadenopathy. 4) Progressive lymphocytosis with an increase of ?50% over a 79-month period, or lymphocyte doubling time (LDT) <6 months. LDT can be obtained by linear regression extrapolation of absolute lymphocyte counts obtained at intervals of 2 weeks over an observation period of 2 to 3 months; patients with initial blood lymphocyte counts <30  109/L may require a longer observation period to determine the LDT. Factors contributing to lymphocytosis other than CLL (eg, infections, steroid administration) should be excluded. 5) Autoimmune complications including anemia or thrombocytopenia poorly responsive to corticosteroids. 6) Symptomatic or functional extranodal involvement (eg, skin, kidney, lung, spine). Disease-related symptoms as defined by any of the following: Unintentional weight loss ?10% within the previous 6 months. Significant fatigue (ie, ECOG performance scale 2 or worse; cannot work or unable to perform usual activities). Fevers ?100.52F or 38.0C for 2 or more weeks without evidence of infection. Night sweats  for ?1 month without evidence of infection.

## 2023-06-10 NOTE — Telephone Encounter (Signed)
Oral Oncology Pharmacist Encounter  Received new prescription for Brukinsa (zanubrutinib) for the treatment of CLL, planned duration until disease progression or unacceptable drug toxicity.  CBC w/ Diff and CMP from 06/10/23 assessed, patient with WBC 166 K/uL (secondary to disease), pltc of 104 K/uL and Scr of 1.44 mg/dL (CrCl ~16.1 mL/min). No baseline dose adjustments required at this time. Prescription dose and frequency assessed for appropriateness.  Current medication list in Epic reviewed, no relevant/significant DDIs with Brukinsa identified.  Evaluated chart and no patient barriers to medication adherence noted.   Patient agreement for treatment documented in MD note on 06/10/23.  Prescription has been e-scribed to the [facility] for benefits analysis and approval.  Oral Oncology Clinic will continue to follow for insurance authorization, copayment issues, initial counseling and start date.  Lenord Carbo, PharmD, BCPS, [facility] Hematology/Oncology Clinical Pharmacist [facility] and [facility] Oral Chemotherapy Navigation Clinics [phone] 06/10/2023 9:24 AM

## 2023-06-10 NOTE — Telephone Encounter (Signed)
Oral Oncology Patient Advocate Encounter   Received notification that prior authorization for Brukinsa is required.   PA submitted on 06/10/23 Key [id number] Status is pending     Jinger Neighbors, CPhT-Adv Oncology Pharmacy Patient Advocate [facility] Direct Number: [phone]  Fax: [phone]

## 2023-06-24 DIAGNOSIS — E1169 Type 2 diabetes mellitus with other specified complication: Secondary | ICD-10-CM | POA: Diagnosis not present

## 2023-06-24 DIAGNOSIS — I10 Essential (primary) hypertension: Secondary | ICD-10-CM | POA: Diagnosis not present

## 2023-06-29 DIAGNOSIS — E1169 Type 2 diabetes mellitus with other specified complication: Secondary | ICD-10-CM | POA: Diagnosis not present

## 2023-07-01 ENCOUNTER — Other Ambulatory Visit (HOSPITAL_COMMUNITY): Payer: Self-pay

## 2023-07-03 ENCOUNTER — Other Ambulatory Visit (HOSPITAL_COMMUNITY): Payer: Self-pay

## 2023-07-03 ENCOUNTER — Other Ambulatory Visit: Payer: Self-pay

## 2023-07-06 ENCOUNTER — Other Ambulatory Visit: Payer: Self-pay

## 2023-07-08 ENCOUNTER — Telehealth: Payer: Self-pay | Admitting: Pharmacist

## 2023-07-08 ENCOUNTER — Other Ambulatory Visit: Payer: Self-pay

## 2023-07-08 DIAGNOSIS — Z20822 Contact with and (suspected) exposure to covid-19: Secondary | ICD-10-CM | POA: Diagnosis not present

## 2023-07-08 NOTE — Telephone Encounter (Signed)
Oral Chemotherapy Pharmacist Encounter   Notified by outpatient pharmacy that patient was started on Paxlovid for Covid. Confirmed with Cody Kaufmann, PA-C for patient to hold Brukinsa while on Paxlovid.   Called patient's daughter, Cody [name] back to discuss holding Brukinsa. Cody [name] expressed understanding. Patient took AM dose of Brukinsa today (07/08/23) but will not take any more until finishing up 5 day course of Paxlovid.   I also reviewed patient's other medications with Cody [name]. Recommendation made for patient to hold atorvastatin while on Paxlvoid. We discussed also possibility for increased risk of hypotension from amlodipine in combination with Paxlovid. Cody [name] stated they take Cody [name] BP twice daily, and will hold this if they notice his BP readings decreasing or if he is having s/sx of hypotension.   Patient's daughter expressed appreciation and understanding. Patient's family knows to call the office with questions or concerns.  Cody [name], PharmD, BCPS, [facility] Hematology/Oncology Clinical Pharmacist [facility] and [facility] Oral Chemotherapy Navigation Clinics [phone] 07/08/2023 1:47 PM

## 2023-07-10 ENCOUNTER — Other Ambulatory Visit (HOSPITAL_COMMUNITY): Payer: Self-pay

## 2023-07-13 ENCOUNTER — Telehealth: Payer: Self-pay | Admitting: Hematology and Oncology

## 2023-07-13 ENCOUNTER — Telehealth: Payer: Self-pay | Admitting: *Deleted

## 2023-07-13 NOTE — Telephone Encounter (Signed)
Received vm message from pt's sister, Cody [name]. She states that pt is finishing up a prescription for Paxlovid. He has an appt here on Wednesday. She is asking if he needs to re-schedule his visit this week. TCT to pt's sister.  Spoke with her. Asked her when [place] tested + for Covid. He tested + on 07/08/23. Today is his last day of Paxlovid. He still has a cough but not fever. Advised we will need to reschedule his appt from 07/15/23 to the week of 07/20/23  Scheduling message sent to have pt's appts rescheduled.

## 2023-07-15 ENCOUNTER — Inpatient Hospital Stay: Payer: Medicare PPO

## 2023-07-15 ENCOUNTER — Inpatient Hospital Stay: Payer: Medicare PPO | Admitting: Hematology and Oncology

## 2023-07-21 ENCOUNTER — Telehealth: Payer: Self-pay | Admitting: Hematology and Oncology

## 2023-07-22 ENCOUNTER — Inpatient Hospital Stay: Payer: Medicare PPO

## 2023-07-22 ENCOUNTER — Inpatient Hospital Stay: Payer: Medicare PPO | Admitting: Hematology and Oncology

## 2023-07-30 ENCOUNTER — Other Ambulatory Visit (HOSPITAL_COMMUNITY): Payer: Self-pay

## 2023-08-03 ENCOUNTER — Other Ambulatory Visit (HOSPITAL_COMMUNITY): Payer: Self-pay

## 2023-08-03 ENCOUNTER — Other Ambulatory Visit: Payer: Self-pay

## 2023-08-07 ENCOUNTER — Inpatient Hospital Stay (HOSPITAL_BASED_OUTPATIENT_CLINIC_OR_DEPARTMENT_OTHER): Payer: Medicare PPO | Admitting: Hematology and Oncology

## 2023-08-07 ENCOUNTER — Inpatient Hospital Stay: Payer: Medicare PPO | Attending: Hematology and Oncology

## 2023-08-07 VITALS — BP 157/83 | HR 65 | Temp 97.8°F | Resp 13 | Wt 200.0 lb

## 2023-08-07 DIAGNOSIS — C9110 Chronic lymphocytic leukemia of B-cell type not having achieved remission: Secondary | ICD-10-CM | POA: Diagnosis not present

## 2023-08-07 DIAGNOSIS — D696 Thrombocytopenia, unspecified: Secondary | ICD-10-CM

## 2023-08-07 DIAGNOSIS — Z79899 Other long term (current) drug therapy: Secondary | ICD-10-CM | POA: Diagnosis not present

## 2023-08-07 DIAGNOSIS — Z8616 Personal history of COVID-19: Secondary | ICD-10-CM | POA: Insufficient documentation

## 2023-08-07 LAB — CBC WITH DIFFERENTIAL (CANCER CENTER ONLY)
Abs Immature Granulocytes: 0.15 10*3/uL — ABNORMAL HIGH (ref 0.00–0.07)
Basophils Absolute: 0 10*3/uL (ref 0.0–0.1)
Basophils Relative: 0 %
Eosinophils Absolute: 0.2 10*3/uL (ref 0.0–0.5)
Eosinophils Relative: 0 %
HCT: 34.2 % — ABNORMAL LOW (ref 39.0–52.0)
Hemoglobin: 10 g/dL — ABNORMAL LOW (ref 13.0–17.0)
Immature Granulocytes: 0 %
Lymphocytes Relative: 95 %
Lymphs Abs: 90.6 10*3/uL — ABNORMAL HIGH (ref 0.7–4.0)
MCH: 26.5 pg (ref 26.0–34.0)
MCHC: 29.2 g/dL — ABNORMAL LOW (ref 30.0–36.0)
MCV: 90.7 fL (ref 80.0–100.0)
Monocytes Absolute: 1 10*3/uL (ref 0.1–1.0)
Monocytes Relative: 1 %
Neutro Abs: 3.7 10*3/uL (ref 1.7–7.7)
Neutrophils Relative %: 4 %
Platelet Count: 112 10*3/uL — ABNORMAL LOW (ref 150–400)
RBC: 3.77 MIL/uL — ABNORMAL LOW (ref 4.22–5.81)
RDW: 16.3 % — ABNORMAL HIGH (ref 11.5–15.5)
Smear Review: NORMAL
WBC Count: 95.6 10*3/uL (ref 4.0–10.5)
nRBC: 0 % (ref 0.0–0.2)

## 2023-08-07 LAB — CMP (CANCER CENTER ONLY)
ALT: 19 U/L (ref 0–44)
AST: 17 U/L (ref 15–41)
Albumin: 4.4 g/dL (ref 3.5–5.0)
Alkaline Phosphatase: 69 U/L (ref 38–126)
Anion gap: 5 (ref 5–15)
BUN: 24 mg/dL — ABNORMAL HIGH (ref 8–23)
CO2: 27 mmol/L (ref 22–32)
Calcium: 9.1 mg/dL (ref 8.9–10.3)
Chloride: 109 mmol/L (ref 98–111)
Creatinine: 1.25 mg/dL — ABNORMAL HIGH (ref 0.61–1.24)
GFR, Estimated: >60 mL/min (ref >60)
Glucose, Bld: 80 mg/dL (ref 70–99)
Potassium: 4.4 mmol/L (ref 3.5–5.1)
Sodium: 141 mmol/L (ref 135–145)
Total Bilirubin: 0.8 mg/dL (ref 0.3–1.2)
Total Protein: 7.4 g/dL (ref 6.5–8.1)

## 2023-08-07 LAB — LACTATE DEHYDROGENASE: LDH: 157 U/L (ref 98–192)

## 2023-08-07 NOTE — Progress Notes (Unsigned)
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   (2) on maintenance rituximab, first dose 03/07/2019, repeated every 3 months             (a) baseline PET scan obtained on 02/16/2020 was Deauville 2 except for the left iliac nodes, with SUV max 3.1             (b) rituximab decreased to every 6 months beginning October 2021    Interval History:  Cody [name] 67 y.o. male with medical history significant for CLL previously on monotherapy rituximab who presents for a follow up visit. The patient's last visit was on 06/10/2023. In the interim since the last visit he has not had any major changes in his health.   On exam today Cody [name] reports ***  Overall he is at his baseline level of health.  He denies fevers, chills, sweats, shortness of breath, chest pain or cough. He has no other complaints. A full 10 point ROS is listed below.   MEDICAL HISTORY:  Past Medical History:  Diagnosis Date   Alcoholic cirrhosis (HCC)    Cataract    Diabetes mellitus without complication (HCC)    High blood pressure 10/24/2004   History of alcohol abuse    Hypercholesterolemia     SURGICAL HISTORY: No past surgical history on file.  SOCIAL HISTORY: Social History   Socioeconomic History   Marital status: Divorced    Spouse name: Not on file   Number of children: Not on file   Years of education: Not on file   Highest education level: Not on file  Occupational History   Not on file  Tobacco Use    Smoking status: Never   Smokeless tobacco: Never  Substance and Sexual Activity   Alcohol use: Not Currently   Drug use: No   Sexual activity: Not on file  Other Topics Concern   Not on file  Social History Narrative   Not on file   Social Determinants of Health   Financial Resource Strain: Not on file  Food Insecurity: No Food Insecurity (12/05/2022)   Hunger Vital Sign    Worried About Running Out of Food in the Last Year: Never true    Ran Out of Food in the Last Year: Never true  Transportation Needs: No Transportation Needs (12/05/2022)   PRAPARE - Administrator, Civil Service (Medical): No    Lack of Transportation (Non-Medical): No  Physical Activity: Not on file  Stress: Not on file  Social Connections: Not on file  Intimate Partner Violence: Not At Risk (12/05/2022)   Humiliation, Afraid, Rape, and Kick questionnaire    Fear of Current or Ex-Partner: No    Emotionally Abused: No    Physically Abused: No    Sexually Abused: No    FAMILY HISTORY: Family History  Problem Relation Age of Onset   Alcohol abuse Father    Cirrhosis Father  Coronary artery disease Father     ALLERGIES:  is allergic to rituxan [rituximab].  MEDICATIONS:  Current Outpatient Medications  Medication Sig Dispense Refill   allopurinol (ZYLOPRIM) 300 MG tablet TAKE 1 TABLET DAILY (Patient taking differently: Take 300 mg by mouth in the morning.) 90 tablet 3   amLODipine (NORVASC) 10 MG tablet Take 10 mg by mouth at bedtime.     atorvastatin (LIPITOR) 10 MG tablet Take 10 mg by mouth at bedtime.     B-D UF III MINI PEN NEEDLES 31G X 5 MM MISC      Blood Glucose Monitoring Suppl (ACCU-CHEK GUIDE) w/Device KIT      dapagliflozin propanediol (FARXIGA) 10 MG TABS tablet Take 10 mg by mouth daily.     fexofenadine (ALLEGRA) 180 MG tablet Take 180 mg by mouth at bedtime.     metFORMIN (GLUCOPHAGE-XR) 500 MG 24 hr tablet Take 1 tablet (500 mg total) by mouth 2 (two) times daily with  a meal. Take 500 mg by mouth in the morning and 1,000 mg at bedtime     ondansetron (ZOFRAN) 4 MG tablet TAKE 1 TABLET BY MOUTH EVERY 8 HOURS AS NEEDED FOR NAUSEA AND VOMITING (Patient not taking: Reported on 03/19/2023) 8 tablet 2   ONETOUCH DELICA LANCETS FINE MISC 3 (three) times daily.   5   TOUJEO SOLOSTAR 300 UNIT/ML SOPN Inject 5-30 Units into the skin See admin instructions. Inject 5-15 units into the skin in the morning and 30 units at bedtime, PER SLIDING SCALE (FOR BOTH)     zanubrutinib (BRUKINSA) 80 MG capsule Take 2 capsules (160 mg total) by mouth 2 (two) times daily. 120 capsule 2   No current facility-administered medications for this visit.    REVIEW OF SYSTEMS:   Constitutional: ( - ) fevers, ( - )  chills , ( - ) night sweats Eyes: ( - ) blurriness of vision, ( - ) double vision, ( - ) watery eyes Ears, nose, mouth, throat, and face: ( - ) mucositis, ( - ) sore throat Respiratory: ( - ) cough, ( - ) dyspnea, ( - ) wheezes Cardiovascular: ( - ) palpitation, ( - ) chest discomfort, ( - ) lower extremity swelling Gastrointestinal:  ( - ) nausea, ( - ) heartburn, ( - ) change in bowel habits Skin: ( - ) abnormal skin rashes Lymphatics: ( - ) new lymphadenopathy, ( - ) easy bruising Neurological: ( - ) numbness, ( - ) tingling, ( - ) new weaknesses Behavioral/Psych: ( - ) mood change, ( - ) new changes  All other systems were reviewed with the patient and are negative.  PHYSICAL EXAMINATION: ECOG PERFORMANCE STATUS: 1 - Symptomatic but completely ambulatory  There were no vitals filed for this visit.  There were no vitals filed for this visit.    GENERAL: Well-appearing elderly male, alert, no distress and comfortable SKIN: skin color, texture, turgor are normal, no rashes or significant lesions EYES: conjunctiva are pink and non-injected, sclera clear NECK: supple, non-tender LYMPH:  no palpable lymphadenopathy in the cervical or supraclavicular regions.  LUNGS:  clear to auscultation and percussion with normal breathing effort HEART: regular rate & rhythm and no murmurs and no lower extremity edema Musculoskeletal: no cyanosis of digits and no clubbing  PSYCH: alert & oriented x 3, fluent speech NEURO: no focal motor/sensory deficits  LABORATORY DATA:  I have reviewed the data as listed    Latest Ref Rng & Units 06/10/2023  7:55 AM 04/30/2023    9:33 AM 03/19/2023    9:09 AM  CBC  WBC 4.0 - 10.5 K/uL 166.0  127.0  95.1   Hemoglobin 13.0 - 17.0 g/dL 16.1  09.6  04.5   Hematocrit 39.0 - 52.0 % 36.7  36.7  35.0   Platelets 150 - 400 K/uL 104  109  113        Latest Ref Rng & Units 06/10/2023    7:55 AM 04/30/2023    9:33 AM 03/19/2023    9:09 AM  CMP  Glucose 70 - 99 mg/dL 85  409  811   BUN 8 - 23 mg/dL 30  26  27    Creatinine 0.61 - 1.24 mg/dL 9.14  7.82  9.56   Sodium 135 - 145 mmol/L 141  142  142   Potassium 3.5 - 5.1 mmol/L 4.8  4.5  4.7   Chloride 98 - 111 mmol/L 108  107  108   CO2 22 - 32 mmol/L 27  29  30    Calcium 8.9 - 10.3 mg/dL 9.5  9.3  9.7   Total Protein 6.5 - 8.1 g/dL 7.3  7.4  7.0   Total Bilirubin 0.3 - 1.2 mg/dL 1.0  0.9  0.8   Alkaline Phos 38 - 126 U/L 83  86  72   AST 15 - 41 U/L 29  29  23    ALT 0 - 44 U/L 22  23  17      RADIOGRAPHIC STUDIES: No results found.  ASSESSMENT & PLAN Cody [name] is a 67 y.o. male with medical history significant for CLL who presents for a follow up visit.   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 Discontinued due to poor tolerance with medication.  --Labs show white blood cell *** --continue Zanubrutinib 160 mg PO BID.  --Patient's labs were borderline for consideration of starting treatment.  He and his sister were agreeable to start therapy given how close he is to the cytopenias needed to start treatment  --Return to clinic in 5 weeks for  evaluation on the medication. Will see patient monthly x 3 months and then q 3 months thereafter. RTC in 4 weeks  No orders of the defined types were placed in this encounter.   All questions were answered. The patient knows to call the clinic with any problems, questions or concerns.  I have spent a total of 30 minutes minutes of face-to-face and non-face-to-face time, preparing to see the patient, performing a medically appropriate examination, counseling and educating the patient, documenting clinical information in the electronic health record,and care coordination.   Ulysees Barns, MD Department of Hematology/Oncology [facility] at [facility] Phone: [phone] Pager: [phone] Email: [email or web address]    08/07/2023 7:12 AM  Mathis Fare BD, Catovsky D, Caligaris-Cappio F, Dighiero G, Dhner H, [place], [place], [place] E, Chiorazzi N, [place], Rai KR, [place], Eichhorst B, O'Brien S, Robak T, Seymour JF, Kipps TJ. iwCLL guidelines for diagnosis, indications for treatment, response assessment, and supportive management of CLL. Blood. 2018 Jun 21;131(25):2745-2760.  Active disease should be clearly documented to initiate therapy. At least 1 of the following criteria should be met.  1) Evidence of progressive marrow failure as manifested by the development of, or worsening of, anemia and/or thrombocytopenia. Cutoff levels of Hb <10 g/dL or platelet counts <324  109/L are  generally regarded as indication for treatment. However, in some patients, platelet counts <100  109/L may remain stable over a long period; this situation does not automatically require therapeutic intervention. 2) Massive (ie, >=6 cm below the left costal margin) or progressive or symptomatic splenomegaly. 3) Massive nodes (ie, >=10 cm in longest diameter) or progressive or symptomatic lymphadenopathy. 4) Progressive lymphocytosis with an increase of >=50% over  a 54-month period, or lymphocyte doubling time (LDT) <6 months. LDT can be obtained by linear regression extrapolation of absolute lymphocyte counts obtained at intervals of 2 weeks over an observation period of 2 to 3 months; patients with initial blood lymphocyte counts <30  109/L may require a longer observation period to determine the LDT. Factors contributing to lymphocytosis other than CLL (eg, infections, steroid administration) should be excluded. 5) Autoimmune complications including anemia or thrombocytopenia poorly responsive to corticosteroids. 6) Symptomatic or functional extranodal involvement (eg, skin, kidney, lung, spine). Disease-related symptoms as defined by any of the following: Unintentional weight loss >=10% within the previous 6 months. Significant fatigue (ie, ECOG performance scale 2 or worse; cannot work or unable to perform usual activities). Fevers >=100.65F or 38.0C for 2 or more weeks without evidence of infection. Night sweats for >=1 month without evidence of infection.

## 2023-08-17 DIAGNOSIS — C9191 Lymphoid leukemia, unspecified, in remission: Secondary | ICD-10-CM | POA: Diagnosis not present

## 2023-08-17 DIAGNOSIS — E1169 Type 2 diabetes mellitus with other specified complication: Secondary | ICD-10-CM | POA: Diagnosis not present

## 2023-08-17 DIAGNOSIS — I129 Hypertensive chronic kidney disease with stage 1 through stage 4 chronic kidney disease, or unspecified chronic kidney disease: Secondary | ICD-10-CM | POA: Diagnosis not present

## 2023-08-17 DIAGNOSIS — N183 Chronic kidney disease, stage 3 unspecified: Secondary | ICD-10-CM | POA: Diagnosis not present

## 2023-08-17 DIAGNOSIS — E782 Mixed hyperlipidemia: Secondary | ICD-10-CM | POA: Diagnosis not present

## 2023-08-20 ENCOUNTER — Ambulatory Visit (INDEPENDENT_AMBULATORY_CARE_PROVIDER_SITE_OTHER): Payer: Medicare PPO | Admitting: Podiatry

## 2023-08-20 ENCOUNTER — Encounter: Payer: Self-pay | Admitting: Podiatry

## 2023-08-20 DIAGNOSIS — E1151 Type 2 diabetes mellitus with diabetic peripheral angiopathy without gangrene: Secondary | ICD-10-CM

## 2023-08-20 DIAGNOSIS — L84 Corns and callosities: Secondary | ICD-10-CM | POA: Diagnosis not present

## 2023-08-20 DIAGNOSIS — M79675 Pain in left toe(s): Secondary | ICD-10-CM | POA: Diagnosis not present

## 2023-08-20 DIAGNOSIS — B351 Tinea unguium: Secondary | ICD-10-CM

## 2023-08-20 DIAGNOSIS — M79674 Pain in right toe(s): Secondary | ICD-10-CM | POA: Diagnosis not present

## 2023-08-20 NOTE — Progress Notes (Signed)
This patient presents to the office with chief complaint of long thick painful nails.  Patient says the nails are painful walking and wearing shoes.  This patient is unable to self treat.  This patient is unable to trim his nails since he is unable to reach his  nails.  He has painful callus right forefoot. he presents to the office for preventative foot care services.    General Appearance  Alert, conversant and in no acute stress.  Vascular  Dorsalis pedis and posterior tibial  pulses are palpable  bilaterally.  Capillary return is within normal limits  bilaterally. Temperature is within normal limits  bilaterally.  Neurologic  Senn-Weinstein monofilament wire test within normal limits  bilaterally. Muscle power within normal limits bilaterally.  Nails Thick disfigured discolored nails with subungual debris  from hallux to fifth toes bilaterally. No evidence of bacterial infection or drainage bilaterally.   Orthopedic  No limitations of motion  feet .  No crepitus or effusions noted.  No bony pathology .  Syndactaly 2-3 left foot.  Skin  normotropic skin with  noted bilaterally.  No signs of infections or ulcers noted.   Porokeratosis sub 2 right foot.  Onychomycosis  Nails  B/L.  Pain in right toes  Pain in left toes  Callus sub 2 right.  Debridement of nails both feet followed trimming the nails with dremel tool.  Debridement of callus with # 15 blade and dremel tool.    RTC  3  months.   Helane Gunther DPM

## 2023-08-28 ENCOUNTER — Other Ambulatory Visit: Payer: Self-pay

## 2023-08-31 ENCOUNTER — Other Ambulatory Visit (HOSPITAL_COMMUNITY): Payer: Self-pay

## 2023-09-01 ENCOUNTER — Other Ambulatory Visit: Payer: Self-pay

## 2023-09-01 ENCOUNTER — Other Ambulatory Visit: Payer: Self-pay | Admitting: Hematology and Oncology

## 2023-09-01 MED ORDER — BRUKINSA 80 MG PO CAPS
160.0000 mg | ORAL_CAPSULE | Freq: Two times a day (BID) | ORAL | 2 refills | Status: DC
  Filled 2023-09-01: qty 120, 30d supply, fill #0
  Filled 2023-10-12: qty 120, 30d supply, fill #1
  Filled 2023-10-29: qty 120, 30d supply, fill #2

## 2023-09-01 NOTE — Progress Notes (Signed)
Refill received

## 2023-09-01 NOTE — Progress Notes (Signed)
Specialty Pharmacy Refill Coordination Note  Cody [name] is a 67 y.o. male contacted today regarding refills of specialty medication(s) Zanubrutinib   Patient requested Delivery   Delivery date: 09/04/23   Verified address: [address], [place] [address]   Medication will be filled on 10/10. Sent RR to provider.

## 2023-09-01 NOTE — Progress Notes (Signed)
Clinical Intervention Notes: Patient sister states that provider informed of possible chest pain with Brukinsa and that patient may take Tylenol as needed. Family provided patient with a bottle of 100ct 500mg  Tylenol and states that the bottle is almost gone. Sister requests proper dosage instructions to determine if he took too many. Advised of maximum dosage of 1000mg  every 6 hours for which the bottle would have lasted approximately 12 days. Based on this sister does not believe he was overdosing but I informed of signs of acute liver injury to monitor for and going forward they requested that he call a family member each time he needs to take Tylenol for them to track his dosing. Additionally sister requested information on patient starting a multivitamin. Advised that as long as it does not contain Calcium or Iron it is safe, alternatively if it does contain these to space from regular medications.   Clinical Intervention Outcomes: Prevention of an adverse drug event

## 2023-09-01 NOTE — Progress Notes (Signed)
Specialty Pharmacy Ongoing Clinical Assessment Note  Cody [name] is a 66 y.o. male who is being followed by the specialty pharmacy service for RxSp Oncology   Patient's specialty medication(s) reviewed today: Zanubrutinib   Missed doses in the last 4 weeks: 0   Patient did not have any additional questions or concerns.   Therapeutic benefit summary: Patient is achieving benefit   Adverse events/side effects summary: No adverse events/side effects   Patient's therapy is appropriate to: Continue    Goals Addressed             This Visit's Progress    Slow Disease Progression       Patient is on track. Patient will maintain adherence and adhere to provider and/or lab appointments         Follow up:  3 months

## 2023-09-03 ENCOUNTER — Other Ambulatory Visit: Payer: Self-pay | Admitting: Physician Assistant

## 2023-09-03 DIAGNOSIS — C9110 Chronic lymphocytic leukemia of B-cell type not having achieved remission: Secondary | ICD-10-CM

## 2023-09-04 ENCOUNTER — Inpatient Hospital Stay: Payer: Medicare PPO | Admitting: Physician Assistant

## 2023-09-04 ENCOUNTER — Inpatient Hospital Stay: Payer: Medicare PPO | Attending: Hematology and Oncology

## 2023-09-04 VITALS — BP 144/67 | HR 65 | Temp 97.9°F | Resp 17 | Ht 72.0 in | Wt 194.8 lb

## 2023-09-04 DIAGNOSIS — I10 Essential (primary) hypertension: Secondary | ICD-10-CM | POA: Insufficient documentation

## 2023-09-04 DIAGNOSIS — Z79899 Other long term (current) drug therapy: Secondary | ICD-10-CM | POA: Diagnosis not present

## 2023-09-04 DIAGNOSIS — C9110 Chronic lymphocytic leukemia of B-cell type not having achieved remission: Secondary | ICD-10-CM | POA: Diagnosis not present

## 2023-09-04 LAB — CBC WITH DIFFERENTIAL (CANCER CENTER ONLY)
Abs Immature Granulocytes: 0.11 10*3/uL — ABNORMAL HIGH (ref 0.00–0.07)
Basophils Absolute: 0 10*3/uL (ref 0.0–0.1)
Basophils Relative: 0 %
Eosinophils Absolute: 0.1 10*3/uL (ref 0.0–0.5)
Eosinophils Relative: 0 %
HCT: 36.4 % — ABNORMAL LOW (ref 39.0–52.0)
Hemoglobin: 10.9 g/dL — ABNORMAL LOW (ref 13.0–17.0)
Immature Granulocytes: 0 %
Lymphocytes Relative: 94 %
Lymphs Abs: 63.9 10*3/uL — ABNORMAL HIGH (ref 0.7–4.0)
MCH: 26.7 pg (ref 26.0–34.0)
MCHC: 29.9 g/dL — ABNORMAL LOW (ref 30.0–36.0)
MCV: 89 fL (ref 80.0–100.0)
Monocytes Absolute: 0.6 10*3/uL (ref 0.1–1.0)
Monocytes Relative: 1 %
Neutro Abs: 3.5 10*3/uL (ref 1.7–7.7)
Neutrophils Relative %: 5 %
Platelet Count: 134 10*3/uL — ABNORMAL LOW (ref 150–400)
RBC: 4.09 MIL/uL — ABNORMAL LOW (ref 4.22–5.81)
RDW: 15.6 % — ABNORMAL HIGH (ref 11.5–15.5)
Smear Review: NORMAL
WBC Count: 68.2 10*3/uL (ref 4.0–10.5)
nRBC: 0 % (ref 0.0–0.2)

## 2023-09-04 LAB — CMP (CANCER CENTER ONLY)
ALT: 25 U/L (ref 0–44)
AST: 24 U/L (ref 15–41)
Albumin: 4.6 g/dL (ref 3.5–5.0)
Alkaline Phosphatase: 71 U/L (ref 38–126)
Anion gap: 3 — ABNORMAL LOW (ref 5–15)
BUN: 29 mg/dL — ABNORMAL HIGH (ref 8–23)
CO2: 32 mmol/L (ref 22–32)
Calcium: 9.8 mg/dL (ref 8.9–10.3)
Chloride: 105 mmol/L (ref 98–111)
Creatinine: 1.15 mg/dL (ref 0.61–1.24)
GFR, Estimated: >60 mL/min (ref >60)
Glucose, Bld: 110 mg/dL — ABNORMAL HIGH (ref 70–99)
Potassium: 4.5 mmol/L (ref 3.5–5.1)
Sodium: 140 mmol/L (ref 135–145)
Total Bilirubin: 0.7 mg/dL (ref 0.3–1.2)
Total Protein: 7.6 g/dL (ref 6.5–8.1)

## 2023-09-04 LAB — LACTATE DEHYDROGENASE: LDH: 145 U/L (ref 98–192)

## 2023-09-04 NOTE — Progress Notes (Signed)
[facility] [phone]   [phone]  PROGRESS NOTE  Patient Care Team: Mirna Mires, MD as PCP - General (Family Medicine) Jaci Standard, MD as Consulting Physician (Hematology and Oncology)  Hematological/Oncological History # CLL --diagnosis of chronic lymphoid leukemia made 11/04/2018             (a) the cells are positive for CD 04/12/19/22/23, kappa restricted   (1) Rituximab weekly x 9 weeks beginning 11/23/2018             (a) reaction to first dose (which was 100 mg only).   (2) on maintenance rituximab, first dose 03/07/2019, repeated every 3 months             (a) baseline PET scan obtained on 02/16/2020 was Deauville 2 except for the left iliac nodes, with SUV max 3.1             (b) rituximab decreased to every 6 months beginning October 2021   (3) Switched to Zanubrutinib 160 mg PO BID in July 2024.   Interval History:  Discussed the use of AI scribe software for clinical note transcription with the patient, who gave verbal consent to proceed.  Cody [name], a patient with Chronic Lymphocytic Leukemia (CLL), presents for a follow-up visit while on zanubrutinib. The last visit was on 08/07/2023. In the interim since the last visit he has not had any major changes in his health.  He reports improvement in chest pain since the last visit. His energy levels are satisfactory. He has experienced some dry skin, a known side effect of zanubrutinib, managed with lotion. He denies any nausea, vomiting, or bowel movement issues. He reports no bleeding, except for a minor incident involving a popped blackhead. He has been experiencing frequent urination, which he attributes to his blood pressure medication. He has been maintaining physical activity by walking on a treadmill.  He also notes no infectious symptoms of any kind.  Overall,  he is at his baseline level of health.  He denies fevers, chills, sweats, shortness of breath or cough. He has no  other complaints. A full 10 point ROS is listed below.   MEDICAL HISTORY:  Past Medical History:  Diagnosis Date   Alcoholic cirrhosis (HCC)    Cataract    Diabetes mellitus without complication (HCC)    High blood pressure 10/24/2004   History of alcohol abuse    Hypercholesterolemia     SURGICAL HISTORY: No past surgical history on file.  SOCIAL HISTORY: Social History   Socioeconomic History   Marital status: Divorced    Spouse name: Not on file   Number of children: Not on file   Years of education: Not on file   Highest education level: Not on file  Occupational History   Not on file  Tobacco Use   Smoking status: Never   Smokeless tobacco: Never  Substance and Sexual Activity   Alcohol use: Not Currently   Drug use: No   Sexual activity: Not on file  Other Topics Concern   Not on file  Social History Narrative   Not on file   Social Determinants of Health   Financial Resource Strain: Not on file  Food Insecurity: No Food Insecurity (12/05/2022)   Hunger Vital Sign    Worried About Running Out of Food in the Last Year: Never true    Ran Out of Food in the Last Year: Never true  Transportation Needs: No Transportation Needs (12/05/2022)  PRAPARE - Administrator, Civil Service (Medical): No    Lack of Transportation (Non-Medical): No  Physical Activity: Not on file  Stress: Not on file  Social Connections: Not on file  Intimate Partner Violence: Not At Risk (12/05/2022)   Humiliation, Afraid, Rape, and Kick questionnaire    Fear of Current or Ex-Partner: No    Emotionally Abused: No    Physically Abused: No    Sexually Abused: No    FAMILY HISTORY: Family History  Problem Relation Age of Onset   Alcohol abuse Father    Cirrhosis Father    Coronary artery disease Father     ALLERGIES:  is allergic to rituxan [rituximab].  MEDICATIONS:  Current Outpatient Medications  Medication Sig Dispense Refill   hydrochlorothiazide  (HYDRODIURIL) 25 MG tablet Take 25 mg by mouth daily.     telmisartan (MICARDIS) 80 MG tablet Take 80 mg by mouth daily.     allopurinol (ZYLOPRIM) 300 MG tablet TAKE 1 TABLET DAILY (Patient taking differently: Take 300 mg by mouth in the morning.) 90 tablet 3   amLODipine (NORVASC) 10 MG tablet Take 10 mg by mouth at bedtime.     atorvastatin (LIPITOR) 10 MG tablet Take 10 mg by mouth at bedtime.     B-D UF III MINI PEN NEEDLES 31G X 5 MM MISC      Blood Glucose Monitoring Suppl (ACCU-CHEK GUIDE) w/Device KIT      dapagliflozin propanediol (FARXIGA) 10 MG TABS tablet Take 10 mg by mouth daily.     fexofenadine (ALLEGRA) 180 MG tablet Take 180 mg by mouth at bedtime.     metFORMIN (GLUCOPHAGE-XR) 500 MG 24 hr tablet Take 1 tablet (500 mg total) by mouth 2 (two) times daily with a meal. Take 500 mg by mouth in the morning and 1,000 mg at bedtime     ondansetron (ZOFRAN) 4 MG tablet TAKE 1 TABLET BY MOUTH EVERY 8 HOURS AS NEEDED FOR NAUSEA AND VOMITING (Patient not taking: Reported on 03/19/2023) 8 tablet 2   ONETOUCH DELICA LANCETS FINE MISC 3 (three) times daily.   5   TOUJEO SOLOSTAR 300 UNIT/ML SOPN Inject 5-30 Units into the skin See admin instructions. Inject 5-15 units into the skin in the morning and 30 units at bedtime, PER SLIDING SCALE (FOR BOTH)     zanubrutinib (BRUKINSA) 80 MG capsule Take 2 capsules (160 mg total) by mouth 2 (two) times daily. 120 capsule 2   No current facility-administered medications for this visit.    REVIEW OF SYSTEMS:   Constitutional: ( - ) fevers, ( - )  chills , ( - ) night sweats Eyes: ( - ) blurriness of vision, ( - ) double vision, ( - ) watery eyes Ears, nose, mouth, throat, and face: ( - ) mucositis, ( - ) sore throat Respiratory: ( - ) cough, ( - ) dyspnea, ( - ) wheezes Cardiovascular: ( - ) palpitation, ( - ) chest discomfort, ( - ) lower extremity swelling Gastrointestinal:  ( - ) nausea, ( - ) heartburn, ( - ) change in bowel habits Skin: ( - )  abnormal skin rashes Lymphatics: ( - ) new lymphadenopathy, ( - ) easy bruising Neurological: ( - ) numbness, ( - ) tingling, ( - ) new weaknesses Behavioral/Psych: ( - ) mood change, ( - ) new changes  All other systems were reviewed with the patient and are negative.  PHYSICAL EXAMINATION: ECOG PERFORMANCE STATUS: 1 - Symptomatic but  completely ambulatory  Vitals:   09/04/23 1056 09/04/23 1100  BP: (!) 153/71 (!) 144/67  Pulse: 65   Resp: 17   Temp: 97.9 F (36.6 C)   SpO2: 98%     Filed Weights   09/04/23 1056  Weight: 194 lb 12.8 oz (88.4 kg)      GENERAL: Well-appearing elderly male, alert, no distress and comfortable SKIN: skin color, texture, turgor are normal, no rashes or significant lesions EYES: conjunctiva are pink and non-injected, sclera clear LYMPH:  no palpable lymphadenopathy in the cervical or supraclavicular regions.  LUNGS: clear to auscultation and percussion with normal breathing effort HEART: regular rate & rhythm and no murmurs and no lower extremity edema Musculoskeletal: no cyanosis of digits and no clubbing  PSYCH: alert & oriented x 3, fluent speech NEURO: no focal motor/sensory deficits  LABORATORY DATA:  I have reviewed the data as listed    Latest Ref Rng & Units 09/04/2023   10:14 AM 08/07/2023   10:20 AM 06/10/2023    7:55 AM  CBC  WBC 4.0 - 10.5 K/uL 68.2  95.6  166.0   Hemoglobin 13.0 - 17.0 g/dL 62.1  30.8  65.7   Hematocrit 39.0 - 52.0 % 36.4  34.2  36.7   Platelets 150 - 400 K/uL 134  112  104        Latest Ref Rng & Units 09/04/2023   10:14 AM 08/07/2023   10:20 AM 06/10/2023    7:55 AM  CMP  Glucose 70 - 99 mg/dL 846  80  85   BUN 8 - 23 mg/dL 29  24  30    Creatinine 0.61 - 1.24 mg/dL 9.62  9.52  8.41   Sodium 135 - 145 mmol/L 140  141  141   Potassium 3.5 - 5.1 mmol/L 4.5  4.4  4.8   Chloride 98 - 111 mmol/L 105  109  108   CO2 22 - 32 mmol/L 32  27  27   Calcium 8.9 - 10.3 mg/dL 9.8  9.1  9.5   Total Protein 6.5 -  8.1 g/dL 7.6  7.4  7.3   Total Bilirubin 0.3 - 1.2 mg/dL 0.7  0.8  1.0   Alkaline Phos 38 - 126 U/L 71  69  83   AST 15 - 41 U/L 24  17  29    ALT 0 - 44 U/L 25  19  22      RADIOGRAPHIC STUDIES: No results found.  ASSESSMENT & PLAN Cody [name] is a 67 y.o. male with medical history significant for CLL who presents for a follow up visit.   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 Discontinued due to poor tolerance with medication.  --Labs show white blood cell 68.2, hemoglobin 10.9, MCV 89.0, and platelets of 134. Creatinine and LFTs normal.  --Currently on Zanubrutinib 160 mg PO BID.  Recommend to continue without any dose modifications.  --Return to clinic in 4 weeks for evaluation on the medication. Will plan to transition to q 3 months thereafter.  #Hypertension -Blood pressure readings have been slightly elevated. Patient reports frequent urination, possibly related to antihypertensive medication (Hydrochlorothiazide). -Advise patient to discuss blood pressure readings with PCP for possible dose adjustment.  #General Health Maintenance -Encourage continued physical activity (treadmill walking).      No orders of the defined types were placed in this encounter.   All questions were  answered. The patient knows to call the clinic with any problems, questions or concerns.  I have spent a total of 30 minutes minutes of face-to-face and non-face-to-face time, preparing to see the patient, performing a medically appropriate examination, counseling and educating the patient, documenting clinical information in the electronic health record,and care coordination.   Georga Kaufmann PA-C Dept of Hematology and Oncology [facility] at [facility] Phone: [phone]     09/04/2023 11:14 AM  Mathis Fare BD, John Giovanni,  Caligaris-Cappio F, Dighiero G, Dhner H, [place], [place], [place] E, Chiorazzi N, Colburn, Rai KR, Olivia, Eichhorst B, O'Brien S, Robak T, Seymour JF,  XB. iwCLL guidelines for diagnosis, indications for treatment, response assessment, and supportive management of CLL. Blood. 2018 Jun 21;131(25):2745-2760.  Active disease should be clearly documented to initiate therapy. At least 1 of the following criteria should be met.  1) Evidence of progressive marrow failure as manifested by the development of, or worsening of, anemia and/or thrombocytopenia. Cutoff levels of Hb <10 g/dL or platelet counts <147  109/L are generally regarded as indication for treatment. However, in some patients, platelet counts <100  109/L may remain stable over a long period; this situation does not automatically require therapeutic intervention. 2) Massive (ie, >=6 cm below the left costal margin) or progressive or symptomatic splenomegaly. 3) Massive nodes (ie, >=10 cm in longest diameter) or progressive or symptomatic lymphadenopathy. 4) Progressive lymphocytosis with an increase of >=50% over a 39-month period, or lymphocyte doubling time (LDT) <6 months. LDT can be obtained by linear regression extrapolation of absolute lymphocyte counts obtained at intervals of 2 weeks over an observation period of 2 to 3 months; patients with initial blood lymphocyte counts <30  109/L may require a longer observation period to determine the LDT. Factors contributing to lymphocytosis other than CLL (eg, infections, steroid administration) should be excluded. 5) Autoimmune complications including anemia or thrombocytopenia poorly responsive to corticosteroids. 6) Symptomatic or functional extranodal involvement (eg, skin, kidney, lung, spine). Disease-related symptoms as defined by any of the following: Unintentional weight loss >=10% within the previous 6 months. Significant fatigue (ie, ECOG performance scale 2 or  worse; cannot work or unable to perform usual activities). Fevers >=100.67F or 38.0C for 2 or more weeks without evidence of infection. Night sweats for >=1 month without evidence of infection.

## 2023-09-07 ENCOUNTER — Ambulatory Visit: Payer: Medicare PPO

## 2023-09-07 DIAGNOSIS — B351 Tinea unguium: Secondary | ICD-10-CM

## 2023-09-07 DIAGNOSIS — E1151 Type 2 diabetes mellitus with diabetic peripheral angiopathy without gangrene: Secondary | ICD-10-CM

## 2023-09-07 DIAGNOSIS — L84 Corns and callosities: Secondary | ICD-10-CM

## 2023-09-07 NOTE — Progress Notes (Signed)
Patient presents to the office today for diabetic shoe and insole measuring.  Patient was measured with brannock device to determine size and width for 1 pair of extra depth shoes and foam casted for 3 pair of insoles.   Documentation of medical necessity will be sent to patient's treating diabetic doctor to verify and sign.   Patient's diabetic provider: Mirna Mires MD   Shoes and insoles will be ordered at that time and patient will be notified for an appointment for fitting when they arrive.   Shoe size (per patient): 15 Brannock measurement: 14.5 Patient shoe selection- Shoe choice:   [id number] and B5000M Shoe size ordered: 65M ABN and financials signed

## 2023-09-30 ENCOUNTER — Observation Stay (HOSPITAL_COMMUNITY)
Admission: EM | Admit: 2023-09-30 | Discharge: 2023-10-02 | Disposition: A | Discharge status: Home / Self Care | Payer: Medicare PPO | Attending: Emergency Medicine | Admitting: Emergency Medicine

## 2023-09-30 ENCOUNTER — Emergency Department (HOSPITAL_COMMUNITY): Payer: Medicare PPO

## 2023-09-30 ENCOUNTER — Encounter (HOSPITAL_COMMUNITY): Payer: Self-pay

## 2023-09-30 DIAGNOSIS — R9089 Other abnormal findings on diagnostic imaging of central nervous system: Secondary | ICD-10-CM | POA: Diagnosis not present

## 2023-09-30 DIAGNOSIS — E785 Hyperlipidemia, unspecified: Secondary | ICD-10-CM

## 2023-09-30 DIAGNOSIS — R531 Weakness: Secondary | ICD-10-CM

## 2023-09-30 DIAGNOSIS — C9110 Chronic lymphocytic leukemia of B-cell type not having achieved remission: Secondary | ICD-10-CM | POA: Diagnosis present

## 2023-09-30 DIAGNOSIS — I639 Cerebral infarction, unspecified: Secondary | ICD-10-CM

## 2023-09-30 DIAGNOSIS — Z79899 Other long term (current) drug therapy: Secondary | ICD-10-CM | POA: Insufficient documentation

## 2023-09-30 DIAGNOSIS — C9111 Chronic lymphocytic leukemia of B-cell type in remission: Secondary | ICD-10-CM | POA: Insufficient documentation

## 2023-09-30 DIAGNOSIS — E119 Type 2 diabetes mellitus without complications: Secondary | ICD-10-CM | POA: Diagnosis not present

## 2023-09-30 DIAGNOSIS — R471 Dysarthria and anarthria: Principal | ICD-10-CM

## 2023-09-30 DIAGNOSIS — R9431 Abnormal electrocardiogram [ECG] [EKG]: Secondary | ICD-10-CM | POA: Diagnosis not present

## 2023-09-30 DIAGNOSIS — Z794 Long term (current) use of insulin: Secondary | ICD-10-CM | POA: Insufficient documentation

## 2023-09-30 DIAGNOSIS — I110 Hypertensive heart disease with heart failure: Secondary | ICD-10-CM | POA: Insufficient documentation

## 2023-09-30 DIAGNOSIS — F1210 Cannabis abuse, uncomplicated: Secondary | ICD-10-CM

## 2023-09-30 DIAGNOSIS — M6281 Muscle weakness (generalized): Secondary | ICD-10-CM | POA: Diagnosis present

## 2023-09-30 DIAGNOSIS — R2681 Unsteadiness on feet: Secondary | ICD-10-CM | POA: Insufficient documentation

## 2023-09-30 DIAGNOSIS — I5020 Unspecified systolic (congestive) heart failure: Secondary | ICD-10-CM | POA: Diagnosis not present

## 2023-09-30 DIAGNOSIS — G459 Transient cerebral ischemic attack, unspecified: Secondary | ICD-10-CM | POA: Diagnosis not present

## 2023-09-30 DIAGNOSIS — I10 Essential (primary) hypertension: Secondary | ICD-10-CM | POA: Diagnosis present

## 2023-09-30 DIAGNOSIS — R4182 Altered mental status, unspecified: Secondary | ICD-10-CM | POA: Diagnosis not present

## 2023-09-30 DIAGNOSIS — M1A9XX0 Chronic gout, unspecified, without tophus (tophi): Secondary | ICD-10-CM | POA: Diagnosis not present

## 2023-09-30 DIAGNOSIS — E1100 Type 2 diabetes mellitus with hyperosmolarity without nonketotic hyperglycemic-hyperosmolar coma (NKHHC): Secondary | ICD-10-CM

## 2023-09-30 DIAGNOSIS — Z7901 Long term (current) use of anticoagulants: Secondary | ICD-10-CM | POA: Diagnosis not present

## 2023-09-30 DIAGNOSIS — E139 Other specified diabetes mellitus without complications: Secondary | ICD-10-CM

## 2023-09-30 DIAGNOSIS — R2689 Other abnormalities of gait and mobility: Secondary | ICD-10-CM | POA: Diagnosis present

## 2023-09-30 HISTORY — DX: Chronic lymphocytic leukemia of B-cell type not having achieved remission: C91.10

## 2023-09-30 LAB — URINALYSIS, ROUTINE W REFLEX MICROSCOPIC
Bacteria, UA: NONE SEEN
Bilirubin Urine: NEGATIVE
Glucose, UA: NEGATIVE mg/dL
Hgb urine dipstick: NEGATIVE
Ketones, ur: NEGATIVE mg/dL
Leukocytes,Ua: NEGATIVE
Nitrite: NEGATIVE
Protein, ur: 30 mg/dL — AB
Specific Gravity, Urine: 1.016 (ref 1.005–1.030)
pH: 5 (ref 5.0–8.0)

## 2023-09-30 LAB — RAPID URINE DRUG SCREEN, HOSP PERFORMED
Amphetamines: NOT DETECTED
Barbiturates: NOT DETECTED
Benzodiazepines: NOT DETECTED
Cocaine: NOT DETECTED
Opiates: NOT DETECTED
Tetrahydrocannabinol: POSITIVE — AB

## 2023-09-30 LAB — COMPREHENSIVE METABOLIC PANEL
ALT: 22 U/L (ref 0–44)
AST: 27 U/L (ref 15–41)
Albumin: 4.2 g/dL (ref 3.5–5.0)
Alkaline Phosphatase: 62 U/L (ref 38–126)
Anion gap: 7 (ref 5–15)
BUN: 27 mg/dL — ABNORMAL HIGH (ref 8–23)
CO2: 26 mmol/L (ref 22–32)
Calcium: 8.8 mg/dL — ABNORMAL LOW (ref 8.9–10.3)
Chloride: 107 mmol/L (ref 98–111)
Creatinine, Ser: 1.1 mg/dL (ref 0.61–1.24)
GFR, Estimated: >60 mL/min (ref >60)
Glucose, Bld: 83 mg/dL (ref 70–99)
Potassium: 4.8 mmol/L (ref 3.5–5.1)
Sodium: 140 mmol/L (ref 135–145)
Total Bilirubin: 1 mg/dL (ref <1.2)
Total Protein: 7.2 g/dL (ref 6.5–8.1)

## 2023-09-30 LAB — DIFFERENTIAL
Abs Immature Granulocytes: 0.23 10*3/uL — ABNORMAL HIGH (ref 0.00–0.07)
Basophils Absolute: 0.1 10*3/uL (ref 0.0–0.1)
Basophils Relative: 0 %
Eosinophils Absolute: 0.1 10*3/uL (ref 0.0–0.5)
Eosinophils Relative: 0 %
Immature Granulocytes: 0 %
Lymphocytes Relative: 89 %
Lymphs Abs: 51.5 10*3/uL — ABNORMAL HIGH (ref 0.7–4.0)
Monocytes Absolute: 2.6 10*3/uL — ABNORMAL HIGH (ref 0.1–1.0)
Monocytes Relative: 5 %
Neutro Abs: 3.6 10*3/uL (ref 1.7–7.7)
Neutrophils Relative %: 6 %

## 2023-09-30 LAB — APTT: aPTT: 26 s (ref 24–36)

## 2023-09-30 LAB — I-STAT CHEM 8, ED
BUN: 28 mg/dL — ABNORMAL HIGH (ref 8–23)
Calcium, Ion: 1.2 mmol/L (ref 1.15–1.40)
Chloride: 104 mmol/L (ref 98–111)
Creatinine, Ser: 1.3 mg/dL — ABNORMAL HIGH (ref 0.61–1.24)
Glucose, Bld: 81 mg/dL (ref 70–99)
HCT: 36 % — ABNORMAL LOW (ref 39.0–52.0)
Hemoglobin: 12.2 g/dL — ABNORMAL LOW (ref 13.0–17.0)
Potassium: 4.6 mmol/L (ref 3.5–5.1)
Sodium: 142 mmol/L (ref 135–145)
TCO2: 29 mmol/L (ref 22–32)

## 2023-09-30 LAB — CBC
HCT: 35.7 % — ABNORMAL LOW (ref 39.0–52.0)
Hemoglobin: 10.4 g/dL — ABNORMAL LOW (ref 13.0–17.0)
MCH: 26 pg (ref 26.0–34.0)
MCHC: 29.1 g/dL — ABNORMAL LOW (ref 30.0–36.0)
MCV: 89.3 fL (ref 80.0–100.0)
Platelets: 108 10*3/uL — ABNORMAL LOW (ref 150–400)
RBC: 4 MIL/uL — ABNORMAL LOW (ref 4.22–5.81)
RDW: 14.9 % (ref 11.5–15.5)
WBC: 58.1 10*3/uL (ref 4.0–10.5)
nRBC: 0 % (ref 0.0–0.2)

## 2023-09-30 LAB — CBG MONITORING, ED: Glucose-Capillary: 100 mg/dL — ABNORMAL HIGH (ref 70–99)

## 2023-09-30 LAB — PROTIME-INR
INR: 1.1 (ref 0.8–1.2)
Prothrombin Time: 14.5 s (ref 11.4–15.2)

## 2023-09-30 LAB — ETHANOL: Alcohol, Ethyl (B): <10 mg/dL (ref <10)

## 2023-09-30 LAB — TROPONIN I (HIGH SENSITIVITY): Troponin I (High Sensitivity): 14 ng/L (ref <18)

## 2023-09-30 LAB — I-STAT CG4 LACTIC ACID, ED: Lactic Acid, Venous: 0.5 mmol/L (ref 0.5–1.9)

## 2023-09-30 MED ORDER — ACETAMINOPHEN 325 MG PO TABS: 650.0000 mg | ORAL_TABLET | Freq: Four times a day (QID) | ORAL | Status: DC | PRN

## 2023-09-30 MED ORDER — CLOPIDOGREL BISULFATE 300 MG PO TABS
300.0000 mg | ORAL_TABLET | ORAL | Status: AC
  Administered 2023-09-30: 300 mg via ORAL
  Filled 2023-09-30: qty 1

## 2023-09-30 MED ORDER — INSULIN ASPART 100 UNIT/ML IJ SOLN
0.0000 [IU] | Freq: Three times a day (TID) | INTRAMUSCULAR | Status: DC
  Filled 2023-09-30: qty 0.09

## 2023-09-30 MED ORDER — ASPIRIN 81 MG PO TBEC
81.0000 mg | DELAYED_RELEASE_TABLET | Freq: Every day | ORAL | Status: DC
  Administered 2023-09-30 – 2023-10-02 (×3): 81 mg via ORAL
  Filled 2023-09-30 (×3): qty 1

## 2023-09-30 MED ORDER — INSULIN ASPART 100 UNIT/ML IJ SOLN
0.0000 [IU] | Freq: Every day | INTRAMUSCULAR | Status: DC
  Filled 2023-09-30: qty 0.05

## 2023-09-30 MED ORDER — ZANUBRUTINIB 80 MG PO CAPS: 160.0000 mg | ORAL_CAPSULE | Freq: Two times a day (BID) | ORAL | Status: DC

## 2023-09-30 MED ORDER — ALLOPURINOL 300 MG PO TABS
300.0000 mg | ORAL_TABLET | Freq: Every day | ORAL | Status: DC
  Administered 2023-10-01 – 2023-10-02 (×2): 300 mg via ORAL
  Filled 2023-09-30 (×2): qty 1

## 2023-09-30 MED ORDER — ATORVASTATIN CALCIUM 10 MG PO TABS
10.0000 mg | ORAL_TABLET | Freq: Every day | ORAL | Status: DC
  Administered 2023-09-30 – 2023-10-01 (×2): 10 mg via ORAL
  Filled 2023-09-30 (×2): qty 1

## 2023-09-30 MED ORDER — ACETAMINOPHEN 650 MG RE SUPP: 650.0000 mg | Freq: Four times a day (QID) | RECTAL | Status: DC | PRN

## 2023-09-30 MED ORDER — CLOPIDOGREL BISULFATE 75 MG PO TABS
75.0000 mg | ORAL_TABLET | Freq: Every day | ORAL | Status: DC
  Administered 2023-10-01 – 2023-10-02 (×2): 75 mg via ORAL
  Filled 2023-09-30 (×2): qty 1

## 2023-09-30 MED ORDER — LORATADINE 10 MG PO TABS
10.0000 mg | ORAL_TABLET | Freq: Every day | ORAL | Status: DC
  Administered 2023-10-01 – 2023-10-02 (×2): 10 mg via ORAL
  Filled 2023-09-30 (×2): qty 1

## 2023-09-30 MED ORDER — STROKE: EARLY STAGES OF RECOVERY BOOK
Freq: Once | Status: AC
  Filled 2023-09-30: qty 1

## 2023-09-30 MED ORDER — ACETAMINOPHEN 160 MG/5ML PO SOLN: 650.0000 mg | Freq: Four times a day (QID) | ORAL | Status: DC | PRN

## 2023-09-30 MED ORDER — ENOXAPARIN SODIUM 40 MG/0.4ML IJ SOSY
40.0000 mg | PREFILLED_SYRINGE | Freq: Every day | INTRAMUSCULAR | Status: DC
Start: 2023-09-30 — End: 2023-09-30

## 2023-09-30 NOTE — ED Notes (Signed)
Pt advised that urine sample is needed for lab.  Urinal at the bedside.

## 2023-09-30 NOTE — ED Provider Notes (Signed)
[place] EMERGENCY DEPARTMENT AT [facility] Provider Note   CSN: [id number] Arrival date & time: 09/30/23  1319     History  Chief Complaint  Patient presents with   Cerebrovascular Accident    Cody [name] is a 67 y.o. male.  HPI Patient with multiple medical issues including CLL for which she is receiving therapy presents with new speech difficulty and right leg discoordination. He is here with his sister who assists with the history. Last seen normal was 9 PM yesterday, before he went to bed.  Today he has had dysarthria, as well as difficulty with walking due to right leg discoordination. Patient self provides his own history, additional details by his sister.     Home Medications Prior to Admission medications   Medication Sig Start Date End Date Taking? Authorizing Provider  allopurinol (ZYLOPRIM) 300 MG tablet TAKE 1 TABLET DAILY Patient taking differently: Take 300 mg by mouth in the morning. 11/27/19   Magrinat, Valentino Hue, MD  amLODipine (NORVASC) 10 MG tablet Take 10 mg by mouth at bedtime.    [provider]  atorvastatin (LIPITOR) 10 MG tablet Take 10 mg by mouth at bedtime.    [provider]  B-D UF III MINI PEN NEEDLES 31G X 5 MM MISC  08/27/20   [provider]  Blood Glucose Monitoring Suppl (ACCU-CHEK GUIDE) w/Device KIT  09/11/20   [provider]  dapagliflozin propanediol (FARXIGA) 10 MG TABS tablet Take 10 mg by mouth daily.    [provider]  fexofenadine (ALLEGRA) 180 MG tablet Take 180 mg by mouth at bedtime.    [provider]  hydrochlorothiazide (HYDRODIURIL) 25 MG tablet Take 25 mg by mouth daily.    [provider]  metFORMIN (GLUCOPHAGE-XR) 500 MG 24 hr tablet Take 1 tablet (500 mg total) by mouth 2 (two) times daily with a meal. Take 500 mg by mouth in the morning and 1,000 mg at bedtime 12/12/22   Rhetta Mura, MD  ondansetron (ZOFRAN) 4 MG tablet TAKE 1  TABLET BY MOUTH EVERY 8 HOURS AS NEEDED FOR NAUSEA AND VOMITING Patient not taking: Reported on 03/19/2023 12/28/19   Magrinat, Valentino Hue, MD  [facility] DELICA LANCETS FINE MISC 3 (three) times daily.  03/28/16   [provider]  telmisartan (MICARDIS) 80 MG tablet Take 80 mg by mouth daily.    [provider]  TOUJEO SOLOSTAR 300 UNIT/ML SOPN Inject 5-30 Units into the skin See admin instructions. Inject 5-15 units into the skin in the morning and 30 units at bedtime, PER SLIDING SCALE (FOR BOTH) 07/04/19   [provider]  zanubrutinib (BRUKINSA) 80 MG capsule Take 2 capsules (160 mg total) by mouth 2 (two) times daily. 09/01/23   Jaci Standard, MD      Allergies    Rituxan [rituximab]    Review of Systems   Review of Systems  Physical Exam Updated Vital Signs BP (!) 163/78   Pulse 63   Temp (!) 97.5 F (36.4 C) (Oral)   Resp 18   Ht 6' (1.829 m)   Wt 88 kg   SpO2 97%   BMI 26.31 kg/m  Physical Exam Vitals and nursing note reviewed.  Constitutional:      General: He is not in acute distress.    Appearance: He is well-developed.  HENT:     Head: Normocephalic and atraumatic.   Eyes:     Conjunctiva/sclera: Conjunctivae normal.  Cardiovascular:  Rate and Rhythm: Normal rate and regular rhythm.  Pulmonary:     Effort: Pulmonary effort is normal. No respiratory distress.     Breath sounds: No stridor.  Abdominal:     General: There is no distension.  Skin:    General: Skin is warm and dry.  Neurological:     Mental Status: He is alert and oriented to person, place, and time.     Comments: NIH 3.  Dysarthria, right leg drift, when held against gravity otherwise strength is symmetric in 4 extremities, patient follows commands though there is some delay in finger-nose testing, mild dysmetria.      ED Results / Procedures / Treatments   Labs (all labs ordered are listed, but only abnormal results are displayed) Labs Reviewed  CBC - Abnormal;  Notable for the following components:      Result Value   WBC 58.1 (*)    RBC 4.00 (*)    Hemoglobin 10.4 (*)    HCT 35.7 (*)    MCHC 29.1 (*)    Platelets 108 (*)    All other components within normal limits  COMPREHENSIVE METABOLIC PANEL - Abnormal; Notable for the following components:   BUN 27 (*)    Calcium 8.8 (*)    All other components within normal limits  I-STAT CHEM 8, ED - Abnormal; Notable for the following components:   BUN 28 (*)    Creatinine, Ser 1.30 (*)    Hemoglobin 12.2 (*)    HCT 36.0 (*)    All other components within normal limits  PROTIME-INR  APTT  ETHANOL  DIFFERENTIAL  RAPID URINE DRUG SCREEN, HOSP PERFORMED  URINALYSIS, ROUTINE W REFLEX MICROSCOPIC  I-STAT CG4 LACTIC ACID, ED    EKG EKG Interpretation Date/Time:  Wednesday September 30 2023 13:26:58 EST Ventricular Rate:  75 PR Interval:  246 QRS Duration:  106 QT Interval:  402 QTC Calculation: 449 R Axis:   50  Text Interpretation: Sinus rhythm Prolonged PR interval Probable LVH with secondary repol abnrm Artifact Confirmed by Gerhard Munch [phone]) on 09/30/2023 2:06:31 PM  Radiology CT HEAD WO CONTRAST  Result Date: 09/30/2023 CLINICAL DATA:  Mental status change, unknown cause, stroke suspected EXAM: CT HEAD WITHOUT CONTRAST TECHNIQUE: Contiguous axial images were obtained from the base of the skull through the vertex without intravenous contrast. RADIATION DOSE REDUCTION: This exam was performed according to the departmental dose-optimization program which includes automated exposure control, adjustment of the mA and/or kV according to patient size and/or use of iterative reconstruction technique. COMPARISON:  None Available. FINDINGS: Evaluation is somewhat limited by motion artifact. Brain: No evidence of acute infarction, hemorrhage, mass, mass effect, or midline shift. No hydrocephalus or extra-axial collection. Vascular: No hyperdense vessel. Skull: Negative for fracture or focal  lesion. Sinuses/Orbits: Mucosal thickening in the ethmoid air cells. Mucous retention cyst in the left frontal sinus. Other: The mastoid air cells are well aerated. ASPECTS [facility] Stroke Program Early CT Score) - Ganglionic level infarction (caudate, lentiform nuclei, internal capsule, insula, M1-M3 cortex): 7 - Supraganglionic infarction (M4-M6 cortex): 3 Total score (0-10 with 10 being normal): 10 IMPRESSION: No acute intracranial process. ASPECTS is 10. Code stroke imaging results were communicated on 09/30/2023 at 4:33 pm to provider Jupiter Boys via telephone, who verbally acknowledged these results. Electronically Signed   By: Wiliam Ke M.D.   On: 09/30/2023 16:41    Procedures Procedures    Medications Ordered in ED Medications - No data to display  ED Course/  Medical Decision Making/ A&P                                 Medical Decision Making Patient with substantial risk profile for CVA, including hypertension, diabetes, cancer now presents with speech difficulty, discoordination since yesterday.  Patient is out of the window for TNK, but given dysarthria, weakness I discussed his case with our neurology colleagues soon after my evaluation, and he had expeditious teleneurology evaluation. Patient's vital signs generally reassuring cardiac 70 sinus normal Pulse ox 96% room air normal Labs sent, CT ordered.  Amount and/or Complexity of Data Reviewed Independent Historian:     Details: Sister at bedside External Data Reviewed:     Details: CLL clinic note Labs: ordered. Decision-making details documented in ED Course. Radiology: ordered and independent interpretation performed. Decision-making details documented in ED Course. ECG/medicine tests: ordered and independent interpretation performed. Decision-making details documented in ED Course.  Risk Prescription drug management. Decision regarding hospitalization. Diagnosis or treatment significantly limited by social  determinants of health.   5:08 PM I discussed the patient's case with our radiology colleagues, and as above his management thus far has been with neurology.  Patient awaiting MRI results.  Initial CT scan without abnormal findings, but with concern for CVA, particular given the patient's CLL, elevated risk profile, likely admission.  On signout patient awaiting MRI, neuro discussion.        Final Clinical Impression(s) / ED Diagnoses Final diagnoses:  Dysarthria  Right sided weakness      Gerhard Munch, MD 09/30/23 1709

## 2023-09-30 NOTE — Progress Notes (Signed)
Telestroke cart was activated at 1326. Per treatment team, pt's LKW was at 2130 last night with c/o ataxia, R leg weakness, and dysarthria. Dr. Jeraldine Loots, EDP at bedside assessing patient prior to cart activation. TSMD was paged for code stroke at 1327. Dr. Amada Jupiter, TSMD appeared on telestroke cart at 1329 to assess the patient. Based on TSMD's assessment, pt does not meet criteria for emergent interventions at this time 2/2 outside of window. TSMD to f/u with EDP regarding recommendations. No further needs from Telestroke RN at this time. Telestroke cart disconnected at 1345. Pt was transported to and from CT after cart was disconnected.   mRS- 3

## 2023-09-30 NOTE — ED Notes (Signed)
Pt given ham sandwich with cheese and ice water.  Sister remains at the bedside.

## 2023-09-30 NOTE — Consult Note (Signed)
[facility] Telemedicine Consult   Requesting Provider: Gerhard Munch Consult Participants: Bedside nursing, sister Location of the provider: [facility] Location of the patient: [facility]  This consult was provided via telemedicine with 2-way video and audio communication. The patient/family was informed that care would be provided in this way and agreed to receive care in this manner.    Chief Complaint: gait change  HPI: 67 yo M LKW at 9:30 pm who presents with difficulty walking as present on awakening.  He has some cognitive impairment and therefore his sister has cameras and noticed that he was unsteady this morning and seemed to be dragging his right leg.  She also felt like he was slurring his speech.  She therefore brought him into the emergency department to be evaluated at [facility].   He has some cognitive slowing, and the patient says that sometimes he has difficulty walking, but his sister states that she has never seen him do this before.   LKW: 9:30 PM. tpa given?: No, outside of window IR Thrombectomy? No, no clinical findings of LVO Modified Rankin Scale: 3-Moderate disability-requires help but walks WITHOUT assistance Time of teleneurologist evaluation: 13:29  Exam: There were no vitals filed for this visit.  General: in bed, NAD  1A: Level of Consciousness - 0 1B: Ask Month and Age - 2 1C: 'Blink Eyes' & 'Squeeze Hands' - 0 2: Test Horizontal Extraocular Movements - 0 3: Test Visual Fields - 0 4: Test Facial Palsy - 0 5A: Test Left Arm Motor Drift - 0 5B: Test Right Arm Motor Drift - 0 6A: Test Left Leg Motor Drift - 0 6B: Test Right Leg Motor Drift - 1 7: Test Limb Ataxia - 0(slow but without dysmetria) 8: Test Sensation - 0 9: Test Language/Aphasia- 0(slow speech, naming intact. Repetition intact. suspect baseline) 10: Test Dysarthria - 1 11: Test Extinction/Inattention - 0 NIHSS score: 4   Imaging Reviewed: CT head - negative  Labs reviewed  in epic and pertinent values follow: Cr 1.3   Assessment: 67 year old male with acute gait dysfunction and right leg weakness in addition to dysarthria that was present on awakening.  He is outside the TNK window and has mild symptoms.  He will need further evaluation as I do suspect stroke is the most likely etiology given the combination of both leg weakness as well as dysarthria.  Recommendations:  - HgbA1c, fasting lipid panel - MRI of the brain without contrast - Frequent neuro checks - Echocardiogram - MRA head and neck - Prophylactic therapy-Antiplatelet med: Aspirin - dose 81mg  and plavix 75mg  daily  after 300mg  load  - Risk factor modification - Telemetry monitoring - PT consult, OT consult, Speech consult - Stroke team to follow   Ritta Slot, MD [facility] [phone]  If 7pm- 7am, please page neurology on call as listed in AMION.

## 2023-09-30 NOTE — H&P (Signed)
History and Physical    Cody HOUSMAN [name]:[id number] DOB: October 28, 1956 DOA: 09/30/2023  PCP: Mirna Mires, MD  Patient coming from: Home  Chief Complaint: Code stroke  HPI: Cody [name] is a 67 y.o. male with medical history significant of CLL, type 2 diabetes, hypertension, hyperlipidemia, history of alcohol abuse in remission, alcoholic cirrhosis, gout presented to the ED as code stroke for evaluation of right leg weakness and dysarthria.  Last known well at 9:30 PM yesterday.  Out of tPA window at the time of arrival and had mild symptoms.  Vital signs on arrival: Temperature 98.7 F, pulse 71, respiratory rate 15, blood pressure 158/76, and SpO2 96% on room air.  Labs notable for WBC count 58.1 with absolute lymphocyte count 51.5, hemoglobin 10.4, platelet count 108k, ethanol level undetectable, lactic acid normal, UDS positive for THC.  CT head negative for acute intracranial abnormality.  Brain MRI negative for acute intracranial abnormality.  MRA head and neck normal. Neurology recommended admission to [facility] for TIA workup.  [facility] called to admit.  Patient is a poor historian.  History provided mostly by his sister at bedside who states that the patient stays at home with his mother.  Sister does not stay in the same house but there are cameras in the patient's house for monitoring.  Sister states yesterday night patient went to bed around 9 PM and at that time he appeared normal.  This morning she saw him on the camera at around 7 AM and had noticed that he had urinated while sitting in a chair and when he tried to get up to walk, he was dragging his right leg.  Also his speech was slurred.  Symptoms resolved after patient was brought into the ED.  He does not have history of previous stroke.  Sister states about a month ago patient had complained of chest pain and was seen by his oncologist and they thought maybe this was related to his cancer treatment.  Patient  reports 1 episode of chest pain a month ago at night.  He denies any recurrence of chest pain since then.  Denies any exertional chest pain or dyspnea.  Denies fevers or cough.  Denies nausea, vomiting, or abdominal pain.  No other complaints.  Review of Systems:  Review of Systems  All other systems reviewed and are negative.   Past Medical History:  Diagnosis Date   Alcoholic cirrhosis (HCC)    Cataract    Chronic lymphoid leukemia (HCC)    Diabetes mellitus without complication (HCC)    High blood pressure 10/24/2004   History of alcohol abuse    Hypercholesterolemia     History reviewed. No pertinent surgical history.   reports that he has never smoked. He has never used smokeless tobacco. He reports that he does not currently use alcohol. He reports that he does not use drugs.  Allergies  Allergen Reactions   Rituxan [Rituximab] Other (See Comments)    "Shaking all over and diaphoretic"    Family History  Problem Relation Age of Onset   Alcohol abuse Father    Cirrhosis Father    Coronary artery disease Father     Prior to Admission medications   Medication Sig Start Date End Date Taking? Authorizing Provider  allopurinol (ZYLOPRIM) 300 MG tablet TAKE 1 TABLET DAILY Patient taking differently: Take 300 mg by mouth in the morning. 11/27/19  Yes Magrinat, Valentino Hue, MD  amLODipine (NORVASC) 5 MG tablet Take 5 mg by  mouth at bedtime.   Yes [provider]  atorvastatin (LIPITOR) 10 MG tablet Take 10 mg by mouth at bedtime.   Yes [provider]  dapagliflozin propanediol (FARXIGA) 10 MG TABS tablet Take 10 mg by mouth daily.   Yes [provider]  fexofenadine (ALLEGRA) 180 MG tablet Take 180 mg by mouth at bedtime.   Yes [provider]  hydrochlorothiazide (HYDRODIURIL) 25 MG tablet Take 25 mg by mouth daily.   Yes [provider]  metFORMIN (GLUCOPHAGE-XR) 500 MG 24 hr tablet Take 1 tablet (500 mg total) by mouth 2 (two) times  daily with a meal. Take 500 mg by mouth in the morning and 1,000 mg at bedtime Patient taking differently: Take 500-1,000 mg by mouth See admin instructions. Take 500 mg by mouth in the morning and 1,000 mg at bedtime 12/12/22  Yes Rhetta Mura, MD  telmisartan (MICARDIS) 80 MG tablet Take 80 mg by mouth daily.   Yes [provider]  TOUJEO SOLOSTAR 300 UNIT/ML SOPN Inject 0-8 Units into the skin See admin instructions. Inject 0-8 units into the skin in the morning and at bedtime, PER SLIDING SCALE: BGL: 0-99 = give nothing; 100-150 = 4 units; 151-200 = 6 units; 201-250 = 8 units 07/04/19  Yes [provider]  zanubrutinib (BRUKINSA) 80 MG capsule Take 2 capsules (160 mg total) by mouth 2 (two) times daily. 09/01/23  Yes Jaci Standard, MD  B-D UF III MINI PEN NEEDLES 31G X 5 MM MISC  08/27/20   [provider]  Blood Glucose Monitoring Suppl (ACCU-CHEK GUIDE) w/Device KIT  09/11/20   [provider]  ondansetron (ZOFRAN) 4 MG tablet TAKE 1 TABLET BY MOUTH EVERY 8 HOURS AS NEEDED FOR NAUSEA AND VOMITING Patient not taking: Reported on 09/30/2023 12/28/19   Magrinat, Valentino Hue, MD  [facility] DELICA LANCETS FINE MISC 3 (three) times daily.  03/28/16   [provider]    Physical Exam: Vitals:   09/30/23 1351 09/30/23 1530 09/30/23 1615 09/30/23 1925  BP:  (!) 163/78 (!) 154/74 (!) 171/89  Pulse:  63 (!) 58 70  Resp:  18 15 (!) 25  Temp:  (!) 97.5 F (36.4 C)  98 F (36.7 C)  TempSrc:  Oral  Oral  SpO2:  97% 96% 97%  Weight: 88 kg     Height: 6' (1.829 m)       Physical Exam Vitals reviewed.  Constitutional:      General: He is not in acute distress. HENT:     Head: Normocephalic and atraumatic.  Eyes:     Extraocular Movements: Extraocular movements intact.  Cardiovascular:     Rate and Rhythm: Normal rate and regular rhythm.     Pulses: Normal pulses.  Pulmonary:     Effort: Pulmonary effort is normal. No respiratory distress.      Breath sounds: Normal breath sounds. No wheezing or rales.  Abdominal:     General: Bowel sounds are normal. There is no distension.     Palpations: Abdomen is soft.     Tenderness: There is no abdominal tenderness. There is no guarding.  Musculoskeletal:     Cervical back: Normal range of motion.     Right lower leg: No edema.     Left lower leg: No edema.  Skin:    General: Skin is warm and dry.  Neurological:     General: No focal deficit present.     Mental Status: He is  alert and oriented to person, place, and time.     Cranial Nerves: No cranial nerve deficit.     Sensory: No sensory deficit.     Motor: No weakness.     Labs on Admission: I have personally reviewed following labs and imaging studies  CBC: Recent Labs  Lab 09/30/23 1410 09/30/23 1429  WBC 58.1*  --   NEUTROABS 3.6  --   HGB 10.4* 12.2*  HCT 35.7* 36.0*  MCV 89.3  --   PLT 108*  --    Basic Metabolic Panel: Recent Labs  Lab 09/30/23 1410 09/30/23 1429  NA 140 142  K 4.8 4.6  CL 107 104  CO2 26  --   GLUCOSE 83 81  BUN 27* 28*  CREATININE 1.10 1.30*  CALCIUM 8.8*  --    GFR: Estimated Creatinine Clearance: 59.7 mL/min (A) (by C-G formula based on SCr of 1.3 mg/dL (H)). Liver Function Tests: Recent Labs  Lab 09/30/23 1410  AST 27  ALT 22  ALKPHOS 62  BILITOT 1.0  PROT 7.2  ALBUMIN 4.2   No results for input(s): "LIPASE", "AMYLASE" in the last 168 hours. No results for input(s): "AMMONIA" in the last 168 hours. Coagulation Profile: Recent Labs  Lab 09/30/23 1410  INR 1.1   Cardiac Enzymes: No results for input(s): "CKTOTAL", "CKMB", "CKMBINDEX", "TROPONINI" in the last 168 hours. BNP (last 3 results) No results for input(s): "PROBNP" in the last 8760 hours. HbA1C: No results for input(s): "HGBA1C" in the last 72 hours. CBG: No results for input(s): "GLUCAP" in the last 168 hours. Lipid Profile: No results for input(s): "CHOL", "HDL", "LDLCALC", "TRIG", "CHOLHDL",  "LDLDIRECT" in the last 72 hours. Thyroid Function Tests: No results for input(s): "TSH", "T4TOTAL", "FREET4", "T3FREE", "THYROIDAB" in the last 72 hours. Anemia Panel: No results for input(s): "VITAMINB12", "FOLATE", "FERRITIN", "TIBC", "IRON", "RETICCTPCT" in the last 72 hours. Urine analysis:    Component Value Date/Time   COLORURINE YELLOW 12/04/2022 1449   APPEARANCEUR HAZY (A) 12/04/2022 1449   LABSPEC 1.013 12/04/2022 1449   PHURINE 5.0 12/04/2022 1449   GLUCOSEU NEGATIVE 12/04/2022 1449   HGBUR SMALL (A) 12/04/2022 1449   BILIRUBINUR NEGATIVE 12/04/2022 1449   KETONESUR NEGATIVE 12/04/2022 1449   PROTEINUR 30 (A) 12/04/2022 1449   NITRITE NEGATIVE 12/04/2022 1449   LEUKOCYTESUR NEGATIVE 12/04/2022 1449    Radiological Exams on Admission: MR BRAIN WO CONTRAST  Result Date: 09/30/2023 CLINICAL DATA:  Altered mental status EXAM: MRI HEAD WITHOUT CONTRAST MRA HEAD WITHOUT CONTRAST MRA NECK WITHOUT CONTRAST TECHNIQUE: Multiplanar, multiecho pulse sequences of the brain and surrounding structures were obtained without intravenous contrast. Angiographic images of the Circle of Willis were obtained using MRA technique without intravenous contrast. Angiographic images of the neck were obtained using MRA technique without intravenous contrast. Carotid stenosis measurements (when applicable) are obtained utilizing NASCET criteria, using the distal internal carotid diameter as the denominator. COMPARISON:  None Available. FINDINGS: MRI HEAD FINDINGS Brain: No acute infarct, mass effect or extra-axial collection. No acute or chronic hemorrhage. Mild volume loss. The midline structures are normal. Vascular: Normal flow voids. Skull and upper cervical spine: Normal calvarium and skull base. Visualized upper cervical spine and soft tissues are normal. Sinuses/Orbits:Left maxillary sinus retention cyst.  Normal orbits. MRA HEAD FINDINGS POSTERIOR CIRCULATION: --Vertebral arteries: Normal --Inferior  cerebellar arteries: Normal. --Basilar artery: Normal. --Superior cerebellar arteries: Normal. --Posterior cerebral arteries: Normal. Both posterior communicating arteries are patent. ANTERIOR CIRCULATION: --Intracranial internal carotid arteries: Normal. --Anterior cerebral arteries (  ACA): Normal. --Middle cerebral arteries (MCA): Normal. MRA NECK FINDINGS No dissection, occlusion or hemodynamically significant stenosis of the carotid or vertebral arteries. The vertebral system is mildly right dominant. IMPRESSION: 1. No acute intracranial abnormality. 2. Normal intracranial MRA. 3. Normal MRA of the neck. Electronically Signed   By: Deatra Robinson M.D.   On: 09/30/2023 19:35   MR ANGIO HEAD WO CONTRAST  Result Date: 09/30/2023 CLINICAL DATA:  Altered mental status EXAM: MRI HEAD WITHOUT CONTRAST MRA HEAD WITHOUT CONTRAST MRA NECK WITHOUT CONTRAST TECHNIQUE: Multiplanar, multiecho pulse sequences of the brain and surrounding structures were obtained without intravenous contrast. Angiographic images of the Circle of Willis were obtained using MRA technique without intravenous contrast. Angiographic images of the neck were obtained using MRA technique without intravenous contrast. Carotid stenosis measurements (when applicable) are obtained utilizing NASCET criteria, using the distal internal carotid diameter as the denominator. COMPARISON:  None Available. FINDINGS: MRI HEAD FINDINGS Brain: No acute infarct, mass effect or extra-axial collection. No acute or chronic hemorrhage. Mild volume loss. The midline structures are normal. Vascular: Normal flow voids. Skull and upper cervical spine: Normal calvarium and skull base. Visualized upper cervical spine and soft tissues are normal. Sinuses/Orbits:Left maxillary sinus retention cyst.  Normal orbits. MRA HEAD FINDINGS POSTERIOR CIRCULATION: --Vertebral arteries: Normal --Inferior cerebellar arteries: Normal. --Basilar artery: Normal. --Superior cerebellar arteries:  Normal. --Posterior cerebral arteries: Normal. Both posterior communicating arteries are patent. ANTERIOR CIRCULATION: --Intracranial internal carotid arteries: Normal. --Anterior cerebral arteries (ACA): Normal. --Middle cerebral arteries (MCA): Normal. MRA NECK FINDINGS No dissection, occlusion or hemodynamically significant stenosis of the carotid or vertebral arteries. The vertebral system is mildly right dominant. IMPRESSION: 1. No acute intracranial abnormality. 2. Normal intracranial MRA. 3. Normal MRA of the neck. Electronically Signed   By: Deatra Robinson M.D.   On: 09/30/2023 19:35   MR ANGIO NECK WO CONTRAST  Result Date: 09/30/2023 CLINICAL DATA:  Altered mental status EXAM: MRI HEAD WITHOUT CONTRAST MRA HEAD WITHOUT CONTRAST MRA NECK WITHOUT CONTRAST TECHNIQUE: Multiplanar, multiecho pulse sequences of the brain and surrounding structures were obtained without intravenous contrast. Angiographic images of the Circle of Willis were obtained using MRA technique without intravenous contrast. Angiographic images of the neck were obtained using MRA technique without intravenous contrast. Carotid stenosis measurements (when applicable) are obtained utilizing NASCET criteria, using the distal internal carotid diameter as the denominator. COMPARISON:  None Available. FINDINGS: MRI HEAD FINDINGS Brain: No acute infarct, mass effect or extra-axial collection. No acute or chronic hemorrhage. Mild volume loss. The midline structures are normal. Vascular: Normal flow voids. Skull and upper cervical spine: Normal calvarium and skull base. Visualized upper cervical spine and soft tissues are normal. Sinuses/Orbits:Left maxillary sinus retention cyst.  Normal orbits. MRA HEAD FINDINGS POSTERIOR CIRCULATION: --Vertebral arteries: Normal --Inferior cerebellar arteries: Normal. --Basilar artery: Normal. --Superior cerebellar arteries: Normal. --Posterior cerebral arteries: Normal. Both posterior communicating arteries  are patent. ANTERIOR CIRCULATION: --Intracranial internal carotid arteries: Normal. --Anterior cerebral arteries (ACA): Normal. --Middle cerebral arteries (MCA): Normal. MRA NECK FINDINGS No dissection, occlusion or hemodynamically significant stenosis of the carotid or vertebral arteries. The vertebral system is mildly right dominant. IMPRESSION: 1. No acute intracranial abnormality. 2. Normal intracranial MRA. 3. Normal MRA of the neck. Electronically Signed   By: Deatra Robinson M.D.   On: 09/30/2023 19:35   CT HEAD WO CONTRAST  Result Date: 09/30/2023 CLINICAL DATA:  Mental status change, unknown cause, stroke suspected EXAM: CT HEAD WITHOUT CONTRAST TECHNIQUE: Contiguous axial images were obtained from  the base of the skull through the vertex without intravenous contrast. RADIATION DOSE REDUCTION: This exam was performed according to the departmental dose-optimization program which includes automated exposure control, adjustment of the mA and/or kV according to patient size and/or use of iterative reconstruction technique. COMPARISON:  None Available. FINDINGS: Evaluation is somewhat limited by motion artifact. Brain: No evidence of acute infarction, hemorrhage, mass, mass effect, or midline shift. No hydrocephalus or extra-axial collection. Vascular: No hyperdense vessel. Skull: Negative for fracture or focal lesion. Sinuses/Orbits: Mucosal thickening in the ethmoid air cells. Mucous retention cyst in the left frontal sinus. Other: The mastoid air cells are well aerated. ASPECTS [facility] Stroke Program Early CT Score) - Ganglionic level infarction (caudate, lentiform nuclei, internal capsule, insula, M1-M3 cortex): 7 - Supraganglionic infarction (M4-M6 cortex): 3 Total score (0-10 with 10 being normal): 10 IMPRESSION: No acute intracranial process. ASPECTS is 10. Code stroke imaging results were communicated on 09/30/2023 at 4:33 pm to provider LOCKWOOD via telephone, who verbally acknowledged these results.  Electronically Signed   By: Wiliam Ke M.D.   On: 09/30/2023 16:41    EKG: Independently reviewed.  Sinus rhythm with first-degree AV block, baseline wander, T wave inversions inferolaterally new compared to previous EKG from January 2024.  Assessment and Plan  TIA Patient presenting with right leg weakness and dysarthria.  Out of tPA window at the time of arrival.  Symptoms have now resolved. CT head negative for acute intracranial abnormality.  Brain MRI negative for acute intracranial abnormality.  MRA head and neck normal. Neurology recommended admission to [facility] for TIA workup. -Appreciate neurology recommendations -Neurology recommending starting aspirin 81 mg daily and Plavix 75 mg daily after 300 mg Plavix load -Telemetry monitoring -Echocardiogram -Hemoglobin A1c, fasting lipid panel -Frequent neurochecks -PT, OT, speech therapy -Risk factor modification -N.p.o. until cleared by bedside swallow evaluation or formal speech evaluation  Abnormal EKG EKG showing T wave inversions inferolaterally which appear new compared to previous tracing from January 2024.  Patient is reporting an episode of nonexertional chest pain a month ago but no recurrence since then.  Denies any exertional chest pain or dyspnea.  Check troponin x 2.  Echocardiogram ordered.  Marijuana abuse UDS positive for THC.  Counsel to quit.  CLL Previously treated with rituximab which was discontinued due to poor tolerance.  Currently on Zanubrutinib. WBC count 58.1 (improved).  Hemoglobin and platelet count stable.  Outpatient oncology follow-up.  Type 2 diabetes Last A1c 6.2 in January 2024, repeat ordered.  Sensitive sliding scale insulin.  Hypertension Allow permissive hypertension at this time.  Resume home meds when okay with neurology.  Hyperlipidemia Continue Lipitor.  Chronic gout Continue allopurinol.  DVT prophylaxis: Lovenox Code Status: Full Code (discussed with the patient  and his sister) Family Communication: Sister at bedside. Consults called: Neurology Level of care: Telemetry bed Admission status: It is my clinical opinion that referral for OBSERVATION is reasonable and necessary in this patient based on the above information provided. The aforementioned taken together are felt to place the patient at high risk for further clinical deterioration. However, it is anticipated that the patient may be medically stable for discharge from the hospital within 24 to 48 hours.  John Giovanni MD [facility]  If 7PM-7AM, please contact night-coverage [email or web address]  09/30/2023, 8:04 PM

## 2023-09-30 NOTE — ED Provider Notes (Signed)
4:57 PM Care assumed from Dr. Jeraldine Loots.  At time of transfer of care, patient is awaiting for results of MRI and then touch base with neurology to see where patient is admitted for suspected stroke either here or at [facility].   7:31 PM Neurology reviewed the images and do not see stroke.  I reassessed the patient with neurology on speaker phone and patient symptoms have resolved.  They are concerned about TIA and feel he needs admission to [facility] for stroke workup.   Clinical Impression: 1. Dysarthria   2. Right sided weakness   3. TIA (transient ischemic attack)     Disposition: Admit  This note was prepared with assistance of Dragon voice recognition software. Occasional wrong-word or sound-a-like substitutions may have occurred due to the inherent limitations of voice recognition software.      Cody [name], Canary Brim, MD 09/30/23 [phone]

## 2023-09-30 NOTE — ED Triage Notes (Signed)
Pt presents after waking up with gait difficulty and slurred speech.  LKW 2100 last night.  Pt reports no complaints.    Pt's sister reports Pt was incontinent of urine last night which is abnormal.

## 2023-09-30 NOTE — ED Notes (Signed)
Pt given meal tray, his sister is helping him eat.  No distress.  Per sister his speech is at baseline.

## 2023-09-30 NOTE — ED Notes (Signed)
CRITICAL VALUE STICKER  CRITICAL VALUE: WBC 58.1  RECEIVER (on-site recipient of call):  DATE & TIME NOTIFIED: 09/30/2023 4:06 PM     MD NOTIFIED: Jeraldine Loots MD  TIME OF NOTIFICATION: 4:07 PM   RESPONSE:

## 2023-10-01 ENCOUNTER — Inpatient Hospital Stay: Payer: Medicare PPO

## 2023-10-01 ENCOUNTER — Observation Stay (HOSPITAL_COMMUNITY): Payer: Medicare PPO

## 2023-10-01 ENCOUNTER — Inpatient Hospital Stay: Payer: Medicare PPO | Admitting: Hematology and Oncology

## 2023-10-01 ENCOUNTER — Other Ambulatory Visit: Payer: Self-pay

## 2023-10-01 DIAGNOSIS — Z794 Long term (current) use of insulin: Secondary | ICD-10-CM | POA: Diagnosis not present

## 2023-10-01 DIAGNOSIS — M6281 Muscle weakness (generalized): Secondary | ICD-10-CM | POA: Diagnosis not present

## 2023-10-01 DIAGNOSIS — R531 Weakness: Secondary | ICD-10-CM

## 2023-10-01 DIAGNOSIS — E785 Hyperlipidemia, unspecified: Secondary | ICD-10-CM | POA: Diagnosis not present

## 2023-10-01 DIAGNOSIS — R471 Dysarthria and anarthria: Secondary | ICD-10-CM

## 2023-10-01 DIAGNOSIS — I110 Hypertensive heart disease with heart failure: Secondary | ICD-10-CM | POA: Diagnosis not present

## 2023-10-01 DIAGNOSIS — I5020 Unspecified systolic (congestive) heart failure: Secondary | ICD-10-CM | POA: Diagnosis not present

## 2023-10-01 DIAGNOSIS — E119 Type 2 diabetes mellitus without complications: Secondary | ICD-10-CM | POA: Diagnosis not present

## 2023-10-01 DIAGNOSIS — G459 Transient cerebral ischemic attack, unspecified: Secondary | ICD-10-CM

## 2023-10-01 DIAGNOSIS — R2681 Unsteadiness on feet: Secondary | ICD-10-CM | POA: Diagnosis not present

## 2023-10-01 DIAGNOSIS — F1210 Cannabis abuse, uncomplicated: Secondary | ICD-10-CM | POA: Diagnosis not present

## 2023-10-01 LAB — CBC WITH DIFFERENTIAL/PLATELET
Abs Immature Granulocytes: 0.21 10*3/uL — ABNORMAL HIGH (ref 0.00–0.07)
Basophils Absolute: 0.1 10*3/uL (ref 0.0–0.1)
Basophils Relative: 0 %
Eosinophils Absolute: 0.2 10*3/uL (ref 0.0–0.5)
Eosinophils Relative: 0 %
HCT: 39.7 % (ref 39.0–52.0)
Hemoglobin: 11.7 g/dL — ABNORMAL LOW (ref 13.0–17.0)
Immature Granulocytes: 0 %
Lymphocytes Relative: 90 %
Lymphs Abs: 52.3 10*3/uL — ABNORMAL HIGH (ref 0.7–4.0)
MCH: 26.2 pg (ref 26.0–34.0)
MCHC: 29.5 g/dL — ABNORMAL LOW (ref 30.0–36.0)
MCV: 88.8 fL (ref 80.0–100.0)
Monocytes Absolute: 2 10*3/uL — ABNORMAL HIGH (ref 0.1–1.0)
Monocytes Relative: 4 %
Neutro Abs: 3.5 10*3/uL (ref 1.7–7.7)
Neutrophils Relative %: 6 %
Platelets: 114 10*3/uL — ABNORMAL LOW (ref 150–400)
RBC: 4.47 MIL/uL (ref 4.22–5.81)
RDW: 14.7 % (ref 11.5–15.5)
WBC: 58.4 10*3/uL (ref 4.0–10.5)
nRBC: 0 % (ref 0.0–0.2)

## 2023-10-01 LAB — ECHOCARDIOGRAM COMPLETE
AR max vel: 2.9 cm2
AV Area VTI: 2.52 cm2
AV Area mean vel: 2.38 cm2
AV Mean grad: 3 mm[Hg]
AV Peak grad: 5.6 mm[Hg]
Ao pk vel: 1.18 m/s
Area-P 1/2: 2.6 cm2
Calc EF: 43.2 %
Height: 72 in
S' Lateral: 4.2 cm
Single Plane A2C EF: 42.4 %
Single Plane A4C EF: 41.5 %
Weight: 3104 [oz_av]

## 2023-10-01 LAB — GLUCOSE, CAPILLARY
Glucose-Capillary: 114 mg/dL — ABNORMAL HIGH (ref 70–99)
Glucose-Capillary: 102 mg/dL — ABNORMAL HIGH (ref 70–99)

## 2023-10-01 LAB — LIPID PANEL
Cholesterol: 158 mg/dL (ref 0–200)
HDL: 36 mg/dL — ABNORMAL LOW (ref >40)
LDL Cholesterol: 94 mg/dL (ref 0–99)
Total CHOL/HDL Ratio: 4.4 {ratio}
Triglycerides: 141 mg/dL (ref <150)
VLDL: 28 mg/dL (ref 0–40)

## 2023-10-01 LAB — BASIC METABOLIC PANEL
Anion gap: 8 (ref 5–15)
BUN: 28 mg/dL — ABNORMAL HIGH (ref 8–23)
CO2: 25 mmol/L (ref 22–32)
Calcium: 8.8 mg/dL — ABNORMAL LOW (ref 8.9–10.3)
Chloride: 104 mmol/L (ref 98–111)
Creatinine, Ser: 1.1 mg/dL (ref 0.61–1.24)
GFR, Estimated: >60 mL/min (ref >60)
Glucose, Bld: 103 mg/dL — ABNORMAL HIGH (ref 70–99)
Potassium: 4.6 mmol/L (ref 3.5–5.1)
Sodium: 137 mmol/L (ref 135–145)

## 2023-10-01 LAB — CBG MONITORING, ED
Glucose-Capillary: 80 mg/dL (ref 70–99)
Glucose-Capillary: 108 mg/dL — ABNORMAL HIGH (ref 70–99)

## 2023-10-01 LAB — HEMOGLOBIN A1C
Hgb A1c MFr Bld: 5.9 % — ABNORMAL HIGH (ref 4.8–5.6)
Mean Plasma Glucose: 122.63 mg/dL

## 2023-10-01 NOTE — Progress Notes (Signed)
Prior-To-Admission Oral Chemotherapy for Treatment of Oncologic Disease   Order noted from Dr. Loney Loh to continue prior-to-admission oral chemotherapy regimen of Brukinsa.  Procedure Per Pharmacy & Therapeutics Committee Policy: Orders for continuation of home oral chemotherapy for treatment of an oncologic disease will be held unless approved by an oncologist during current admission.    For patients receiving oncology care at [facility], inpatient pharmacist contacts patient's oncologist during regular office hours to review. If earlier review is medically necessary, attending physician consults [facility] on-call oncologist  Oral chemotherapy continuation order is on hold pending oncologist review, [facility] oncologist Dr. Leonides Schanz was notified by pharmacy 11/7 AM. Per Dr. Leonides Schanz, okay to hold Brukinsa while the patient is admitted.    Thank you,  Cherylin Mylar, PharmD Clinical Pharmacist  11/7/20248:45 AM

## 2023-10-01 NOTE — Evaluation (Signed)
Occupational Therapy Evaluation Patient Details Name: Cody [name] MRN: [id number] DOB: 09-Dec-1955 Today's Date: 10/01/2023   History of Present Illness Cody [name] is a 67 y.o. male who presented to the ED as code stroke for evaluation of right leg weakness and dysarthria. MRI (-) for CVA. PMH:  CLL, type 2 diabetes, hypertension, hyperlipidemia, history of alcohol abuse in remission, alcoholic cirrhosis, gout.   Clinical Impression   PTA pt lives with his Mom, and his family assists with IADL tasks due to baseline cognitive deficits. Sister states they have cameras in the phone to observe both his Mom and her brother. Spoke with sister over the phone and after discussing his current level of function, he appears close to his baseline.  Independent with mobility and ADL at this time. No further OT needs. Discussed with MD. OT signing off.       If plan is discharge home, recommend the following: Assistance with cooking/housework;Direct supervision/assist for medications management;Direct supervision/assist for financial management;Assist for transportation    Functional Status Assessment  Patient has not had a recent decline in their functional status  Equipment Recommendations  None recommended by OT    Recommendations for Other Services       Precautions / Restrictions Precautions Precautions: Fall Restrictions Weight Bearing Restrictions: No      Mobility Bed Mobility Overal bed mobility: Independent                  Transfers Overall transfer level: Modified independent                        Balance Overall balance assessment: No apparent balance deficits (not formally assessed)                                         ADL either performed or assessed with clinical judgement   ADL Overall ADL's : At baseline                                       General ADL Comments: incontinent at baseline,  wears briefs     Vision Baseline Vision/History: 0 No visual deficits       Perception         Praxis         Pertinent Vitals/Pain Pain Assessment Pain Assessment: No/denies pain     Extremity/Trunk Assessment Upper Extremity Assessment Upper Extremity Assessment: Overall WFL for tasks assessed   Lower Extremity Assessment Lower Extremity Assessment: Overall WFL for tasks assessed   Cervical / Trunk Assessment Cervical / Trunk Assessment: Normal   Communication Communication Communication: Difficulty following commands/understanding;Difficulty communicating thoughts/reduced clarity of speech (speach dysarthric) Following commands: Follows one step commands consistently   Cognition Arousal: Alert Behavior During Therapy: WFL for tasks assessed/performed, Restless Overall Cognitive Status: History of cognitive impairments - at baseline                                Slow processing; apparent executive function deficits General Comments: not oriented to time; able to give information about situation; after talking with siter feel he is close to baseline     General Comments  appears at baseline    Exercises  Shoulder Instructions      Home Living Family/patient expects to be discharged to:: Private residence Living Arrangements: Parent;Other relatives Available Help at Discharge: Family;Available 24 hours/day Type of Home: House Home Access: Stairs to enter [employer] of Steps: 2 Entrance Stairs-Rails: Right Home Layout: One level     Bathroom Shower/Tub: Producer, television/film/video: Standard Bathroom Accessibility: Yes How Accessible: Accessible via walker Home Equipment: Shower seat;Rolling Walker (2 wheels);Cane - single point          Prior Functioning/Environment Prior Level of Function : Independent/Modified Independent;Needs assist               ADLs Comments: Pt does his basic ADL tasks; Mom &  sister/family assist with IADL tasks        OT Problem List: Decreased cognition;Decreased safety awareness      OT Treatment/Interventions:      OT Goals(Current goals can be found in the care plan section) Acute Rehab OT Goals Patient Stated Goal: home now OT Goal Formulation: All assessment and education complete, DC therapy  OT Frequency:      Co-evaluation              AM-PAC OT "6 Clicks" Daily Activity     Outcome Measure Help from another person eating meals?: None Help from another person taking care of personal grooming?: None Help from another person toileting, which includes using toliet, bedpan, or urinal?: A Little Help from another person bathing (including washing, rinsing, drying)?: None Help from another person to put on and taking off regular upper body clothing?: None Help from another person to put on and taking off regular lower body clothing?: None 6 Click Score: 23   End of Session Equipment Utilized During Treatment: Gait belt Nurse Communication: Mobility status  Activity Tolerance: Patient tolerated treatment well Patient left: in bed;with call bell/phone within reach;Other (comment) (stretcher)  OT Visit Diagnosis: Other symptoms and signs involving cognitive function                Time: 1610-9604 OT Time Calculation (min): 22 min Charges:  OT General Charges $OT Visit: 1 Visit OT Evaluation $OT Eval Low Complexity: 1 Low  Allea Kassner, OT/L   Acute OT Clinical Specialist Acute Rehabilitation Services Pager [phone] Office [phone]   [facility] 10/01/2023, 12:22 PM

## 2023-10-01 NOTE — H&P (Signed)
STROKE TEAM BRIEF CURBSIDE NOTE   I was asked by Dr. Isidoro Donning to comment on patient's stroke secondary prevention plan.  I have reviewed the patient's chart.  Presented with transient slurred speech right leg weakness.  MRI is negative for acute stroke.  MRA of the neck and brain is also unremarkable.  Echocardiogram is done but not yet resulted.  LDL cholesterol is slightly elevated at 94.  Hemoglobin A1c is 5.9.  IMPRESSION : Left hemispheric subcortical TIA likely due to small vessel disease Recommend aspirin and Plavix for 3 weeks followed by aspirin alone. Moderate intensity statin. Counsel patient to quit marijuana Follow-up as an outpatient in stroke clinic in 2 months. Discussed with Dr. Loleta Rose, MD

## 2023-10-01 NOTE — ED Notes (Signed)
Pt sister is at the bedside and admits that pt cognition and speech are at baseline.

## 2023-10-01 NOTE — Progress Notes (Signed)
  Echocardiogram 2D Echocardiogram has been performed.  Reinaldo Raddle Marissa Weaver 10/01/2023, 11:52 AM

## 2023-10-01 NOTE — Progress Notes (Addendum)
[facility]                                                                              Cody [name], is a 67 y.o. male, DOB - May 22, 1956, EAV:[id number] Admit date - 09/30/2023    Outpatient Primary MD for the patient is Mirna Mires, MD  LOS - 0  days  Chief Complaint  Patient presents with   Cerebrovascular Accident       Brief summary   Patient is a 67 year old male with CLL, DM type II, HTN, HLP, alcohol abuse in remission, cirrhosis, gout presented to ED for right leg weakness and dysarthria.  Last known well at 9:30 PM on 11/5.  Patient is a poor historian, stays at home with his mother.  Per sister, a day before the admission he went to bed around 9 PM and appeared normal.  On the day of admission on 11/6, she noted on the camera that he had urinated while sitting in a chair and was dragging his right leg, speech was slurred.  Symptoms resolved after patient was brought to ED.  No prior history of stroke.  A month ago, patient had complained of chest pain and was seen by his oncologist and thought to be related to cancer treatment. Patient was admitted for TIA/stroke workup.   Assessment & Plan    Principal Problem:   TIA (transient ischemic attack) -Out of tPA window at the time of arrival, presented with dysarthria, RLE weakness, symptoms have resolved. -CT head negative for acute intracranial abnormality, brain MRI negative for acute CVA -MRA head and neck normal -2D echo results pending -Lipid panel showed HDL 36, LDL 94, continue Lipitor -Patient was placed on aspirin 81 mg daily, received loading dose of Plavix and placed on Plavix 75 mg daily -PT OT results pending -Speech/SLP pending Addendum 4:35pm  Echo showed EF 40-45%, global hypokinesis, G1DD. Discussed with sister, Kiing Deakin, who mentioned that he had 2 episodes of chet pain after starting the new chemo drug for CLL. No prior cardiac work-up or echo to compare.  - Requested  cardiology evaluation   Active Problems: Abnormal EKG -EKG showed TWI inferior laterally, appears new, troponin normal 14 - follow 2D echo    Chronic lymphoid leukemia (HCC) - Previously treated with rituximab which was discontinued due to poor tolerance. Currently on Zanubrutinib.  - WBC count 58., improving from 68K on 10/11 and 95.6 on 9/13 -H&H stable, platelets improving -  Outpatient oncology follow-up.   Marijuana abuse UDS positive for THC.  Counsel to quit.   Type 2 diabetes Last A1c 6.2 in January 2024 -Hemoglobin A1c improving, 5.9, continue sliding scale insulin while inpatient   Hypertension Allow permissive hypertension at this time.     Hyperlipidemia Continue Lipitor.   Chronic gout Continue allopurinol.  Estimated body mass index is 26.31 kg/m as calculated from the following:   Height as of this encounter: 6' (1.829 m).   Weight as of this encounter: 88 kg.  Code Status: Full CODE STATUS DVT Prophylaxis:     Level of Care: Level of care: Telemetry Cardiac Family Communication: Updated patient's sister on  phone  Disposition Plan:      Remains inpatient appropriate: Awaiting 2D echo results, PT, speech/SLP evaluation   Procedures:  MRI brain, MRA head and neck  Consultants:   Antimicrobials:   Anti-infectives (From admission, onward)    None          Medications  allopurinol  300 mg Oral Daily   aspirin EC  81 mg Oral Daily   atorvastatin  10 mg Oral QHS   clopidogrel  75 mg Oral Daily   insulin aspart  0-5 Units Subcutaneous QHS   insulin aspart  0-9 Units Subcutaneous TID WC   loratadine  10 mg Oral Daily      Subjective:   Rushawn Capshaw was seen and examined today.  Poor historian, denies any specific complaints, does not want to go to rehab.  Currently no chest pain, shortness of breath, nausea or vomiting.  Objective:   Vitals:   10/01/23 0721 10/01/23 0800 10/01/23 0900 10/01/23 1205  BP: (!) 170/83 (!) 172/88  (!) 186/83 (!) 166/90  Pulse: 60 (!) 56 (!) 58 66  Resp: 16 17 19 16   Temp: (!) 97.4 F (36.3 C)   (!) 97.4 F (36.3 C)  TempSrc:    Oral  SpO2: 100% 100% 100% 99%  Weight:      Height:       No intake or output data in the 24 hours ending 10/01/23 1210   Wt Readings from Last 3 Encounters:  09/30/23 88 kg  09/04/23 88.4 kg  08/07/23 90.7 kg     Exam General: Alert and oriented to self and place.  NAD.  Mild dysarthria. Cardiovascular: S1 S2 auscultated,  RRR Respiratory: Clear to auscultation bilaterally, no wheezing Gastrointestinal: Soft, nontender, nondistended, + bowel sounds Ext: no pedal edema bilaterally Neuro: strength 5/5 bilaterally UE, LE    Data Reviewed:  I have personally reviewed following labs    CBC Lab Results  Component Value Date   WBC 58.4 (HH) 10/01/2023   RBC 4.47 10/01/2023   HGB 11.7 (L) 10/01/2023   HCT 39.7 10/01/2023   MCV 88.8 10/01/2023   MCH 26.2 10/01/2023   PLT 114 (L) 10/01/2023   MCHC 29.5 (L) 10/01/2023   RDW 14.7 10/01/2023   LYMPHSABS 52.3 (H) 10/01/2023   MONOABS 2.0 (H) 10/01/2023   EOSABS 0.2 10/01/2023   BASOSABS 0.1 10/01/2023     Last metabolic panel Lab Results  Component Value Date   NA 137 10/01/2023   K 4.6 10/01/2023   CL 104 10/01/2023   CO2 25 10/01/2023   BUN 28 (H) 10/01/2023   CREATININE 1.10 10/01/2023   GLUCOSE 103 (H) 10/01/2023   GFRNONAA >60 10/01/2023   GFRAA 51 (L) 05/03/2020   CALCIUM 8.8 (L) 10/01/2023   PROT 7.2 09/30/2023   ALBUMIN 4.2 09/30/2023   BILITOT 1.0 09/30/2023   ALKPHOS 62 09/30/2023   AST 27 09/30/2023   ALT 22 09/30/2023   ANIONGAP 8 10/01/2023    CBG (last 3)  Recent Labs    09/30/23 2223 10/01/23 0759  GLUCAP 100* 80      Coagulation Profile: Recent Labs  Lab 09/30/23 1410  INR 1.1     Radiology Studies: I have personally reviewed the imaging studies  MR BRAIN WO CONTRAST  Result Date: 09/30/2023 CLINICAL DATA:  Altered mental status EXAM: MRI  HEAD WITHOUT CONTRAST MRA HEAD WITHOUT CONTRAST MRA NECK WITHOUT CONTRAST TECHNIQUE: Multiplanar, multiecho pulse sequences of the brain and surrounding structures were obtained  without intravenous contrast. Angiographic images of the Circle of Willis were obtained using MRA technique without intravenous contrast. Angiographic images of the neck were obtained using MRA technique without intravenous contrast. Carotid stenosis measurements (when applicable) are obtained utilizing NASCET criteria, using the distal internal carotid diameter as the denominator. COMPARISON:  None Available. FINDINGS: MRI HEAD FINDINGS Brain: No acute infarct, mass effect or extra-axial collection. No acute or chronic hemorrhage. Mild volume loss. The midline structures are normal. Vascular: Normal flow voids. Skull and upper cervical spine: Normal calvarium and skull base. Visualized upper cervical spine and soft tissues are normal. Sinuses/Orbits:Left maxillary sinus retention cyst.  Normal orbits. MRA HEAD FINDINGS POSTERIOR CIRCULATION: --Vertebral arteries: Normal --Inferior cerebellar arteries: Normal. --Basilar artery: Normal. --Superior cerebellar arteries: Normal. --Posterior cerebral arteries: Normal. Both posterior communicating arteries are patent. ANTERIOR CIRCULATION: --Intracranial internal carotid arteries: Normal. --Anterior cerebral arteries (ACA): Normal. --Middle cerebral arteries (MCA): Normal. MRA NECK FINDINGS No dissection, occlusion or hemodynamically significant stenosis of the carotid or vertebral arteries. The vertebral system is mildly right dominant. IMPRESSION: 1. No acute intracranial abnormality. 2. Normal intracranial MRA. 3. Normal MRA of the neck. Electronically Signed   By: Deatra Robinson M.D.   On: 09/30/2023 19:35   MR ANGIO HEAD WO CONTRAST  Result Date: 09/30/2023 CLINICAL DATA:  Altered mental status EXAM: MRI HEAD WITHOUT CONTRAST MRA HEAD WITHOUT CONTRAST MRA NECK WITHOUT CONTRAST TECHNIQUE:  Multiplanar, multiecho pulse sequences of the brain and surrounding structures were obtained without intravenous contrast. Angiographic images of the Circle of Willis were obtained using MRA technique without intravenous contrast. Angiographic images of the neck were obtained using MRA technique without intravenous contrast. Carotid stenosis measurements (when applicable) are obtained utilizing NASCET criteria, using the distal internal carotid diameter as the denominator. COMPARISON:  None Available. FINDINGS: MRI HEAD FINDINGS Brain: No acute infarct, mass effect or extra-axial collection. No acute or chronic hemorrhage. Mild volume loss. The midline structures are normal. Vascular: Normal flow voids. Skull and upper cervical spine: Normal calvarium and skull base. Visualized upper cervical spine and soft tissues are normal. Sinuses/Orbits:Left maxillary sinus retention cyst.  Normal orbits. MRA HEAD FINDINGS POSTERIOR CIRCULATION: --Vertebral arteries: Normal --Inferior cerebellar arteries: Normal. --Basilar artery: Normal. --Superior cerebellar arteries: Normal. --Posterior cerebral arteries: Normal. Both posterior communicating arteries are patent. ANTERIOR CIRCULATION: --Intracranial internal carotid arteries: Normal. --Anterior cerebral arteries (ACA): Normal. --Middle cerebral arteries (MCA): Normal. MRA NECK FINDINGS No dissection, occlusion or hemodynamically significant stenosis of the carotid or vertebral arteries. The vertebral system is mildly right dominant. IMPRESSION: 1. No acute intracranial abnormality. 2. Normal intracranial MRA. 3. Normal MRA of the neck. Electronically Signed   By: Deatra Robinson M.D.   On: 09/30/2023 19:35   MR ANGIO NECK WO CONTRAST  Result Date: 09/30/2023 CLINICAL DATA:  Altered mental status EXAM: MRI HEAD WITHOUT CONTRAST MRA HEAD WITHOUT CONTRAST MRA NECK WITHOUT CONTRAST TECHNIQUE: Multiplanar, multiecho pulse sequences of the brain and surrounding structures were  obtained without intravenous contrast. Angiographic images of the Circle of Willis were obtained using MRA technique without intravenous contrast. Angiographic images of the neck were obtained using MRA technique without intravenous contrast. Carotid stenosis measurements (when applicable) are obtained utilizing NASCET criteria, using the distal internal carotid diameter as the denominator. COMPARISON:  None Available. FINDINGS: MRI HEAD FINDINGS Brain: No acute infarct, mass effect or extra-axial collection. No acute or chronic hemorrhage. Mild volume loss. The midline structures are normal. Vascular: Normal flow voids. Skull and upper cervical spine: Normal calvarium and  skull base. Visualized upper cervical spine and soft tissues are normal. Sinuses/Orbits:Left maxillary sinus retention cyst.  Normal orbits. MRA HEAD FINDINGS POSTERIOR CIRCULATION: --Vertebral arteries: Normal --Inferior cerebellar arteries: Normal. --Basilar artery: Normal. --Superior cerebellar arteries: Normal. --Posterior cerebral arteries: Normal. Both posterior communicating arteries are patent. ANTERIOR CIRCULATION: --Intracranial internal carotid arteries: Normal. --Anterior cerebral arteries (ACA): Normal. --Middle cerebral arteries (MCA): Normal. MRA NECK FINDINGS No dissection, occlusion or hemodynamically significant stenosis of the carotid or vertebral arteries. The vertebral system is mildly right dominant. IMPRESSION: 1. No acute intracranial abnormality. 2. Normal intracranial MRA. 3. Normal MRA of the neck. Electronically Signed   By: Deatra Robinson M.D.   On: 09/30/2023 19:35   CT HEAD WO CONTRAST  Result Date: 09/30/2023 CLINICAL DATA:  Mental status change, unknown cause, stroke suspected EXAM: CT HEAD WITHOUT CONTRAST TECHNIQUE: Contiguous axial images were obtained from the base of the skull through the vertex without intravenous contrast. RADIATION DOSE REDUCTION: This exam was performed according to the departmental  dose-optimization program which includes automated exposure control, adjustment of the mA and/or kV according to patient size and/or use of iterative reconstruction technique. COMPARISON:  None Available. FINDINGS: Evaluation is somewhat limited by motion artifact. Brain: No evidence of acute infarction, hemorrhage, mass, mass effect, or midline shift. No hydrocephalus or extra-axial collection. Vascular: No hyperdense vessel. Skull: Negative for fracture or focal lesion. Sinuses/Orbits: Mucosal thickening in the ethmoid air cells. Mucous retention cyst in the left frontal sinus. Other: The mastoid air cells are well aerated. ASPECTS [facility] Stroke Program Early CT Score) - Ganglionic level infarction (caudate, lentiform nuclei, internal capsule, insula, M1-M3 cortex): 7 - Supraganglionic infarction (M4-M6 cortex): 3 Total score (0-10 with 10 being normal): 10 IMPRESSION: No acute intracranial process. ASPECTS is 10. Code stroke imaging results were communicated on 09/30/2023 at 4:33 pm to provider [place] via telephone, who verbally acknowledged these results. Electronically Signed   By: Wiliam Ke M.D.   On: 09/30/2023 16:41       Nikita Humble M.D. [facility] 10/01/2023, 12:10 PM  Available via Epic secure chat 7am-7pm After 7 pm, please refer to night coverage provider listed on amion.

## 2023-10-01 NOTE — Progress Notes (Signed)
SLP Cancellation Note  Patient Details Name: Cody [name] MRN: [id number] DOB: 16-Feb-1956   Cancelled treatment:       Reason Eval/Treat Not Completed: SLP screened, no needs identified, will sign off; Chart review revealed sister stating speech/cognition back to baseline function. MRI negative.  No ST needs identified.   Pat [name],M.S.,CCC-SLP 10/01/2023, 1:59 PM

## 2023-10-01 NOTE — Evaluation (Signed)
Physical Therapy Evaluation Patient Details Name: Cody [name] MRN: [id number] DOB: 1956/08/09 Today's Date: 10/01/2023  History of Present Illness  Cody [name] is a 67 y.o. male who presented to the ED as code stroke for evaluation of right leg weakness and dysarthria. MRI (-) for CVA. PMH:  CLL, type 2 diabetes, hypertension, hyperlipidemia, history of alcohol abuse in remission, alcoholic cirrhosis, gout.  Clinical Impression  The patient is eager to return home. Patient   demonstrates intact LE motor and sensory, ambulated with no device and no LOB  . Patient ,ay benefit from HHPT to ensure safe tansition.  Pt admitted with above diagnosis.  Pt currently with functional limitations due to the deficits listed below (see PT Problem List). Pt will benefit from acute skilled PT to increase their independence and safety with mobility to allow discharge.         If plan is discharge home, recommend the following: Assistance with cooking/housework;Assist for transportation;Help with stairs or ramp for entrance   Can travel by private vehicle        Equipment Recommendations None recommended by PT  Recommendations for Other Services       Functional Status Assessment Patient has not had a recent decline in their functional status     Precautions / Restrictions Precautions Precautions: Fall Restrictions Weight Bearing Restrictions: No      Mobility  Bed Mobility Overal bed mobility: Independent                  Transfers Overall transfer level: Needs assistance Equipment used: Rolling walker (2 wheels), None Transfers: Sit to/from Stand Sit to Stand: Supervision                Ambulation/Gait Ambulation/Gait assistance: Supervision Gait Distance (Feet): 50 Feet Assistive device: None, Rolling walker (2 wheels) Gait Pattern/deviations: Step-through pattern       General Gait Details: gait steady to ambulate  around the room, did not use  Rw, held it up in the air so ambukated farther with no Games developer     Tilt Bed    Modified Rankin (Stroke Patients Only)       Balance Overall balance assessment: Mild deficits observed, not formally tested                                           Pertinent Vitals/Pain Pain Assessment Pain Assessment: No/denies pain    Home Living Family/patient expects to be discharged to:: Private residence Living Arrangements: Parent;Other relatives Available Help at Discharge: Family;Available 24 hours/day Type of Home: House Home Access: Stairs to enter Entrance Stairs-Rails: Right Entrance Stairs-Number of Steps: 2   Home Layout: One level Home Equipment: Pharmacist, hospital (2 wheels);Cane - single point      Prior Function Prior Level of Function : Independent/Modified Independent;Needs assist               ADLs Comments: Pt does his basic ADL tasks; Mom & sister/family assist with IADL tasks     Extremity/Trunk Assessment   Upper Extremity Assessment Upper Extremity Assessment: Overall WFL for tasks assessed    Lower Extremity Assessment Lower Extremity Assessment: Overall WFL for tasks assessed    Cervical / Trunk Assessment Cervical / Trunk Assessment: Normal  Communication   Communication Communication: Difficulty  following commands/understanding;Difficulty communicating thoughts/reduced clarity of speech (speach dysarthric) Following commands: Follows one step commands consistently  Cognition Arousal: Alert Behavior During Therapy: WFL for tasks assessed/performed, Restless Overall Cognitive Status: History of cognitive impairments - at baseline                                 General Comments: not oriented to time; able to give information about situation; after talking with ssiter feel he is close to baseline        General Comments General comments (skin integrity,  edema, etc.): appears at baseline    Exercises     Assessment/Plan    PT Assessment Patient needs continued PT services  PT Problem List Decreased strength;Decreased mobility;Decreased cognition       PT Treatment Interventions DME instruction    PT Goals (Current goals can be found in the Care Plan section)  Acute Rehab PT Goals Patient Stated Goal: go home PT Goal Formulation: With patient Time For Goal Achievement: 10/15/23 Potential to Achieve Goals: Good    Frequency Min 1X/week     Co-evaluation               AM-PAC PT "6 Clicks" Mobility  Outcome Measure Help needed turning from your back to your side while in a flat bed without using bedrails?: None Help needed moving from lying on your back to sitting on the side of a flat bed without using bedrails?: None Help needed moving to and from a bed to a chair (including a wheelchair)?: A Little Help needed standing up from a chair using your arms (e.g., wheelchair or bedside chair)?: A Little Help needed to walk in hospital room?: A Little Help needed climbing 3-5 steps with a railing? : A Little 6 Click Score: 20    End of Session Equipment Utilized During Treatment: Gait belt Activity Tolerance: Patient tolerated treatment well Patient left: in bed;with call bell/phone within reach Nurse Communication: Mobility status PT Visit Diagnosis: Unsteadiness on feet (R26.81)    Time: 1610-9604 PT Time Calculation (min) (ACUTE ONLY): 23 min   Charges:   PT Evaluation $PT Eval Low Complexity: 1 Low PT Treatments $Gait Training: 8-22 mins PT General Charges $$ ACUTE PT VISIT: 1 Visit         Blanchard Kelch PT Acute Rehabilitation Services Office [phone] Weekend pager-[phone]   Rada Hay 10/01/2023, 12:26 PM

## 2023-10-01 NOTE — ED Notes (Signed)
Pt was able to urinate in urinal with assistance.

## 2023-10-02 ENCOUNTER — Other Ambulatory Visit: Payer: Self-pay

## 2023-10-02 ENCOUNTER — Telehealth: Payer: Self-pay | Admitting: Cardiology

## 2023-10-02 DIAGNOSIS — F1210 Cannabis abuse, uncomplicated: Secondary | ICD-10-CM | POA: Diagnosis not present

## 2023-10-02 DIAGNOSIS — I5020 Unspecified systolic (congestive) heart failure: Secondary | ICD-10-CM | POA: Diagnosis not present

## 2023-10-02 DIAGNOSIS — C9111 Chronic lymphocytic leukemia of B-cell type in remission: Secondary | ICD-10-CM | POA: Diagnosis not present

## 2023-10-02 DIAGNOSIS — I110 Hypertensive heart disease with heart failure: Secondary | ICD-10-CM | POA: Diagnosis not present

## 2023-10-02 DIAGNOSIS — E785 Hyperlipidemia, unspecified: Secondary | ICD-10-CM | POA: Diagnosis not present

## 2023-10-02 DIAGNOSIS — G459 Transient cerebral ischemic attack, unspecified: Secondary | ICD-10-CM | POA: Diagnosis not present

## 2023-10-02 DIAGNOSIS — E119 Type 2 diabetes mellitus without complications: Secondary | ICD-10-CM | POA: Diagnosis not present

## 2023-10-02 DIAGNOSIS — R471 Dysarthria and anarthria: Secondary | ICD-10-CM | POA: Diagnosis not present

## 2023-10-02 DIAGNOSIS — E1100 Type 2 diabetes mellitus with hyperosmolarity without nonketotic hyperglycemic-hyperosmolar coma (NKHHC): Secondary | ICD-10-CM

## 2023-10-02 DIAGNOSIS — R9431 Abnormal electrocardiogram [ECG] [EKG]: Secondary | ICD-10-CM | POA: Diagnosis not present

## 2023-10-02 DIAGNOSIS — I428 Other cardiomyopathies: Secondary | ICD-10-CM

## 2023-10-02 DIAGNOSIS — Z794 Long term (current) use of insulin: Secondary | ICD-10-CM | POA: Diagnosis not present

## 2023-10-02 DIAGNOSIS — R531 Weakness: Secondary | ICD-10-CM | POA: Diagnosis not present

## 2023-10-02 LAB — GLUCOSE, CAPILLARY
Glucose-Capillary: 87 mg/dL (ref 70–99)
Glucose-Capillary: 105 mg/dL — ABNORMAL HIGH (ref 70–99)

## 2023-10-02 MED ORDER — LOSARTAN POTASSIUM 50 MG PO TABS
50.0000 mg | ORAL_TABLET | Freq: Every day | ORAL | Status: DC
  Administered 2023-10-02: 50 mg via ORAL
  Filled 2023-10-02: qty 1

## 2023-10-02 MED ORDER — ATORVASTATIN CALCIUM 10 MG PO TABS: 20.0000 mg | ORAL_TABLET | Freq: Every day | ORAL | Status: DC

## 2023-10-02 MED ORDER — ASPIRIN 81 MG PO TBEC: 81.0000 mg | DELAYED_RELEASE_TABLET | Freq: Every day | ORAL | 12 refills | Status: AC

## 2023-10-02 MED ORDER — CARVEDILOL 3.125 MG PO TABS
3.1250 mg | ORAL_TABLET | Freq: Two times a day (BID) | ORAL | Status: DC
  Administered 2023-10-02: 3.125 mg via ORAL
  Filled 2023-10-02 (×2): qty 1

## 2023-10-02 MED ORDER — LOSARTAN POTASSIUM 50 MG PO TABS: 50.0000 mg | ORAL_TABLET | Freq: Every day | ORAL | 3 refills | Status: DC

## 2023-10-02 MED ORDER — CLOPIDOGREL BISULFATE 75 MG PO TABS: 75.0000 mg | ORAL_TABLET | Freq: Every day | ORAL | 0 refills | Status: AC

## 2023-10-02 MED ORDER — CARVEDILOL 3.125 MG PO TABS: 3.1250 mg | ORAL_TABLET | Freq: Two times a day (BID) | ORAL | 3 refills | Status: DC

## 2023-10-02 MED ORDER — DAPAGLIFLOZIN PROPANEDIOL 10 MG PO TABS: 10.0000 mg | ORAL_TABLET | Freq: Every day | ORAL | 3 refills | Status: AC

## 2023-10-02 MED ORDER — ATORVASTATIN CALCIUM 40 MG PO TABS: 40.0000 mg | ORAL_TABLET | Freq: Every day | ORAL | Status: DC

## 2023-10-02 MED ORDER — ATORVASTATIN CALCIUM 20 MG PO TABS: 20.0000 mg | ORAL_TABLET | Freq: Every day | ORAL | 3 refills | Status: AC

## 2023-10-02 NOTE — Consult Note (Addendum)
Cardiology Consultation   Patient ID: Cody [name] MRN: [id number]; DOB: [birth year]  Admit date: 09/30/2023 Date of Consult: 10/02/2023  PCP:  Mirna Mires, MD   [facility] Providers Cardiologist:  None   {  Patient Profile:   Cody [name] is a 67 y.o. 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 Isidoro Donning.  History of Present Illness:   Mr. Stockdale has no prior cardiac history.  He denies any family history of cardiac disease or cardiomyopathies and is not aware of any genetic abnormalities.  He stated that he has never smoked, drank, or has done drugs, but he has alcoholic cirrhosis listed.  He has history of CLL and was treated with rituximab previously in the past but was discontinued due to poor tolerance.  Currently on Zanubrutinib   Currently patient being evaluated for TIA.  Presented with right lower leg weakness and slurred speech with resolution of symptoms.  CT angiogram MRI of the head and neck have been negative.  Neurology feels subcortical TIA secondary to small vessel disease as placed him on aspirin and Plavix for 3 weeks and then asked alone.  Cardiology has been asked to see due to abnormal echocardiogram demonstrating mildly reduced EF 40 to 45% with prominent trabeculations and concerns for noncompaction cardiomyopathy and T wave inversions.  Patient is a poor historian and agitated/angry during examination.  He is wanting to leave the hospital.  Prior notes and charts and stated that he had an episode of chest pain approximately 1 month ago after starting new chemotherapy.  He was not very forthcoming about the symptoms but denied it being exertional and denied any associated symptoms this is radiation, shortness of breath, diaphoresis.  He said it was a very brief episode and he was just  sitting when it happened.  He said this was an isolated episode with no preceding or recurrences of this chest pain.  Denies any heart failure like symptoms such as orthopnea, peripheral edema, PND, shortness of breath.  CT and MRI of brain showed no acute abnormalities.  BUN 28, creatinine 1.1 previously elevated, troponin 14.  WBC 58.4.  Hemoglobin 11.7.  Had elevated lymphocytes and monocytes granulocytes.  He has positive for THC.   Past Medical History:  Diagnosis Date   Alcoholic cirrhosis (HCC)    Cataract    Chronic lymphoid leukemia (HCC)    Diabetes mellitus without complication (HCC)    High blood pressure 10/24/2004   History of alcohol abuse    Hypercholesterolemia     History reviewed. No pertinent surgical history.   Inpatient Medications: Scheduled Meds:  allopurinol  300 mg Oral Daily   aspirin EC  81 mg Oral Daily   atorvastatin  10 mg Oral QHS   clopidogrel  75 mg Oral Daily   insulin aspart  0-5 Units Subcutaneous QHS   insulin aspart  0-9 Units Subcutaneous TID WC   loratadine  10 mg Oral Daily   Continuous Infusions:  PRN Meds: acetaminophen **OR** acetaminophen (TYLENOL) oral liquid 160 mg/5 mL **OR** acetaminophen  Allergies:    Allergies  Allergen Reactions   Rituxan [Rituximab] Other (See Comments)    "Shaking all over and diaphoretic"    Social History:   Social History   Socioeconomic History   Marital status: Divorced    Spouse name: Not on file  Number of children: Not on file   Years of education: Not on file   Highest education level: Not on file  Occupational History   Not on file  Tobacco Use   Smoking status: Never   Smokeless tobacco: Never  Substance and Sexual Activity   Alcohol use: Not Currently   Drug use: No   Sexual activity: Not on file  Other Topics Concern   Not on file  Social History Narrative   Not on file   Social Determinants of Health   Financial Resource Strain: Not on file  Food Insecurity: No  Food Insecurity (10/01/2023)   Hunger Vital Sign    Worried About Running Out of Food in the Last Year: Never true    Ran Out of Food in the Last Year: Never true  Transportation Needs: No Transportation Needs (10/01/2023)   PRAPARE - Administrator, Civil Service (Medical): No    Lack of Transportation (Non-Medical): No  Physical Activity: Not on file  Stress: Not on file  Social Connections: Not on file  Intimate Partner Violence: Not At Risk (10/01/2023)   Humiliation, Afraid, Rape, and Kick questionnaire    Fear of Current or Ex-Partner: No    Emotionally Abused: No    Physically Abused: No    Sexually Abused: No    Family History:   Family History  Problem Relation Age of Onset   Alcohol abuse Father    Cirrhosis Father    Coronary artery disease Father      ROS:  Please see the history of present illness.   All other ROS reviewed and negative.     Physical Exam/Data:   Vitals:   10/01/23 1805 10/01/23 2025 10/02/23 0003 10/02/23 0358  BP: (!) 174/83 (!) 178/79 (!) 161/76 (!) 163/70  Pulse: 61 (!) 58 (!) 54 (!) 58  Resp:      Temp: (!) 97.4 F (36.3 C) (!) 97.4 F (36.3 C) 97.6 F (36.4 C) 97.7 F (36.5 C)  TempSrc: Oral Oral Oral   SpO2: 97% 98% 100% 99%  Weight:      Height:       No intake or output data in the 24 hours ending 10/02/23 0758    09/30/2023    1:51 PM 09/04/2023   10:56 AM 08/07/2023   10:44 AM  Last 3 Weights  Weight (lbs) 194 lb 194 lb 12.8 oz 200 lb  Weight (kg) 87.998 kg 88.361 kg 90.719 kg     Body mass index is 26.31 kg/m.  General:  Well nourished, well developed, in no acute distress HEENT: normal Neck: no JVD Vascular: No carotid bruits; Distal pulses 2+ bilaterally Cardiac:  normal S1, S2; RRR; no murmur  Lungs:  clear to auscultation bilaterally, no wheezing, rhonchi or rales.  Diminished Abd: soft, nontender, no hepatomegaly  Ext: no edema Musculoskeletal:  No deformities, BUE and BLE strength normal and  equal Skin: warm and dry  Neuro:  CNs 2-12 intact, no focal abnormalities noted Psych:  Normal affect   EKG:  The EKG was personally reviewed and demonstrates: Normal sinus rhythm, heart rate 75.  First-degree AV block, PR 246 LVH.  T wave inversions laterally.  Slight ST depressions laterally Telemetry:  Telemetry was personally reviewed and demonstrates: Sinus rhythm, heart rate between 50 and 60.  ST depressions.  Relevant CV Studies: Echocardiogram 10/01/2023 1. 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 Left ventricular ejection fraction, by estimation, is 40  to  45%. The left ventricle has mildly decreased function. The left  ventricle demonstrates global hypokinesis.   There is moderate left ventricular hypertrophy. Left ventricular  diastolic parameters are consistent with Grade I diastolic dysfunction  (impaired relaxation).   2. Right ventricular systolic function is normal. The right ventricular  size is normal. Tricuspid regurgitation signal is inadequate for assessing  PA pressure.   3. The mitral valve is normal in structure. Trivial mitral valve  regurgitation. No evidence of mitral stenosis.   4. The aortic valve is tricuspid. Aortic valve regurgitation is trivial.  No aortic stenosis is present.   5. The inferior vena cava is dilated in size with >50% respiratory  variability, suggesting right atrial pressure of 8 mmHg.   Laboratory Data:  High Sensitivity Troponin:   Recent Labs  Lab 09/30/23 2217  TROPONINIHS 14     Chemistry Recent Labs  Lab 09/30/23 1410 09/30/23 1429 10/01/23 0520  NA 140 142 137  K 4.8 4.6 4.6  CL 107 104 104  CO2 26  --  25  GLUCOSE 83 81 103*  BUN 27* 28* 28*  CREATININE 1.10 1.30* 1.10  CALCIUM 8.8*  --  8.8*  GFRNONAA >60  --  >60  ANIONGAP 7  --  8    Recent Labs  Lab 09/30/23 1410  PROT 7.2  ALBUMIN 4.2  AST 27  ALT 22  ALKPHOS 62  BILITOT 1.0   Lipids  Recent Labs   Lab 10/01/23 0520  CHOL 158  TRIG 141  HDL 36*  LDLCALC 94  CHOLHDL 4.4    Hematology Recent Labs  Lab 09/30/23 1410 09/30/23 1429 10/01/23 0520  WBC 58.1*  --  58.4*  RBC 4.00*  --  4.47  HGB 10.4* 12.2* 11.7*  HCT 35.7* 36.0* 39.7  MCV 89.3  --  88.8  MCH 26.0  --  26.2  MCHC 29.1*  --  29.5*  RDW 14.9  --  14.7  PLT 108*  --  114*   Thyroid No results for input(s): "TSH", "FREET4" in the last 168 hours.  BNPNo results for input(s): "BNP", "PROBNP" in the last 168 hours.  DDimer No results for input(s): "DDIMER" in the last 168 hours.   Radiology/Studies:  ECHOCARDIOGRAM COMPLETE  Result Date: 10/01/2023    ECHOCARDIOGRAM REPORT   Patient Name:   ALVARO CURRIN Date of Exam: 10/01/2023 Medical Rec #:  [id number]             Height:       72.0 in Accession #:    [id number]            Weight:       194.0 lb Date of Birth:  08-15-1956              BSA:          2.103 m Patient Age:    67 years              BP:           186/83 mmHg Patient Gender: M                     HR:           63 bpm. Exam Location:  Inpatient Procedure: 2D Echo, Cardiac Doppler and Color Doppler Indications:    TIA  History:        Patient has no prior history of Echocardiogram examinations.  TIA; Risk Factors:Hypertension, Diabetes and Dyslipidemia.  Sonographer:    Karma Ganja Referring Phys: [id number] VASUNDHRA RATHORE IMPRESSIONS  1. 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 Left ventricular ejection fraction, by estimation, is 40 to 45%. The left ventricle has mildly decreased function. The left ventricle demonstrates global hypokinesis.  There is moderate left ventricular hypertrophy. Left ventricular diastolic parameters are consistent with Grade I diastolic dysfunction (impaired relaxation).  2. Right ventricular systolic function is normal. The right ventricular size is normal. Tricuspid regurgitation signal is inadequate for assessing PA  pressure.  3. The mitral valve is normal in structure. Trivial mitral valve regurgitation. No evidence of mitral stenosis.  4. The aortic valve is tricuspid. Aortic valve regurgitation is trivial. No aortic stenosis is present.  5. The inferior vena cava is dilated in size with >50% respiratory variability, suggesting right atrial pressure of 8 mmHg. 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 Left ventricular ejection fraction, by estimation, is 40 to 45%. The left ventricle has mildly decreased function. The left ventricle demonstrates global  hypokinesis. The left ventricular internal cavity size was normal in size. There is moderate left ventricular hypertrophy. Left ventricular diastolic parameters are consistent with Grade I diastolic dysfunction (impaired relaxation). Right Ventricle: The right ventricular size is normal. No increase in right ventricular wall thickness. Right ventricular systolic function is normal. Tricuspid regurgitation signal is inadequate for assessing PA pressure. Left Atrium: Left atrial size was normal in size. Right Atrium: Right atrial size was normal in size. Pericardium: There is no evidence of pericardial effusion. Mitral Valve: The mitral valve is normal in structure. Trivial mitral valve regurgitation. No evidence of mitral valve stenosis. Tricuspid Valve: The tricuspid valve is normal in structure. Tricuspid valve regurgitation is not demonstrated. No evidence of tricuspid stenosis. Aortic Valve: The aortic valve is tricuspid. Aortic valve regurgitation is trivial. No aortic stenosis is present. Aortic valve mean gradient measures 3.0 mmHg. Aortic valve peak gradient measures 5.6 mmHg. Aortic valve area, by VTI measures 2.52 cm. Pulmonic Valve: The pulmonic valve was normal in structure. Pulmonic valve regurgitation is trivial. No evidence of pulmonic stenosis. Aorta: The aortic root is normal in size and  structure. Venous: The inferior vena cava is dilated in size with greater than 50% respiratory variability, suggesting right atrial pressure of 8 mmHg. IAS/Shunts: No atrial level shunt detected by color flow Doppler.  LEFT VENTRICLE PLAX 2D LVIDd:         5.30 cm      Diastology LVIDs:         4.20 cm      LV e' medial:    4.13 cm/s LV PW:         1.30 cm      LV E/e' medial:  10.0 LV IVS:        1.70 cm      LV e' lateral:   7.83 cm/s LVOT diam:     2.20 cm      LV E/e' lateral: 5.2 LV SV:         67 LV SV Index:   32 LVOT Area:     3.80 cm  LV Volumes (MOD) LV vol d, MOD A2C: 143.0 ml LV vol d, MOD A4C: 149.0 ml LV vol s, MOD A2C: 82.3 ml LV vol s, MOD A4C: 87.2 ml LV SV MOD A2C:     60.7 ml LV SV MOD A4C:     149.0 ml LV SV  MOD BP:      64.1 ml RIGHT VENTRICLE             IVC RV Basal diam:  4.00 cm     IVC diam: 2.10 cm RV S prime:     11.50 cm/s TAPSE (M-mode): 3.0 cm LEFT ATRIUM              Index        RIGHT ATRIUM           Index LA diam:        3.60 cm  1.71 cm/m   RA Area:     25.20 cm LA Vol (A2C):   131.0 ml 62.29 ml/m  RA Volume:   93.50 ml  44.46 ml/m LA Vol (A4C):   54.9 ml  26.10 ml/m LA Biplane Vol: 89.2 ml  42.41 ml/m  AORTIC VALVE AV Area (Vmax):    2.90 cm AV Area (Vmean):   2.38 cm AV Area (VTI):     2.52 cm AV Vmax:           118.00 cm/s AV Vmean:          89.100 cm/s AV VTI:            0.266 m AV Peak Grad:      5.6 mmHg AV Mean Grad:      3.0 mmHg LVOT Vmax:         90.10 cm/s LVOT Vmean:        55.800 cm/s LVOT VTI:          0.176 m LVOT/AV VTI ratio: 0.66  AORTA Ao Root diam: 3.60 cm Ao Asc diam:  3.40 cm MITRAL VALVE MV Area (PHT): 2.60 cm    SHUNTS MV Decel Time: 292 msec    Systemic VTI:  0.18 m MV E velocity: 41.10 cm/s  Systemic Diam: 2.20 cm MV A velocity: 52.30 cm/s MV E/A ratio:  0.79 Weston Brass MD Electronically signed by Weston Brass MD Signature Date/Time: 10/01/2023/4:17:07 PM    Final    MR BRAIN WO CONTRAST  Result Date: 09/30/2023 CLINICAL DATA:   Altered mental status EXAM: MRI HEAD WITHOUT CONTRAST MRA HEAD WITHOUT CONTRAST MRA NECK WITHOUT CONTRAST TECHNIQUE: Multiplanar, multiecho pulse sequences of the brain and surrounding structures were obtained without intravenous contrast. Angiographic images of the Circle of Willis were obtained using MRA technique without intravenous contrast. Angiographic images of the neck were obtained using MRA technique without intravenous contrast. Carotid stenosis measurements (when applicable) are obtained utilizing NASCET criteria, using the distal internal carotid diameter as the denominator. COMPARISON:  None Available. FINDINGS: MRI HEAD FINDINGS Brain: No acute infarct, mass effect or extra-axial collection. No acute or chronic hemorrhage. Mild volume loss. The midline structures are normal. Vascular: Normal flow voids. Skull and upper cervical spine: Normal calvarium and skull base. Visualized upper cervical spine and soft tissues are normal. Sinuses/Orbits:Left maxillary sinus retention cyst.  Normal orbits. MRA HEAD FINDINGS POSTERIOR CIRCULATION: --Vertebral arteries: Normal --Inferior cerebellar arteries: Normal. --Basilar artery: Normal. --Superior cerebellar arteries: Normal. --Posterior cerebral arteries: Normal. Both posterior communicating arteries are patent. ANTERIOR CIRCULATION: --Intracranial internal carotid arteries: Normal. --Anterior cerebral arteries (ACA): Normal. --Middle cerebral arteries (MCA): Normal. MRA NECK FINDINGS No dissection, occlusion or hemodynamically significant stenosis of the carotid or vertebral arteries. The vertebral system is mildly right dominant. IMPRESSION: 1. No acute intracranial abnormality. 2. Normal intracranial MRA. 3. Normal MRA of the neck. Electronically Signed   By: Chrisandra Netters.D.  On: 09/30/2023 19:35   MR ANGIO HEAD WO CONTRAST  Result Date: 09/30/2023 CLINICAL DATA:  Altered mental status EXAM: MRI HEAD WITHOUT CONTRAST MRA HEAD WITHOUT CONTRAST MRA  NECK WITHOUT CONTRAST TECHNIQUE: Multiplanar, multiecho pulse sequences of the brain and surrounding structures were obtained without intravenous contrast. Angiographic images of the Circle of Willis were obtained using MRA technique without intravenous contrast. Angiographic images of the neck were obtained using MRA technique without intravenous contrast. Carotid stenosis measurements (when applicable) are obtained utilizing NASCET criteria, using the distal internal carotid diameter as the denominator. COMPARISON:  None Available. FINDINGS: MRI HEAD FINDINGS Brain: No acute infarct, mass effect or extra-axial collection. No acute or chronic hemorrhage. Mild volume loss. The midline structures are normal. Vascular: Normal flow voids. Skull and upper cervical spine: Normal calvarium and skull base. Visualized upper cervical spine and soft tissues are normal. Sinuses/Orbits:Left maxillary sinus retention cyst.  Normal orbits. MRA HEAD FINDINGS POSTERIOR CIRCULATION: --Vertebral arteries: Normal --Inferior cerebellar arteries: Normal. --Basilar artery: Normal. --Superior cerebellar arteries: Normal. --Posterior cerebral arteries: Normal. Both posterior communicating arteries are patent. ANTERIOR CIRCULATION: --Intracranial internal carotid arteries: Normal. --Anterior cerebral arteries (ACA): Normal. --Middle cerebral arteries (MCA): Normal. MRA NECK FINDINGS No dissection, occlusion or hemodynamically significant stenosis of the carotid or vertebral arteries. The vertebral system is mildly right dominant. IMPRESSION: 1. No acute intracranial abnormality. 2. Normal intracranial MRA. 3. Normal MRA of the neck. Electronically Signed   By: Deatra Robinson M.D.   On: 09/30/2023 19:35   MR ANGIO NECK WO CONTRAST  Result Date: 09/30/2023 CLINICAL DATA:  Altered mental status EXAM: MRI HEAD WITHOUT CONTRAST MRA HEAD WITHOUT CONTRAST MRA NECK WITHOUT CONTRAST TECHNIQUE: Multiplanar, multiecho pulse sequences of the brain  and surrounding structures were obtained without intravenous contrast. Angiographic images of the Circle of Willis were obtained using MRA technique without intravenous contrast. Angiographic images of the neck were obtained using MRA technique without intravenous contrast. Carotid stenosis measurements (when applicable) are obtained utilizing NASCET criteria, using the distal internal carotid diameter as the denominator. COMPARISON:  None Available. FINDINGS: MRI HEAD FINDINGS Brain: No acute infarct, mass effect or extra-axial collection. No acute or chronic hemorrhage. Mild volume loss. The midline structures are normal. Vascular: Normal flow voids. Skull and upper cervical spine: Normal calvarium and skull base. Visualized upper cervical spine and soft tissues are normal. Sinuses/Orbits:Left maxillary sinus retention cyst.  Normal orbits. MRA HEAD FINDINGS POSTERIOR CIRCULATION: --Vertebral arteries: Normal --Inferior cerebellar arteries: Normal. --Basilar artery: Normal. --Superior cerebellar arteries: Normal. --Posterior cerebral arteries: Normal. Both posterior communicating arteries are patent. ANTERIOR CIRCULATION: --Intracranial internal carotid arteries: Normal. --Anterior cerebral arteries (ACA): Normal. --Middle cerebral arteries (MCA): Normal. MRA NECK FINDINGS No dissection, occlusion or hemodynamically significant stenosis of the carotid or vertebral arteries. The vertebral system is mildly right dominant. IMPRESSION: 1. No acute intracranial abnormality. 2. Normal intracranial MRA. 3. Normal MRA of the neck. Electronically Signed   By: Deatra Robinson M.D.   On: 09/30/2023 19:35   CT HEAD WO CONTRAST  Result Date: 09/30/2023 CLINICAL DATA:  Mental status change, unknown cause, stroke suspected EXAM: CT HEAD WITHOUT CONTRAST TECHNIQUE: Contiguous axial images were obtained from the base of the skull through the vertex without intravenous contrast. RADIATION DOSE REDUCTION: This exam was performed  according to the departmental dose-optimization program which includes automated exposure control, adjustment of the mA and/or kV according to patient size and/or use of iterative reconstruction technique. COMPARISON:  None Available. FINDINGS: Evaluation is somewhat limited by motion  artifact. Brain: No evidence of acute infarction, hemorrhage, mass, mass effect, or midline shift. No hydrocephalus or extra-axial collection. Vascular: No hyperdense vessel. Skull: Negative for fracture or focal lesion. Sinuses/Orbits: Mucosal thickening in the ethmoid air cells. Mucous retention cyst in the left frontal sinus. Other: The mastoid air cells are well aerated. ASPECTS [facility] Stroke Program Early CT Score) - Ganglionic level infarction (caudate, lentiform nuclei, internal capsule, insula, M1-M3 cortex): 7 - Supraganglionic infarction (M4-M6 cortex): 3 Total score (0-10 with 10 being normal): 10 IMPRESSION: No acute intracranial process. ASPECTS is 10. Code stroke imaging results were communicated on 09/30/2023 at 4:33 pm to provider LOCKWOOD via telephone, who verbally acknowledged these results. Electronically Signed   By: Wiliam Ke M.D.   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 He had moderate LVH, normal RV function.  No known history of previous genetic abnormalities or cardiomyopathies.  Asymptomatic/euvolemic and does not demonstrate any heart failure symptoms.  Currently being treated with chemotherapy for CLL, these medications do not seem to be attributed with chemotherapy induced cardiomyopathy.  He would like to leave today, further titration of his GDMT and imaging can be done outpatient. Will start him on losartan 50 mg daily, carvedilol 3.125 mg twice daily.  EF may be low normal.  If read to be low on cardiac  MRI consider adding SGLT2 inhibitor. Plan for outpatient cardiac MRI.    Hypertension Uncontrolled.  Has been as high as 190.  Generally around 160.  Continue above medications.  TIA Resolved symptoms.  Aspirin and Plavix per neurology for 3 weeks  Hyperlipidemia LDL 94.  Started on atorvastatin 10 mg daily.  CLL on chemo Currently on zanubrutinib    Risk Assessment/Risk Scores:   New York Heart Association (NYHA) Functional Class NYHA Class I        For questions or updates, please contact [facility] Please consult [email or web address] for contact info under    Signed, Abagail Kitchens, PA-C  10/02/2023 7:58 AM

## 2023-10-02 NOTE — Care Management Obs Status (Signed)
MEDICARE OBSERVATION STATUS NOTIFICATION   Patient Details  Name: Cody [name] MRN: [id number] Date of Birth: June 22, 1956   Medicare Observation Status Notification Given:  Yes    Otelia Santee, LCSW 10/02/2023, 11:54 AM

## 2023-10-02 NOTE — Discharge Summary (Addendum)
Physician Discharge Summary   Patient: Cody [name] MRN: [id number] DOB: [birth year]  Admit date:     09/30/2023  Discharge date: 10/02/23  Discharge Physician: Thad Ranger, MD    PCP: Mirna Mires, MD   Recommendations at discharge:    Continue ASA 81mg  and plavix 75mg  x 3 weeks, then continue aspirin alone indefinitely  Lipitor increased to 20mg  daily Started on coreg 3.125mg  BID, Losartan 50mg  daily   Discharge Diagnoses:    TIA (transient ischemic attack)   Newly diagnosed HFrEF   Chronic lymphoid leukemia (HCC)   Uncontrolled Hypertension hyperlipidemia   Abnormal EKG   Marijuana abuse   Type 2 diabetes mellitus (HCC)   HLD (hyperlipidemia)   Hospital Course:  Patient is a 67 year old male with CLL, DM type II, HTN, HLP, alcohol abuse in remission, cirrhosis, gout presented to ED for right leg weakness and dysarthria.  Last known well at 9:30 PM on 11/5.  Patient is a poor historian, stays at home with his mother.  Per sister, a day before the admission he went to bed around 9 PM and appeared normal.  On the day of admission on 11/6, she noted on the camera that he had urinated while sitting in a chair and was dragging his right leg, speech was slurred.  Symptoms resolved after patient was brought to ED.  No prior history of stroke.  A month ago, patient had complained of chest pain and was seen by his oncologist and thought to be related to cancer treatment. Patient was admitted for TIA/stroke workup.  Assessment and Plan:   TIA (transient ischemic attack) -Out of tPA window at the time of arrival, presented with dysarthria, RLE weakness, symptoms have resolved. -CT head negative for acute intracranial abnormality, brain MRI negative for acute CVA -MRA head and neck normal -Lipid panel showed HDL 36, LDL 94, continue Lipitor -Patient was placed on aspirin 81 mg daily, received loading dose of Plavix and placed on Plavix 75 mg daily x 3 weeks, then aspirin alone  indefinitely   -PT OT evaluation -> HHPT  --Echo showed EF 40-45%, global hypokinesis, G1DD. Discussed with sister, [name], who mentioned that he had 2 episodes of chest pain after starting the new chemo drug for CLL. No prior cardiac work-up or echo to compare.  - evaluated by cardiology  - outpatient follow-up with Dr Pearlean Brownie.    Abnormal EKG, Newly diagnosed HFrEF -EKG showed TWI inferior laterally, appears new, troponin normal 14 -Echo showedEF 40-45%, global hypokinesis, G1DD. - evaluated by cardiology, recommended  losartan 50 mg daily, carvedilol 3.125 mg twice daily, hydrochlorothiazide 25mg  daily.  EF low normal, plan for outpatient cardiac MRI - outpatient follow-up with Dr Wyline Mood will be arranged by office      Chronic lymphoid leukemia (HCC) - Previously treated with rituximab which was discontinued due to poor tolerance. Currently on Zanubrutinib.  - WBC count 58., improving from 68K on 10/11 and 95.6 on 9/13 -H&H stable, platelets improving -  Outpatient oncology follow-up.    Marijuana abuse UDS positive for THC.  Counsel to quit.   Type 2 diabetes Last A1c 6.2 in January 2024 -Hemoglobin A1c improving, 5.9, continue sliding scale insulin, metformin    Hypertension - Started on coreg, losartan    Hyperlipidemia Continue Lipitor.   Chronic gout Continue allopurinol.   Estimated body mass index is 26.31 kg/m as calculated from the following:   Height as of this encounter: 6' (1.829 m).   Weight as of this  encounter: 88 kg.      Pain control - [employer] Controlled Substance Reporting System database was reviewed. and patient was instructed, not to drive, operate heavy machinery, perform activities at heights, swimming or participation in water activities or provide baby-sitting services while on Pain, Sleep and Anxiety Medications; until their outpatient Physician has advised to do so again. Also recommended to not to take more than prescribed  Pain, Sleep and Anxiety Medications.  Consultants: neurology, cardiology Procedures performed: echo, MRI brain, MRA head and neck.  Disposition: Home Diet recommendation:  Discharge Diet Orders (From admission, onward)     Start     Ordered   10/02/23 0000  Diet Carb Modified        10/02/23 1208           DISCHARGE MEDICATION: Allergies as of 10/02/2023       Reactions   Rituxan [rituximab] Other (See Comments)   "Shaking all over and diaphoretic"        Medication List     STOP taking these medications    amLODipine 5 MG tablet Commonly known as: NORVASC   ondansetron 4 MG tablet Commonly known as: ZOFRAN   telmisartan 80 MG tablet Commonly known as: MICARDIS       TAKE these medications    Accu-Chek Guide w/Device Kit   allopurinol 300 MG tablet Commonly known as: ZYLOPRIM TAKE 1 TABLET DAILY What changed: when to take this   aspirin EC 81 MG tablet Take 1 tablet (81 mg total) by mouth daily. Swallow whole. Start taking on: October 03, 2023   atorvastatin 20 MG tablet Commonly known as: LIPITOR Take 1 tablet (20 mg total) by mouth at bedtime. What changed:  medication strength how much to take   B-D UF III MINI PEN NEEDLES 31G X 5 MM Misc Generic drug: Insulin Pen Needle   Brukinsa 80 MG capsule Generic drug: zanubrutinib Take 2 capsules (160 mg total) by mouth 2 (two) times daily.   carvedilol 3.125 MG tablet Commonly known as: COREG Take 1 tablet (3.125 mg total) by mouth 2 (two) times daily.   clopidogrel 75 MG tablet Commonly known as: PLAVIX Take 1 tablet (75 mg total) by mouth daily for 21 days. Continue for 3 weeks, then stop Start taking on: October 03, 2023   dapagliflozin propanediol 10 MG Tabs tablet Commonly known as: Farxiga Take 1 tablet (10 mg total) by mouth daily.   fexofenadine 180 MG tablet Commonly known as: ALLEGRA Take 180 mg by mouth at bedtime.   hydrochlorothiazide 25 MG tablet Commonly known as:  HYDRODIURIL Take 25 mg by mouth daily.   losartan 50 MG tablet Commonly known as: COZAAR Take 1 tablet (50 mg total) by mouth daily. Start taking on: October 03, 2023   metFORMIN 500 MG 24 hr tablet Commonly known as: GLUCOPHAGE-XR Take 1 tablet (500 mg total) by mouth 2 (two) times daily with a meal. Take 500 mg by mouth in the morning and 1,000 mg at bedtime What changed:  how much to take when to take this   OneTouch Delica Lancets Fine Misc 3 (three) times daily.   Toujeo SoloStar 300 UNIT/ML Solostar Pen Generic drug: insulin glargine (1 Unit Dial) Inject 0-8 Units into the skin See admin instructions. Inject 0-8 units into the skin in the morning and at bedtime, PER SLIDING SCALE: BGL: 0-99 = give nothing; 100-150 = 4 units; 151-200 = 6 units; 201-250 = 8 units  Follow-up Information     Maisie Fus, MD Follow up.   Specialty: Cardiology Why: We will call you to schedule appointment after your cardiac MRI has been scheduled. Contact information: [address] [place] [address] [phone]         [facility] Follow up.   Specialty: Home Health Services Why: Frances Furbish will follow up with you at discharge to provide home health services Contact information: [address] [place] [address] [phone]         Micki Riley, MD. Schedule an appointment as soon as possible for a visit in 4 week(s).   Specialties: Neurology, Radiology Why: for hospital follow-up Contact information: [address] [place] [address] [phone]         Mirna Mires, MD. Schedule an appointment as soon as possible for a visit in 2 week(s).   Specialty: Family Medicine Why: for hospital follow-up Contact information: [address] [place] [address] [phone]                Discharge Exam: Ceasar Mons Weights   09/30/23 1351  Weight: 88 kg   S: No acute complaints, wants to go  home.    BP (!) 163/70 (BP Location: Right Arm)   Pulse (!) 58   Temp 97.7 F (36.5 C)   Resp 16   Ht 6' (1.829 m)   Wt 88 kg   SpO2 99%   BMI 26.31 kg/m    Physical Exam General: Alert and oriented x 3, NAD Cardiovascular: S1 S2 clear, RRR.  Respiratory: CTAB, no wheezing Gastrointestinal: Soft, nontender, nondistended, NBS Ext: no pedal edema bilaterally Neuro: no new deficits Psych: Normal affect   Condition at discharge: fair  The results of significant diagnostics from this hospitalization (including imaging, microbiology, ancillary and laboratory) are listed below for reference.   Imaging Studies: ECHOCARDIOGRAM COMPLETE  Result Date: 10/01/2023    ECHOCARDIOGRAM REPORT   Patient Name:   Cody [name] Date of Exam: 10/01/2023 Medical Rec #:  [id number]             Height:       72.0 in Accession #:    [id number]            Weight:       194.0 lb Date of Birth:  July 31, 1956              BSA:          2.103 m Patient Age:    67 years              BP:           186/83 mmHg Patient Gender: M                     HR:           63 bpm. Exam Location:  Inpatient Procedure: 2D Echo, Cardiac Doppler and Color Doppler Indications:    TIA  History:        Patient has no prior history of Echocardiogram examinations.                 TIA; Risk Factors:Hypertension, Diabetes and Dyslipidemia.  Sonographer:    Karma Ganja Referring Phys: [id number] VASUNDHRA RATHORE IMPRESSIONS  1. 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 Left ventricular ejection fraction, by estimation, is 40 to 45%. The left ventricle has mildly decreased  function. The left ventricle demonstrates global hypokinesis.  There is moderate left ventricular hypertrophy. Left ventricular diastolic parameters are consistent with Grade I diastolic dysfunction (impaired relaxation).  2. Right ventricular systolic function is normal. The right ventricular size is normal. Tricuspid regurgitation  signal is inadequate for assessing PA pressure.  3. The mitral valve is normal in structure. Trivial mitral valve regurgitation. No evidence of mitral stenosis.  4. The aortic valve is tricuspid. Aortic valve regurgitation is trivial. No aortic stenosis is present.  5. The inferior vena cava is dilated in size with >50% respiratory variability, suggesting right atrial pressure of 8 mmHg. 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 Left ventricular ejection fraction, by estimation, is 40 to 45%. The left ventricle has mildly decreased function. The left ventricle demonstrates global  hypokinesis. The left ventricular internal cavity size was normal in size. There is moderate left ventricular hypertrophy. Left ventricular diastolic parameters are consistent with Grade I diastolic dysfunction (impaired relaxation). Right Ventricle: The right ventricular size is normal. No increase in right ventricular wall thickness. Right ventricular systolic function is normal. Tricuspid regurgitation signal is inadequate for assessing PA pressure. Left Atrium: Left atrial size was normal in size. Right Atrium: Right atrial size was normal in size. Pericardium: There is no evidence of pericardial effusion. Mitral Valve: The mitral valve is normal in structure. Trivial mitral valve regurgitation. No evidence of mitral valve stenosis. Tricuspid Valve: The tricuspid valve is normal in structure. Tricuspid valve regurgitation is not demonstrated. No evidence of tricuspid stenosis. Aortic Valve: The aortic valve is tricuspid. Aortic valve regurgitation is trivial. No aortic stenosis is present. Aortic valve mean gradient measures 3.0 mmHg. Aortic valve peak gradient measures 5.6 mmHg. Aortic valve area, by VTI measures 2.52 cm. Pulmonic Valve: The pulmonic valve was normal in structure. Pulmonic valve regurgitation is trivial. No evidence of pulmonic stenosis. Aorta: The  aortic root is normal in size and structure. Venous: The inferior vena cava is dilated in size with greater than 50% respiratory variability, suggesting right atrial pressure of 8 mmHg. IAS/Shunts: No atrial level shunt detected by color flow Doppler.  LEFT VENTRICLE PLAX 2D LVIDd:         5.30 cm      Diastology LVIDs:         4.20 cm      LV e' medial:    4.13 cm/s LV PW:         1.30 cm      LV E/e' medial:  10.0 LV IVS:        1.70 cm      LV e' lateral:   7.83 cm/s LVOT diam:     2.20 cm      LV E/e' lateral: 5.2 LV SV:         67 LV SV Index:   32 LVOT Area:     3.80 cm  LV Volumes (MOD) LV vol d, MOD A2C: 143.0 ml LV vol d, MOD A4C: 149.0 ml LV vol s, MOD A2C: 82.3 ml LV vol s, MOD A4C: 87.2 ml LV SV MOD A2C:     60.7 ml LV SV MOD A4C:     149.0 ml LV SV MOD BP:      64.1 ml RIGHT VENTRICLE             IVC RV Basal diam:  4.00 cm     IVC diam: 2.10 cm RV S prime:  11.50 cm/s TAPSE (M-mode): 3.0 cm LEFT ATRIUM              Index        RIGHT ATRIUM           Index LA diam:        3.60 cm  1.71 cm/m   RA Area:     25.20 cm LA Vol (A2C):   131.0 ml 62.29 ml/m  RA Volume:   93.50 ml  44.46 ml/m LA Vol (A4C):   54.9 ml  26.10 ml/m LA Biplane Vol: 89.2 ml  42.41 ml/m  AORTIC VALVE AV Area (Vmax):    2.90 cm AV Area (Vmean):   2.38 cm AV Area (VTI):     2.52 cm AV Vmax:           118.00 cm/s AV Vmean:          89.100 cm/s AV VTI:            0.266 m AV Peak Grad:      5.6 mmHg AV Mean Grad:      3.0 mmHg LVOT Vmax:         90.10 cm/s LVOT Vmean:        55.800 cm/s LVOT VTI:          0.176 m LVOT/AV VTI ratio: 0.66  AORTA Ao Root diam: 3.60 cm Ao Asc diam:  3.40 cm MITRAL VALVE MV Area (PHT): 2.60 cm    SHUNTS MV Decel Time: 292 msec    Systemic VTI:  0.18 m MV E velocity: 41.10 cm/s  Systemic Diam: 2.20 cm MV A velocity: 52.30 cm/s MV E/A ratio:  0.79 Weston Brass MD Electronically signed by Weston Brass MD Signature Date/Time: 10/01/2023/4:17:07 PM    Final    MR BRAIN WO CONTRAST  Result  Date: 09/30/2023 CLINICAL DATA:  Altered mental status EXAM: MRI HEAD WITHOUT CONTRAST MRA HEAD WITHOUT CONTRAST MRA NECK WITHOUT CONTRAST TECHNIQUE: Multiplanar, multiecho pulse sequences of the brain and surrounding structures were obtained without intravenous contrast. Angiographic images of the Circle of Willis were obtained using MRA technique without intravenous contrast. Angiographic images of the neck were obtained using MRA technique without intravenous contrast. Carotid stenosis measurements (when applicable) are obtained utilizing NASCET criteria, using the distal internal carotid diameter as the denominator. COMPARISON:  None Available. FINDINGS: MRI HEAD FINDINGS Brain: No acute infarct, mass effect or extra-axial collection. No acute or chronic hemorrhage. Mild volume loss. The midline structures are normal. Vascular: Normal flow voids. Skull and upper cervical spine: Normal calvarium and skull base. Visualized upper cervical spine and soft tissues are normal. Sinuses/Orbits:Left maxillary sinus retention cyst.  Normal orbits. MRA HEAD FINDINGS POSTERIOR CIRCULATION: --Vertebral arteries: Normal --Inferior cerebellar arteries: Normal. --Basilar artery: Normal. --Superior cerebellar arteries: Normal. --Posterior cerebral arteries: Normal. Both posterior communicating arteries are patent. ANTERIOR CIRCULATION: --Intracranial internal carotid arteries: Normal. --Anterior cerebral arteries (ACA): Normal. --Middle cerebral arteries (MCA): Normal. MRA NECK FINDINGS No dissection, occlusion or hemodynamically significant stenosis of the carotid or vertebral arteries. The vertebral system is mildly right dominant. IMPRESSION: 1. No acute intracranial abnormality. 2. Normal intracranial MRA. 3. Normal MRA of the neck. Electronically Signed   By: Deatra Robinson M.D.   On: 09/30/2023 19:35   MR ANGIO HEAD WO CONTRAST  Result Date: 09/30/2023 CLINICAL DATA:  Altered mental status EXAM: MRI HEAD WITHOUT CONTRAST  MRA HEAD WITHOUT CONTRAST MRA NECK WITHOUT CONTRAST TECHNIQUE: Multiplanar, multiecho pulse sequences of the brain and  surrounding structures were obtained without intravenous contrast. Angiographic images of the Circle of Willis were obtained using MRA technique without intravenous contrast. Angiographic images of the neck were obtained using MRA technique without intravenous contrast. Carotid stenosis measurements (when applicable) are obtained utilizing NASCET criteria, using the distal internal carotid diameter as the denominator. COMPARISON:  None Available. FINDINGS: MRI HEAD FINDINGS Brain: No acute infarct, mass effect or extra-axial collection. No acute or chronic hemorrhage. Mild volume loss. The midline structures are normal. Vascular: Normal flow voids. Skull and upper cervical spine: Normal calvarium and skull base. Visualized upper cervical spine and soft tissues are normal. Sinuses/Orbits:Left maxillary sinus retention cyst.  Normal orbits. MRA HEAD FINDINGS POSTERIOR CIRCULATION: --Vertebral arteries: Normal --Inferior cerebellar arteries: Normal. --Basilar artery: Normal. --Superior cerebellar arteries: Normal. --Posterior cerebral arteries: Normal. Both posterior communicating arteries are patent. ANTERIOR CIRCULATION: --Intracranial internal carotid arteries: Normal. --Anterior cerebral arteries (ACA): Normal. --Middle cerebral arteries (MCA): Normal. MRA NECK FINDINGS No dissection, occlusion or hemodynamically significant stenosis of the carotid or vertebral arteries. The vertebral system is mildly right dominant. IMPRESSION: 1. No acute intracranial abnormality. 2. Normal intracranial MRA. 3. Normal MRA of the neck. Electronically Signed   By: Deatra Robinson M.D.   On: 09/30/2023 19:35   MR ANGIO NECK WO CONTRAST  Result Date: 09/30/2023 CLINICAL DATA:  Altered mental status EXAM: MRI HEAD WITHOUT CONTRAST MRA HEAD WITHOUT CONTRAST MRA NECK WITHOUT CONTRAST TECHNIQUE: Multiplanar, multiecho  pulse sequences of the brain and surrounding structures were obtained without intravenous contrast. Angiographic images of the Circle of Willis were obtained using MRA technique without intravenous contrast. Angiographic images of the neck were obtained using MRA technique without intravenous contrast. Carotid stenosis measurements (when applicable) are obtained utilizing NASCET criteria, using the distal internal carotid diameter as the denominator. COMPARISON:  None Available. FINDINGS: MRI HEAD FINDINGS Brain: No acute infarct, mass effect or extra-axial collection. No acute or chronic hemorrhage. Mild volume loss. The midline structures are normal. Vascular: Normal flow voids. Skull and upper cervical spine: Normal calvarium and skull base. Visualized upper cervical spine and soft tissues are normal. Sinuses/Orbits:Left maxillary sinus retention cyst.  Normal orbits. MRA HEAD FINDINGS POSTERIOR CIRCULATION: --Vertebral arteries: Normal --Inferior cerebellar arteries: Normal. --Basilar artery: Normal. --Superior cerebellar arteries: Normal. --Posterior cerebral arteries: Normal. Both posterior communicating arteries are patent. ANTERIOR CIRCULATION: --Intracranial internal carotid arteries: Normal. --Anterior cerebral arteries (ACA): Normal. --Middle cerebral arteries (MCA): Normal. MRA NECK FINDINGS No dissection, occlusion or hemodynamically significant stenosis of the carotid or vertebral arteries. The vertebral system is mildly right dominant. IMPRESSION: 1. No acute intracranial abnormality. 2. Normal intracranial MRA. 3. Normal MRA of the neck. Electronically Signed   By: Deatra Robinson M.D.   On: 09/30/2023 19:35   CT HEAD WO CONTRAST  Result Date: 09/30/2023 CLINICAL DATA:  Mental status change, unknown cause, stroke suspected EXAM: CT HEAD WITHOUT CONTRAST TECHNIQUE: Contiguous axial images were obtained from the base of the skull through the vertex without intravenous contrast. RADIATION DOSE  REDUCTION: This exam was performed according to the departmental dose-optimization program which includes automated exposure control, adjustment of the mA and/or kV according to patient size and/or use of iterative reconstruction technique. COMPARISON:  None Available. FINDINGS: Evaluation is somewhat limited by motion artifact. Brain: No evidence of acute infarction, hemorrhage, mass, mass effect, or midline shift. No hydrocephalus or extra-axial collection. Vascular: No hyperdense vessel. Skull: Negative for fracture or focal lesion. Sinuses/Orbits: Mucosal thickening in the ethmoid air cells. Mucous retention cyst in the  left frontal sinus. Other: The mastoid air cells are well aerated. ASPECTS [facility] Stroke Program Early CT Score) - Ganglionic level infarction (caudate, lentiform nuclei, internal capsule, insula, M1-M3 cortex): 7 - Supraganglionic infarction (M4-M6 cortex): 3 Total score (0-10 with 10 being normal): 10 IMPRESSION: No acute intracranial process. ASPECTS is 10. Code stroke imaging results were communicated on 09/30/2023 at 4:33 pm to provider LOCKWOOD via telephone, who verbally acknowledged these results. Electronically Signed   By: Wiliam Ke M.D.   On: 09/30/2023 16:41    Microbiology: Results for orders placed or performed during the hospital encounter of 12/04/22  MRSA Next Gen by PCR, Nasal     Status: None   Collection Time: 12/04/22  8:31 AM   Specimen: Nasal Mucosa; Nasal Swab  Result Value Ref Range Status   MRSA by PCR Next Gen NOT DETECTED NOT DETECTED Final    Comment: (NOTE) The GeneXpert MRSA Assay (FDA approved for NASAL specimens only), is one component of a comprehensive MRSA colonization surveillance program. It is not intended to diagnose MRSA infection nor to guide or monitor treatment for MRSA infections. Test performance is not FDA approved in patients less than 58 years old. Performed at [facility], [address], [place],  [place] [address]   Resp panel by RT-PCR (RSV, Flu A&B, Covid) Anterior Nasal Swab     Status: None   Collection Time: 12/04/22  9:03 AM   Specimen: Anterior Nasal Swab  Result Value Ref Range Status   SARS Coronavirus 2 by RT PCR NEGATIVE NEGATIVE Final    Comment: (NOTE) SARS-CoV-2 target nucleic acids are NOT DETECTED.  The SARS-CoV-2 RNA is generally detectable in upper respiratory specimens during the acute phase of infection. The lowest concentration of SARS-CoV-2 viral copies this assay can detect is 138 copies/mL. A negative result does not preclude SARS-Cov-2 infection and should not be used as the sole basis for treatment or other patient management decisions. A negative result may occur with  improper specimen collection/handling, submission of specimen other than nasopharyngeal swab, presence of viral mutation(s) within the areas targeted by this assay, and inadequate number of viral copies(<138 copies/mL). A negative result must be combined with clinical observations, patient history, and epidemiological information. The expected result is Negative.  Fact Sheet for Patients:  BloggerCourse.com  Fact Sheet for Healthcare Providers:  SeriousBroker.it  This test is no t yet approved or cleared by the [place] FDA and  has been authorized for detection and/or diagnosis of SARS-CoV-2 by FDA under an Emergency Use Authorization (EUA). This EUA will remain  in effect (meaning this test can be used) for the duration of the COVID-19 declaration under Section 564(b)(1) of the Act, 21 U.S.C.section 360bbb-3(b)(1), unless the authorization is terminated  or revoked sooner.       Influenza A by PCR NEGATIVE NEGATIVE Final   Influenza B by PCR NEGATIVE NEGATIVE Final    Comment: (NOTE) The Xpert Xpress SARS-CoV-2/FLU/RSV plus assay is intended as an aid in the diagnosis of influenza from Nasopharyngeal swab specimens and should  not be used as a sole basis for treatment. Nasal washings and aspirates are unacceptable for Xpert Xpress SARS-CoV-2/FLU/RSV testing.  Fact Sheet for Patients: BloggerCourse.com  Fact Sheet for Healthcare Providers: SeriousBroker.it  This test is not yet approved or cleared by the [place] FDA and has been authorized for detection and/or diagnosis of SARS-CoV-2 by FDA under an Emergency Use Authorization (EUA). This EUA will remain in effect (meaning this  test can be used) for the duration of the COVID-19 declaration under Section 564(b)(1) of the Act, 21 U.S.C. section 360bbb-3(b)(1), unless the authorization is terminated or revoked.     Resp Syncytial Virus by PCR NEGATIVE NEGATIVE Final    Comment: (NOTE) Fact Sheet for Patients: BloggerCourse.com  Fact Sheet for Healthcare Providers: SeriousBroker.it  This test is not yet approved or cleared by the [place] FDA and has been authorized for detection and/or diagnosis of SARS-CoV-2 by FDA under an Emergency Use Authorization (EUA). This EUA will remain in effect (meaning this test can be used) for the duration of the COVID-19 declaration under Section 564(b)(1) of the Act, 21 U.S.C. section 360bbb-3(b)(1), unless the authorization is terminated or revoked.  Performed at [facility], [address], [place], [place] [address]   Blood Culture (routine x 2)     Status: None   Collection Time: 12/04/22  9:10 AM   Specimen: BLOOD  Result Value Ref Range Status   Specimen Description   Final    BLOOD SITE NOT SPECIFIED Performed at [facility], [address] [place], [place] [address]    Special Requests   Final    BOTTLES DRAWN AEROBIC AND ANAEROBIC Blood Culture adequate volume Performed at [facility], [address], [place], [place] [address]     Culture   Final    NO GROWTH 5 DAYS Performed at [facility], [address], [place], [place] [address]    Report Status 12/09/2022 FINAL  Final  Blood Culture (routine x 2)     Status: None   Collection Time: 12/04/22  9:15 AM   Specimen: BLOOD  Result Value Ref Range Status   Specimen Description   Final    BLOOD SITE NOT SPECIFIED Performed at [facility], [address], [place], [place] [address]    Special Requests   Final    BOTTLES DRAWN AEROBIC AND ANAEROBIC Blood Culture adequate volume Performed at [facility], [address], [place], [place] [address]    Culture   Final    NO GROWTH 5 DAYS Performed at [facility], [address] [place], [place] [address]    Report Status 12/09/2022 FINAL  Final  Urine Culture     Status: None   Collection Time: 12/04/22  2:49 PM   Specimen: In/Out Cath Urine  Result Value Ref Range Status   Specimen Description   Final    IN/OUT CATH URINE Performed at [facility], [address], [place], [place] [address]    Special Requests   Final    NONE Performed at [facility], [address], [place], [place] [address]    Culture   Final    NO GROWTH Performed at  [facility], [address] [place], [place] [address]    Report Status 12/05/2022 FINAL  Final    Labs: CBC: Recent Labs  Lab 09/30/23 1410 09/30/23 1429 10/01/23 0520  WBC 58.1*  --  58.4*  NEUTROABS 3.6  --  3.5  HGB 10.4* 12.2* 11.7*  HCT 35.7* 36.0* 39.7  MCV 89.3  --  88.8  PLT 108*  --  114*   Basic Metabolic Panel: Recent Labs  Lab 09/30/23 1410 09/30/23 1429 10/01/23 0520  NA 140 142 137  K 4.8 4.6 4.6  CL 107 104 104  CO2 26  --  25  GLUCOSE 83  81 103*  BUN 27* 28* 28*  CREATININE 1.10 1.30* 1.10  CALCIUM 8.8*  --  8.8*   Liver Function Tests: Recent Labs  Lab 09/30/23 1410  AST 27  ALT 22  ALKPHOS 62  BILITOT 1.0  PROT 7.2   ALBUMIN 4.2   CBG: Recent Labs  Lab 10/01/23 0759 10/01/23 1211 10/01/23 1653 10/01/23 2128 10/02/23 0807  GLUCAP 80 108* 114* 102* 87    Discharge time spent: greater than 30 minutes.  Signed: Thad Ranger, MD [facility] 10/02/2023

## 2023-10-02 NOTE — Progress Notes (Signed)
Physical Therapy Treatment Patient Details Name: Cody [name] MRN: [id number] DOB: Feb 19, 1956 Today's Date: 10/02/2023   History of Present Illness Cody [name] is a 67 y.o. male who presented to the ED as code stroke for evaluation of right leg weakness and dysarthria. MRI (-) for CVA. PMH:  CLL, type 2 diabetes, hypertension, hyperlipidemia, history of alcohol abuse in remission, alcoholic cirrhosis, gout.    PT Comments  Pt is mobilizing well, he ambulated 220' without an assistive device, no loss of balance. Pt pulled out his IV while walking and had some bleeding, RN applied a dressing. He is ready to DC home from a PT standpoint.     If plan is discharge home, recommend the following: Assistance with cooking/housework;Assist for transportation   Can travel by private vehicle        Equipment Recommendations  None recommended by PT    Recommendations for Other Services       Precautions / Restrictions Precautions Precautions: Fall Precaution Comments: denies falls in past 6 months Restrictions Weight Bearing Restrictions: No     Mobility  Bed Mobility               General bed mobility comments: up walking in room    Transfers                   General transfer comment: NT    Ambulation/Gait Ambulation/Gait assistance: Independent Gait Distance (Feet): 220 Feet Assistive device: None Gait Pattern/deviations: WFL(Within Functional Limits) Gait velocity: WFL     General Gait Details: steady, no loss of balance   Stairs             Wheelchair Mobility     Tilt Bed    Modified Rankin (Stroke Patients Only)       Balance Overall balance assessment: Mild deficits observed, not formally tested                                          Cognition Arousal: Alert Behavior During Therapy: WFL for tasks assessed/performed, Restless Overall Cognitive Status: History of cognitive impairments - at  baseline                                 General Comments: repeats questions        Exercises      General Comments        Pertinent Vitals/Pain Pain Assessment Pain Assessment: No/denies pain    Home Living                          Prior Function            PT Goals (current goals can now be found in the care plan section) Acute Rehab PT Goals Patient Stated Goal: go home PT Goal Formulation: With patient Time For Goal Achievement: 10/15/23 Potential to Achieve Goals: Good Progress towards PT goals: Goals met/education completed, patient discharged from PT    Frequency    Min 1X/week      PT Plan      Co-evaluation              AM-PAC PT "6 Clicks" Mobility   Outcome Measure  Help needed turning from your back to your side while in a flat bed  without using bedrails?: None Help needed moving from lying on your back to sitting on the side of a flat bed without using bedrails?: None Help needed moving to and from a bed to a chair (including a wheelchair)?: None Help needed standing up from a chair using your arms (e.g., wheelchair or bedside chair)?: None Help needed to walk in hospital room?: None Help needed climbing 3-5 steps with a railing? : None 6 Click Score: 24    End of Session   Activity Tolerance: Patient tolerated treatment well Patient left: with call bell/phone within reach;in chair;with nursing/sitter in room Nurse Communication: Mobility status PT Visit Diagnosis: Unsteadiness on feet (R26.81)     Time: 1201-1209 PT Time Calculation (min) (ACUTE ONLY): 8 min  Charges:    $Gait Training: 8-22 mins PT General Charges $$ ACUTE PT VISIT: 1 Visit                     Tamala Ser PT 10/02/2023  Acute Rehabilitation Services  Office [phone]

## 2023-10-02 NOTE — TOC Initial Note (Signed)
Transition of Care [facility]) - Initial/Assessment Note    Patient Details  Name: Cody [name] MRN: [id number] Date of Birth: 08-15-1956  Transition of Care [facility]) CM/SW Contact:    Otelia Santee, LCSW Phone Number: 10/02/2023, 12:07 PM  Clinical Narrative:                 Pt lives in sisters house with his mother. Spoke with pt's sister via t/c who shares pt has not had HH services in the past but, she is agreeable for [facility] PT/OT to be arranged. Pt's sister does not have a preference for [facility]. She also declines any DME needs.  HHPT/OT has been arranged with [facility]. HH orders will need to be placed prior to discharge. Pt's sister to provide transportation for pt at discharge.   Expected Discharge Plan: Home w Home Health Services Barriers to Discharge: Continued Medical Work up   Patient Goals and CMS Choice Patient states their goals for this hospitalization and ongoing recovery are:: For pt to return home CMS Medicare.gov Compare Post Acute Care list provided to:: Patient Represenative (must comment) Choice offered to / list presented to : Sibling [place] ownership interest in [facility].provided to:: Sibling    Expected Discharge Plan and Services In-house Referral: Clinical Social Work   Post Acute Care Choice: Home Health Living arrangements for the past 2 months: Single Family Home                 DME Arranged: N/A DME Agency: NA       HH Arranged: PT, OT HH AgencyHotel manager Home Health Care Date HH Agency Contacted: 10/02/23 Time HH Agency Contacted: 1206 Representative spoke with at [facility] Agency: Cindie  Prior Living Arrangements/Services Living arrangements for the past 2 months: Single Family Home Lives with:: Siblings, Parents Patient language and need for interpreter reviewed:: Yes Do you feel safe going back to the place where you live?: Yes      Need for Family Participation in Patient Care: Yes (Comment) Care giver support system in place?:  Yes (comment) Current home services: DME Criminal Activity/Legal Involvement Pertinent to Current Situation/Hospitalization: No - Comment as needed  Activities of Daily Living   ADL Screening (condition at time of admission) Independently performs ADLs?: No Does the patient have a NEW difficulty with bathing/dressing/toileting/self-feeding that is expected to last >3 days?: No Does the patient have a NEW difficulty with getting in/out of bed, walking, or climbing stairs that is expected to last >3 days?: No Does the patient have a NEW difficulty with communication that is expected to last >3 days?: No Is the patient deaf or have difficulty hearing?: No Does the patient have difficulty seeing, even when wearing glasses/contacts?: No Does the patient have difficulty concentrating, remembering, or making decisions?: No  Permission Sought/Granted Permission sought to share information with : Family Supports, Oceanographer granted to share information with : Yes, Verbal Permission Granted  Share Information with NAME: Sriyan Terp  Permission granted to share info w AGENCY: [facility]  Permission granted to share info w Relationship: Sister     Emotional Assessment   Attitude/Demeanor/Rapport: Unable to Assess Affect (typically observed): Unable to Assess Orientation: : Oriented to Self, Oriented to  Time Alcohol / Substance Use: Not Applicable Psych Involvement: No (comment)  Admission diagnosis:  TIA (transient ischemic attack) [G45.9] Dysarthria [R47.1] Right sided weakness [R53.1] Patient Active Problem List   Diagnosis Date Noted   TIA (transient ischemic attack) 09/30/2023  Abnormal EKG 09/30/2023   Marijuana abuse 09/30/2023   Type 2 diabetes mellitus (HCC) 09/30/2023   HLD (hyperlipidemia) 09/30/2023   Callus 08/20/2023   Malnutrition of moderate degree 12/07/2022   HCAP (healthcare-associated pneumonia) 12/04/2022   AKI (acute kidney injury)  (HCC) 12/04/2022   Hyperkalemia 12/04/2022   Hypocalcemia 12/04/2022   Pain due to onychomycosis of toenails of both feet 10/17/2020   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 [place] [address] Phone: [phone] Fax: [phone]  [id number] - [place], [place] - [address] [place] [address] Phone: [phone] Fax: [phone]     Social Determinants of Health (SDOH) Social History: SDOH Screenings   Food Insecurity: No Food Insecurity (10/01/2023)  Housing: Low Risk  (10/01/2023)  Transportation Needs: No Transportation Needs (10/01/2023)  Utilities: Not At Risk (10/01/2023)  Tobacco Use: Low Risk  (09/30/2023)   SDOH Interventions:     Readmission Risk Interventions     No data to display

## 2023-10-02 NOTE — Progress Notes (Signed)
Clinical Intervention Note  Clinical Intervention Notes: Patient is currenlty in the hospital and has started new medications. Patient's sister called to confirm these new medications would not interact with Brukinsa. We dicussed some of the IP medications were substited for his home medications. Recommended that Sister reviews discharge paper work and confirm with PCP which medications should continue once home. Sister expressed understanding and appreication.   Clinical Intervention Outcomes: Prevention of an adverse drug event   Bobette Mo Specialty Pharmacist

## 2023-10-02 NOTE — Telephone Encounter (Signed)
Hello, if it is okay with primary team he can discharge from our perspective.  Can you help me arrange a cardiac MRI for this patient and schedule a follow-up with Dr. Wyline Mood after that?  Not sure of the timing of the cardiac MRIs so I have not scheduled anything.

## 2023-10-06 ENCOUNTER — Other Ambulatory Visit: Payer: Self-pay

## 2023-10-06 NOTE — Progress Notes (Unsigned)
card

## 2023-10-06 NOTE — Telephone Encounter (Signed)
Called spoke with pt's sister. She is aware Cardiac MRI order was placed and they should be in contact with them to set up the appointment. She was made aware she should reach out to the office after to set up a follow up appointment after the MRI is scheduled.

## 2023-10-06 NOTE — Telephone Encounter (Signed)
Message sent to precert for this pt.

## 2023-10-09 ENCOUNTER — Other Ambulatory Visit: Payer: Self-pay

## 2023-10-10 DIAGNOSIS — E7800 Pure hypercholesterolemia, unspecified: Secondary | ICD-10-CM | POA: Diagnosis not present

## 2023-10-10 DIAGNOSIS — I440 Atrioventricular block, first degree: Secondary | ICD-10-CM | POA: Diagnosis not present

## 2023-10-10 DIAGNOSIS — C9510 Chronic leukemia of unspecified cell type not having achieved remission: Secondary | ICD-10-CM | POA: Diagnosis not present

## 2023-10-10 DIAGNOSIS — I110 Hypertensive heart disease with heart failure: Secondary | ICD-10-CM | POA: Diagnosis not present

## 2023-10-10 DIAGNOSIS — M1A9XX0 Chronic gout, unspecified, without tophus (tophi): Secondary | ICD-10-CM | POA: Diagnosis not present

## 2023-10-10 DIAGNOSIS — I5020 Unspecified systolic (congestive) heart failure: Secondary | ICD-10-CM | POA: Diagnosis not present

## 2023-10-10 DIAGNOSIS — R29898 Other symptoms and signs involving the musculoskeletal system: Secondary | ICD-10-CM | POA: Diagnosis not present

## 2023-10-10 DIAGNOSIS — G459 Transient cerebral ischemic attack, unspecified: Secondary | ICD-10-CM | POA: Diagnosis not present

## 2023-10-10 DIAGNOSIS — E1136 Type 2 diabetes mellitus with diabetic cataract: Secondary | ICD-10-CM | POA: Diagnosis not present

## 2023-10-12 ENCOUNTER — Other Ambulatory Visit: Payer: Self-pay

## 2023-10-12 DIAGNOSIS — C9110 Chronic lymphocytic leukemia of B-cell type not having achieved remission: Secondary | ICD-10-CM | POA: Diagnosis not present

## 2023-10-12 DIAGNOSIS — Z0001 Encounter for general adult medical examination with abnormal findings: Secondary | ICD-10-CM | POA: Diagnosis not present

## 2023-10-12 DIAGNOSIS — E7800 Pure hypercholesterolemia, unspecified: Secondary | ICD-10-CM | POA: Diagnosis not present

## 2023-10-12 DIAGNOSIS — I110 Hypertensive heart disease with heart failure: Secondary | ICD-10-CM | POA: Diagnosis not present

## 2023-10-12 DIAGNOSIS — I5020 Unspecified systolic (congestive) heart failure: Secondary | ICD-10-CM | POA: Diagnosis not present

## 2023-10-12 DIAGNOSIS — I440 Atrioventricular block, first degree: Secondary | ICD-10-CM | POA: Diagnosis not present

## 2023-10-12 DIAGNOSIS — F70 Mild intellectual disabilities: Secondary | ICD-10-CM | POA: Diagnosis not present

## 2023-10-12 DIAGNOSIS — E1169 Type 2 diabetes mellitus with other specified complication: Secondary | ICD-10-CM | POA: Diagnosis not present

## 2023-10-12 DIAGNOSIS — R29898 Other symptoms and signs involving the musculoskeletal system: Secondary | ICD-10-CM | POA: Diagnosis not present

## 2023-10-12 DIAGNOSIS — L84 Corns and callosities: Secondary | ICD-10-CM | POA: Diagnosis not present

## 2023-10-12 DIAGNOSIS — M1A9XX0 Chronic gout, unspecified, without tophus (tophi): Secondary | ICD-10-CM | POA: Diagnosis not present

## 2023-10-12 DIAGNOSIS — G459 Transient cerebral ischemic attack, unspecified: Secondary | ICD-10-CM | POA: Diagnosis not present

## 2023-10-12 DIAGNOSIS — E1136 Type 2 diabetes mellitus with diabetic cataract: Secondary | ICD-10-CM | POA: Diagnosis not present

## 2023-10-12 DIAGNOSIS — C9510 Chronic leukemia of unspecified cell type not having achieved remission: Secondary | ICD-10-CM | POA: Diagnosis not present

## 2023-10-12 DIAGNOSIS — I10 Essential (primary) hypertension: Secondary | ICD-10-CM | POA: Diagnosis not present

## 2023-10-12 DIAGNOSIS — N1831 Chronic kidney disease, stage 3a: Secondary | ICD-10-CM | POA: Diagnosis not present

## 2023-10-12 NOTE — Progress Notes (Signed)
Clinical Intervention Note  Clinical Intervention Notes: Confirmed new medications and new doses with sister today. Per Sister, Pt will d/c amlodipine, telmistartin, increase Cozaar, add, ASA, Plavix, and Carvedilol.   Clinical Intervention Outcomes: Improved therapy adherence   Bobette Mo Specialty Pharmacist

## 2023-10-12 NOTE — Progress Notes (Signed)
Specialty Pharmacy Ongoing Clinical Assessment Note  Cody [name] is a 67 y.o. male who is being followed by the specialty pharmacy service for RxSp Oncology   Patient's specialty medication(s) reviewed today: Zanubrutinib   Missed doses in the last 4 weeks: 0   Patient/Caregiver did not have any additional questions or concerns.   Therapeutic benefit summary: Unable to assess   Adverse events/side effects summary: No adverse events/side effects   Patient's therapy is appropriate to: Continue    Goals Addressed             This Visit's Progress    Stabilization of disease       Patient is on track. Patient will maintain adherence         Follow up:  6 months  Bobette Mo Specialty Pharmacist

## 2023-10-12 NOTE — Progress Notes (Signed)
Specialty Pharmacy Refill Coordination Note  Cody [name] is a 67 y.o. male contacted today regarding refills of specialty medication(s) Zanubrutinib   Patient requested Delivery   Delivery date: 10/13/23   Verified address: [address] [place] [address]   Medication will be filled on 10/12/23.

## 2023-10-13 DIAGNOSIS — E7800 Pure hypercholesterolemia, unspecified: Secondary | ICD-10-CM | POA: Diagnosis not present

## 2023-10-13 DIAGNOSIS — I5020 Unspecified systolic (congestive) heart failure: Secondary | ICD-10-CM | POA: Diagnosis not present

## 2023-10-13 DIAGNOSIS — M1A9XX0 Chronic gout, unspecified, without tophus (tophi): Secondary | ICD-10-CM | POA: Diagnosis not present

## 2023-10-13 DIAGNOSIS — I110 Hypertensive heart disease with heart failure: Secondary | ICD-10-CM | POA: Diagnosis not present

## 2023-10-13 DIAGNOSIS — R29898 Other symptoms and signs involving the musculoskeletal system: Secondary | ICD-10-CM | POA: Diagnosis not present

## 2023-10-13 DIAGNOSIS — C9510 Chronic leukemia of unspecified cell type not having achieved remission: Secondary | ICD-10-CM | POA: Diagnosis not present

## 2023-10-13 DIAGNOSIS — G459 Transient cerebral ischemic attack, unspecified: Secondary | ICD-10-CM | POA: Diagnosis not present

## 2023-10-13 DIAGNOSIS — E1136 Type 2 diabetes mellitus with diabetic cataract: Secondary | ICD-10-CM | POA: Diagnosis not present

## 2023-10-13 DIAGNOSIS — I440 Atrioventricular block, first degree: Secondary | ICD-10-CM | POA: Diagnosis not present

## 2023-10-15 ENCOUNTER — Other Ambulatory Visit: Payer: Self-pay

## 2023-10-15 NOTE — Progress Notes (Signed)
Clinical Intervention Note  Clinical Intervention Notes: Patient's sister called to report Billy's "high bp" and wanted to know if he could add Garlic Capsules to his regimen. Per Up to date, no DDI with Garlic and Brukinsa. However, I cautioned staring Garlic capsules prior to talking to Dr. Leonides Schanz. Also counseled that these supplements are not FDA approved.   Clinical Intervention Outcomes: Prevention of an adverse drug event   Bobette Mo Specialty Pharmacist

## 2023-10-16 DIAGNOSIS — M1A9XX0 Chronic gout, unspecified, without tophus (tophi): Secondary | ICD-10-CM | POA: Diagnosis not present

## 2023-10-16 DIAGNOSIS — C9510 Chronic leukemia of unspecified cell type not having achieved remission: Secondary | ICD-10-CM | POA: Diagnosis not present

## 2023-10-16 DIAGNOSIS — I5020 Unspecified systolic (congestive) heart failure: Secondary | ICD-10-CM | POA: Diagnosis not present

## 2023-10-16 DIAGNOSIS — I110 Hypertensive heart disease with heart failure: Secondary | ICD-10-CM | POA: Diagnosis not present

## 2023-10-16 DIAGNOSIS — I440 Atrioventricular block, first degree: Secondary | ICD-10-CM | POA: Diagnosis not present

## 2023-10-16 DIAGNOSIS — E1136 Type 2 diabetes mellitus with diabetic cataract: Secondary | ICD-10-CM | POA: Diagnosis not present

## 2023-10-16 DIAGNOSIS — E7800 Pure hypercholesterolemia, unspecified: Secondary | ICD-10-CM | POA: Diagnosis not present

## 2023-10-16 DIAGNOSIS — R29898 Other symptoms and signs involving the musculoskeletal system: Secondary | ICD-10-CM | POA: Diagnosis not present

## 2023-10-16 DIAGNOSIS — G459 Transient cerebral ischemic attack, unspecified: Secondary | ICD-10-CM | POA: Diagnosis not present

## 2023-10-19 ENCOUNTER — Other Ambulatory Visit: Payer: Medicare PPO

## 2023-10-20 ENCOUNTER — Inpatient Hospital Stay: Payer: Medicare PPO | Admitting: Hematology and Oncology

## 2023-10-20 ENCOUNTER — Inpatient Hospital Stay: Payer: Medicare PPO | Attending: Hematology and Oncology

## 2023-10-20 ENCOUNTER — Telehealth: Payer: Self-pay

## 2023-10-20 VITALS — BP 171/85 | HR 67 | Temp 98.1°F | Resp 14 | Wt 191.4 lb

## 2023-10-20 DIAGNOSIS — Z79899 Other long term (current) drug therapy: Secondary | ICD-10-CM | POA: Diagnosis not present

## 2023-10-20 DIAGNOSIS — I110 Hypertensive heart disease with heart failure: Secondary | ICD-10-CM | POA: Diagnosis not present

## 2023-10-20 DIAGNOSIS — I10 Essential (primary) hypertension: Secondary | ICD-10-CM | POA: Insufficient documentation

## 2023-10-20 DIAGNOSIS — M1A9XX0 Chronic gout, unspecified, without tophus (tophi): Secondary | ICD-10-CM | POA: Diagnosis not present

## 2023-10-20 DIAGNOSIS — C9110 Chronic lymphocytic leukemia of B-cell type not having achieved remission: Secondary | ICD-10-CM | POA: Diagnosis not present

## 2023-10-20 DIAGNOSIS — D696 Thrombocytopenia, unspecified: Secondary | ICD-10-CM | POA: Diagnosis not present

## 2023-10-20 DIAGNOSIS — E1136 Type 2 diabetes mellitus with diabetic cataract: Secondary | ICD-10-CM | POA: Diagnosis not present

## 2023-10-20 DIAGNOSIS — I5020 Unspecified systolic (congestive) heart failure: Secondary | ICD-10-CM | POA: Diagnosis not present

## 2023-10-20 DIAGNOSIS — G459 Transient cerebral ischemic attack, unspecified: Secondary | ICD-10-CM | POA: Diagnosis not present

## 2023-10-20 DIAGNOSIS — C9510 Chronic leukemia of unspecified cell type not having achieved remission: Secondary | ICD-10-CM | POA: Diagnosis not present

## 2023-10-20 DIAGNOSIS — E7800 Pure hypercholesterolemia, unspecified: Secondary | ICD-10-CM | POA: Diagnosis not present

## 2023-10-20 DIAGNOSIS — I440 Atrioventricular block, first degree: Secondary | ICD-10-CM | POA: Diagnosis not present

## 2023-10-20 DIAGNOSIS — R29898 Other symptoms and signs involving the musculoskeletal system: Secondary | ICD-10-CM | POA: Diagnosis not present

## 2023-10-20 LAB — CBC WITH DIFFERENTIAL (CANCER CENTER ONLY)
Abs Immature Granulocytes: 0.16 10*3/uL — ABNORMAL HIGH (ref 0.00–0.07)
Basophils Absolute: 0 10*3/uL (ref 0.0–0.1)
Basophils Relative: 0 %
Eosinophils Absolute: 0.2 10*3/uL (ref 0.0–0.5)
Eosinophils Relative: 0 %
HCT: 37.4 % — ABNORMAL LOW (ref 39.0–52.0)
Hemoglobin: 11 g/dL — ABNORMAL LOW (ref 13.0–17.0)
Immature Granulocytes: 0 %
Lymphocytes Relative: 90 %
Lymphs Abs: 63.9 10*3/uL — ABNORMAL HIGH (ref 0.7–4.0)
MCH: 25.6 pg — ABNORMAL LOW (ref 26.0–34.0)
MCHC: 29.4 g/dL — ABNORMAL LOW (ref 30.0–36.0)
MCV: 87.2 fL (ref 80.0–100.0)
Monocytes Absolute: 3.3 10*3/uL — ABNORMAL HIGH (ref 0.1–1.0)
Monocytes Relative: 5 %
Neutro Abs: 3.6 10*3/uL (ref 1.7–7.7)
Neutrophils Relative %: 5 %
Platelet Count: 102 10*3/uL — ABNORMAL LOW (ref 150–400)
RBC: 4.29 MIL/uL (ref 4.22–5.81)
RDW: 15.1 % (ref 11.5–15.5)
Smear Review: NORMAL
WBC Count: 71.2 10*3/uL (ref 4.0–10.5)
nRBC: 0 % (ref 0.0–0.2)

## 2023-10-20 LAB — CMP (CANCER CENTER ONLY)
ALT: 20 U/L (ref 0–44)
AST: 23 U/L (ref 15–41)
Albumin: 4.3 g/dL (ref 3.5–5.0)
Alkaline Phosphatase: 63 U/L (ref 38–126)
Anion gap: 5 (ref 5–15)
BUN: 21 mg/dL (ref 8–23)
CO2: 30 mmol/L (ref 22–32)
Calcium: 9.1 mg/dL (ref 8.9–10.3)
Chloride: 106 mmol/L (ref 98–111)
Creatinine: 1.14 mg/dL (ref 0.61–1.24)
GFR, Estimated: >60 mL/min (ref >60)
Glucose, Bld: 157 mg/dL — ABNORMAL HIGH (ref 70–99)
Potassium: 4.4 mmol/L (ref 3.5–5.1)
Sodium: 141 mmol/L (ref 135–145)
Total Bilirubin: 0.9 mg/dL (ref <1.2)
Total Protein: 7 g/dL (ref 6.5–8.1)

## 2023-10-20 LAB — LACTATE DEHYDROGENASE: LDH: 176 U/L (ref 98–192)

## 2023-10-20 NOTE — Progress Notes (Signed)
[name] [phone]   [phone]  PROGRESS NOTE  Patient Care Team: Mirna Mires, MD as PCP - General (Family Medicine) Jaci Standard, MD as Consulting Physician (Hematology and Oncology)  Hematological/Oncological History # CLL --diagnosis of chronic lymphoid leukemia made 11/04/2018             (a) the cells are positive for CD 04/12/19/22/23, kappa restricted   (1) Rituximab weekly x 9 weeks beginning 11/23/2018             (a) reaction to first dose (which was 100 mg only).   (2) on maintenance rituximab, first dose 03/07/2019, repeated every 3 months             (a) baseline PET scan obtained on 02/16/2020 was Deauville 2 except for the left iliac nodes, with SUV max 3.1             (b) rituximab decreased to every 6 months beginning October 2021   (3) Switched to Zanubrutinib 160 mg PO BID in July 2024.   Interval History:  C[name] is a 67 year old male with Chronic Lymphocytic Leukemia (CLL) who presents for a follow-up visit while on zanubrutinib. The last visit was on 08/07/2023. In the interim since the last visit he was admitted for a TIA and started on ASA 81mg  and plavix 75mg  x 3 weeks, then continue aspirin alone indefinitely.   Mr. [name] reports he developed a TIA for which he was hospitalized for 2 days.  He reports that he developed slurred speech and difficulty walking.  He notes that while hospitalized everything went back to normal and no overt abnormalities were found during his scan.  He also reports that his symptoms have returned back to normal.  He reports that his blood pressure has been running somewhat high.  He did briefly stop his Zanubrutinib while he was in the [name].  On the Zanubrutinib he has not been having any bleeding, bruising, or dark stools.  He reports that he has not had any recent infectious symptoms such as fevers, chills, sweats.  He reports that he has not had any enlarged lymph nodes  either.  Overall, he is at his baseline level of health.  He denies fevers, chills, sweats, shortness of breath or cough. He has no other complaints. A full 10 point ROS is listed below.   MEDICAL HISTORY:  Past Medical History:  Diagnosis Date   Alcoholic cirrhosis (HCC)    Cataract    Chronic lymphoid leukemia (HCC)    Diabetes mellitus without complication (HCC)    High blood pressure 10/24/2004   History of alcohol abuse    Hypercholesterolemia     SURGICAL HISTORY: No past surgical history on file.  SOCIAL HISTORY: Social History   Socioeconomic History   Marital status: Divorced    Spouse name: Not on file   Number of children: Not on file   Years of education: Not on file   Highest education level: Not on file  Occupational History   Not on file  Tobacco Use   Smoking status: Never   Smokeless tobacco: Never  Substance and Sexual Activity   Alcohol use: Not Currently   Drug use: No   Sexual activity: Not on file  Other Topics Concern   Not on file  Social History Narrative   Not on file   Social Determinants of Health   Financial Resource Strain: Not on file  Food Insecurity: No  Food Insecurity (10/01/2023)   Hunger Vital Sign    Worried About Running Out of Food in the Last Year: Never true    Ran Out of Food in the Last Year: Never true  Transportation Needs: No Transportation Needs (10/01/2023)   PRAPARE - Administrator, Civil Service (Medical): No    Lack of Transportation (Non-Medical): No  Physical Activity: Not on file  Stress: Not on file  Social Connections: Not on file  Intimate Partner Violence: Not At Risk (10/01/2023)   Humiliation, Afraid, Rape, and Kick questionnaire    Fear of Current or Ex-Partner: No    Emotionally Abused: No    Physically Abused: No    Sexually Abused: No    FAMILY HISTORY: Family History  Problem Relation Age of Onset   Alcohol abuse Father    Cirrhosis Father    Coronary artery disease Father      ALLERGIES:  is allergic to rituxan [rituximab].  MEDICATIONS:  Current Outpatient Medications  Medication Sig Dispense Refill   allopurinol (ZYLOPRIM) 300 MG tablet TAKE 1 TABLET DAILY (Patient taking differently: Take 300 mg by mouth in the morning.) 90 tablet 3   aspirin EC 81 MG tablet Take 1 tablet (81 mg total) by mouth daily. Swallow whole. 30 tablet 12   atorvastatin (LIPITOR) 20 MG tablet Take 1 tablet (20 mg total) by mouth at bedtime. 30 tablet 3   B-D UF III MINI PEN NEEDLES 31G X 5 MM MISC      Blood Glucose Monitoring Suppl (ACCU-CHEK GUIDE) w/Device KIT      carvedilol (COREG) 3.125 MG tablet Take 1 tablet (3.125 mg total) by mouth 2 (two) times daily. 60 tablet 3   clopidogrel (PLAVIX) 75 MG tablet Take 1 tablet (75 mg total) by mouth daily for 21 days. Continue for 3 weeks, then stop 21 tablet 0   dapagliflozin propanediol (FARXIGA) 10 MG TABS tablet Take 1 tablet (10 mg total) by mouth daily. 30 tablet 3   fexofenadine (ALLEGRA) 180 MG tablet Take 180 mg by mouth at bedtime.     hydrochlorothiazide (HYDRODIURIL) 25 MG tablet Take 25 mg by mouth daily.     losartan (COZAAR) 50 MG tablet Take 1 tablet (50 mg total) by mouth daily. 30 tablet 3   metFORMIN (GLUCOPHAGE-XR) 500 MG 24 hr tablet Take 1 tablet (500 mg total) by mouth 2 (two) times daily with a meal. Take 500 mg by mouth in the morning and 1,000 mg at bedtime (Patient taking differently: Take 500-1,000 mg by mouth See admin instructions. Take 500 mg by mouth in the morning and 1,000 mg at bedtime)     ONETOUCH DELICA LANCETS FINE MISC 3 (three) times daily.   5   TOUJEO SOLOSTAR 300 UNIT/ML SOPN Inject 0-8 Units into the skin See admin instructions. Inject 0-8 units into the skin in the morning and at bedtime, PER SLIDING SCALE: BGL: 0-99 = give nothing; 100-150 = 4 units; 151-200 = 6 units; 201-250 = 8 units     zanubrutinib (BRUKINSA) 80 MG capsule Take 2 capsules (160 mg total) by mouth 2 (two) times daily. 120  capsule 2   No current facility-administered medications for this visit.    REVIEW OF SYSTEMS:   Constitutional: ( - ) fevers, ( - )  chills , ( - ) night sweats Eyes: ( - ) blurriness of vision, ( - ) double vision, ( - ) watery eyes Ears, nose, mouth, throat, and face: ( - )  mucositis, ( - ) sore throat Respiratory: ( - ) cough, ( - ) dyspnea, ( - ) wheezes Cardiovascular: ( - ) palpitation, ( - ) chest discomfort, ( - ) lower extremity swelling Gastrointestinal:  ( - ) nausea, ( - ) heartburn, ( - ) change in bowel habits Skin: ( - ) abnormal skin rashes Lymphatics: ( - ) new lymphadenopathy, ( - ) easy bruising Neurological: ( - ) numbness, ( - ) tingling, ( - ) new weaknesses Behavioral/Psych: ( - ) mood change, ( - ) new changes  All other systems were reviewed with the patient and are negative.  PHYSICAL EXAMINATION: ECOG PERFORMANCE STATUS: 1 - Symptomatic but completely ambulatory  Vitals:   10/20/23 1151  BP: (!) 171/85  Pulse: 67  Resp: 14  Temp: 98.1 F (36.7 C)  SpO2: 100%     Filed Weights   10/20/23 1151  Weight: 191 lb 6.4 oz (86.8 kg)       GENERAL: Well-appearing elderly male, alert, no distress and comfortable SKIN: skin color, texture, turgor are normal, no rashes or significant lesions EYES: conjunctiva are pink and non-injected, sclera clear LYMPH:  no palpable lymphadenopathy in the cervical or supraclavicular regions.  LUNGS: clear to auscultation and percussion with normal breathing effort HEART: regular rate & rhythm and no murmurs and no lower extremity edema Musculoskeletal: no cyanosis of digits and no clubbing  PSYCH: alert & oriented x 3, fluent speech NEURO: no focal motor/sensory deficits  LABORATORY DATA:  I have reviewed the data as listed    Latest Ref Rng & Units 10/20/2023   11:13 AM 10/01/2023    5:20 AM 09/30/2023    2:29 PM  CBC  WBC 4.0 - 10.5 K/uL 71.2  58.4    Hemoglobin 13.0 - 17.0 g/dL 47.8  29.5  62.1    Hematocrit 39.0 - 52.0 % 37.4  39.7  36.0   Platelets 150 - 400 K/uL 102  114         Latest Ref Rng & Units 10/20/2023   11:13 AM 10/01/2023    5:20 AM 09/30/2023    2:29 PM  CMP  Glucose 70 - 99 mg/dL 308  657  81   BUN 8 - 23 mg/dL 21  28  28    Creatinine 0.61 - 1.24 mg/dL 8.46  9.62  9.52   Sodium 135 - 145 mmol/L 141  137  142   Potassium 3.5 - 5.1 mmol/L 4.4  4.6  4.6   Chloride 98 - 111 mmol/L 106  104  104   CO2 22 - 32 mmol/L 30  25    Calcium 8.9 - 10.3 mg/dL 9.1  8.8    Total Protein 6.5 - 8.1 g/dL 7.0     Total Bilirubin <1.2 mg/dL 0.9     Alkaline Phos 38 - 126 U/L 63     AST 15 - 41 U/L 23     ALT 0 - 44 U/L 20       RADIOGRAPHIC STUDIES: ECHOCARDIOGRAM COMPLETE  Result Date: 10/01/2023    ECHOCARDIOGRAM REPORT   Patient Name:   [name] Date of Exam: 10/01/2023 Medical Rec #:  [id number]             Height:       72.0 in Accession #:    [id number]            Weight:       194.0 lb Date of Birth:  Dec 27, 1955  BSA:          2.103 m Patient Age:    67 years              BP:           186/83 mmHg Patient Gender: M                     HR:           63 bpm. Exam Location:  Inpatient Procedure: 2D Echo, Cardiac Doppler and Color Doppler Indications:    TIA  History:        Patient has no prior history of Echocardiogram examinations.                 TIA; Risk Factors:Hypertension, Diabetes and Dyslipidemia.  Sonographer:    Karma Ganja Referring Phys: [id number] VASUNDHRA RATHORE IMPRESSIONS  1. 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 Left ventricular ejection fraction, by estimation, is 40 to 45%. The left ventricle has mildly decreased function. The left ventricle demonstrates global hypokinesis.  There is moderate left ventricular hypertrophy. Left ventricular diastolic parameters are consistent with Grade I diastolic dysfunction (impaired relaxation).  2. Right ventricular systolic function is normal. The right  ventricular size is normal. Tricuspid regurgitation signal is inadequate for assessing PA pressure.  3. The mitral valve is normal in structure. Trivial mitral valve regurgitation. No evidence of mitral stenosis.  4. The aortic valve is tricuspid. Aortic valve regurgitation is trivial. No aortic stenosis is present.  5. The inferior vena cava is dilated in size with >50% respiratory variability, suggesting right atrial pressure of 8 mmHg. 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 Left ventricular ejection fraction, by estimation, is 40 to 45%. The left ventricle has mildly decreased function. The left ventricle demonstrates global  hypokinesis. The left ventricular internal cavity size was normal in size. There is moderate left ventricular hypertrophy. Left ventricular diastolic parameters are consistent with Grade I diastolic dysfunction (impaired relaxation). Right Ventricle: The right ventricular size is normal. No increase in right ventricular wall thickness. Right ventricular systolic function is normal. Tricuspid regurgitation signal is inadequate for assessing PA pressure. Left Atrium: Left atrial size was normal in size. Right Atrium: Right atrial size was normal in size. Pericardium: There is no evidence of pericardial effusion. Mitral Valve: The mitral valve is normal in structure. Trivial mitral valve regurgitation. No evidence of mitral valve stenosis. Tricuspid Valve: The tricuspid valve is normal in structure. Tricuspid valve regurgitation is not demonstrated. No evidence of tricuspid stenosis. Aortic Valve: The aortic valve is tricuspid. Aortic valve regurgitation is trivial. No aortic stenosis is present. Aortic valve mean gradient measures 3.0 mmHg. Aortic valve peak gradient measures 5.6 mmHg. Aortic valve area, by VTI measures 2.52 cm. Pulmonic Valve: The pulmonic valve was normal in structure. Pulmonic valve regurgitation is  trivial. No evidence of pulmonic stenosis. Aorta: The aortic root is normal in size and structure. Venous: The inferior vena cava is dilated in size with greater than 50% respiratory variability, suggesting right atrial pressure of 8 mmHg. IAS/Shunts: No atrial level shunt detected by color flow Doppler.  LEFT VENTRICLE PLAX 2D LVIDd:         5.30 cm      Diastology LVIDs:         4.20 cm      LV e' medial:    4.13 cm/s LV PW:  1.30 cm      LV E/e' medial:  10.0 LV IVS:        1.70 cm      LV e' lateral:   7.83 cm/s LVOT diam:     2.20 cm      LV E/e' lateral: 5.2 LV SV:         67 LV SV Index:   32 LVOT Area:     3.80 cm  LV Volumes (MOD) LV vol d, MOD A2C: 143.0 ml LV vol d, MOD A4C: 149.0 ml LV vol s, MOD A2C: 82.3 ml LV vol s, MOD A4C: 87.2 ml LV SV MOD A2C:     60.7 ml LV SV MOD A4C:     149.0 ml LV SV MOD BP:      64.1 ml RIGHT VENTRICLE             IVC RV Basal diam:  4.00 cm     IVC diam: 2.10 cm RV S prime:     11.50 cm/s TAPSE (M-mode): 3.0 cm LEFT ATRIUM              Index        RIGHT ATRIUM           Index LA diam:        3.60 cm  1.71 cm/m   RA Area:     25.20 cm LA Vol (A2C):   131.0 ml 62.29 ml/m  RA Volume:   93.50 ml  44.46 ml/m LA Vol (A4C):   54.9 ml  26.10 ml/m LA Biplane Vol: 89.2 ml  42.41 ml/m  AORTIC VALVE AV Area (Vmax):    2.90 cm AV Area (Vmean):   2.38 cm AV Area (VTI):     2.52 cm AV Vmax:           118.00 cm/s AV Vmean:          89.100 cm/s AV VTI:            0.266 m AV Peak Grad:      5.6 mmHg AV Mean Grad:      3.0 mmHg LVOT Vmax:         90.10 cm/s LVOT Vmean:        55.800 cm/s LVOT VTI:          0.176 m LVOT/AV VTI ratio: 0.66  AORTA Ao Root diam: 3.60 cm Ao Asc diam:  3.40 cm MITRAL VALVE MV Area (PHT): 2.60 cm    SHUNTS MV Decel Time: 292 msec    Systemic VTI:  0.18 m MV E velocity: 41.10 cm/s  Systemic Diam: 2.20 cm MV A velocity: 52.30 cm/s MV E/A ratio:  0.79 Weston Brass MD Electronically signed by Weston Brass MD Signature Date/Time:  10/01/2023/4:17:07 PM    Final    MR BRAIN WO CONTRAST  Result Date: 09/30/2023 CLINICAL DATA:  Altered mental status EXAM: MRI HEAD WITHOUT CONTRAST MRA HEAD WITHOUT CONTRAST MRA NECK WITHOUT CONTRAST TECHNIQUE: Multiplanar, multiecho pulse sequences of the brain and surrounding structures were obtained without intravenous contrast. Angiographic images of the Circle of Willis were obtained using MRA technique without intravenous contrast. Angiographic images of the neck were obtained using MRA technique without intravenous contrast. Carotid stenosis measurements (when applicable) are obtained utilizing NASCET criteria, using the distal internal carotid diameter as the denominator. COMPARISON:  None Available. FINDINGS: MRI HEAD FINDINGS Brain: No acute infarct, mass effect or extra-axial collection. No acute or chronic hemorrhage. Mild volume  loss. The midline structures are normal. Vascular: Normal flow voids. Skull and upper cervical spine: Normal calvarium and skull base. Visualized upper cervical spine and soft tissues are normal. Sinuses/Orbits:Left maxillary sinus retention cyst.  Normal orbits. MRA HEAD FINDINGS POSTERIOR CIRCULATION: --Vertebral arteries: Normal --Inferior cerebellar arteries: Normal. --Basilar artery: Normal. --Superior cerebellar arteries: Normal. --Posterior cerebral arteries: Normal. Both posterior communicating arteries are patent. ANTERIOR CIRCULATION: --Intracranial internal carotid arteries: Normal. --Anterior cerebral arteries (ACA): Normal. --Middle cerebral arteries (MCA): Normal. MRA NECK FINDINGS No dissection, occlusion or hemodynamically significant stenosis of the carotid or vertebral arteries. The vertebral system is mildly right dominant. IMPRESSION: 1. No acute intracranial abnormality. 2. Normal intracranial MRA. 3. Normal MRA of the neck. Electronically Signed   By: Deatra Robinson M.D.   On: 09/30/2023 19:35   MR ANGIO HEAD WO CONTRAST  Result Date:  09/30/2023 CLINICAL DATA:  Altered mental status EXAM: MRI HEAD WITHOUT CONTRAST MRA HEAD WITHOUT CONTRAST MRA NECK WITHOUT CONTRAST TECHNIQUE: Multiplanar, multiecho pulse sequences of the brain and surrounding structures were obtained without intravenous contrast. Angiographic images of the Circle of Willis were obtained using MRA technique without intravenous contrast. Angiographic images of the neck were obtained using MRA technique without intravenous contrast. Carotid stenosis measurements (when applicable) are obtained utilizing NASCET criteria, using the distal internal carotid diameter as the denominator. COMPARISON:  None Available. FINDINGS: MRI HEAD FINDINGS Brain: No acute infarct, mass effect or extra-axial collection. No acute or chronic hemorrhage. Mild volume loss. The midline structures are normal. Vascular: Normal flow voids. Skull and upper cervical spine: Normal calvarium and skull base. Visualized upper cervical spine and soft tissues are normal. Sinuses/Orbits:Left maxillary sinus retention cyst.  Normal orbits. MRA HEAD FINDINGS POSTERIOR CIRCULATION: --Vertebral arteries: Normal --Inferior cerebellar arteries: Normal. --Basilar artery: Normal. --Superior cerebellar arteries: Normal. --Posterior cerebral arteries: Normal. Both posterior communicating arteries are patent. ANTERIOR CIRCULATION: --Intracranial internal carotid arteries: Normal. --Anterior cerebral arteries (ACA): Normal. --Middle cerebral arteries (MCA): Normal. MRA NECK FINDINGS No dissection, occlusion or hemodynamically significant stenosis of the carotid or vertebral arteries. The vertebral system is mildly right dominant. IMPRESSION: 1. No acute intracranial abnormality. 2. Normal intracranial MRA. 3. Normal MRA of the neck. Electronically Signed   By: Deatra Robinson M.D.   On: 09/30/2023 19:35   MR ANGIO NECK WO CONTRAST  Result Date: 09/30/2023 CLINICAL DATA:  Altered mental status EXAM: MRI HEAD WITHOUT CONTRAST MRA  HEAD WITHOUT CONTRAST MRA NECK WITHOUT CONTRAST TECHNIQUE: Multiplanar, multiecho pulse sequences of the brain and surrounding structures were obtained without intravenous contrast. Angiographic images of the Circle of Willis were obtained using MRA technique without intravenous contrast. Angiographic images of the neck were obtained using MRA technique without intravenous contrast. Carotid stenosis measurements (when applicable) are obtained utilizing NASCET criteria, using the distal internal carotid diameter as the denominator. COMPARISON:  None Available. FINDINGS: MRI HEAD FINDINGS Brain: No acute infarct, mass effect or extra-axial collection. No acute or chronic hemorrhage. Mild volume loss. The midline structures are normal. Vascular: Normal flow voids. Skull and upper cervical spine: Normal calvarium and skull base. Visualized upper cervical spine and soft tissues are normal. Sinuses/Orbits:Left maxillary sinus retention cyst.  Normal orbits. MRA HEAD FINDINGS POSTERIOR CIRCULATION: --Vertebral arteries: Normal --Inferior cerebellar arteries: Normal. --Basilar artery: Normal. --Superior cerebellar arteries: Normal. --Posterior cerebral arteries: Normal. Both posterior communicating arteries are patent. ANTERIOR CIRCULATION: --Intracranial internal carotid arteries: Normal. --Anterior cerebral arteries (ACA): Normal. --Middle cerebral arteries (MCA): Normal. MRA NECK FINDINGS No dissection, occlusion or hemodynamically significant  stenosis of the carotid or vertebral arteries. The vertebral system is mildly right dominant. IMPRESSION: 1. No acute intracranial abnormality. 2. Normal intracranial MRA. 3. Normal MRA of the neck. Electronically Signed   By: Deatra Robinson M.D.   On: 09/30/2023 19:35   CT HEAD WO CONTRAST  Result Date: 09/30/2023 CLINICAL DATA:  Mental status change, unknown cause, stroke suspected EXAM: CT HEAD WITHOUT CONTRAST TECHNIQUE: Contiguous axial images were obtained from the base of  the skull through the vertex without intravenous contrast. RADIATION DOSE REDUCTION: This exam was performed according to the departmental dose-optimization program which includes automated exposure control, adjustment of the mA and/or kV according to patient size and/or use of iterative reconstruction technique. COMPARISON:  None Available. FINDINGS: Evaluation is somewhat limited by motion artifact. Brain: No evidence of acute infarction, hemorrhage, mass, mass effect, or midline shift. No hydrocephalus or extra-axial collection. Vascular: No hyperdense vessel. Skull: Negative for fracture or focal lesion. Sinuses/Orbits: Mucosal thickening in the ethmoid air cells. Mucous retention cyst in the left frontal sinus. Other: The mastoid air cells are well aerated. ASPECTS [name] Stroke Program Early CT Score) - Ganglionic level infarction (caudate, lentiform nuclei, internal capsule, insula, M1-M3 cortex): 7 - Supraganglionic infarction (M4-M6 cortex): 3 Total score (0-10 with 10 being normal): 10 IMPRESSION: No acute intracranial process. ASPECTS is 10. Code stroke imaging results were communicated on 09/30/2023 at 4:33 pm to provider [facility]WOOD via telephone, who verbally acknowledged these results. Electronically Signed   By: Wiliam Ke M.D.   On: 09/30/2023 16:41    ASSESSMENT & PLAN DMARCO FILTZ is a 67 y.o. male with medical history significant for CLL who presents for a follow up visit.   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 Discontinued due to poor tolerance with medication.  --Labs show white blood cell 71.2, Hgb 11.0, MCV 87.2, Plt 102. Creatinine and LFTs normal.  --Currently on Zanubrutinib 160 mg PO BID.  Recommend to continue without any dose modifications.  --Return to clinic in 1 months for labs and 2 months for a clinic visit.   #Hypertension -Blood  pressure readings have been slightly elevated. Patient reports frequent urination, possibly related to antihypertensive medication (Hydrochlorothiazide). -Advise patient to discuss blood pressure readings with PCP for possible dose adjustment.  #General Health Maintenance -Encourage continued physical activity (treadmill walking).      No orders of the defined types were placed in this encounter.   All questions were answered. The patient knows to call the clinic with any problems, questions or concerns.  I have spent a total of 30 minutes minutes of face-to-face and non-face-to-face time, preparing to see the patient, performing a medically appropriate examination, counseling and educating the patient, documenting clinical information in the electronic health record,and care coordination.   Ulysees Barns, MD Department of Hematology/Oncology [name] Phone: [phone] Pager: [phone] Email: [email or web address]   10/20/2023 2:03 PM  Mathis Fare BD, Catovsky D, Caligaris-Cappio F, Dighiero G, Dhner H, [place], [place], [place] E, Chiorazzi N, [place], Rai KR, [place], Eichhorst B, O'Brien S, Robak T, Seymour JF, Kipps TJ. iwCLL guidelines for diagnosis, indications for treatment, response assessment, and supportive management of CLL. Blood. 2018 Jun 21;131(25):2745-2760.  Active disease should be clearly documented to initiate therapy. At least 1 of the following criteria should be met.  1) Evidence of progressive marrow failure  as manifested by the development of, or worsening of, anemia and/or thrombocytopenia. Cutoff levels of Hb <10 g/dL or platelet counts <469  109/L are generally regarded as indication for treatment. However, in some patients, platelet counts <100  109/L may remain stable over a long period; this situation does not automatically require therapeutic intervention. 2) Massive (ie, >=6 cm  below the left costal margin) or progressive or symptomatic splenomegaly. 3) Massive nodes (ie, >=10 cm in longest diameter) or progressive or symptomatic lymphadenopathy. 4) Progressive lymphocytosis with an increase of >=50% over a 49-month period, or lymphocyte doubling time (LDT) <6 months. LDT can be obtained by linear regression extrapolation of absolute lymphocyte counts obtained at intervals of 2 weeks over an observation period of 2 to 3 months; patients with initial blood lymphocyte counts <30  109/L may require a longer observation period to determine the LDT. Factors contributing to lymphocytosis other than CLL (eg, infections, steroid administration) should be excluded. 5) Autoimmune complications including anemia or thrombocytopenia poorly responsive to corticosteroids. 6) Symptomatic or functional extranodal involvement (eg, skin, kidney, lung, spine). Disease-related symptoms as defined by any of the following: Unintentional weight loss >=10% within the previous 6 months. Significant fatigue (ie, ECOG performance scale 2 or worse; cannot work or unable to perform usual activities). Fevers >=100.66F or 38.0C for 2 or more weeks without evidence of infection. Night sweats for >=1 month without evidence of infection.

## 2023-10-20 NOTE — Telephone Encounter (Signed)
CRITICAL VALUE STICKER  CRITICAL VALUE:  WBC  71.2  RECEIVER (on-site recipient of call): [employer], LPN  DATE & TIME NOTIFIED: 12:10  MESSENGER (representative from lab):  Melissa  MD NOTIFIED: Jeanie Sewer, MD  TIME OF NOTIFICATION:12:11  RESPONSE:

## 2023-10-23 DIAGNOSIS — I5020 Unspecified systolic (congestive) heart failure: Secondary | ICD-10-CM | POA: Diagnosis not present

## 2023-10-23 DIAGNOSIS — C9510 Chronic leukemia of unspecified cell type not having achieved remission: Secondary | ICD-10-CM | POA: Diagnosis not present

## 2023-10-23 DIAGNOSIS — E1136 Type 2 diabetes mellitus with diabetic cataract: Secondary | ICD-10-CM | POA: Diagnosis not present

## 2023-10-23 DIAGNOSIS — E7800 Pure hypercholesterolemia, unspecified: Secondary | ICD-10-CM | POA: Diagnosis not present

## 2023-10-23 DIAGNOSIS — M1A9XX0 Chronic gout, unspecified, without tophus (tophi): Secondary | ICD-10-CM | POA: Diagnosis not present

## 2023-10-23 DIAGNOSIS — G459 Transient cerebral ischemic attack, unspecified: Secondary | ICD-10-CM | POA: Diagnosis not present

## 2023-10-23 DIAGNOSIS — R29898 Other symptoms and signs involving the musculoskeletal system: Secondary | ICD-10-CM | POA: Diagnosis not present

## 2023-10-23 DIAGNOSIS — I110 Hypertensive heart disease with heart failure: Secondary | ICD-10-CM | POA: Diagnosis not present

## 2023-10-23 DIAGNOSIS — I440 Atrioventricular block, first degree: Secondary | ICD-10-CM | POA: Diagnosis not present

## 2023-10-26 DIAGNOSIS — R29898 Other symptoms and signs involving the musculoskeletal system: Secondary | ICD-10-CM | POA: Diagnosis not present

## 2023-10-26 DIAGNOSIS — C9510 Chronic leukemia of unspecified cell type not having achieved remission: Secondary | ICD-10-CM | POA: Diagnosis not present

## 2023-10-26 DIAGNOSIS — I110 Hypertensive heart disease with heart failure: Secondary | ICD-10-CM | POA: Diagnosis not present

## 2023-10-26 DIAGNOSIS — E1136 Type 2 diabetes mellitus with diabetic cataract: Secondary | ICD-10-CM | POA: Diagnosis not present

## 2023-10-26 DIAGNOSIS — M1A9XX0 Chronic gout, unspecified, without tophus (tophi): Secondary | ICD-10-CM | POA: Diagnosis not present

## 2023-10-26 DIAGNOSIS — G459 Transient cerebral ischemic attack, unspecified: Secondary | ICD-10-CM | POA: Diagnosis not present

## 2023-10-26 DIAGNOSIS — I5020 Unspecified systolic (congestive) heart failure: Secondary | ICD-10-CM | POA: Diagnosis not present

## 2023-10-26 DIAGNOSIS — I440 Atrioventricular block, first degree: Secondary | ICD-10-CM | POA: Diagnosis not present

## 2023-10-26 DIAGNOSIS — E7800 Pure hypercholesterolemia, unspecified: Secondary | ICD-10-CM | POA: Diagnosis not present

## 2023-10-28 NOTE — Progress Notes (Unsigned)
[facility] [address] [place]. [place] [address] [phone]       HOSPITAL FOLLOW UP NOTE  [name] Date of Birth:  January 22, 1956 Medical Record Number:  [id number]   Reason for Referral:  hospital stroke follow up    SUBJECTIVE:   CHIEF COMPLAINT:  No chief complaint on file.   HPI:   Cody [name] is a 67 y.o. male who presented to ED on 09/30/2023 with transient slurred speech and right leg numbness.  MRI negative for acute stroke.  MRA head and neck unremarkable.  2D echo showed EF 40 to 45% with global hypokinesis.  LDL 94.  A1c 5.9.  UDS positive THC.  Suspected symptoms in setting of left hemispheric subcortical TIA likely due to small vessel disease.  Recommended DAPT for 3 weeks and aspirin alone and increased home dose atorvastatin from 10 mg to 20 mg daily.  Started on Coreg, hydrochlorothiazide and losartan for HTN.  Newly diagnosed HFrEF and recommended outpatient follow-up.  THC cessation counseling provided.  He was discharged home in stable condition without therapy needs.           ROS:   14 system review of systems performed and negative with exception of ***  PMH:  Past Medical History:  Diagnosis Date   Alcoholic cirrhosis (HCC)    Cataract    Chronic lymphoid leukemia (HCC)    Diabetes mellitus without complication (HCC)    High blood pressure 10/24/2004   History of alcohol abuse    Hypercholesterolemia     PSH: No past surgical history on file.  Social History:  Social History   Socioeconomic History   Marital status: Divorced    Spouse name: Not on file   Number of children: Not on file   Years of education: Not on file   Highest education level: Not on file  Occupational History   Not on file  Tobacco Use   Smoking status: Never   Smokeless tobacco: Never  Substance and Sexual Activity   Alcohol use: Not Currently   Drug use: No   Sexual activity: Not on file  Other Topics  Concern   Not on file  Social History Narrative   Not on file   Social Determinants of Health   Financial Resource Strain: Not on file  Food Insecurity: No Food Insecurity (10/01/2023)   Hunger Vital Sign    Worried About Running Out of Food in the Last Year: Never true    Ran Out of Food in the Last Year: Never true  Transportation Needs: No Transportation Needs (10/01/2023)   PRAPARE - Administrator, Civil Service (Medical): No    Lack of Transportation (Non-Medical): No  Physical Activity: Not on file  Stress: Not on file  Social Connections: Not on file  Intimate Partner Violence: Not At Risk (10/01/2023)   Humiliation, Afraid, Rape, and Kick questionnaire    Fear of Current or Ex-Partner: No    Emotionally Abused: No    Physically Abused: No    Sexually Abused: No    Family History:  Family History  Problem Relation Age of Onset   Alcohol abuse Father    Cirrhosis Father    Coronary artery disease Father     Medications:   Current Outpatient Medications on File Prior to Visit  Medication Sig Dispense Refill   allopurinol (ZYLOPRIM) 300 MG tablet TAKE 1 TABLET DAILY (Patient taking differently: Take 300 mg by mouth in the  morning.) 90 tablet 3   aspirin EC 81 MG tablet Take 1 tablet (81 mg total) by mouth daily. Swallow whole. 30 tablet 12   atorvastatin (LIPITOR) 20 MG tablet Take 1 tablet (20 mg total) by mouth at bedtime. 30 tablet 3   B-D UF III MINI PEN NEEDLES 31G X 5 MM MISC      Blood Glucose Monitoring Suppl (ACCU-CHEK GUIDE) w/Device KIT      carvedilol (COREG) 3.125 MG tablet Take 1 tablet (3.125 mg total) by mouth 2 (two) times daily. 60 tablet 3   dapagliflozin propanediol (FARXIGA) 10 MG TABS tablet Take 1 tablet (10 mg total) by mouth daily. 30 tablet 3   fexofenadine (ALLEGRA) 180 MG tablet Take 180 mg by mouth at bedtime.     hydrochlorothiazide (HYDRODIURIL) 25 MG tablet Take 25 mg by mouth daily.     losartan (COZAAR) 50 MG tablet Take 1  tablet (50 mg total) by mouth daily. 30 tablet 3   metFORMIN (GLUCOPHAGE-XR) 500 MG 24 hr tablet Take 1 tablet (500 mg total) by mouth 2 (two) times daily with a meal. Take 500 mg by mouth in the morning and 1,000 mg at bedtime (Patient taking differently: Take 500-1,000 mg by mouth See admin instructions. Take 500 mg by mouth in the morning and 1,000 mg at bedtime)     ONETOUCH DELICA LANCETS FINE MISC 3 (three) times daily.   5   TOUJEO SOLOSTAR 300 UNIT/ML SOPN Inject 0-8 Units into the skin See admin instructions. Inject 0-8 units into the skin in the morning and at bedtime, PER SLIDING SCALE: BGL: 0-99 = give nothing; 100-150 = 4 units; 151-200 = 6 units; 201-250 = 8 units     zanubrutinib (BRUKINSA) 80 MG capsule Take 2 capsules (160 mg total) by mouth 2 (two) times daily. 120 capsule 2   No current facility-administered medications on file prior to visit.    Allergies:   Allergies  Allergen Reactions   Rituxan [Rituximab] Other (See Comments)    "Shaking all over and diaphoretic"      OBJECTIVE:  Physical Exam  There were no vitals filed for this visit. There is no height or weight on file to calculate BMI. No results found.      No data to display           General: well developed, well nourished, seated, in no evident distress Head: head normocephalic and atraumatic.   Neck: supple with no carotid or supraclavicular bruits Cardiovascular: regular rate and rhythm, no murmurs Musculoskeletal: no deformity Skin:  no rash/petichiae Vascular:  Normal pulses all extremities   Neurologic Exam Mental Status: Awake and fully alert. Oriented to place and time. Recent and remote memory intact. Attention span, concentration and fund of knowledge appropriate. Mood and affect appropriate.  Cranial Nerves: Fundoscopic exam reveals sharp disc margins. Pupils equal, briskly reactive to light. Extraocular movements full without nystagmus. Visual fields full to confrontation. Hearing  intact. Facial sensation intact. Face, tongue, palate moves normally and symmetrically.  Motor: Normal bulk and tone. Normal strength in all tested extremity muscles Sensory.: intact to touch , pinprick , position and vibratory sensation.  Coordination: Rapid alternating movements normal in all extremities. Finger-to-nose and heel-to-shin performed accurately bilaterally. Gait and Station: Arises from chair without difficulty. Stance is normal. Gait demonstrates normal stride length and balance with ***. Tandem walk and heel toe ***.  Reflexes: 1+ and symmetric. Toes downgoing.     NIHSS  *** Modified Rankin  ***  ASSESSMENT: Cody [name] is a 67 y.o. year old male with likely left subcortical TIA on 09/30/2023 secondary to small vessel disease after presenting with dysarthria and RLE weakness. Vascular risk factors include HTN, HLD, DM, and new dx of HFrEF.      PLAN:  TIA:  Continue aspirin 81mg  daily and atorvastatin (Lipitor) 20 mg daily for secondary stroke prevention.   Discussed secondary stroke prevention measures and importance of close PCP follow up for aggressive stroke risk factor management including BP goal<130/90, HLD with LDL goal<70 and DM with A1c.<7 .  Stroke labs 09/2023: LDL 94, A1c 6.2 I have gone over the pathophysiology of stroke, warning signs and symptoms, risk factors and their management in some detail with instructions to go to the closest emergency room for symptoms of concern.     Follow up in *** or call earlier if needed   CC:  [facility] provider: Dr. Pearlean Brownie PCP: Mirna Mires, MD    I spent *** minutes of face-to-face and non-face-to-face time with patient.  This included previsit chart review including review of recent hospitalization, lab review, study review, order entry, electronic health record documentation, patient education regarding recent stroke including etiology, secondary stroke prevention measures and importance of managing  stroke risk factors, residual deficits and typical recovery time and answered all other questions to patient satisfaction   Ihor Austin, AGNP-BC  [facility] [address] [place], [place] [address]  Phone [phone] Fax [phone] Note: This document was prepared with digital dictation and possible smart phrase technology. Any transcriptional errors that result from this process are unintentional.

## 2023-10-29 ENCOUNTER — Ambulatory Visit: Payer: Medicare PPO | Admitting: Adult Health

## 2023-10-29 ENCOUNTER — Encounter: Payer: Self-pay | Admitting: Adult Health

## 2023-10-29 ENCOUNTER — Other Ambulatory Visit (HOSPITAL_COMMUNITY): Payer: Self-pay | Admitting: Pharmacy Technician

## 2023-10-29 ENCOUNTER — Other Ambulatory Visit (HOSPITAL_COMMUNITY): Payer: Self-pay

## 2023-10-29 VITALS — BP 168/79 | HR 68 | Ht 73.0 in | Wt 188.8 lb

## 2023-10-29 DIAGNOSIS — G459 Transient cerebral ischemic attack, unspecified: Secondary | ICD-10-CM

## 2023-10-29 DIAGNOSIS — E7800 Pure hypercholesterolemia, unspecified: Secondary | ICD-10-CM | POA: Diagnosis not present

## 2023-10-29 DIAGNOSIS — E1136 Type 2 diabetes mellitus with diabetic cataract: Secondary | ICD-10-CM | POA: Diagnosis not present

## 2023-10-29 DIAGNOSIS — R29898 Other symptoms and signs involving the musculoskeletal system: Secondary | ICD-10-CM | POA: Diagnosis not present

## 2023-10-29 DIAGNOSIS — M1A9XX0 Chronic gout, unspecified, without tophus (tophi): Secondary | ICD-10-CM | POA: Diagnosis not present

## 2023-10-29 DIAGNOSIS — I110 Hypertensive heart disease with heart failure: Secondary | ICD-10-CM | POA: Diagnosis not present

## 2023-10-29 DIAGNOSIS — C9510 Chronic leukemia of unspecified cell type not having achieved remission: Secondary | ICD-10-CM | POA: Diagnosis not present

## 2023-10-29 DIAGNOSIS — I440 Atrioventricular block, first degree: Secondary | ICD-10-CM | POA: Diagnosis not present

## 2023-10-29 DIAGNOSIS — I5020 Unspecified systolic (congestive) heart failure: Secondary | ICD-10-CM | POA: Diagnosis not present

## 2023-10-29 NOTE — Progress Notes (Signed)
Specialty Pharmacy Refill Coordination Note  Cody [name] is a 67 y.o. male contacted today regarding refills of specialty medication(s) Zanubrutinib  Spoke with sister  Patient requested Delivery   Delivery date: 11/06/23   Verified address: [address] [name], [name]   Medication will be filled on 11/05/23.

## 2023-10-29 NOTE — Patient Instructions (Addendum)
Continue aspirin 81 mg daily  and atorvastatin 20 mg daily for secondary stroke prevention  Follow-up with cardiology in January as scheduled  Continue to monitor blood pressure at tome, would recommend checking blood pressure 1-2 hours after taking medications as well  Continue to follow up with PCP regarding blood pressure, diabetes and cholesterol management  Maintain strict control of hypertension with blood pressure goal below 130/90 and cholesterol with LDL cholesterol (bad cholesterol) goal below 70 mg/dL.   Signs of a Stroke? Follow the BEFAST method:  Balance Watch for a sudden loss of balance, trouble with coordination or vertigo Eyes Is there a sudden loss of vision in one or both eyes? Or double vision?  Face: Ask the person to smile. Does one side of the face droop or is it numb?  Arms: Ask the person to raise both arms. Does one arm drift downward? Is there weakness or numbness of a leg? Speech: Ask the person to repeat a simple phrase. Does the speech sound slurred/strange? Is the person confused ? Time: If you observe any of these signs, call 911.        Thank you for coming to see [place] at [facility]. I hope we have been able to provide you high quality care today.  You may receive a patient satisfaction survey over the next few weeks. We would appreciate your feedback and comments so that we may continue to improve ourselves and the health of our patients.    Transient Ischemic Attack A transient ischemic attack (TIA) causes the same symptoms as a stroke, but the symptoms go away quickly. A TIA happens when blood flow to the brain is blocked. Having a TIA means you may be at risk for a stroke. A TIA is a medical emergency. What are the causes? A TIA is caused by a blocked artery in the head or neck. This means the brain does not get the blood supply it needs. A blockage can be caused by: Fatty buildup in an artery in the head or neck. A blood  clot. A tear in an artery. Irritation and swelling (inflammation) of an artery. Sometimes the cause is not known. What increases the risk? Certain things may make you more likely to have a TIA. Some of these are things that you can change, such as: Using products that have nicotine or tobacco. Not being active. Drinking too much alcohol. Using recreational drugs. Health conditions that may increase your risk include: High blood pressure. High cholesterol. Diabetes. Heart disease. A heartbeat that is not regular (atrial fibrillation). Sickle cell disease. Problems with blood clotting. Other risk factors include: Being over the age of 26. Being male. Being very overweight. Sleep problems (sleep apnea). Having a family history of stroke. Having had blood clots, stroke, TIA, or heart attack in the past. What are the signs or symptoms? The symptoms of a TIA are like those of a stroke. They can include: Weakness or loss of feeling in your face, arm, or leg. This often happens on one side of your body. Trouble walking. Trouble moving your arms or legs. Trouble talking or understanding what people are saying. Problems with how you see. Feeling dizzy. Feeling confused. Loss of balance or coordination. Feeling like you may vomit (nausea) or vomiting. Having a very bad headache. If you can, note what time you started to have symptoms. Tell your doctor. How is this treated? The goal of treatment is to lower the risk for a stroke. This may  include: Changes to diet and lifestyle, such as getting regular exercise and stopping smoking. Taking medicines to: Thin the blood. Lower blood pressure. Lower cholesterol. Treating other health conditions, such as diabetes. If testing shows that an artery in your brain is narrow, your doctor may recommend a procedure to: Take the blockage out of your artery. Open or widen an artery in your neck (carotid angioplasty and stenting). Follow these  instructions at home: Medicines Take over-the-counter and prescription medicines only as told by your doctor. If you were told to take aspirin or another medicine to thin your blood, use it exactly as told by your doctor. Taking too much of the medicine can cause bleeding. Taking too little of the medicine may not work to treat the problem. Eating and drinking  Eat 5 or more servings of fruits and vegetables each day. Follow instructions from your doctor about your diet. You may need to follow a certain diet to help lower your risk of a stroke. You may need to: Eat a diet that is low in fat and salt. Eat foods with a lot of fiber. Limit carbohydrates and sugar. If you drink alcohol: Limit how much you have to: 0-1 drink a day for women who are not pregnant. 0-2 drinks a day for men. Know how much alcohol is in a drink. In the [place] one drink equals one 12 oz bottle of beer (355 mL), one 5 oz glass of wine (148 mL), or one 1 oz glass of hard liquor (44 mL). General instructions Keep a healthy weight. Try to get at least 30 minutes of exercise on most days. Get treatment if you have sleep problems. Do not smoke or use any products that contain nicotine or tobacco. If you need help quitting, ask your doctor. Do not use drugs. Keep all follow-up visits. Your doctor will want to know if you have any more symptoms and to check blood labs if any medicines were prescribed. Where to find more information American Stroke Association: stroke.org Get help right away if: You have chest pain. You have a heartbeat that is not regular. You have any signs of a stroke. "BE FAST" is an easy way to remember the main warning signs: B - Balance. Dizziness, sudden trouble walking, or loss of balance. E - Eyes. Trouble seeing or a change in how you see. F - Face. Sudden weakness or loss of feeling of the face. The face or eyelid may droop on one side. A - Arms. Weakness or loss of feeling in an arm. This  happens all of a sudden and most often on one side of the body. S - Speech. Sudden trouble speaking, slurred speech, or trouble understanding what people say. T - Time. Time to call emergency services. Write down what time symptoms started. You have other signs of a stroke, such as: A sudden, very bad headache with no known cause. Feeling like you may vomit. Vomiting. A seizure. These symptoms may be an emergency. Get help right away. Call 911. Do not wait to see if the symptoms will go away. Do not drive yourself to the hospital. This information is not intended to replace advice given to you by your health care provider. Make sure you discuss any questions you have with your health care provider. Document Revised: 04/25/2022 Document Reviewed: 04/25/2022 Elsevier Patient Education  2024 [employer].

## 2023-11-04 DIAGNOSIS — E7800 Pure hypercholesterolemia, unspecified: Secondary | ICD-10-CM | POA: Diagnosis not present

## 2023-11-04 DIAGNOSIS — I5020 Unspecified systolic (congestive) heart failure: Secondary | ICD-10-CM | POA: Diagnosis not present

## 2023-11-04 DIAGNOSIS — I110 Hypertensive heart disease with heart failure: Secondary | ICD-10-CM | POA: Diagnosis not present

## 2023-11-04 DIAGNOSIS — R29898 Other symptoms and signs involving the musculoskeletal system: Secondary | ICD-10-CM | POA: Diagnosis not present

## 2023-11-04 DIAGNOSIS — G459 Transient cerebral ischemic attack, unspecified: Secondary | ICD-10-CM | POA: Diagnosis not present

## 2023-11-04 DIAGNOSIS — M1A9XX0 Chronic gout, unspecified, without tophus (tophi): Secondary | ICD-10-CM | POA: Diagnosis not present

## 2023-11-04 DIAGNOSIS — C9510 Chronic leukemia of unspecified cell type not having achieved remission: Secondary | ICD-10-CM | POA: Diagnosis not present

## 2023-11-04 DIAGNOSIS — E1136 Type 2 diabetes mellitus with diabetic cataract: Secondary | ICD-10-CM | POA: Diagnosis not present

## 2023-11-04 DIAGNOSIS — I440 Atrioventricular block, first degree: Secondary | ICD-10-CM | POA: Diagnosis not present

## 2023-11-07 NOTE — Progress Notes (Signed)
I agree with the above plan 

## 2023-11-10 ENCOUNTER — Other Ambulatory Visit: Payer: Self-pay | Admitting: Nurse Practitioner

## 2023-11-10 DIAGNOSIS — Z794 Long term (current) use of insulin: Secondary | ICD-10-CM | POA: Diagnosis not present

## 2023-11-10 DIAGNOSIS — C9510 Chronic leukemia of unspecified cell type not having achieved remission: Secondary | ICD-10-CM | POA: Diagnosis not present

## 2023-11-10 DIAGNOSIS — I110 Hypertensive heart disease with heart failure: Secondary | ICD-10-CM | POA: Diagnosis not present

## 2023-11-10 DIAGNOSIS — R29898 Other symptoms and signs involving the musculoskeletal system: Secondary | ICD-10-CM | POA: Diagnosis not present

## 2023-11-10 DIAGNOSIS — E1169 Type 2 diabetes mellitus with other specified complication: Secondary | ICD-10-CM | POA: Diagnosis not present

## 2023-11-10 DIAGNOSIS — I440 Atrioventricular block, first degree: Secondary | ICD-10-CM | POA: Diagnosis not present

## 2023-11-10 DIAGNOSIS — I10 Essential (primary) hypertension: Secondary | ICD-10-CM | POA: Diagnosis not present

## 2023-11-10 DIAGNOSIS — E1136 Type 2 diabetes mellitus with diabetic cataract: Secondary | ICD-10-CM | POA: Diagnosis not present

## 2023-11-10 DIAGNOSIS — G459 Transient cerebral ischemic attack, unspecified: Secondary | ICD-10-CM | POA: Diagnosis not present

## 2023-11-10 DIAGNOSIS — C9110 Chronic lymphocytic leukemia of B-cell type not having achieved remission: Secondary | ICD-10-CM

## 2023-11-10 DIAGNOSIS — E7800 Pure hypercholesterolemia, unspecified: Secondary | ICD-10-CM | POA: Diagnosis not present

## 2023-11-10 DIAGNOSIS — M1A9XX0 Chronic gout, unspecified, without tophus (tophi): Secondary | ICD-10-CM | POA: Diagnosis not present

## 2023-11-10 DIAGNOSIS — I5020 Unspecified systolic (congestive) heart failure: Secondary | ICD-10-CM | POA: Diagnosis not present

## 2023-11-11 ENCOUNTER — Telehealth: Payer: Self-pay | Admitting: *Deleted

## 2023-11-11 ENCOUNTER — Inpatient Hospital Stay: Payer: Medicare PPO | Attending: Hematology and Oncology

## 2023-11-11 DIAGNOSIS — C9110 Chronic lymphocytic leukemia of B-cell type not having achieved remission: Secondary | ICD-10-CM | POA: Insufficient documentation

## 2023-11-11 LAB — CMP (CANCER CENTER ONLY)
ALT: 20 U/L (ref 0–44)
AST: 23 U/L (ref 15–41)
Albumin: 4.5 g/dL (ref 3.5–5.0)
Alkaline Phosphatase: 71 U/L (ref 38–126)
Anion gap: 5 (ref 5–15)
BUN: 23 mg/dL (ref 8–23)
CO2: 31 mmol/L (ref 22–32)
Calcium: 9.5 mg/dL (ref 8.9–10.3)
Chloride: 106 mmol/L (ref 98–111)
Creatinine: 1.14 mg/dL (ref 0.61–1.24)
GFR, Estimated: >60 mL/min (ref >60)
Glucose, Bld: 115 mg/dL — ABNORMAL HIGH (ref 70–99)
Potassium: 4.5 mmol/L (ref 3.5–5.1)
Sodium: 142 mmol/L (ref 135–145)
Total Bilirubin: 0.9 mg/dL (ref <1.2)
Total Protein: 6.9 g/dL (ref 6.5–8.1)

## 2023-11-11 LAB — CBC WITH DIFFERENTIAL (CANCER CENTER ONLY)
Abs Immature Granulocytes: 0.28 10*3/uL — ABNORMAL HIGH (ref 0.00–0.07)
Basophils Absolute: 0.1 10*3/uL (ref 0.0–0.1)
Basophils Relative: 0 %
Eosinophils Absolute: 0.2 10*3/uL (ref 0.0–0.5)
Eosinophils Relative: 0 %
HCT: 37.6 % — ABNORMAL LOW (ref 39.0–52.0)
Hemoglobin: 11.1 g/dL — ABNORMAL LOW (ref 13.0–17.0)
Immature Granulocytes: 0 %
Lymphocytes Relative: 93 %
Lymphs Abs: 72 10*3/uL — ABNORMAL HIGH (ref 0.7–4.0)
MCH: 25.9 pg — ABNORMAL LOW (ref 26.0–34.0)
MCHC: 29.5 g/dL — ABNORMAL LOW (ref 30.0–36.0)
MCV: 87.6 fL (ref 80.0–100.0)
Monocytes Absolute: 2.1 10*3/uL — ABNORMAL HIGH (ref 0.1–1.0)
Monocytes Relative: 3 %
Neutro Abs: 3.5 10*3/uL (ref 1.7–7.7)
Neutrophils Relative %: 4 %
Platelet Count: 111 10*3/uL — ABNORMAL LOW (ref 150–400)
RBC: 4.29 MIL/uL (ref 4.22–5.81)
RDW: 15.6 % — ABNORMAL HIGH (ref 11.5–15.5)
WBC Count: 78.1 10*3/uL (ref 4.0–10.5)
nRBC: 0.1 % (ref 0.0–0.2)

## 2023-11-11 LAB — LACTATE DEHYDROGENASE: LDH: 184 U/L (ref 98–192)

## 2023-11-11 NOTE — Telephone Encounter (Signed)
CRITICAL VALUE STICKER  CRITICAL VALUE:  WBC 78.1  RECEIVER (on-site recipient of call): Binnie Rail, RN  DATE & TIME NOTIFIED: 11/11/23  10:50 am  MESSENGER (representative from lab): Pam  MD NOTIFIED: Dr. Leonides Schanz  TIME OF NOTIFICATION: WBC 78.1  RESPONSE: MD reviewed

## 2023-11-12 ENCOUNTER — Encounter: Payer: Self-pay | Admitting: Podiatry

## 2023-11-12 ENCOUNTER — Ambulatory Visit (INDEPENDENT_AMBULATORY_CARE_PROVIDER_SITE_OTHER): Payer: Medicare PPO | Admitting: Podiatry

## 2023-11-12 DIAGNOSIS — B351 Tinea unguium: Secondary | ICD-10-CM | POA: Diagnosis not present

## 2023-11-12 DIAGNOSIS — M79674 Pain in right toe(s): Secondary | ICD-10-CM | POA: Diagnosis not present

## 2023-11-12 DIAGNOSIS — E1151 Type 2 diabetes mellitus with diabetic peripheral angiopathy without gangrene: Secondary | ICD-10-CM

## 2023-11-12 DIAGNOSIS — M79675 Pain in left toe(s): Secondary | ICD-10-CM | POA: Diagnosis not present

## 2023-11-12 NOTE — Progress Notes (Signed)
This patient presents to the office with chief complaint of long thick painful nails.  Patient says the nails are painful walking and wearing shoes.  This patient is unable to self treat.  This patient is unable to trim his nails since he is unable to reach his  nails.   he presents to the office for preventative foot care services.    General Appearance  Alert, conversant and in no acute stress.  Vascular  Dorsalis pedis and posterior tibial  pulses are palpable  bilaterally.  Capillary return is within normal limits  bilaterally. Temperature is within normal limits  bilaterally.  Neurologic  Senn-Weinstein monofilament wire test within normal limits  bilaterally. Muscle power within normal limits bilaterally.  Nails Thick disfigured discolored nails with subungual debris  from hallux to fifth toes bilaterally. No evidence of bacterial infection or drainage bilaterally.   Orthopedic  No limitations of motion  feet .  No crepitus or effusions noted.  No bony pathology .  Syndactaly 2-3 left foot.  Skin  normotropic skin with  noted bilaterally.  No signs of infections or ulcers noted.   Porokeratosis sub 2 right foot.  Onychomycosis  Nails  B/L.  Pain in right toes  Pain in left toes   Debridement of nails both feet followed trimming the nails with dremel tool.  Debridement of callus with # 15 blade and dremel tool.    RTC  3  months.   Helane Gunther DPM

## 2023-11-16 DIAGNOSIS — I5020 Unspecified systolic (congestive) heart failure: Secondary | ICD-10-CM | POA: Diagnosis not present

## 2023-11-16 DIAGNOSIS — R29898 Other symptoms and signs involving the musculoskeletal system: Secondary | ICD-10-CM | POA: Diagnosis not present

## 2023-11-16 DIAGNOSIS — G459 Transient cerebral ischemic attack, unspecified: Secondary | ICD-10-CM | POA: Diagnosis not present

## 2023-11-16 DIAGNOSIS — I440 Atrioventricular block, first degree: Secondary | ICD-10-CM | POA: Diagnosis not present

## 2023-11-16 DIAGNOSIS — E1136 Type 2 diabetes mellitus with diabetic cataract: Secondary | ICD-10-CM | POA: Diagnosis not present

## 2023-11-16 DIAGNOSIS — M1A9XX0 Chronic gout, unspecified, without tophus (tophi): Secondary | ICD-10-CM | POA: Diagnosis not present

## 2023-11-16 DIAGNOSIS — C9510 Chronic leukemia of unspecified cell type not having achieved remission: Secondary | ICD-10-CM | POA: Diagnosis not present

## 2023-11-16 DIAGNOSIS — I110 Hypertensive heart disease with heart failure: Secondary | ICD-10-CM | POA: Diagnosis not present

## 2023-11-16 DIAGNOSIS — E7800 Pure hypercholesterolemia, unspecified: Secondary | ICD-10-CM | POA: Diagnosis not present

## 2023-11-23 ENCOUNTER — Encounter (HOSPITAL_COMMUNITY): Payer: Self-pay

## 2023-11-24 ENCOUNTER — Telehealth (HOSPITAL_COMMUNITY): Payer: Self-pay | Admitting: *Deleted

## 2023-11-24 NOTE — Telephone Encounter (Signed)
 Reaching out to patient to offer assistance regarding upcoming cardiac imaging study; sister verbalizes understanding of appt date/time, parking situation and where to check in, pre-test NPO status and medications ordered, and verified current allergies; name and call back number provided for further questions should they arise Sid Seats RN Navigator Cardiac Imaging [facility] [phone] office [phone] cell  Denies claustrophobia or metal.

## 2023-11-26 ENCOUNTER — Other Ambulatory Visit: Payer: Self-pay | Admitting: Cardiology

## 2023-11-26 ENCOUNTER — Ambulatory Visit (HOSPITAL_COMMUNITY)
Admission: RE | Admit: 2023-11-26 | Discharge: 2023-11-26 | Disposition: A | Discharge status: Home / Self Care | Payer: Medicare PPO | Source: Ambulatory Visit | Attending: Cardiology | Admitting: Cardiology

## 2023-11-26 DIAGNOSIS — I428 Other cardiomyopathies: Secondary | ICD-10-CM

## 2023-11-26 MED ORDER — GADOBUTROL 1 MMOL/ML IV SOLN
10.0000 mL | Freq: Once | INTRAVENOUS | Status: AC | PRN
  Administered 2023-11-26: 10 mL via INTRAVENOUS

## 2023-12-01 ENCOUNTER — Other Ambulatory Visit: Payer: Self-pay

## 2023-12-01 ENCOUNTER — Other Ambulatory Visit: Payer: Self-pay | Admitting: Hematology and Oncology

## 2023-12-01 DIAGNOSIS — N1831 Chronic kidney disease, stage 3a: Secondary | ICD-10-CM | POA: Diagnosis not present

## 2023-12-01 DIAGNOSIS — C9190 Lymphoid leukemia, unspecified not having achieved remission: Secondary | ICD-10-CM | POA: Diagnosis not present

## 2023-12-01 DIAGNOSIS — I129 Hypertensive chronic kidney disease with stage 1 through stage 4 chronic kidney disease, or unspecified chronic kidney disease: Secondary | ICD-10-CM | POA: Diagnosis not present

## 2023-12-01 NOTE — Progress Notes (Signed)
 Specialty Pharmacy Refill Coordination Note  ARDELL MAKAREWICZ is a 68 y.o. male, patients sister was contacted today regarding refills of specialty medication(s) Zanubrutinib  (Brukinsa )   Patient requested Delivery   Delivery date: 12/07/23   Verified address: [address] [place] [address]   Medication will be filled on 12/04/23 pending a refill request.

## 2023-12-02 ENCOUNTER — Other Ambulatory Visit: Payer: Self-pay

## 2023-12-02 MED ORDER — BRUKINSA 80 MG PO CAPS
160.0000 mg | ORAL_CAPSULE | Freq: Two times a day (BID) | ORAL | 2 refills | Status: DC
  Filled 2023-12-02: qty 120, 30d supply, fill #0

## 2023-12-03 ENCOUNTER — Other Ambulatory Visit: Payer: Self-pay

## 2023-12-03 NOTE — Progress Notes (Signed)
  Patient was contacted today and was left a voice mail regarding specialty medication, due to upcoming weather conditions medications will be filled 12/03/23 and will be delivered 12/04/23

## 2023-12-03 NOTE — Progress Notes (Signed)
 Cardiology Office Note:  .   Date:  12/03/2023  ID:  Cody [name], DOB 05/06/1956, MRN [id number] PCP: Leigh Lung, MD  [facility] Providers Cardiologist:  None    History of Present Illness: .   Cody [name] is a 68 y.o. male with history of alcohol cirrhosis, diabetes, CLL, hypertension, alcohol abuse, who is being referred to cardiology for recent TIA and reduced EF.  He presented to the emergency room in November 2024 with slurred speech and leg numbness.  His MRI was negative for an acute stroke.  His echo showed an EF of 40 to 45% with global hypokinesis.  It also showed prominent trabeculations.  his UDS was positive for THC.  He was suspected to have had a TIA.  He was managed with DAPT.  He underwent a cardiac MRI which showed an EF of 55%.  He did have prominent apical trabeculations however this did not fit criteria for LV non compaction.  He also had LGE in a noncoronary distribution which was not necessarily diagnostic for noncompaction either. Today, he feels well and is here with his family member.  He has no cardiac symptoms. No family hx of cardiac dx.   ROS:  per HPI otherwise negative   Studies Reviewed: SABRA        TTE 10/01/2023 LVEF mildly reduced, RV function is normal. No significant valve disease.  Cardiac MRI 11/26/2023 1. Mildly dilated LV with mild concentric LV hypertrophy. Normal systolic function, EF 55%. There are prominent apical trabeculations that do appear to fit MRI criteria for LV noncompaction.   2. Normal RV size with borderline decreased systolic function, EF 49%.   3. Non-coronary LGE pattern, with prominent mid-wall LGE in the mid inferolateral wall and more subtle mid-wall LGE in the septum. LGE can be seen with myocardial noncompaction but is not common with this diagnosis.   4.  Normal extracellular volume percentage.   Risk Assessment/Calculations:        Physical Exam:   VS:   Vitals:   12/04/23 1338  BP:  130/78  Pulse: 77  SpO2: 97%    Wt Readings from Last 3 Encounters:  10/29/23 188 lb 12.8 oz (85.6 kg)  10/20/23 191 lb 6.4 oz (86.8 kg)  09/30/23 194 lb (88 kg)    GEN: Well nourished, well developed in no acute distress NECK: No JVD; No carotid bruits CARDIAC: RRR, no murmurs, rubs, gallops RESPIRATORY:  Clear to auscultation without rales, wheezing or rhonchi  ABDOMEN: Soft, non-tender, non-distended EXTREMITIES:  No edema; No deformity   ASSESSMENT AND PLAN: .   HFmREF , EF normal via MRI He had reduced EF in the setting of a stroke.  May be stress related.  His cardiac MRI showed a normal EF of 55%.  He was noted to have mid wall LGE that was not in coronary artery distribution; unclear if this could have been myocarditis.  He had significant trabeculations on his surface echo however his MRI did not meet criteria for noncompaction.  This was not pathopneumonic for non-compaction.  He also did not have MRI criteria for LV non-compaction.  Will just monitor clinically for now.  TIA -Will defer antiplatelet and statin therapy to his neurologist Suspect this is related to cerebrovascular disease.  Do not recommend a cardiac monitor  HTN Well controlled.  Continue coreg  3.125 mg BID Continue amlodipine  2.5 mg daily   CLL  On zanubrutinib  .  This is a BTK inhibitor associated with  atrial fibrillation and potentially ventricular arrhythmias, hypertension, as well as heart failure.  He has been evaluated for this potential cardiotoxicity per above, and I will plan for a cardiac monitor.   Dispo: Can follow-up with an APP in 3 months  Signed, Neta Upadhyay, Ronal BRAVO, MD

## 2023-12-04 ENCOUNTER — Encounter: Payer: Self-pay | Admitting: Internal Medicine

## 2023-12-04 ENCOUNTER — Ambulatory Visit: Payer: Medicare PPO | Attending: Internal Medicine | Admitting: Internal Medicine

## 2023-12-04 VITALS — BP 130/78 | HR 77 | Ht 72.0 in | Wt 193.0 lb

## 2023-12-04 DIAGNOSIS — I10 Essential (primary) hypertension: Secondary | ICD-10-CM

## 2023-12-04 DIAGNOSIS — I428 Other cardiomyopathies: Secondary | ICD-10-CM | POA: Diagnosis not present

## 2023-12-04 NOTE — Patient Instructions (Signed)
 Medication Instructions:  No changes  *If you need a refill on your cardiac medications before your next appointment, please call your pharmacy*    Follow-Up: At [facility], you and your health needs are our priority.  As part of our continuing mission to provide you with exceptional heart care, we have created designated Provider Care Teams.  These Care Teams include your primary Cardiologist (physician) and Advanced Practice Providers (APPs -  Physician Assistants and Nurse Practitioners) who all work together to provide you with the care you need, when you need it.  Your next appointment:   6 month(s) with an APP    Other Instructions

## 2023-12-08 ENCOUNTER — Inpatient Hospital Stay: Payer: Medicare PPO | Attending: Hematology and Oncology

## 2023-12-08 ENCOUNTER — Inpatient Hospital Stay: Payer: Medicare PPO | Attending: Hematology and Oncology | Admitting: Hematology and Oncology

## 2023-12-08 ENCOUNTER — Telehealth: Payer: Self-pay

## 2023-12-08 ENCOUNTER — Other Ambulatory Visit (HOSPITAL_COMMUNITY): Payer: Self-pay

## 2023-12-08 ENCOUNTER — Telehealth: Payer: Self-pay | Admitting: Pharmacy Technician

## 2023-12-08 VITALS — BP 170/87 | HR 72 | Temp 98.5°F | Resp 14 | Wt 189.8 lb

## 2023-12-08 DIAGNOSIS — Z79899 Other long term (current) drug therapy: Secondary | ICD-10-CM | POA: Insufficient documentation

## 2023-12-08 DIAGNOSIS — Z5112 Encounter for antineoplastic immunotherapy: Secondary | ICD-10-CM | POA: Insufficient documentation

## 2023-12-08 DIAGNOSIS — C9110 Chronic lymphocytic leukemia of B-cell type not having achieved remission: Secondary | ICD-10-CM | POA: Diagnosis not present

## 2023-12-08 DIAGNOSIS — D696 Thrombocytopenia, unspecified: Secondary | ICD-10-CM | POA: Diagnosis not present

## 2023-12-08 DIAGNOSIS — I10 Essential (primary) hypertension: Secondary | ICD-10-CM | POA: Diagnosis not present

## 2023-12-08 LAB — CBC WITH DIFFERENTIAL (CANCER CENTER ONLY)
Abs Immature Granulocytes: 0.19 10*3/uL — ABNORMAL HIGH (ref 0.00–0.07)
Basophils Absolute: 0.1 10*3/uL (ref 0.0–0.1)
Basophils Relative: 0 %
Eosinophils Absolute: 0.2 10*3/uL (ref 0.0–0.5)
Eosinophils Relative: 0 %
HCT: 37.5 % — ABNORMAL LOW (ref 39.0–52.0)
Hemoglobin: 11.4 g/dL — ABNORMAL LOW (ref 13.0–17.0)
Immature Granulocytes: 0 %
Lymphocytes Relative: 94 %
Lymphs Abs: 110.2 10*3/uL — ABNORMAL HIGH (ref 0.7–4.0)
MCH: 25.8 pg — ABNORMAL LOW (ref 26.0–34.0)
MCHC: 30.4 g/dL (ref 30.0–36.0)
MCV: 84.8 fL (ref 80.0–100.0)
Monocytes Absolute: 4.3 10*3/uL — ABNORMAL HIGH (ref 0.1–1.0)
Monocytes Relative: 4 %
Neutro Abs: 2.5 10*3/uL (ref 1.7–7.7)
Neutrophils Relative %: 2 %
Platelet Count: 112 10*3/uL — ABNORMAL LOW (ref 150–400)
RBC: 4.42 MIL/uL (ref 4.22–5.81)
RDW: 16.1 % — ABNORMAL HIGH (ref 11.5–15.5)
Smear Review: NORMAL
WBC Count: 117.5 10*3/uL (ref 4.0–10.5)
nRBC: 0 % (ref 0.0–0.2)

## 2023-12-08 LAB — CMP (CANCER CENTER ONLY)
ALT: 19 U/L (ref 0–44)
AST: 22 U/L (ref 15–41)
Albumin: 4.6 g/dL (ref 3.5–5.0)
Alkaline Phosphatase: 72 U/L (ref 38–126)
Anion gap: 5 (ref 5–15)
BUN: 27 mg/dL — ABNORMAL HIGH (ref 8–23)
CO2: 29 mmol/L (ref 22–32)
Calcium: 9.5 mg/dL (ref 8.9–10.3)
Chloride: 105 mmol/L (ref 98–111)
Creatinine: 1.13 mg/dL (ref 0.61–1.24)
GFR, Estimated: >60 mL/min (ref >60)
Glucose, Bld: 97 mg/dL (ref 70–99)
Potassium: 4.6 mmol/L (ref 3.5–5.1)
Sodium: 139 mmol/L (ref 135–145)
Total Bilirubin: 0.8 mg/dL (ref 0.0–1.2)
Total Protein: 7.5 g/dL (ref 6.5–8.1)

## 2023-12-08 LAB — LACTATE DEHYDROGENASE: LDH: 216 U/L — ABNORMAL HIGH (ref 98–192)

## 2023-12-08 MED ORDER — VENETOCLAX 10 & 50 & 100 MG PO TBPK: ORAL_TABLET | ORAL | 0 refills | Status: DC

## 2023-12-08 NOTE — Telephone Encounter (Signed)
 Oral Oncology Patient Advocate Encounter   Received notification that prior authorization for Venclexta  is required.   PA submitted on 12/08/23 Key [id number] Status is pending     [name], CPhT-Adv Oncology Pharmacy Patient Advocate [facility] Direct Number: [phone]  Fax: [phone]

## 2023-12-08 NOTE — Progress Notes (Signed)
 [facility] [phone]   [phone]  PROGRESS NOTE  Patient Care Team: Leigh Lung, MD as PCP - General (Family Medicine) Federico Norleen ONEIDA MADISON, MD as Consulting Physician (Hematology and Oncology)  Hematological/Oncological History # CLL --diagnosis of chronic lymphoid leukemia made 11/04/2018             (a) the cells are positive for CD 04/12/19/22/23, kappa restricted   (1) Rituximab  weekly x 9 weeks beginning 11/23/2018             (a) reaction to first dose (which was 100 mg only).   (2) on maintenance rituximab , first dose 03/07/2019, repeated every 3 months             (a) baseline PET scan obtained on 02/16/2020 was Deauville 2 except for the left iliac nodes, with SUV max 3.1             (b) rituximab  decreased to every 6 months beginning October 2021   (3) Switched to Zanubrutinib  160 mg PO BID in July 2024.   Interval History:  Mr. Deskins is a 68 year old male with Chronic Lymphocytic Leukemia (CLL) who presents for a follow-up visit while on zanubrutinib . The last visit was on 10/20/2023. In the interim since the last visit he is continued on his Zanubrutinib  therapy.  Mr. Heggs reports he has been faithfully taking his Zanubrutinib  therapy with no missed dosages.  He reports that he continues on dual antiplatelet therapy as a result of his stroke before our last visit in November.  He reports that he is having some issues with urination at nighttime but overall is not in any pain.  He denies any lightheadedness, dizziness, shortness of breath.  He reports his energy level and appetite are stable.  Overall, he is at his baseline level of health.  He denies fevers, chills, sweats, shortness of breath or cough. He has no other complaints. A full 10 point ROS is listed below.  The bulk of our discussion focused on the results of his most recent blood work.  It appears his lymphocytosis is increasing despite being on the Zanubrutinib   therapy which she started over 6 months ago.  Given these findings we discussed alternative therapies including obinutuzumab  plus or minus venetoclax  or pirtobrutinib.  The patient notes he be agreeable to starting up on obinutuzumab  therapy.  MEDICAL HISTORY:  Past Medical History:  Diagnosis Date   Alcoholic cirrhosis (HCC)    Cataract    Chronic lymphoid leukemia (HCC)    Diabetes mellitus without complication (HCC)    High blood pressure 10/24/2004   History of alcohol abuse    Hypercholesterolemia     SURGICAL HISTORY: No past surgical history on file.  SOCIAL HISTORY: Social History   Socioeconomic History   Marital status: Divorced    Spouse name: Not on file   Number of children: Not on file   Years of education: Not on file   Highest education level: Not on file  Occupational History   Not on file  Tobacco Use   Smoking status: Never   Smokeless tobacco: Never  Substance and Sexual Activity   Alcohol use: Not Currently   Drug use: No   Sexual activity: Not on file  Other Topics Concern   Not on file  Social History Narrative   Not on file   Social Drivers of Health   Financial Resource Strain: Not on file  Food Insecurity: No Food Insecurity (10/01/2023)  Hunger Vital Sign    Worried About Running Out of Food in the Last Year: Never true    Ran Out of Food in the Last Year: Never true  Transportation Needs: No Transportation Needs (10/01/2023)   PRAPARE - Administrator, Civil Service (Medical): No    Lack of Transportation (Non-Medical): No  Physical Activity: Not on file  Stress: Not on file  Social Connections: Not on file  Intimate Partner Violence: Not At Risk (10/01/2023)   Humiliation, Afraid, Rape, and Kick questionnaire    Fear of Current or Ex-Partner: No    Emotionally Abused: No    Physically Abused: No    Sexually Abused: No    FAMILY HISTORY: Family History  Problem Relation Age of Onset   Alcohol abuse Father     Cirrhosis Father    Coronary artery disease Father     ALLERGIES:  is allergic to rituxan  [rituximab ].  MEDICATIONS:  Current Outpatient Medications  Medication Sig Dispense Refill   venetoclax  (VENCLEXTA ) 10 & 50 & 100 MG Starter Pack Take by mouth daily. Take 20 mg for 7 days, then 50 mg daily x 7d, then 100 mg daily x 7d, then 200 mg daily x 7d. Take with food & water. 42 tablet 0   ACCU-CHEK GUIDE TEST test strip USE TO CHECK BLOOD SUGAR UP TO FOUR TIMES PER DAY.     allopurinol  (ZYLOPRIM ) 300 MG tablet TAKE 1 TABLET DAILY (Patient taking differently: Take 300 mg by mouth in the morning.) 90 tablet 3   amLODipine  (NORVASC ) 2.5 MG tablet Take 2.5 mg by mouth at bedtime.     aspirin  EC 81 MG tablet Take 1 tablet (81 mg total) by mouth daily. Swallow whole. 30 tablet 12   atorvastatin  (LIPITOR) 20 MG tablet Take 1 tablet (20 mg total) by mouth at bedtime. 30 tablet 3   B-D UF III MINI PEN NEEDLES 31G X 5 MM MISC      Blood Glucose Monitoring Suppl (ACCU-CHEK GUIDE) w/Device KIT      carvedilol  (COREG ) 3.125 MG tablet Take 1 tablet (3.125 mg total) by mouth 2 (two) times daily. 60 tablet 3   dapagliflozin  propanediol (FARXIGA ) 10 MG TABS tablet Take 1 tablet (10 mg total) by mouth daily. 30 tablet 3   fexofenadine (ALLEGRA) 180 MG tablet Take 180 mg by mouth at bedtime.     hydrochlorothiazide (HYDRODIURIL) 25 MG tablet Take 25 mg by mouth daily.     losartan  (COZAAR ) 50 MG tablet Take 50 mg by mouth 2 (two) times daily.     metFORMIN  (GLUCOPHAGE -XR) 500 MG 24 hr tablet Take 1 tablet (500 mg total) by mouth 2 (two) times daily with a meal. Take 500 mg by mouth in the morning and 1,000 mg at bedtime (Patient taking differently: Take 500-1,000 mg by mouth See admin instructions. Take 500 mg by mouth in the morning and 1,000 mg at bedtime)     ONETOUCH DELICA LANCETS FINE MISC 3 (three) times daily.   5   TOUJEO  SOLOSTAR 300 UNIT/ML SOPN Inject 0-8 Units into the skin See admin instructions.  Inject 0-8 units into the skin in the morning and at bedtime, PER SLIDING SCALE: BGL: 0-99 = give nothing; 100-150 = 4 units; 151-200 = 6 units; 201-250 = 8 units     zanubrutinib  (BRUKINSA ) 80 MG capsule Take 2 capsules (160 mg total) by mouth 2 (two) times daily. 120 capsule 2   No current facility-administered medications for  this visit.    REVIEW OF SYSTEMS:   Constitutional: ( - ) fevers, ( - )  chills , ( - ) night sweats Eyes: ( - ) blurriness of vision, ( - ) double vision, ( - ) watery eyes Ears, nose, mouth, throat, and face: ( - ) mucositis, ( - ) sore throat Respiratory: ( - ) cough, ( - ) dyspnea, ( - ) wheezes Cardiovascular: ( - ) palpitation, ( - ) chest discomfort, ( - ) lower extremity swelling Gastrointestinal:  ( - ) nausea, ( - ) heartburn, ( - ) change in bowel habits Skin: ( - ) abnormal skin rashes Lymphatics: ( - ) new lymphadenopathy, ( - ) easy bruising Neurological: ( - ) numbness, ( - ) tingling, ( - ) new weaknesses Behavioral/Psych: ( - ) mood change, ( - ) new changes  All other systems were reviewed with the patient and are negative.  PHYSICAL EXAMINATION: ECOG PERFORMANCE STATUS: 1 - Symptomatic but completely ambulatory  Vitals:   12/08/23 1111  BP: (!) 170/87  Pulse: 72  Resp: 14  Temp: 98.5 F (36.9 C)  SpO2: 100%      Filed Weights   12/08/23 1111  Weight: 189 lb 12.8 oz (86.1 kg)        GENERAL: Well-appearing elderly male, alert, no distress and comfortable SKIN: skin color, texture, turgor are normal, no rashes or significant lesions EYES: conjunctiva are pink and non-injected, sclera clear LYMPH:  no palpable lymphadenopathy in the cervical or supraclavicular regions.  LUNGS: clear to auscultation and percussion with normal breathing effort HEART: regular rate & rhythm and no murmurs and no lower extremity edema Musculoskeletal: no cyanosis of digits and no clubbing  PSYCH: alert & oriented x 3, fluent speech NEURO: no focal  motor/sensory deficits  LABORATORY DATA:  I have reviewed the data as listed    Latest Ref Rng & Units 12/08/2023   10:03 AM 11/11/2023   10:24 AM 10/20/2023   11:13 AM  CBC  WBC 4.0 - 10.5 K/uL 117.5  78.1  71.2   Hemoglobin 13.0 - 17.0 g/dL 88.5  88.8  88.9   Hematocrit 39.0 - 52.0 % 37.5  37.6  37.4   Platelets 150 - 400 K/uL 112  111  102        Latest Ref Rng & Units 12/08/2023   10:03 AM 11/11/2023   10:24 AM 10/20/2023   11:13 AM  CMP  Glucose 70 - 99 mg/dL 97  884  842   BUN 8 - 23 mg/dL 27  23  21    Creatinine 0.61 - 1.24 mg/dL 8.86  8.85  8.85   Sodium 135 - 145 mmol/L 139  142  141   Potassium 3.5 - 5.1 mmol/L 4.6  4.5  4.4   Chloride 98 - 111 mmol/L 105  106  106   CO2 22 - 32 mmol/L 29  31  30    Calcium  8.9 - 10.3 mg/dL 9.5  9.5  9.1   Total Protein 6.5 - 8.1 g/dL 7.5  6.9  7.0   Total Bilirubin 0.0 - 1.2 mg/dL 0.8  0.9  0.9   Alkaline Phos 38 - 126 U/L 72  71  63   AST 15 - 41 U/L 22  23  23    ALT 0 - 44 U/L 19  20  20      RADIOGRAPHIC STUDIES: MR CARDIAC VELOCITY FLOW MAP Result Date: 11/27/2023 CLINICAL DATA:  Cardiomyopathy of uncertain etiology  EXAM: CARDIAC MRI TECHNIQUE: The patient was scanned on a 1.5 Tesla GE magnet. A dedicated cardiac coil was used. Functional imaging was done using Fiesta sequences. 2,3, and 4 chamber views were done to assess for RWMA's. Modified Simpson's rule using a short axis stack was used to calculate an ejection fraction on a dedicated work [employer]. The patient received 10 cc of Gadavist . After 10 minutes inversion recovery sequences were used to assess for infiltration and scar tissue. FINDINGS: Limited images of the lung fields showed no gross abnormalities. Mildly dilated left ventricle with mild concentric LV hypertrophy. 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 Normal wall motion, LV EF 55%. Normal right ventricular size with borderline hypokinesis, RV EF 49%.  Mild biatrial enlargement. Trileaflet aortic valve with no significant stenosis or regurgitation. No significant mitral regurgitation noted. On delayed enhancement imaging, there was basal to mid inferolateral mid-wall late gadolinium enhancement. Subtle mid-wall stripe of LGE in the basal septum. MEASUREMENTS: MEASUREMENTS LVEDV 267 mL LVEDVi 127 mL/m2 LVSV 146 mL LVEF 55% RVEDV 210 mL RVEDVi 100 mL/m2 RVSV 103 mL RVEF 49% T1 1056, ECV 29% Aortic forward volume 142 mL Aortic regurgitant fraction 1% IMPRESSION: 1. Mildly dilated LV with mild concentric LV hypertrophy. Normal systolic function, EF 55%. There are prominent apical trabeculations that do appear to fit MRI criteria for LV noncompaction. 2. Normal RV size with borderline decreased systolic function, EF 49%. 3. Non-coronary LGE pattern, with prominent mid-wall LGE in the mid inferolateral wall and more subtle mid-wall LGE in the septum. LGE can be seen with myocardial noncompaction but is not common with this diagnosis. 4.  Normal extracellular volume percentage. Dalton Mclean Electronically Signed   By: Ezra Shuck M.D.   On: 11/27/2023 23:44   MR CARDIAC VELOCITY FLOW MAP Result Date: 11/27/2023 CLINICAL DATA:  Cardiomyopathy of uncertain etiology EXAM: CARDIAC MRI TECHNIQUE: The patient was scanned on a 1.5 Tesla GE magnet. A dedicated cardiac coil was used. Functional imaging was done using Fiesta sequences. 2,3, and 4 chamber views were done to assess for RWMA's. Modified Simpson's rule using a short axis stack was used to calculate an ejection fraction on a dedicated work [employer]. The patient received 10 cc of Gadavist . After 10 minutes inversion recovery sequences were used to assess for infiltration and scar tissue. FINDINGS: Limited images of the lung fields showed no gross abnormalities. Mildly dilated left ventricle with mild concentric LV hypertrophy. Prominent apical trabeculations appear to fulfill MRI criterion for  LV noncompaction. Normal wall motion, LV EF 55%. Normal right ventricular size with borderline hypokinesis, RV EF 49%. Mild biatrial enlargement. Trileaflet aortic valve with no significant stenosis or regurgitation. No significant mitral regurgitation noted. On delayed enhancement imaging, there was basal to mid inferolateral mid-wall late gadolinium enhancement. Subtle mid-wall stripe of LGE in the basal septum. MEASUREMENTS: MEASUREMENTS LVEDV 267 mL LVEDVi 127 mL/m2 LVSV 146 mL LVEF 55% RVEDV 210 mL RVEDVi 100 mL/m2 RVSV 103 mL RVEF 49% T1 1056, ECV 29% Aortic forward volume 142 mL Aortic regurgitant fraction 1% IMPRESSION: 1. Mildly dilated LV with mild concentric LV hypertrophy. Normal systolic function, EF 55%. There are prominent apical trabeculations that do appear to fit MRI criteria for LV noncompaction. 2. Normal RV size with borderline decreased systolic function, EF 49%. 3. Non-coronary LGE pattern, with prominent mid-wall LGE in the mid inferolateral wall and more subtle mid-wall LGE in the septum. LGE can be seen with myocardial noncompaction  but is not common with this diagnosis. 4.  Normal extracellular volume percentage. Dalton Mclean Electronically Signed   By: Ezra Shuck M.D.   On: 11/27/2023 23:43   MR CARDIAC MORPHOLOGY W WO CONTRAST Result Date: 11/27/2023 CLINICAL DATA:  Cardiomyopathy of uncertain etiology EXAM: CARDIAC MRI TECHNIQUE: The patient was scanned on a 1.5 Tesla GE magnet. A dedicated cardiac coil was used. Functional imaging was done using Fiesta sequences. 2,3, and 4 chamber views were done to assess for RWMA's. Modified Simpson's rule using a short axis stack was used to calculate an ejection fraction on a dedicated work [employer]. The patient received 10 cc of Gadavist . After 10 minutes inversion recovery sequences were used to assess for infiltration and scar tissue. FINDINGS: Limited images of the lung fields showed no gross abnormalities. Mildly  dilated left ventricle with mild concentric LV hypertrophy. 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 Normal wall motion, LV EF 55%. Normal right ventricular size with borderline hypokinesis, RV EF 49%. Mild biatrial enlargement. Trileaflet aortic valve with no significant stenosis or regurgitation. No significant mitral regurgitation noted. On delayed enhancement imaging, there was basal to mid inferolateral mid-wall late gadolinium enhancement. Subtle mid-wall stripe of LGE in the basal septum. MEASUREMENTS: MEASUREMENTS LVEDV 267 mL LVEDVi 127 mL/m2 LVSV 146 mL LVEF 55% RVEDV 210 mL RVEDVi 100 mL/m2 RVSV 103 mL RVEF 49% T1 1056, ECV 29% Aortic forward volume 142 mL Aortic regurgitant fraction 1% IMPRESSION: 1. Mildly dilated LV with mild concentric LV hypertrophy. Normal systolic function, EF 55%. There are prominent apical trabeculations that do appear to fit MRI criteria for LV noncompaction. 2. Normal RV size with borderline decreased systolic function, EF 49%. 3. Non-coronary LGE pattern, with prominent mid-wall LGE in the mid inferolateral wall and more subtle mid-wall LGE in the septum. LGE can be seen with myocardial noncompaction but is not common with this diagnosis. 4.  Normal extracellular volume percentage. Dalton Mclean Electronically Signed   By: Ezra Shuck M.D.   On: 11/27/2023 23:43    ASSESSMENT & PLAN ALEXZANDER DOLINGER is a 68 y.o. male with medical history significant for CLL who presents for a follow up visit.   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 Discontinued due to poor tolerance with medication.  --Labs show white blood cell 117.5, Hgb 11.4, MCV 84.8, Plt 112. Creatinine and LFTs normal.  --Currently on Zanubrutinib  160 mg PO BID. will stop this medication as he is having progression of  disease. --Plan to proceed with obinutuzumab  plus venetoclax .  If patient is doing particular well on obinutuzumab  could consider that therapy alone, but will prescribe venetoclax  and begin the process of obtaining this medication. --Return to clinic on Day 8 of treatment.   #Hypertension -Blood pressure readings have been slightly elevated. Patient reports frequent urination, possibly related to antihypertensive medication (Hydrochlorothiazide). -Advise patient to discuss blood pressure readings with PCP for possible dose adjustment.  #General Health Maintenance -Encourage continued physical activity (treadmill walking).      Orders Placed This Encounter  Procedures   CBC with Differential (Cancer Center Only)    Standing Status:   Future    Expected Date:   12/24/2023    Expiration Date:   12/23/2024   CMP (Cancer Center only)    Standing Status:   Future    Expected Date:   12/24/2023  Expiration Date:   12/23/2024   Uric acid    Standing Status:   Future    Expected Date:   12/24/2023    Expiration Date:   12/23/2024   Lactate dehydrogenase (LDH)    Standing Status:   Future    Expected Date:   12/24/2023    Expiration Date:   12/23/2024   CBC with Differential (Cancer Center Only)    Standing Status:   Future    Expected Date:   12/31/2023    Expiration Date:   12/30/2024   CMP (Cancer Center only)    Standing Status:   Future    Expected Date:   12/31/2023    Expiration Date:   12/30/2024   Uric acid    Standing Status:   Future    Expected Date:   12/31/2023    Expiration Date:   12/30/2024   Lactate dehydrogenase (LDH)    Standing Status:   Future    Expected Date:   12/31/2023    Expiration Date:   12/30/2024   CBC with Differential (Cancer Center Only)    Standing Status:   Future    Expected Date:   01/07/2024    Expiration Date:   01/06/2025   CMP (Cancer Center only)    Standing Status:   Future    Expected Date:   01/07/2024    Expiration Date:   01/06/2025   Uric acid     Standing Status:   Future    Expected Date:   01/07/2024    Expiration Date:   01/06/2025   Lactate dehydrogenase (LDH)    Standing Status:   Future    Expected Date:   01/07/2024    Expiration Date:   01/06/2025    All questions were answered. The patient knows to call the clinic with any problems, questions or concerns.  I have spent a total of 30 minutes minutes of face-to-face and non-face-to-face time, preparing to see the patient, performing a medically appropriate examination, counseling and educating the patient, documenting clinical information in the electronic health record,and care coordination.   Norleen IVAR Kidney, MD Department of Hematology/Oncology [facility] Phone: [phone] Pager: [phone] Email: [email or web address]   12/08/2023 2:20 PM  Shermon CHRISTELLA Hearing BD, Catovsky D, Caligaris-Cappio F, Dighiero G, Dhner H, Hillmen P, Keating M, Montserrat E, Chiorazzi N, Stilgenbauer S, Rai KR, [place], Eichhorst B, O'Brien S, Robak T, Seymour JF, Kipps TJ. iwCLL guidelines for diagnosis, indications for treatment, response assessment, and supportive management of CLL. Blood. 2018 Jun 21;131(25):2745-2760.  Active disease should be clearly documented to initiate therapy. At least 1 of the following criteria should be met.  1) Evidence of progressive marrow failure as manifested by the development of, or worsening of, anemia and/or thrombocytopenia. Cutoff levels of Hb <10 g/dL or platelet counts <899  109/L are generally regarded as indication for treatment. However, in some patients, platelet counts <100  109/L may remain stable over a long period; this situation does not automatically require therapeutic intervention. 2) Massive (ie, >=6 cm below the left costal margin) or progressive or symptomatic splenomegaly. 3) Massive nodes (ie, >=10 cm in longest diameter) or progressive or symptomatic lymphadenopathy. 4) Progressive  lymphocytosis with an increase of >=50% over a 47-month period, or lymphocyte doubling time (LDT) <6 months. LDT can be obtained by linear regression extrapolation of absolute lymphocyte counts obtained at intervals of 2 weeks over an observation period of 2 to  3 months; patients with initial blood lymphocyte counts <30  109/L may require a longer observation period to determine the LDT. Factors contributing to lymphocytosis other than CLL (eg, infections, steroid administration) should be excluded. 5) Autoimmune complications including anemia or thrombocytopenia poorly responsive to corticosteroids. 6) Symptomatic or functional extranodal involvement (eg, skin, kidney, lung, spine). Disease-related symptoms as defined by any of the following: Unintentional weight loss >=10% within the previous 6 months. Significant fatigue (ie, ECOG performance scale 2 or worse; cannot work or unable to perform usual activities). Fevers >=100.30F or 38.0C for 2 or more weeks without evidence of infection. Night sweats for >=1 month without evidence of infection.

## 2023-12-08 NOTE — Progress Notes (Signed)
START OFF PATHWAY REGIMEN - Lymphoma and CLL   [id number]:Venetoclax PO ramp-up (C1-2) followed by Venetoclax PO daily D1-28 (C3-12) + Obinutuzumab IV (C1-6) q28 Days:   Cycle 1: A cycle is 28 days:     Venetoclax      Obinutuzumab      Obinutuzumab      Obinutuzumab    Cycle 2: A cycle is 28 days:     Venetoclax      Venetoclax      Venetoclax      Venetoclax      Obinutuzumab    Cycles 3 through 6: A cycle is 28 days:     Venetoclax      Obinutuzumab    Cycles 7 through 12: A cycle is 28 days:     Venetoclax   **Always confirm dose/schedule in your pharmacy ordering system**  Patient Characteristics: Chronic Lymphocytic Leukemia (CLL), Treatment Indicated, Second Line Disease Type: Chronic Lymphocytic Leukemia (CLL) Disease Type: Not Applicable Disease Type: Not Applicable Treatment Indicated<= Treatment Indicated Line of Therapy: Second Line Intent of Therapy: Non-Curative / Palliative Intent, Discussed with Patient

## 2023-12-08 NOTE — Telephone Encounter (Signed)
 Oral Oncology Pharmacist Encounter  Received new prescription for Venclexta  (venetoclax ) for the treatment of CLL in conjunction with obinutuzumab , planned duration until disease progression or unacceptable toxicity.  Labs from 12/08/23 (CBC, CMP) assessed, no interventions needed. Prescription dose and frequency assessed for appropriateness.   Current medication list in Epic reviewed, DDIs with Venclexta  identified: - carvedilol  (cat D):  P-glycoprotein/ABCB1 inhibitors may increase the concentration of venetoclax . It is recommended to reduce venetoclax  dose by 50% in pts requiring concomitant treatment. Will discuss with MD.  - farxiga , metformin , toujeo  (cat C): venetoclax  may decrease the therapeutic effect of antidiabetic agents. Will monitor blood sugars while initiating therapy.   Evaluated chart and no patient barriers to medication adherence noted.   Patient agreement for treatment documented in MD note on 12/08/2023.  Prescription has been e-scribed to the [facility] for benefits analysis and approval.  Oral Oncology Clinic will continue to follow for insurance authorization, copayment issues, initial counseling and start date.  Navea Woodrow, PharmD Hematology/Oncology Clinical Pharmacist [facility] Oral Chemotherapy Navigation [facility] [phone] 12/08/2023 3:07 PM

## 2023-12-08 NOTE — Telephone Encounter (Signed)
 Oral Oncology Patient Advocate Encounter  Prior Authorization for Venclexta  has been approved.    PA# [id number] Effective dates: 12/08/23 through 11/23/24  Patients co-pay is $0.    [name], CPhT-Adv Oncology Pharmacy Patient Advocate [facility] Direct Number: [phone]  Fax: [phone]

## 2023-12-09 ENCOUNTER — Telehealth: Payer: Self-pay | Admitting: Hematology and Oncology

## 2023-12-09 ENCOUNTER — Other Ambulatory Visit (HOSPITAL_COMMUNITY): Payer: Self-pay

## 2023-12-09 ENCOUNTER — Other Ambulatory Visit: Payer: Self-pay

## 2023-12-09 NOTE — Progress Notes (Signed)
 Family member of patient called letting the pharmacy know that Brukinsa  has been discontinued By MD due to not working & will be changing therapy. Patient family member was asking if they can return medication I let family member know once leave the pharmacy it can not be returned. Advise to reach out to cancer center or can bring to any [facility] outpatient pharmacy & place in [place].

## 2023-12-10 ENCOUNTER — Encounter: Payer: Self-pay | Admitting: Hematology and Oncology

## 2023-12-14 ENCOUNTER — Encounter: Payer: Self-pay | Admitting: Hematology and Oncology

## 2023-12-17 NOTE — Progress Notes (Signed)
Pharmacist Chemotherapy Monitoring - Initial Assessment    Anticipated start date: 12/24/23   The following has been reviewed per standard work regarding the patient's treatment regimen: The patient's diagnosis, treatment plan and drug doses, and organ/hematologic function Lab orders and baseline tests specific to treatment regimen  The treatment plan start date, drug sequencing, and pre-medications Prior authorization status  Patient's documented medication list, including drug-drug interaction screen and prescriptions for anti-emetics and supportive care specific to the treatment regimen The drug concentrations, fluid compatibility, administration routes, and timing of the medications to be used The patient's access for treatment and lifetime cumulative dose history, if applicable  The patient's medication allergies and previous infusion related reactions, if applicable   Changes made to treatment plan:  N/A  Follow up needed:  Hep b testing done in 2020   Janeice Robinson, RPH, 12/17/2023  11:44 AM

## 2023-12-22 DIAGNOSIS — I5022 Chronic systolic (congestive) heart failure: Secondary | ICD-10-CM | POA: Diagnosis not present

## 2023-12-22 DIAGNOSIS — I130 Hypertensive heart and chronic kidney disease with heart failure and stage 1 through stage 4 chronic kidney disease, or unspecified chronic kidney disease: Secondary | ICD-10-CM | POA: Diagnosis not present

## 2023-12-22 DIAGNOSIS — C9110 Chronic lymphocytic leukemia of B-cell type not having achieved remission: Secondary | ICD-10-CM | POA: Diagnosis not present

## 2023-12-22 DIAGNOSIS — N1831 Chronic kidney disease, stage 3a: Secondary | ICD-10-CM | POA: Diagnosis not present

## 2023-12-22 DIAGNOSIS — E1122 Type 2 diabetes mellitus with diabetic chronic kidney disease: Secondary | ICD-10-CM | POA: Diagnosis not present

## 2023-12-23 MED FILL — Dexamethasone Sodium Phosphate Inj 100 MG/10ML: INTRAMUSCULAR | Qty: 2 | Status: AC

## 2023-12-24 ENCOUNTER — Ambulatory Visit: Payer: Medicare PPO | Admitting: Physician Assistant

## 2023-12-24 ENCOUNTER — Other Ambulatory Visit: Payer: Self-pay

## 2023-12-24 ENCOUNTER — Other Ambulatory Visit: Payer: Self-pay | Admitting: Hematology and Oncology

## 2023-12-24 ENCOUNTER — Inpatient Hospital Stay: Payer: Medicare PPO

## 2023-12-24 VITALS — BP 143/77 | HR 81 | Temp 99.2°F | Resp 18 | Wt 186.8 lb

## 2023-12-24 VITALS — BP 163/82 | HR 81 | Resp 18

## 2023-12-24 DIAGNOSIS — C9110 Chronic lymphocytic leukemia of B-cell type not having achieved remission: Secondary | ICD-10-CM

## 2023-12-24 DIAGNOSIS — Z5112 Encounter for antineoplastic immunotherapy: Secondary | ICD-10-CM | POA: Diagnosis not present

## 2023-12-24 DIAGNOSIS — T50905A Adverse effect of unspecified drugs, medicaments and biological substances, initial encounter: Secondary | ICD-10-CM | POA: Diagnosis not present

## 2023-12-24 DIAGNOSIS — Z79899 Other long term (current) drug therapy: Secondary | ICD-10-CM | POA: Diagnosis not present

## 2023-12-24 DIAGNOSIS — I10 Essential (primary) hypertension: Secondary | ICD-10-CM | POA: Diagnosis not present

## 2023-12-24 LAB — CBC WITH DIFFERENTIAL (CANCER CENTER ONLY)
Abs Immature Granulocytes: 0.28 10*3/uL — ABNORMAL HIGH (ref 0.00–0.07)
Basophils Absolute: 0.1 10*3/uL (ref 0.0–0.1)
Basophils Relative: 0 %
Eosinophils Absolute: 0.2 10*3/uL (ref 0.0–0.5)
Eosinophils Relative: 0 %
HCT: 36.6 % — ABNORMAL LOW (ref 39.0–52.0)
Hemoglobin: 10.7 g/dL — ABNORMAL LOW (ref 13.0–17.0)
Immature Granulocytes: 0 %
Lymphocytes Relative: 93 %
Lymphs Abs: 108.8 10*3/uL — ABNORMAL HIGH (ref 0.7–4.0)
MCH: 25.5 pg — ABNORMAL LOW (ref 26.0–34.0)
MCHC: 29.2 g/dL — ABNORMAL LOW (ref 30.0–36.0)
MCV: 87.1 fL (ref 80.0–100.0)
Monocytes Absolute: 4.3 10*3/uL — ABNORMAL HIGH (ref 0.1–1.0)
Monocytes Relative: 4 %
Neutro Abs: 3.4 10*3/uL (ref 1.7–7.7)
Neutrophils Relative %: 3 %
Platelet Count: 108 10*3/uL — ABNORMAL LOW (ref 150–400)
RBC: 4.2 MIL/uL — ABNORMAL LOW (ref 4.22–5.81)
RDW: 16.6 % — ABNORMAL HIGH (ref 11.5–15.5)
Smear Review: NORMAL
WBC Count: 117.1 10*3/uL (ref 4.0–10.5)
nRBC: 0 % (ref 0.0–0.2)

## 2023-12-24 LAB — CMP (CANCER CENTER ONLY)
ALT: 18 U/L (ref 0–44)
AST: 21 U/L (ref 15–41)
Albumin: 4.4 g/dL (ref 3.5–5.0)
Alkaline Phosphatase: 67 U/L (ref 38–126)
Anion gap: 6 (ref 5–15)
BUN: 27 mg/dL — ABNORMAL HIGH (ref 8–23)
CO2: 27 mmol/L (ref 22–32)
Calcium: 9.1 mg/dL (ref 8.9–10.3)
Chloride: 108 mmol/L (ref 98–111)
Creatinine: 1.29 mg/dL — ABNORMAL HIGH (ref 0.61–1.24)
GFR, Estimated: >60 mL/min (ref >60)
Glucose, Bld: 89 mg/dL (ref 70–99)
Potassium: 4.3 mmol/L (ref 3.5–5.1)
Sodium: 141 mmol/L (ref 135–145)
Total Bilirubin: 0.7 mg/dL (ref 0.0–1.2)
Total Protein: 7.4 g/dL (ref 6.5–8.1)

## 2023-12-24 LAB — URIC ACID: Uric Acid, Serum: 4.6 mg/dL (ref 3.7–8.6)

## 2023-12-24 LAB — LACTATE DEHYDROGENASE: LDH: 180 U/L (ref 98–192)

## 2023-12-24 MED ORDER — SODIUM CHLORIDE 0.9 % IV SOLN: INTRAVENOUS | Status: DC

## 2023-12-24 MED ORDER — SODIUM CHLORIDE 0.9 % IV SOLN: Freq: Once | INTRAVENOUS | Status: DC | PRN

## 2023-12-24 MED ORDER — SODIUM CHLORIDE 0.9 % IV SOLN
100.0000 mg | Freq: Once | INTRAVENOUS | Status: AC
  Administered 2023-12-24: 100 mg via INTRAVENOUS
  Filled 2023-12-24: qty 4

## 2023-12-24 MED ORDER — ACETAMINOPHEN 325 MG PO TABS
650.0000 mg | ORAL_TABLET | Freq: Once | ORAL | Status: AC
  Administered 2023-12-24: 650 mg via ORAL
  Filled 2023-12-24: qty 2

## 2023-12-24 MED ORDER — DIPHENHYDRAMINE HCL 50 MG/ML IJ SOLN
50.0000 mg | Freq: Once | INTRAMUSCULAR | Status: AC
  Administered 2023-12-24: 50 mg via INTRAVENOUS
  Filled 2023-12-24: qty 1

## 2023-12-24 MED ORDER — SODIUM CHLORIDE 0.9 % IV SOLN
20.0000 mg | Freq: Once | INTRAVENOUS | Status: AC
  Administered 2023-12-24: 20 mg via INTRAVENOUS
  Filled 2023-12-24: qty 20

## 2023-12-24 MED ORDER — PROCHLORPERAZINE MALEATE 10 MG PO TABS: 10.0000 mg | ORAL_TABLET | Freq: Four times a day (QID) | ORAL | 0 refills | Status: AC | PRN

## 2023-12-24 MED ORDER — HEPARIN SOD (PORK) LOCK FLUSH 100 UNIT/ML IV SOLN: 500.0000 [IU] | Freq: Once | INTRAVENOUS | Status: DC | PRN

## 2023-12-24 MED ORDER — FAMOTIDINE IN NACL 20-0.9 MG/50ML-% IV SOLN
20.0000 mg | Freq: Once | INTRAVENOUS | Status: AC | PRN
  Administered 2023-12-24: 20 mg via INTRAVENOUS

## 2023-12-24 MED ORDER — FAMOTIDINE IN NACL 20-0.9 MG/50ML-% IV SOLN
20.0000 mg | Freq: Once | INTRAVENOUS | Status: AC
Start: 2023-12-24 — End: 2023-12-24
  Administered 2023-12-24: 20 mg via INTRAVENOUS
  Filled 2023-12-24: qty 50

## 2023-12-24 MED ORDER — ONDANSETRON HCL 8 MG PO TABS: 8.0000 mg | ORAL_TABLET | Freq: Three times a day (TID) | ORAL | 0 refills | Status: AC | PRN

## 2023-12-24 MED ORDER — SODIUM CHLORIDE 0.9% FLUSH: 10.0000 mL | INTRAVENOUS | Status: DC | PRN

## 2023-12-24 MED FILL — Dexamethasone Sodium Phosphate Inj 100 MG/10ML: INTRAMUSCULAR | Qty: 2 | Status: AC

## 2023-12-24 NOTE — Progress Notes (Signed)
    DATE:  12/24/23                                        X CHEMO/IMMUNOTHERAPY REACTION           MD: Leonides Schanz   AGENT/BLOOD PRODUCT RECEIVING TODAY:              gazyva   AGENT/BLOOD PRODUCT RECEIVING IMMEDIATELY PRIOR TO REACTION:          gazyva    Vitals:   12/24/23 1147 12/24/23 1151 12/24/23 1219  BP: (!) 221/91 (!) 220/95 (!) 163/82  Pulse: 90 81   Resp: 18 18   SpO2: 100% 100%       REACTION(S):           nausea and vomiting, diaphoresis   PREMEDS:     Pepcid 20 mg IV, Decadron 20 mg IV, Benadryl 50 mg IV   INTERVENTION: Pepcid 20 mg IV   Review of Systems  Review of Systems  Constitutional:  Positive for diaphoresis.  Gastrointestinal:  Positive for nausea and vomiting.  All other systems reviewed and are negative.    Physical Exam  Physical Exam Vitals and nursing note reviewed.  Constitutional:      Appearance: He is diaphoretic. He is not ill-appearing or toxic-appearing.  HENT:     Head: Normocephalic.  Eyes:     Conjunctiva/sclera: Conjunctivae normal.  Cardiovascular:     Rate and Rhythm: Normal rate and regular rhythm.     Pulses: Normal pulses.     Heart sounds: Normal heart sounds.  Pulmonary:     Effort: Pulmonary effort is normal.     Breath sounds: Normal breath sounds.  Abdominal:     General: There is no distension.  Musculoskeletal:     Cervical back: Normal range of motion.  Skin:    General: Skin is warm.  Neurological:     Mental Status: He is alert.     OUTCOME:                  Patient became symptomatic 1 hour into first time gazyva infusion. Emergency medications were administered as documented above. Patient hypertensive initially. Vitals and patient returned to baseline. Oncologist notified and agrees to resume treatment. Patient tolerated remainder of treatment.  I have spent a total of 10 minutes minutes of face-to-face and non-face-to-face time preparing to see the patient, performing a medically appropriate  examination, counseling and educating the patient, ordering medications, documenting clinical information in the electronic health record, and care coordination.

## 2023-12-24 NOTE — Progress Notes (Signed)
Orders received to add famotidine 20 mg IVPB to [facility] plan due to cross sensitivity allergy of rituximab.  Received famotidine 20 mg IVPB with rituximab and did well.  T.O. Dr Jenell Milliner, PharmD

## 2023-12-24 NOTE — Progress Notes (Signed)
Hypersensitivity Reaction note  Date of event: 12/24/23 Time of event: 1146 Generic name of drug involved: Gazyva Name of provider notified of the hypersensitivity reaction: Cody Sewer  MD Was agent that likely caused hypersensitivity reaction added to Allergies List within EMR? Yes Chain of events including reaction signs/symptoms, treatment administered, and outcome (e.g., drug resumed; drug discontinued; sent to Emergency Department; etc.)   1hr into first dose of Gazyva, pt experienced an episode of vomiting and diaphoresis. Pt became hypertensive at 221/93. Pepcid and IVF given open to gravity. Infusion successfully resumed (per MD) once BP returned to baseline. Pt tolerated remainder of infusion without issue.   Cody Roam, RN 12/24/2023 2:09 PM

## 2023-12-24 NOTE — Patient Instructions (Signed)
[facility] A DE[facility] OF [facility]  Discharge Instructions: Thank you for choosing [facility] to provide your oncology and hematology care.   If you have a lab appointment with the Cancer Center, please go directly to the Cancer Center and check in at the registration area.   Wear comfortable clothing and clothing appropriate for easy access to any Portacath or PICC line.   We strive to give you quality time with your provider. You may need to reschedule your appointment if you arrive late (15 or more minutes).  Arriving late affects you and other patients whose appointments are after yours.  Also, if you miss three or more appointments without notifying the office, you may be dismissed from the clinic at the provider's discretion.      For prescription refill requests, have your pharmacy contact our office and allow 72 hours for refills to be completed.    Today you received the following chemotherapy and/or immunotherapy agents: Gazyva.      To help prevent nausea and vomiting after your treatment, we encourage you to take your nausea medication as directed.  BELOW ARE SYMPTOMS THAT SHOULD BE REPORTED IMMEDIATELY: *FEVER GREATER THAN 100.4 F (38 C) OR HIGHER *CHILLS OR SWEATING *NAUSEA AND VOMITING THAT IS NOT CONTROLLED WITH YOUR NAUSEA MEDICATION *UNUSUAL SHORTNESS OF BREATH *UNUSUAL BRUISING OR BLEEDING *URINARY PROBLEMS (pain or burning when urinating, or frequent urination) *BOWEL PROBLEMS (unusual diarrhea, constipation, pain near the anus) TENDERNESS IN MOUTH AND THROAT WITH OR WITHOUT PRESENCE OF ULCERS (sore throat, sores in mouth, or a toothache) UNUSUAL RASH, SWELLING OR PAIN  UNUSUAL VAGINAL DISCHARGE OR ITCHING   Items with * indicate a potential emergency and should be followed up as soon as possible or go to the Emergency Department if any problems should occur.  Please show the CHEMOTHERAPY ALERT CARD or IMMUNOTHERAPY  ALERT CARD at check-in to the Emergency Department and triage nurse.  Should you have questions after your visit or need to cancel or reschedule your appointment, please contact [facility]  Dept: [phone]  and follow the prompts.  Office hours are 8:00 a.m. to 4:30 p.m. Monday - Friday. Please note that voicemails left after 4:00 p.m. may not be returned until the following business day.  We are closed weekends and major holidays. You have access to a nurse at all times for urgent questions. Please call the main number to the clinic Dept: [phone] and follow the prompts.   For any non-urgent questions, you may also contact your provider using MyChart. We now offer e-Visits for anyone 47 and older to request care online for non-urgent symptoms. For details visit [email or web address].   Also download the MyChart app! Go to the app store, search "MyChart", open the app, select [place], and log in with your MyChart username and password.  Obinutuzumab Injection What is this medication? OBINUTUZUMAB (OH bi nue TOOZ ue mab) treats leukemia and lymphoma. It works by blocking a protein that causes cancer cells to grow and multiply. This helps to slow or stop the spread of cancer cells. It is a monoclonal antibody. This medicine may be used for other purposes; ask your health care provider or pharmacist if you have questions. COMMON BRAND NAME(S): GAZYVA What should I tell my care team before I take this medication? They need to know if you have any of these  conditions: Heart disease Infection, especially a viral infection, such as hepatitis B Lung or breathing disease Take medications that treat or prevent blood clots An unusual or allergic reaction to obinutuzumab, other medications, foods, dyes, or preservatives Pregnant or trying to get pregnant Breastfeeding How should I use this medication? This medication is for infusion  into a vein. It is given by a care team in a hospital or clinic setting. Talk to your care team about the use of this medication in children. Special care may be needed. Overdosage: If you think you have taken too much of this medicine contact a poison control center or emergency room at once. NOTE: This medicine is only for you. Do not share this medicine with others. What if I miss a dose? Keep appointments for follow-up doses as directed. It is important not to miss your dose. Call your care team if you are unable to keep an appointment. What may interact with this medication? Live virus vaccines This list may not describe all possible interactions. Give your health care provider a list of all the medicines, herbs, non-prescription drugs, or dietary supplements you use. Also tell them if you smoke, drink alcohol, or use illegal drugs. Some items may interact with your medicine. What should I watch for while using this medication? Report any side effects that you notice during your treatment right away, such as changes in your breathing, fever, chills, dizziness or lightheadedness. These effects are more common with the first dose. Visit your care team for checks on your progress. You will need to have regular blood work. Report any other side effects. The side effects of this medication can continue after you finish your treatment. Continue your course of treatment even though you feel ill unless your care team tells you to stop. Call your care team for advice if you get a fever, chills or sore throat, or other symptoms of a cold or flu. Do not treat yourself. This medication decreases your body's ability to fight infections. Try to avoid being around people who are sick. This medication may increase your risk to bruise or bleed. Call your care team if you notice any unusual bleeding. Do not become pregnant while taking this medication or for 6 months after stopping it. Inform your care team if you  wish to become pregnant or think you might be pregnant. There is a potential for serious side effects to an unborn child. Talk to your care team or pharmacist for more information. Do not breast-feed an infant while taking this medication or for 6 months after stopping it. What side effects may I notice from receiving this medication? Side effects that you should report to your care team as soon as possible: Allergic reactions--skin rash, itching, hives, swelling of the face, lips, tongue, or throat Bleeding--bloody or black, tar-like stools, vomiting blood or brown material that looks like coffee grounds, red or dark brown urine, small red or purple spots on skin, unusual bruising or bleeding Blood clot--pain, swelling, or warmth in the leg, shortness of breath, chest pain Dizziness, loss of balance or coordination, confusion or trouble speaking Infection--fever, chills, cough, sore throat, wounds that don't heal, pain or trouble when passing urine, general feeling of discomfort or being unwell Infusion reactions--chest pain, shortness of breath or trouble breathing, feeling faint or lightheaded Liver injury--right upper belly pain, loss of appetite, nausea, light-colored stool, dark yellow or brown urine, yellowing skin or eyes, unusual weakness or fatigue Tumor lysis syndrome (TLS)--nausea, vomiting, diarrhea, decrease  in the amount of urine, dark urine, unusual weakness or fatigue, confusion, muscle pain or cramps, fast or irregular heartbeat, joint pain Side effects that usually do not require medical attention (report to your care team if they continue or are bothersome): Bone, joint, or muscle pain Constipation Diarrhea Fatigue Runny or stuffy nose Sore throat This list may not describe all possible side effects. Call your doctor for medical advice about side effects. You may report side effects to FDA at 1-800-FDA-1088. Where should I keep my medication? This medication is only given in a  hospital or clinic and will not be stored at home. NOTE: This sheet is a summary. It may not cover all possible information. If you have questions about this medicine, talk to your doctor, pharmacist, or health care provider.  2024 Elsevier/Gold Standard (2022-04-02 00:00:00)

## 2023-12-24 NOTE — Progress Notes (Signed)
Per chart notes, Patient is no longer taking Brukinsa. [facility] x 1 month regarding Venclexta. Dr. Leonides Schanz would like to see how patient handles IV treatment at this time.

## 2023-12-25 ENCOUNTER — Other Ambulatory Visit: Payer: Self-pay | Admitting: Hematology and Oncology

## 2023-12-25 ENCOUNTER — Inpatient Hospital Stay: Payer: Medicare PPO

## 2023-12-25 ENCOUNTER — Encounter: Payer: Self-pay | Admitting: Hematology and Oncology

## 2023-12-25 VITALS — BP 181/89 | HR 89 | Temp 98.7°F | Resp 16

## 2023-12-25 DIAGNOSIS — C9110 Chronic lymphocytic leukemia of B-cell type not having achieved remission: Secondary | ICD-10-CM

## 2023-12-25 DIAGNOSIS — Z79899 Other long term (current) drug therapy: Secondary | ICD-10-CM | POA: Diagnosis not present

## 2023-12-25 DIAGNOSIS — I10 Essential (primary) hypertension: Secondary | ICD-10-CM | POA: Diagnosis not present

## 2023-12-25 DIAGNOSIS — Z5112 Encounter for antineoplastic immunotherapy: Secondary | ICD-10-CM | POA: Diagnosis not present

## 2023-12-25 MED ORDER — ACETAMINOPHEN 325 MG PO TABS
650.0000 mg | ORAL_TABLET | Freq: Once | ORAL | Status: AC
  Administered 2023-12-25: 650 mg via ORAL
  Filled 2023-12-25: qty 2

## 2023-12-25 MED ORDER — FAMOTIDINE IN NACL 20-0.9 MG/50ML-% IV SOLN
20.0000 mg | Freq: Once | INTRAVENOUS | Status: AC
  Administered 2023-12-25: 20 mg via INTRAVENOUS
  Filled 2023-12-25: qty 50

## 2023-12-25 MED ORDER — DIPHENHYDRAMINE HCL 50 MG/ML IJ SOLN
50.0000 mg | Freq: Once | INTRAMUSCULAR | Status: AC
  Administered 2023-12-25: 50 mg via INTRAVENOUS
  Filled 2023-12-25: qty 1

## 2023-12-25 MED ORDER — SODIUM CHLORIDE 0.9 % IV SOLN
900.0000 mg | Freq: Once | INTRAVENOUS | Status: AC
  Administered 2023-12-25: 900 mg via INTRAVENOUS
  Filled 2023-12-25: qty 36

## 2023-12-25 MED ORDER — MONTELUKAST SODIUM 10 MG PO TABS
10.0000 mg | ORAL_TABLET | Freq: Once | ORAL | Status: AC
Start: 2023-12-25 — End: 2023-12-25
  Administered 2023-12-25: 10 mg via ORAL
  Filled 2023-12-25: qty 1

## 2023-12-25 MED ORDER — SODIUM CHLORIDE 0.9 % IV SOLN
INTRAVENOUS | Status: DC
Start: 2023-12-25 — End: 2023-12-25

## 2023-12-25 MED ORDER — SODIUM CHLORIDE 0.9 % IV SOLN
20.0000 mg | Freq: Once | INTRAVENOUS | Status: AC
  Administered 2023-12-25: 20 mg via INTRAVENOUS
  Filled 2023-12-25: qty 20

## 2023-12-25 NOTE — Patient Instructions (Signed)
[facility]  Discharge Instructions: Thank you for choosing [facility] to provide your oncology and hematology care.   If you have a lab appointment with the Cancer Center, please go directly to the Cancer Center and check in at the registration area.   Wear comfortable clothing and clothing appropriate for easy access to any Portacath or PICC line.   We strive to give you quality time with your provider. You may need to reschedule your appointment if you arrive late (15 or more minutes).  Arriving late affects you and other patients whose appointments are after yours.  Also, if you miss three or more appointments without notifying the office, you may be dismissed from the clinic at the provider's discretion.      For prescription refill requests, have your pharmacy contact our office and allow 72 hours for refills to be completed.    Today you received the following chemotherapy and/or immunotherapy agents Gazyva   To help prevent nausea and vomiting after your treatment, we encourage you to take your nausea medication as directed.  BELOW ARE SYMPTOMS THAT SHOULD BE REPORTED IMMEDIATELY: *FEVER GREATER THAN 100.4 F (38 C) OR HIGHER *CHILLS OR SWEATING *NAUSEA AND VOMITING THAT IS NOT CONTROLLED WITH YOUR NAUSEA MEDICATION *UNUSUAL SHORTNESS OF BREATH *UNUSUAL BRUISING OR BLEEDING *URINARY PROBLEMS (pain or burning when urinating, or frequent urination) *BOWEL PROBLEMS (unusual diarrhea, constipation, pain near the anus) TENDERNESS IN MOUTH AND THROAT WITH OR WITHOUT PRESENCE OF ULCERS (sore throat, sores in mouth, or a toothache) UNUSUAL RASH, SWELLING OR PAIN  UNUSUAL VAGINAL DISCHARGE OR ITCHING   Items with * indicate a potential emergency and should be followed up as soon as possible or go to the Emergency Department if any problems should occur.  Please show the CHEMOTHERAPY ALERT CARD or IMMUNOTHERAPY ALERT  CARD at check-in to the Emergency Department and triage nurse.  Should you have questions after your visit or need to cancel or reschedule your appointment, please contact [facility]  Dept: [phone]  and follow the prompts.  Office hours are 8:00 a.m. to 4:30 p.m. Monday - Friday. Please note that voicemails left after 4:00 p.m. may not be returned until the following business day.  We are closed weekends and major holidays. You have access to a nurse at all times for urgent questions. Please call the main number to the clinic Dept: [phone] and follow the prompts.   For any non-urgent questions, you may also contact your provider using MyChart. We now offer e-Visits for anyone 76 and older to request care online for non-urgent symptoms. For details visit [email or web address].   Also download the MyChart app! Go to the app store, search "MyChart", open the app, select [place], and log in with your MyChart username and password.

## 2023-12-25 NOTE — Progress Notes (Signed)
Adding Singulair 10 mg po x 1 to premeds for today's Gazyva due to reaction with day 1.  T.O. Dr Jenell Milliner, PharmD

## 2023-12-26 ENCOUNTER — Encounter: Payer: Self-pay | Admitting: Hematology and Oncology

## 2023-12-28 ENCOUNTER — Telehealth: Payer: Self-pay

## 2023-12-28 ENCOUNTER — Encounter: Payer: Self-pay | Admitting: Hematology and Oncology

## 2023-12-28 ENCOUNTER — Other Ambulatory Visit: Payer: Self-pay | Admitting: Physician Assistant

## 2023-12-28 NOTE — Telephone Encounter (Signed)
-----   Message from Nurse [facility] sent at 12/25/2023  4:55 PM EST ----- Regarding: FW: Dr Leonides Schanz Pt, first time [place] from 12/24/20. Here for day 2 treatment. ----- Message ----- From: Rae Roam, RN Sent: 12/24/2023   3:24 PM EST To: [facility] Triage Nurse [facility] Subject: Dr Leonides Schanz Pt, first time [facility]                Dr Leonides Schanz pt came in 12/24/23 for first time [place]. Had an episode of diaphoresis and vomiting one hour into treatment. Received Pepcid and IVF and was able to tolerate remainder of infusion without issue.

## 2023-12-28 NOTE — Telephone Encounter (Signed)
Sister [place] Blas states that Cody [name] is doing fine. He is eating, drinking, and urinating well. They know to call the office at [phone] if they have any questions or concerns.

## 2023-12-29 MED FILL — Dexamethasone Sodium Phosphate Inj 100 MG/10ML: INTRAMUSCULAR | Qty: 2 | Status: AC

## 2023-12-30 ENCOUNTER — Inpatient Hospital Stay: Payer: Medicare PPO | Attending: Hematology and Oncology

## 2023-12-30 ENCOUNTER — Inpatient Hospital Stay: Payer: Medicare PPO | Attending: Hematology and Oncology | Admitting: Physician Assistant

## 2023-12-30 ENCOUNTER — Encounter: Payer: Self-pay | Admitting: Hematology and Oncology

## 2023-12-30 VITALS — BP 159/75 | HR 75 | Temp 97.5°F | Resp 18 | Ht 72.0 in | Wt 191.1 lb

## 2023-12-30 VITALS — BP 170/81 | HR 83 | Temp 97.5°F | Resp 16

## 2023-12-30 DIAGNOSIS — C9110 Chronic lymphocytic leukemia of B-cell type not having achieved remission: Secondary | ICD-10-CM

## 2023-12-30 DIAGNOSIS — Z79899 Other long term (current) drug therapy: Secondary | ICD-10-CM | POA: Diagnosis not present

## 2023-12-30 DIAGNOSIS — Z5112 Encounter for antineoplastic immunotherapy: Secondary | ICD-10-CM | POA: Diagnosis not present

## 2023-12-30 LAB — CBC WITH DIFFERENTIAL (CANCER CENTER ONLY)
Abs Immature Granulocytes: 0.03 10*3/uL (ref 0.00–0.07)
Basophils Absolute: 0 10*3/uL (ref 0.0–0.1)
Basophils Relative: 0 %
Eosinophils Absolute: 0.1 10*3/uL (ref 0.0–0.5)
Eosinophils Relative: 1 %
HCT: 36.2 % — ABNORMAL LOW (ref 39.0–52.0)
Hemoglobin: 11.3 g/dL — ABNORMAL LOW (ref 13.0–17.0)
Immature Granulocytes: 1 %
Lymphocytes Relative: 58 %
Lymphs Abs: 3.2 10*3/uL (ref 0.7–4.0)
MCH: 26.2 pg (ref 26.0–34.0)
MCHC: 31.2 g/dL (ref 30.0–36.0)
MCV: 84 fL (ref 80.0–100.0)
Monocytes Absolute: 0.2 10*3/uL (ref 0.1–1.0)
Monocytes Relative: 4 %
Neutro Abs: 2 10*3/uL (ref 1.7–7.7)
Neutrophils Relative %: 36 %
Platelet Count: 83 10*3/uL — ABNORMAL LOW (ref 150–400)
RBC: 4.31 MIL/uL (ref 4.22–5.81)
RDW: 16.2 % — ABNORMAL HIGH (ref 11.5–15.5)
WBC Count: 5.5 10*3/uL (ref 4.0–10.5)
nRBC: 0 % (ref 0.0–0.2)

## 2023-12-30 LAB — LACTATE DEHYDROGENASE: LDH: 137 U/L (ref 98–192)

## 2023-12-30 LAB — CMP (CANCER CENTER ONLY)
ALT: 21 U/L (ref 0–44)
AST: 18 U/L (ref 15–41)
Albumin: 4.2 g/dL (ref 3.5–5.0)
Alkaline Phosphatase: 57 U/L (ref 38–126)
Anion gap: 4 — ABNORMAL LOW (ref 5–15)
BUN: 31 mg/dL — ABNORMAL HIGH (ref 8–23)
CO2: 30 mmol/L (ref 22–32)
Calcium: 9.5 mg/dL (ref 8.9–10.3)
Chloride: 105 mmol/L (ref 98–111)
Creatinine: 1.06 mg/dL (ref 0.61–1.24)
GFR, Estimated: >60 mL/min (ref >60)
Glucose, Bld: 102 mg/dL — ABNORMAL HIGH (ref 70–99)
Potassium: 4.4 mmol/L (ref 3.5–5.1)
Sodium: 139 mmol/L (ref 135–145)
Total Bilirubin: 0.8 mg/dL (ref 0.0–1.2)
Total Protein: 6.9 g/dL (ref 6.5–8.1)

## 2023-12-30 LAB — URIC ACID: Uric Acid, Serum: 4.9 mg/dL (ref 3.7–8.6)

## 2023-12-30 MED ORDER — DIPHENHYDRAMINE HCL 50 MG/ML IJ SOLN
50.0000 mg | Freq: Once | INTRAMUSCULAR | Status: AC
  Administered 2023-12-30: 50 mg via INTRAVENOUS
  Filled 2023-12-30: qty 1

## 2023-12-30 MED ORDER — FAMOTIDINE IN NACL 20-0.9 MG/50ML-% IV SOLN
20.0000 mg | Freq: Once | INTRAVENOUS | Status: AC
  Administered 2023-12-30: 20 mg via INTRAVENOUS
  Filled 2023-12-30: qty 50

## 2023-12-30 MED ORDER — SODIUM CHLORIDE 0.9 % IV SOLN
INTRAVENOUS | Status: DC
Start: 2023-12-30 — End: 2023-12-30

## 2023-12-30 MED ORDER — SODIUM CHLORIDE 0.9 % IV SOLN
20.0000 mg | Freq: Once | INTRAVENOUS | Status: AC
  Administered 2023-12-30: 20 mg via INTRAVENOUS
  Filled 2023-12-30: qty 20

## 2023-12-30 MED ORDER — ACETAMINOPHEN 325 MG PO TABS
650.0000 mg | ORAL_TABLET | Freq: Once | ORAL | Status: AC
  Administered 2023-12-30: 650 mg via ORAL
  Filled 2023-12-30: qty 2

## 2023-12-30 MED ORDER — SODIUM CHLORIDE 0.9 % IV SOLN
1000.0000 mg | Freq: Once | INTRAVENOUS | Status: AC
  Administered 2023-12-30: 1000 mg via INTRAVENOUS
  Filled 2023-12-30: qty 40

## 2023-12-30 NOTE — Progress Notes (Signed)
 [facility] [phone]   [phone]  PROGRESS NOTE  Patient Care Team: Leigh Lung, MD as PCP - General (Family Medicine) Federico Norleen ONEIDA MADISON, MD as Consulting Physician (Hematology and Oncology)  Hematological/Oncological History # CLL --diagnosis of chronic lymphoid leukemia made 11/04/2018             (a) the cells are positive for CD 04/12/19/22/23, kappa restricted   (1) Rituximab  weekly x 9 weeks beginning 11/23/2018             (a) reaction to first dose (which was 100 mg only).   (2) on maintenance rituximab , first dose 03/07/2019, repeated every 3 months             (a) baseline PET scan obtained on 02/16/2020 was Deauville 2 except for the left iliac nodes, with SUV max 3.1             (b) rituximab  decreased to every 6 months beginning October 2021   (3) Switched to Zanubrutinib  160 mg PO BID in July 2024.   (4) Switched to Obinutuzumab  (Gazyva ) started on 12/24/2023.   Interval History:  Cody [name] is a 68 year old male with Chronic Lymphocytic Leukemia (CLL) who presents for a follow-up visit. He was last seen on 12/08/2023 and in the interim started Gazyva  treatment. He presents today for Cycle 1, Day 8.  He is unaccompanied for this visit.    Cody [name] reports he is doing well without any new or concerning symptoms.  He reports his energy and appetite are overall stable.  He is eating well without any weight changes.  He denies nausea, vomiting or abdominal pain.  He denies easy bruising or signs of active bleeding.  Overall, he is at his baseline level of health.  He denies fevers, chills, sweats, shortness of breath or cough. He has no other complaints. A full 10 point ROS is listed below.  MEDICAL HISTORY:  Past Medical History:  Diagnosis Date   Alcoholic cirrhosis (HCC)    Cataract    Chronic lymphoid leukemia (HCC)    Diabetes mellitus without complication (HCC)    High blood pressure 10/24/2004   History of  alcohol abuse    Hypercholesterolemia     SURGICAL HISTORY: No past surgical history on file.  SOCIAL HISTORY: Social History   Socioeconomic History   Marital status: Divorced    Spouse name: Not on file   Number of children: Not on file   Years of education: Not on file   Highest education level: Not on file  Occupational History   Not on file  Tobacco Use   Smoking status: Never   Smokeless tobacco: Never  Substance and Sexual Activity   Alcohol use: Not Currently   Drug use: No   Sexual activity: Not on file  Other Topics Concern   Not on file  Social History Narrative   Not on file   Social Drivers of Health   Financial Resource Strain: Not on file  Food Insecurity: No Food Insecurity (10/01/2023)   Hunger Vital Sign    Worried About Running Out of Food in the Last Year: Never true    Ran Out of Food in the Last Year: Never true  Transportation Needs: No Transportation Needs (10/01/2023)   PRAPARE - Administrator, Civil Service (Medical): No    Lack of Transportation (Non-Medical): No  Physical Activity: Not on file  Stress: Not on file  Social Connections:  Not on file  Intimate Partner Violence: Not At Risk (10/01/2023)   Humiliation, Afraid, Rape, and Kick questionnaire    Fear of Current or Ex-Partner: No    Emotionally Abused: No    Physically Abused: No    Sexually Abused: No    FAMILY HISTORY: Family History  Problem Relation Age of Onset   Alcohol abuse Father    Cirrhosis Father    Coronary artery disease Father     ALLERGIES:  is allergic to gazyva  [obinutuzumab ] and rituxan  [rituximab ].  MEDICATIONS:  Current Outpatient Medications  Medication Sig Dispense Refill   ACCU-CHEK GUIDE TEST test strip USE TO CHECK BLOOD SUGAR UP TO FOUR TIMES PER DAY.     allopurinol  (ZYLOPRIM ) 300 MG tablet TAKE 1 TABLET DAILY (Patient taking differently: Take 300 mg by mouth in the morning.) 90 tablet 3   amLODipine  (NORVASC ) 2.5 MG tablet Take  2.5 mg by mouth at bedtime.     aspirin  EC 81 MG tablet Take 1 tablet (81 mg total) by mouth daily. Swallow whole. 30 tablet 12   atorvastatin  (LIPITOR) 20 MG tablet Take 1 tablet (20 mg total) by mouth at bedtime. 30 tablet 3   B-D UF III MINI PEN NEEDLES 31G X 5 MM MISC      Blood Glucose Monitoring Suppl (ACCU-CHEK GUIDE) w/Device KIT      carvedilol  (COREG ) 3.125 MG tablet Take 1 tablet (3.125 mg total) by mouth 2 (two) times daily. 60 tablet 3   dapagliflozin  propanediol (FARXIGA ) 10 MG TABS tablet Take 1 tablet (10 mg total) by mouth daily. 30 tablet 3   fexofenadine (ALLEGRA) 180 MG tablet Take 180 mg by mouth at bedtime.     hydrochlorothiazide (HYDRODIURIL) 25 MG tablet Take 25 mg by mouth daily.     losartan  (COZAAR ) 50 MG tablet Take 50 mg by mouth 2 (two) times daily.     metFORMIN  (GLUCOPHAGE -XR) 500 MG 24 hr tablet Take 1 tablet (500 mg total) by mouth 2 (two) times daily with a meal. Take 500 mg by mouth in the morning and 1,000 mg at bedtime (Patient taking differently: Take 500-1,000 mg by mouth See admin instructions. Take 500 mg by mouth in the morning and 1,000 mg at bedtime)     ondansetron  (ZOFRAN ) 8 MG tablet Take 1 tablet (8 mg total) by mouth every 8 (eight) hours as needed. 30 tablet 0   ONETOUCH DELICA LANCETS FINE MISC 3 (three) times daily.   5   prochlorperazine  (COMPAZINE ) 10 MG tablet Take 1 tablet (10 mg total) by mouth every 6 (six) hours as needed for nausea or vomiting. 30 tablet 0   TOUJEO  SOLOSTAR 300 UNIT/ML SOPN Inject 0-8 Units into the skin See admin instructions. Inject 0-8 units into the skin in the morning and at bedtime, PER SLIDING SCALE: BGL: 0-99 = give nothing; 100-150 = 4 units; 151-200 = 6 units; 201-250 = 8 units     venetoclax  (VENCLEXTA ) 10 & 50 & 100 MG Starter Pack Take by mouth daily. Take 20 mg for 7 days, then 50 mg daily x 7d, then 100 mg daily x 7d, then 200 mg daily x 7d. Take with food & water. 42 tablet 0   zanubrutinib  (BRUKINSA ) 80 MG  capsule Take 2 capsules (160 mg total) by mouth 2 (two) times daily. 120 capsule 2   No current facility-administered medications for this visit.    REVIEW OF SYSTEMS:   Constitutional: ( - ) fevers, ( - )  chills , ( - ) night sweats Eyes: ( - ) blurriness of vision, ( - ) double vision, ( - ) watery eyes Ears, nose, mouth, throat, and face: ( - ) mucositis, ( - ) sore throat Respiratory: ( - ) cough, ( - ) dyspnea, ( - ) wheezes Cardiovascular: ( - ) palpitation, ( - ) chest discomfort, ( - ) lower extremity swelling Gastrointestinal:  ( - ) nausea, ( - ) heartburn, ( - ) change in bowel habits Skin: ( - ) abnormal skin rashes Lymphatics: ( - ) new lymphadenopathy, ( - ) easy bruising Neurological: ( - ) numbness, ( - ) tingling, ( - ) new weaknesses Behavioral/Psych: ( - ) mood change, ( - ) new changes  All other systems were reviewed with the patient and are negative.  PHYSICAL EXAMINATION: ECOG PERFORMANCE STATUS: 1 - Symptomatic but completely ambulatory  Vitals:   12/30/23 0900  BP: (!) 159/75  Pulse: 75  Resp: 18  Temp: (!) 97.5 F (36.4 C)  SpO2: 100%      Filed Weights   12/30/23 0900  Weight: 191 lb 1.6 oz (86.7 kg)     GENERAL: Well-appearing elderly male, alert, no distress and comfortable SKIN: skin color, texture, turgor are normal, no rashes or significant lesions EYES: conjunctiva are pink and non-injected, sclera clear LYMPH:  no palpable lymphadenopathy in the cervical or supraclavicular regions.  LUNGS: clear to auscultation and percussion with normal breathing effort HEART: regular rate & rhythm and no murmurs and no lower extremity edema Musculoskeletal: no cyanosis of digits and no clubbing  PSYCH: alert & oriented x 3, fluent speech NEURO: no focal motor/sensory deficits  LABORATORY DATA:  I have reviewed the data as listed    Latest Ref Rng & Units 12/30/2023    8:33 AM 12/24/2023    8:05 AM 12/08/2023   10:03 AM  CBC  WBC 4.0 - 10.5 K/uL  5.5  117.1  117.5   Hemoglobin 13.0 - 17.0 g/dL 88.6  89.2  88.5   Hematocrit 39.0 - 52.0 % 36.2  36.6  37.5   Platelets 150 - 400 K/uL 83  108  112        Latest Ref Rng & Units 12/24/2023    8:05 AM 12/08/2023   10:03 AM 11/11/2023   10:24 AM  CMP  Glucose 70 - 99 mg/dL 89  97  884   BUN 8 - 23 mg/dL 27  27  23    Creatinine 0.61 - 1.24 mg/dL 8.70  8.86  8.85   Sodium 135 - 145 mmol/L 141  139  142   Potassium 3.5 - 5.1 mmol/L 4.3  4.6  4.5   Chloride 98 - 111 mmol/L 108  105  106   CO2 22 - 32 mmol/L 27  29  31    Calcium  8.9 - 10.3 mg/dL 9.1  9.5  9.5   Total Protein 6.5 - 8.1 g/dL 7.4  7.5  6.9   Total Bilirubin 0.0 - 1.2 mg/dL 0.7  0.8  0.9   Alkaline Phos 38 - 126 U/L 67  72  71   AST 15 - 41 U/L 21  22  23    ALT 0 - 44 U/L 18  19  20      RADIOGRAPHIC STUDIES: No results found.   ASSESSMENT & PLAN Cody [name] is a 68 y.o. male with medical history significant for CLL who presents for a follow up visit.   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 Discontinued due to poor tolerance with medication.  --Switched to Zanubrutinib  160 mg PO BID starting July 2024 but recently stopped this medication as he is having progression of disease. --Started cycle 1 day 1 of Obinutuzumab  (Gazyva ) on 12/24/2023. Had an infusion reaction that resolved with pepcid , decadron  and benadryl .  Resumed remaining infusion with any issues. Added singulair  10 mg PO to premeds. PLAN: --Due for cycle 1, day 8 of Gazyva  today --Labs from today reviewed. WBC 5.5, Hgb 11.3, MCV 84, Plt 83K, Ab lymph count 3.2. Reviewed with Dr. Federico who recommends to continue with Gazyva  without any dose modifications. Since patient is well on obinutuzumab  alone, could consider that therapy alone --RTC in 3 weeks before Cycle 2, Day 1.   #Hypertension -Blood pressure readings have been  slightly elevated. Patient reports frequent urination, possibly related to antihypertensive medication (Hydrochlorothiazide). -Advise patient to discuss blood pressure readings with PCP for possible dose adjustment.  #General Health Maintenance -Encourage continued physical activity (treadmill walking).      No orders of the defined types were placed in this encounter.   All questions were answered. The patient knows to call the clinic with any problems, questions or concerns.  I have spent a total of 30 minutes minutes of face-to-face and non-face-to-face time, preparing to see the patient, performing a medically appropriate examination, counseling and educating the patient, documenting clinical information in the electronic health record,and care coordination.   Johnston Police PA-C Dept of Hematology and Oncology [facility] Phone: [phone]   12/30/2023 9:09 AM  Shermon CHRISTELLA Hearing BD, Rosemary BIRCH, Caligaris-Cappio F, Dighiero G, Dhner H, Hillmen P, Adrian, Montserrat E, Chiorazzi N, [place], Rai KR, [place], Eichhorst B, O'Brien S, Robak T, Seymour JF, Maben UG. iwCLL guidelines for diagnosis, indications for treatment, response assessment, and supportive management of CLL. Blood. 2018 Jun 21;131(25):2745-2760.  Active disease should be clearly documented to initiate therapy. At least 1 of the following criteria should be met.  1) Evidence of progressive marrow failure as manifested by the development of, or worsening of, anemia and/or thrombocytopenia. Cutoff levels of Hb <10 g/dL or platelet counts <899  109/L are generally regarded as indication for treatment. However, in some patients, platelet counts <100  109/L may remain stable over a long period; this situation does not automatically require therapeutic intervention. 2) Massive (ie, >=6 cm below the left costal margin) or progressive or symptomatic splenomegaly. 3) Massive nodes  (ie, >=10 cm in longest diameter) or progressive or symptomatic lymphadenopathy. 4) Progressive lymphocytosis with an increase of >=50% over a 82-month period, or lymphocyte doubling time (LDT) <6 months. LDT can be obtained by linear regression extrapolation of absolute lymphocyte counts obtained at intervals of 2 weeks over an observation period of 2 to 3 months; patients with initial blood lymphocyte counts <30  109/L may require a longer observation period to determine the LDT. Factors contributing to lymphocytosis other than CLL (eg, infections, steroid administration) should be excluded. 5) Autoimmune complications including anemia or thrombocytopenia poorly responsive to corticosteroids. 6) Symptomatic or functional extranodal involvement (eg, skin, kidney, lung, spine). Disease-related symptoms as defined by any of the following: Unintentional weight loss >=10% within the previous 6 months. Significant fatigue (ie, ECOG performance scale 2 or worse; cannot work or unable to perform usual activities). Fevers >=100.65F or 38.0C for 2 or  more weeks without evidence of infection. Night sweats for >=1 month without evidence of infection.

## 2023-12-30 NOTE — Patient Instructions (Signed)
 [facility] - A DEPT OF [facility]  Discharge Instructions: Thank you for choosing [facility] to provide your oncology and hematology care.   If you have a lab appointment with the Cancer Center, please go directly to the Cancer Center and check in at the registration area.   Wear comfortable clothing and clothing appropriate for easy access to any Portacath or PICC line.   We strive to give you quality time with your provider. You may need to reschedule your appointment if you arrive late (15 or more minutes).  Arriving late affects you and other patients whose appointments are after yours.  Also, if you miss three or more appointments without notifying the office, you may be dismissed from the clinic at the provider's discretion.      For prescription refill requests, have your pharmacy contact our office and allow 72 hours for refills to be completed.    Today you received the following chemotherapy and/or immunotherapy agents: obinutuzumab      To help prevent nausea and vomiting after your treatment, we encourage you to take your nausea medication as directed.  BELOW ARE SYMPTOMS THAT SHOULD BE REPORTED IMMEDIATELY: *FEVER GREATER THAN 100.4 F (38 C) OR HIGHER *CHILLS OR SWEATING *NAUSEA AND VOMITING THAT IS NOT CONTROLLED WITH YOUR NAUSEA MEDICATION *UNUSUAL SHORTNESS OF BREATH *UNUSUAL BRUISING OR BLEEDING *URINARY PROBLEMS (pain or burning when urinating, or frequent urination) *BOWEL PROBLEMS (unusual diarrhea, constipation, pain near the anus) TENDERNESS IN MOUTH AND THROAT WITH OR WITHOUT PRESENCE OF ULCERS (sore throat, sores in mouth, or a toothache) UNUSUAL RASH, SWELLING OR PAIN  UNUSUAL VAGINAL DISCHARGE OR ITCHING   Items with * indicate a potential emergency and should be followed up as soon as possible or go to the Emergency Department if any problems should occur.  Please show the CHEMOTHERAPY ALERT CARD or  IMMUNOTHERAPY ALERT CARD at check-in to the Emergency Department and triage nurse.  Should you have questions after your visit or need to cancel or reschedule your appointment, please contact [facility] - A DEPT OF [facility]  Dept: [phone]  and follow the prompts.  Office hours are 8:00 a.m. to 4:30 p.m. Monday - Friday. Please note that voicemails left after 4:00 p.m. may not be returned until the following business day.  We are closed weekends and major holidays. You have access to a nurse at all times for urgent questions. Please call the main number to the clinic Dept: [phone] and follow the prompts.   For any non-urgent questions, you may also contact your provider using MyChart. We now offer e-Visits for anyone 56 and older to request care online for non-urgent symptoms. For details visit [email or web address].   Also download the MyChart app! Go to the app store, search "MyChart", open the app, select [place], and log in with your MyChart username and password.

## 2023-12-31 ENCOUNTER — Encounter: Payer: Self-pay | Admitting: Hematology and Oncology

## 2023-12-31 ENCOUNTER — Other Ambulatory Visit: Payer: Self-pay

## 2024-01-07 MED FILL — Dexamethasone Sodium Phosphate Inj 100 MG/10ML: INTRAMUSCULAR | Qty: 2 | Status: AC

## 2024-01-08 ENCOUNTER — Inpatient Hospital Stay: Payer: Medicare PPO

## 2024-01-08 VITALS — BP 163/75 | HR 88 | Temp 98.1°F | Resp 18 | Wt 192.2 lb

## 2024-01-08 DIAGNOSIS — Z79899 Other long term (current) drug therapy: Secondary | ICD-10-CM | POA: Diagnosis not present

## 2024-01-08 DIAGNOSIS — C9110 Chronic lymphocytic leukemia of B-cell type not having achieved remission: Secondary | ICD-10-CM

## 2024-01-08 DIAGNOSIS — Z5112 Encounter for antineoplastic immunotherapy: Secondary | ICD-10-CM | POA: Diagnosis not present

## 2024-01-08 LAB — CBC WITH DIFFERENTIAL (CANCER CENTER ONLY)
Abs Immature Granulocytes: 0.03 10*3/uL (ref 0.00–0.07)
Basophils Absolute: 0 10*3/uL (ref 0.0–0.1)
Basophils Relative: 0 %
Eosinophils Absolute: 0 10*3/uL (ref 0.0–0.5)
Eosinophils Relative: 1 %
HCT: 36 % — ABNORMAL LOW (ref 39.0–52.0)
Hemoglobin: 11.2 g/dL — ABNORMAL LOW (ref 13.0–17.0)
Immature Granulocytes: 0 %
Lymphocytes Relative: 75 %
Lymphs Abs: 6.2 10*3/uL — ABNORMAL HIGH (ref 0.7–4.0)
MCH: 26.5 pg (ref 26.0–34.0)
MCHC: 31.1 g/dL (ref 30.0–36.0)
MCV: 85.1 fL (ref 80.0–100.0)
Monocytes Absolute: 0.2 10*3/uL (ref 0.1–1.0)
Monocytes Relative: 3 %
Neutro Abs: 1.7 10*3/uL (ref 1.7–7.7)
Neutrophils Relative %: 21 %
Platelet Count: 86 10*3/uL — ABNORMAL LOW (ref 150–400)
RBC: 4.23 MIL/uL (ref 4.22–5.81)
RDW: 16.6 % — ABNORMAL HIGH (ref 11.5–15.5)
WBC Count: 8.2 10*3/uL (ref 4.0–10.5)
nRBC: 0 % (ref 0.0–0.2)

## 2024-01-08 LAB — CMP (CANCER CENTER ONLY)
ALT: 20 U/L (ref 0–44)
AST: 17 U/L (ref 15–41)
Albumin: 4.2 g/dL (ref 3.5–5.0)
Alkaline Phosphatase: 61 U/L (ref 38–126)
Anion gap: 5 (ref 5–15)
BUN: 31 mg/dL — ABNORMAL HIGH (ref 8–23)
CO2: 30 mmol/L (ref 22–32)
Calcium: 9.3 mg/dL (ref 8.9–10.3)
Chloride: 104 mmol/L (ref 98–111)
Creatinine: 1.23 mg/dL (ref 0.61–1.24)
GFR, Estimated: >60 mL/min (ref >60)
Glucose, Bld: 131 mg/dL — ABNORMAL HIGH (ref 70–99)
Potassium: 4.2 mmol/L (ref 3.5–5.1)
Sodium: 139 mmol/L (ref 135–145)
Total Bilirubin: 1 mg/dL (ref 0.0–1.2)
Total Protein: 7 g/dL (ref 6.5–8.1)

## 2024-01-08 LAB — LACTATE DEHYDROGENASE: LDH: 130 U/L (ref 98–192)

## 2024-01-08 LAB — URIC ACID: Uric Acid, Serum: 4.5 mg/dL (ref 3.7–8.6)

## 2024-01-08 MED ORDER — SODIUM CHLORIDE 0.9 % IV SOLN: INTRAVENOUS | Status: DC

## 2024-01-08 MED ORDER — SODIUM CHLORIDE 0.9 % IV SOLN
1000.0000 mg | Freq: Once | INTRAVENOUS | Status: AC
  Administered 2024-01-08: 1000 mg via INTRAVENOUS
  Filled 2024-01-08: qty 40

## 2024-01-08 MED ORDER — FAMOTIDINE IN NACL 20-0.9 MG/50ML-% IV SOLN
20.0000 mg | Freq: Once | INTRAVENOUS | Status: AC
  Administered 2024-01-08: 20 mg via INTRAVENOUS
  Filled 2024-01-08: qty 50

## 2024-01-08 MED ORDER — DIPHENHYDRAMINE HCL 50 MG/ML IJ SOLN
50.0000 mg | Freq: Once | INTRAMUSCULAR | Status: AC
  Administered 2024-01-08: 50 mg via INTRAVENOUS
  Filled 2024-01-08: qty 1

## 2024-01-08 MED ORDER — ACETAMINOPHEN 325 MG PO TABS
650.0000 mg | ORAL_TABLET | Freq: Once | ORAL | Status: AC
  Administered 2024-01-08: 650 mg via ORAL
  Filled 2024-01-08: qty 2

## 2024-01-08 MED ORDER — SODIUM CHLORIDE 0.9 % IV SOLN
20.0000 mg | Freq: Once | INTRAVENOUS | Status: AC
  Administered 2024-01-08: 20 mg via INTRAVENOUS
  Filled 2024-01-08: qty 20

## 2024-01-08 NOTE — Progress Notes (Signed)
Per Dr Leonides Schanz, ok to proceed with today's tx with platelets of 86.

## 2024-01-11 NOTE — Telephone Encounter (Signed)
 Oral Oncology Pharmacist Encounter   Discussed with Dr. Leonides Schanz that patient is doing well and we will hold off on adding venetoclax at this time. I will check back before Cycle 3 to see how patient is doing and to see if this medication will be added prior to signing off.   Bethel Born, PharmD Hematology/Oncology Clinical Pharmacist [facility] [phone]

## 2024-01-12 ENCOUNTER — Encounter: Payer: Self-pay | Admitting: Hematology and Oncology

## 2024-01-21 ENCOUNTER — Other Ambulatory Visit: Payer: Self-pay

## 2024-01-22 ENCOUNTER — Other Ambulatory Visit: Payer: Medicare PPO

## 2024-01-22 ENCOUNTER — Inpatient Hospital Stay: Payer: Medicare PPO

## 2024-01-22 ENCOUNTER — Inpatient Hospital Stay (HOSPITAL_BASED_OUTPATIENT_CLINIC_OR_DEPARTMENT_OTHER): Payer: Medicare PPO | Admitting: Physician Assistant

## 2024-01-22 VITALS — BP 169/71 | HR 73 | Temp 97.9°F | Resp 16 | Wt 194.0 lb

## 2024-01-22 VITALS — BP 165/79 | HR 79 | Temp 97.8°F | Resp 16

## 2024-01-22 DIAGNOSIS — Z5112 Encounter for antineoplastic immunotherapy: Secondary | ICD-10-CM | POA: Diagnosis not present

## 2024-01-22 DIAGNOSIS — C9110 Chronic lymphocytic leukemia of B-cell type not having achieved remission: Secondary | ICD-10-CM

## 2024-01-22 DIAGNOSIS — Z79899 Other long term (current) drug therapy: Secondary | ICD-10-CM | POA: Diagnosis not present

## 2024-01-22 LAB — CBC WITH DIFFERENTIAL (CANCER CENTER ONLY)
Abs Immature Granulocytes: 0.02 10*3/uL (ref 0.00–0.07)
Basophils Absolute: 0 10*3/uL (ref 0.0–0.1)
Basophils Relative: 0 %
Eosinophils Absolute: 0 10*3/uL (ref 0.0–0.5)
Eosinophils Relative: 1 %
HCT: 38.9 % — ABNORMAL LOW (ref 39.0–52.0)
Hemoglobin: 12 g/dL — ABNORMAL LOW (ref 13.0–17.0)
Immature Granulocytes: 1 %
Lymphocytes Relative: 61 %
Lymphs Abs: 2.4 10*3/uL (ref 0.7–4.0)
MCH: 26.3 pg (ref 26.0–34.0)
MCHC: 30.8 g/dL (ref 30.0–36.0)
MCV: 85.1 fL (ref 80.0–100.0)
Monocytes Absolute: 0.1 10*3/uL (ref 0.1–1.0)
Monocytes Relative: 3 %
Neutro Abs: 1.3 10*3/uL — ABNORMAL LOW (ref 1.7–7.7)
Neutrophils Relative %: 34 %
Platelet Count: 77 10*3/uL — ABNORMAL LOW (ref 150–400)
RBC: 4.57 MIL/uL (ref 4.22–5.81)
RDW: 15.6 % — ABNORMAL HIGH (ref 11.5–15.5)
WBC Count: 3.9 10*3/uL — ABNORMAL LOW (ref 4.0–10.5)
nRBC: 0 % (ref 0.0–0.2)

## 2024-01-22 LAB — CMP (CANCER CENTER ONLY)
ALT: 20 U/L (ref 0–44)
AST: 22 U/L (ref 15–41)
Albumin: 4.3 g/dL (ref 3.5–5.0)
Alkaline Phosphatase: 70 U/L (ref 38–126)
Anion gap: 4 — ABNORMAL LOW (ref 5–15)
BUN: 26 mg/dL — ABNORMAL HIGH (ref 8–23)
CO2: 30 mmol/L (ref 22–32)
Calcium: 8.5 mg/dL — ABNORMAL LOW (ref 8.9–10.3)
Chloride: 107 mmol/L (ref 98–111)
Creatinine: 1.1 mg/dL (ref 0.61–1.24)
GFR, Estimated: >60 mL/min (ref >60)
Glucose, Bld: 143 mg/dL — ABNORMAL HIGH (ref 70–99)
Potassium: 4 mmol/L (ref 3.5–5.1)
Sodium: 141 mmol/L (ref 135–145)
Total Bilirubin: 1.1 mg/dL (ref 0.0–1.2)
Total Protein: 6.9 g/dL (ref 6.5–8.1)

## 2024-01-22 LAB — LACTATE DEHYDROGENASE: LDH: 133 U/L (ref 98–192)

## 2024-01-22 MED ORDER — SODIUM CHLORIDE 0.9 % IV SOLN
INTRAVENOUS | Status: DC
Start: 2024-01-22 — End: 2024-01-22

## 2024-01-22 MED ORDER — ACETAMINOPHEN 325 MG PO TABS
650.0000 mg | ORAL_TABLET | Freq: Once | ORAL | Status: AC
  Administered 2024-01-22: 650 mg via ORAL
  Filled 2024-01-22: qty 2

## 2024-01-22 MED ORDER — SODIUM CHLORIDE 0.9 % IV SOLN
20.0000 mg | Freq: Once | INTRAVENOUS | Status: AC
  Administered 2024-01-22: 20 mg via INTRAVENOUS
  Filled 2024-01-22: qty 20

## 2024-01-22 MED ORDER — FAMOTIDINE IN NACL 20-0.9 MG/50ML-% IV SOLN
20.0000 mg | Freq: Once | INTRAVENOUS | Status: AC
  Administered 2024-01-22: 20 mg via INTRAVENOUS
  Filled 2024-01-22: qty 50

## 2024-01-22 MED ORDER — DIPHENHYDRAMINE HCL 50 MG/ML IJ SOLN
50.0000 mg | Freq: Once | INTRAMUSCULAR | Status: AC
  Administered 2024-01-22: 50 mg via INTRAVENOUS
  Filled 2024-01-22: qty 1

## 2024-01-22 MED ORDER — SODIUM CHLORIDE 0.9 % IV SOLN
1000.0000 mg | Freq: Once | INTRAVENOUS | Status: AC
  Administered 2024-01-22: 1000 mg via INTRAVENOUS
  Filled 2024-01-22: qty 40

## 2024-01-22 NOTE — Progress Notes (Signed)
 [facility] [phone]   [phone]  PROGRESS NOTE  Patient Care Team: Mirna Mires, MD as PCP - General (Family Medicine) Jaci Standard, MD as Consulting Physician (Hematology and Oncology)  Hematological/Oncological History # CLL --diagnosis of chronic lymphoid leukemia made 11/04/2018             (a) the cells are positive for CD 04/12/19/22/23, kappa restricted   (1) Rituximab weekly x 9 weeks beginning 11/23/2018             (a) reaction to first dose (which was 100 mg only).   (2) on maintenance rituximab, first dose 03/07/2019, repeated every 3 months             (a) baseline PET scan obtained on 02/16/2020 was Deauville 2 except for the left iliac nodes, with SUV max 3.1             (b) rituximab decreased to every 6 months beginning October 2021   (3) Switched to Zanubrutinib 160 mg PO BID in July 2024.   (4) Switched to Obinutuzumab Shelly Bombard) started on 12/24/2023.   Interval History:  Cody [name] is a 68 year old male with Chronic Lymphocytic Leukemia (CLL) who presents for a follow-up visit. He was last seen on 12/30/2023 and in the interim he continues on Van treatment. He presents today for Cycle 2, Day 1.    Cody [name] reports he is doing well without any new or concerning symptoms.  He reports he is trying to stay active and walks on the treadmill for one hour per day. His appetite and weight are unchanged. He denies nausea, vomiting or abdominal pain.  He denies easy bruising or signs of active bleeding.  Overall, he is at his baseline level of health.  He denies fevers, chills, sweats, shortness of breath or cough. He has no other complaints. A full 10 point ROS is listed below.  MEDICAL HISTORY:  Past Medical History:  Diagnosis Date   Alcoholic cirrhosis (HCC)    Cataract    Chronic lymphoid leukemia (HCC)    Diabetes mellitus without complication (HCC)    High blood pressure 10/24/2004   History of alcohol  abuse    Hypercholesterolemia     SURGICAL HISTORY: No past surgical history on file.  SOCIAL HISTORY: Social History   Socioeconomic History   Marital status: Divorced    Spouse name: Not on file   Number of children: Not on file   Years of education: Not on file   Highest education level: Not on file  Occupational History   Not on file  Tobacco Use   Smoking status: Never   Smokeless tobacco: Never  Substance and Sexual Activity   Alcohol use: Not Currently   Drug use: No   Sexual activity: Not on file  Other Topics Concern   Not on file  Social History Narrative   Not on file   Social Drivers of Health   Financial Resource Strain: Not on file  Food Insecurity: No Food Insecurity (10/01/2023)   Hunger Vital Sign    Worried About Running Out of Food in the Last Year: Never true    Ran Out of Food in the Last Year: Never true  Transportation Needs: No Transportation Needs (10/01/2023)   PRAPARE - Administrator, Civil Service (Medical): No    Lack of Transportation (Non-Medical): No  Physical Activity: Not on file  Stress: Not on file  Social Connections:  Not on file  Intimate Partner Violence: Not At Risk (10/01/2023)   Humiliation, Afraid, Rape, and Kick questionnaire    Fear of Current or Ex-Partner: No    Emotionally Abused: No    Physically Abused: No    Sexually Abused: No    FAMILY HISTORY: Family History  Problem Relation Age of Onset   Alcohol abuse Father    Cirrhosis Father    Coronary artery disease Father     ALLERGIES:  is allergic to gazyva [obinutuzumab] and rituxan [rituximab].  MEDICATIONS:  Current Outpatient Medications  Medication Sig Dispense Refill   ACCU-CHEK GUIDE TEST test strip USE TO CHECK BLOOD SUGAR UP TO FOUR TIMES PER DAY.     allopurinol (ZYLOPRIM) 300 MG tablet TAKE 1 TABLET DAILY (Patient taking differently: Take 300 mg by mouth in the morning.) 90 tablet 3   amLODipine (NORVASC) 2.5 MG tablet Take 2.5 mg by  mouth at bedtime.     aspirin EC 81 MG tablet Take 1 tablet (81 mg total) by mouth daily. Swallow whole. 30 tablet 12   atorvastatin (LIPITOR) 20 MG tablet Take 1 tablet (20 mg total) by mouth at bedtime. 30 tablet 3   B-D UF III MINI PEN NEEDLES 31G X 5 MM MISC      Blood Glucose Monitoring Suppl (ACCU-CHEK GUIDE) w/Device KIT      carvedilol (COREG) 3.125 MG tablet Take 1 tablet (3.125 mg total) by mouth 2 (two) times daily. 60 tablet 3   dapagliflozin propanediol (FARXIGA) 10 MG TABS tablet Take 1 tablet (10 mg total) by mouth daily. 30 tablet 3   fexofenadine (ALLEGRA) 180 MG tablet Take 180 mg by mouth at bedtime.     hydrochlorothiazide (HYDRODIURIL) 25 MG tablet Take 25 mg by mouth daily.     losartan (COZAAR) 50 MG tablet Take 50 mg by mouth 2 (two) times daily.     metFORMIN (GLUCOPHAGE-XR) 500 MG 24 hr tablet Take 1 tablet (500 mg total) by mouth 2 (two) times daily with a meal. Take 500 mg by mouth in the morning and 1,000 mg at bedtime (Patient taking differently: Take 500-1,000 mg by mouth See admin instructions. Take 500 mg by mouth in the morning and 1,000 mg at bedtime)     ondansetron (ZOFRAN) 8 MG tablet Take 1 tablet (8 mg total) by mouth every 8 (eight) hours as needed. 30 tablet 0   ONETOUCH DELICA LANCETS FINE MISC 3 (three) times daily.   5   prochlorperazine (COMPAZINE) 10 MG tablet Take 1 tablet (10 mg total) by mouth every 6 (six) hours as needed for nausea or vomiting. 30 tablet 0   TOUJEO SOLOSTAR 300 UNIT/ML SOPN Inject 0-8 Units into the skin See admin instructions. Inject 0-8 units into the skin in the morning and at bedtime, PER SLIDING SCALE: BGL: 0-99 = give nothing; 100-150 = 4 units; 151-200 = 6 units; 201-250 = 8 units     venetoclax (VENCLEXTA) 10 & 50 & 100 MG Starter Pack Take by mouth daily. Take 20 mg for 7 days, then 50 mg daily x 7d, then 100 mg daily x 7d, then 200 mg daily x 7d. Take with food & water. 42 tablet 0   zanubrutinib (BRUKINSA) 80 MG capsule  Take 2 capsules (160 mg total) by mouth 2 (two) times daily. (Patient not taking: Reported on 01/22/2024) 120 capsule 2   No current facility-administered medications for this visit.    REVIEW OF SYSTEMS:   Constitutional: ( - )  fevers, ( - )  chills , ( - ) night sweats Eyes: ( - ) blurriness of vision, ( - ) double vision, ( - ) watery eyes Ears, nose, mouth, throat, and face: ( - ) mucositis, ( - ) sore throat Respiratory: ( - ) cough, ( - ) dyspnea, ( - ) wheezes Cardiovascular: ( - ) palpitation, ( - ) chest discomfort, ( - ) lower extremity swelling Gastrointestinal:  ( - ) nausea, ( - ) heartburn, ( - ) change in bowel habits Skin: ( - ) abnormal skin rashes Lymphatics: ( - ) new lymphadenopathy, ( - ) easy bruising Neurological: ( - ) numbness, ( - ) tingling, ( - ) new weaknesses Behavioral/Psych: ( - ) mood change, ( - ) new changes  All other systems were reviewed with the patient and are negative.  PHYSICAL EXAMINATION: ECOG PERFORMANCE STATUS: 1 - Symptomatic but completely ambulatory  Vitals:   01/22/24 1051  BP: (!) 169/71  Pulse: 73  Resp: 16  Temp: 97.9 F (36.6 C)  SpO2: 100%      Filed Weights   01/22/24 1051  Weight: 194 lb (88 kg)     GENERAL: Well-appearing elderly male, alert, no distress and comfortable SKIN: skin color, texture, turgor are normal, no rashes or significant lesions EYES: conjunctiva are pink and non-injected, sclera clear LYMPH:  no palpable lymphadenopathy in the cervical or supraclavicular regions.  LUNGS: clear to auscultation and percussion with normal breathing effort HEART: regular rate & rhythm and no murmurs and no lower extremity edema Musculoskeletal: no cyanosis of digits and no clubbing  PSYCH: alert & oriented x 3, fluent speech NEURO: no focal motor/sensory deficits  LABORATORY DATA:  I have reviewed the data as listed    Latest Ref Rng & Units 01/22/2024   10:05 AM 01/08/2024   10:18 AM 12/30/2023    8:33 AM   CBC  WBC 4.0 - 10.5 K/uL 3.9  8.2  5.5   Hemoglobin 13.0 - 17.0 g/dL 91.4  78.2  95.6   Hematocrit 39.0 - 52.0 % 38.9  36.0  36.2   Platelets 150 - 400 K/uL 77  86  83        Latest Ref Rng & Units 01/22/2024   10:05 AM 01/08/2024   10:18 AM 12/30/2023    8:33 AM  CMP  Glucose 70 - 99 mg/dL 213  086  578   BUN 8 - 23 mg/dL 26  31  31    Creatinine 0.61 - 1.24 mg/dL 4.69  6.29  5.28   Sodium 135 - 145 mmol/L 141  139  139   Potassium 3.5 - 5.1 mmol/L 4.0  4.2  4.4   Chloride 98 - 111 mmol/L 107  104  105   CO2 22 - 32 mmol/L 30  30  30    Calcium 8.9 - 10.3 mg/dL 8.5  9.3  9.5   Total Protein 6.5 - 8.1 g/dL 6.9  7.0  6.9   Total Bilirubin 0.0 - 1.2 mg/dL 1.1  1.0  0.8   Alkaline Phos 38 - 126 U/L 70  61  57   AST 15 - 41 U/L 22  17  18    ALT 0 - 44 U/L 20  20  21      RADIOGRAPHIC STUDIES: No results found.   ASSESSMENT & PLAN Cody [name] is a 68 y.o. male with medical history significant for CLL who presents for a follow up visit.   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 Discontinued due to poor tolerance with medication.  --Switched to Zanubrutinib 160 mg PO BID starting July 2024 but recently stopped this medication as he is having progression of disease. --Started cycle 1 day 1 of Obinutuzumab (Gazyva) on 12/24/2023. Had an infusion reaction that resolved with pepcid, decadron and benadryl.  Resumed remaining infusion with any issues. Added singulair 10 mg PO to premeds. PLAN: --Due for cycle 2, day 1 of Gazyva today --Labs from today reviewed. WBC 3.9, Hgb 12.0, MCV 85.1, Plt 77K, Ab lymph count 2.4. Reviewed with Dr. Leonides Schanz who recommends to continue with [facility] without any dose modifications. Since patient is well on obinutuzumab alone, could consider that therapy alone --RTC in 4 weeks before Cycle 3, Day 1.   #Hypertension -Blood  pressure readings have been slightly elevated. Patient reports frequent urination, possibly related to antihypertensive medication (Hydrochlorothiazide). -Patient plans to follow up with PCP next week to discuss blood pressure readings for possible dose adjustment.  #General Health Maintenance -Encourage continued physical activity (treadmill walking).     No orders of the defined types were placed in this encounter.   All questions were answered. The patient knows to call the clinic with any problems, questions or concerns.  I have spent a total of 30 minutes minutes of face-to-face and non-face-to-face time, preparing to see the patient, performing a medically appropriate examination, counseling and educating the patient, documenting clinical information in the electronic health record,and care coordination.   Georga Kaufmann PA-C Dept of Hematology and Oncology [facility] Phone: [phone]   01/22/2024 11:11 AM  Mathis Fare BD, John Giovanni, Caligaris[place]Cappio F, Dighiero G, Dhner H, [place], [place], [place] E, Chiorazzi N, [place], Rai KR, [place], Eich[place] B, O'Brien S, Robak T, Seymour JF, [place] WG. iwCLL guidelines for diagnosis, indications for treatment, response assessment, and supportive management of CLL. Blood. 2018 Jun 21;131(25):2745-2760.  Active disease should be clearly documented to initiate therapy. At least 1 of the following criteria should be met.  1) Evidence of progressive marrow failure as manifested by the development of, or worsening of, anemia and/or thrombocytopenia. Cutoff levels of Hb <10 g/dL or platelet counts <956  109/L are generally regarded as indication for treatment. However, in some patients, platelet counts <100  109/L may remain stable over a long period; this situation does not automatically require therapeutic intervention. 2) Massive (ie, >=6 cm below the left costal margin) or  progressive or symptomatic splenomegaly. 3) Massive nodes (ie, >=10 cm in longest diameter) or progressive or symptomatic lymphadenopathy. 4) Progressive lymphocytosis with an increase of >=50% over a 55-month period, or lymphocyte doubling time (LDT) <6 months. LDT can be obtained by linear regression extrapolation of absolute lymphocyte counts obtained at intervals of 2 weeks over an observation period of 2 to 3 months; patients with initial blood lymphocyte counts <30  109/L may require a longer observation period to determine the LDT. Factors contributing to lymphocytosis other than CLL (eg, infections, steroid administration) should be excluded. 5) Autoimmune complications including anemia or thrombocytopenia poorly responsive to corticosteroids. 6) Symptomatic or functional extranodal involvement (eg, skin, kidney, lung, spine). Disease-related symptoms as defined by any of the following: Unintentional weight loss >=10% within the previous 6 months. Significant fatigue (ie, ECOG performance scale 2 or worse; cannot work or unable to perform usual activities). Fevers >=100.62F  or 38.0C for 2 or more weeks without evidence of infection. Night sweats for >=1 month without evidence of infection.

## 2024-01-22 NOTE — Patient Instructions (Signed)
 [facility] - A DEPT OF [facility]  Discharge Instructions: Thank you for choosing [facility] to provide your oncology and hematology care.   If you have a lab appointment with the Cancer Center, please go directly to the Cancer Center and check in at the registration area.   Wear comfortable clothing and clothing appropriate for easy access to any Portacath or PICC line.   We strive to give you quality time with your provider. You may need to reschedule your appointment if you arrive late (15 or more minutes).  Arriving late affects you and other patients whose appointments are after yours.  Also, if you miss three or more appointments without notifying the office, you may be dismissed from the clinic at the provider's discretion.      For prescription refill requests, have your pharmacy contact our office and allow 72 hours for refills to be completed.    Today you received the following chemotherapy and/or immunotherapy agents: obinutuzumab      To help prevent nausea and vomiting after your treatment, we encourage you to take your nausea medication as directed.  BELOW ARE SYMPTOMS THAT SHOULD BE REPORTED IMMEDIATELY: *FEVER GREATER THAN 100.4 F (38 C) OR HIGHER *CHILLS OR SWEATING *NAUSEA AND VOMITING THAT IS NOT CONTROLLED WITH YOUR NAUSEA MEDICATION *UNUSUAL SHORTNESS OF BREATH *UNUSUAL BRUISING OR BLEEDING *URINARY PROBLEMS (pain or burning when urinating, or frequent urination) *BOWEL PROBLEMS (unusual diarrhea, constipation, pain near the anus) TENDERNESS IN MOUTH AND THROAT WITH OR WITHOUT PRESENCE OF ULCERS (sore throat, sores in mouth, or a toothache) UNUSUAL RASH, SWELLING OR PAIN  UNUSUAL VAGINAL DISCHARGE OR ITCHING   Items with * indicate a potential emergency and should be followed up as soon as possible or go to the Emergency Department if any problems should occur.  Please show the CHEMOTHERAPY ALERT CARD or  IMMUNOTHERAPY ALERT CARD at check-in to the Emergency Department and triage nurse.  Should you have questions after your visit or need to cancel or reschedule your appointment, please contact [facility] - A DEPT OF [facility]  Dept: [phone]  and follow the prompts.  Office hours are 8:00 a.m. to 4:30 p.m. Monday - Friday. Please note that voicemails left after 4:00 p.m. may not be returned until the following business day.  We are closed weekends and major holidays. You have access to a nurse at all times for urgent questions. Please call the main number to the clinic Dept: [phone] and follow the prompts.   For any non-urgent questions, you may also contact your provider using MyChart. We now offer e-Visits for anyone 56 and older to request care online for non-urgent symptoms. For details visit [email or web address].   Also download the MyChart app! Go to the app store, search "MyChart", open the app, select [place], and log in with your MyChart username and password.

## 2024-01-23 ENCOUNTER — Other Ambulatory Visit: Payer: Self-pay

## 2024-01-25 ENCOUNTER — Encounter: Payer: Self-pay | Admitting: Hematology and Oncology

## 2024-01-26 ENCOUNTER — Other Ambulatory Visit (HOSPITAL_COMMUNITY): Payer: Self-pay

## 2024-01-26 NOTE — Progress Notes (Signed)
 Therapy was changed due to disease progression, disenrolling from [facility].

## 2024-01-27 ENCOUNTER — Encounter: Payer: Self-pay | Admitting: Hematology and Oncology

## 2024-01-27 NOTE — Progress Notes (Signed)
 Pt's sister, Ms. Matlock called about applying for more copay assistance since pt will be starting chemo again.  I gave her [employer] contact info to reach out for possible assistance.  She was very Adult nurse.

## 2024-02-02 ENCOUNTER — Other Ambulatory Visit (HOSPITAL_COMMUNITY): Payer: Self-pay

## 2024-02-08 NOTE — Telephone Encounter (Signed)
 Oral Oncology Pharmacist Encounter  Discussed with Dr. Leonides Schanz about the venetoclax and patient has responded really well to the obinutuzumab. Patient does not need to have the venetoclax added at this time. Prescription will be discontinued and if MD were to add it in the future a new prescription will be added.   Oral chemotherapy clinic will sign off.   Bethel Born, PharmD Hematology/Oncology Clinical Pharmacist [facility] [phone]

## 2024-02-09 ENCOUNTER — Ambulatory Visit (INDEPENDENT_AMBULATORY_CARE_PROVIDER_SITE_OTHER): Payer: Medicare PPO

## 2024-02-09 DIAGNOSIS — E1151 Type 2 diabetes mellitus with diabetic peripheral angiopathy without gangrene: Secondary | ICD-10-CM | POA: Diagnosis not present

## 2024-02-09 DIAGNOSIS — L84 Corns and callosities: Secondary | ICD-10-CM

## 2024-02-09 DIAGNOSIS — M2142 Flat foot [pes planus] (acquired), left foot: Secondary | ICD-10-CM | POA: Diagnosis not present

## 2024-02-09 DIAGNOSIS — M2141 Flat foot [pes planus] (acquired), right foot: Secondary | ICD-10-CM

## 2024-02-09 DIAGNOSIS — E1169 Type 2 diabetes mellitus with other specified complication: Secondary | ICD-10-CM | POA: Diagnosis not present

## 2024-02-09 NOTE — Progress Notes (Signed)

## 2024-02-17 ENCOUNTER — Ambulatory Visit: Payer: Medicare PPO | Admitting: Podiatry

## 2024-02-17 MED FILL — Dexamethasone Sodium Phosphate Inj 100 MG/10ML: INTRAMUSCULAR | Qty: 2 | Status: AC

## 2024-02-18 ENCOUNTER — Inpatient Hospital Stay (HOSPITAL_BASED_OUTPATIENT_CLINIC_OR_DEPARTMENT_OTHER): Payer: Medicare PPO | Admitting: Hematology and Oncology

## 2024-02-18 ENCOUNTER — Inpatient Hospital Stay: Payer: Medicare PPO | Attending: Hematology and Oncology

## 2024-02-18 ENCOUNTER — Other Ambulatory Visit: Payer: Medicare PPO

## 2024-02-18 VITALS — BP 125/62 | HR 70 | Temp 97.7°F | Resp 16

## 2024-02-18 VITALS — BP 115/74 | HR 69 | Temp 98.2°F | Resp 13 | Wt 192.2 lb

## 2024-02-18 DIAGNOSIS — C9110 Chronic lymphocytic leukemia of B-cell type not having achieved remission: Secondary | ICD-10-CM

## 2024-02-18 DIAGNOSIS — D696 Thrombocytopenia, unspecified: Secondary | ICD-10-CM

## 2024-02-18 DIAGNOSIS — Z5112 Encounter for antineoplastic immunotherapy: Secondary | ICD-10-CM | POA: Insufficient documentation

## 2024-02-18 DIAGNOSIS — Z79899 Other long term (current) drug therapy: Secondary | ICD-10-CM | POA: Insufficient documentation

## 2024-02-18 LAB — CBC WITH DIFFERENTIAL (CANCER CENTER ONLY)
Abs Immature Granulocytes: 0.01 10*3/uL (ref 0.00–0.07)
Basophils Absolute: 0 10*3/uL (ref 0.0–0.1)
Basophils Relative: 1 %
Eosinophils Absolute: 0.1 10*3/uL (ref 0.0–0.5)
Eosinophils Relative: 2 %
HCT: 41.8 % (ref 39.0–52.0)
Hemoglobin: 13 g/dL (ref 13.0–17.0)
Immature Granulocytes: 0 %
Lymphocytes Relative: 52 %
Lymphs Abs: 2.3 10*3/uL (ref 0.7–4.0)
MCH: 26.7 pg (ref 26.0–34.0)
MCHC: 31.1 g/dL (ref 30.0–36.0)
MCV: 86 fL (ref 80.0–100.0)
Monocytes Absolute: 0.1 10*3/uL (ref 0.1–1.0)
Monocytes Relative: 3 %
Neutro Abs: 1.8 10*3/uL (ref 1.7–7.7)
Neutrophils Relative %: 42 %
Platelet Count: 101 10*3/uL — ABNORMAL LOW (ref 150–400)
RBC: 4.86 MIL/uL (ref 4.22–5.81)
RDW: 14.7 % (ref 11.5–15.5)
WBC Count: 4.4 10*3/uL (ref 4.0–10.5)
nRBC: 0 % (ref 0.0–0.2)

## 2024-02-18 LAB — CMP (CANCER CENTER ONLY)
ALT: 18 U/L (ref 0–44)
AST: 23 U/L (ref 15–41)
Albumin: 4.5 g/dL (ref 3.5–5.0)
Alkaline Phosphatase: 90 U/L (ref 38–126)
Anion gap: 4 — ABNORMAL LOW (ref 5–15)
BUN: 36 mg/dL — ABNORMAL HIGH (ref 8–23)
CO2: 32 mmol/L (ref 22–32)
Calcium: 9.3 mg/dL (ref 8.9–10.3)
Chloride: 107 mmol/L (ref 98–111)
Creatinine: 1.38 mg/dL — ABNORMAL HIGH (ref 0.61–1.24)
GFR, Estimated: 55 mL/min — ABNORMAL LOW (ref >60)
Glucose, Bld: 102 mg/dL — ABNORMAL HIGH (ref 70–99)
Potassium: 4.3 mmol/L (ref 3.5–5.1)
Sodium: 143 mmol/L (ref 135–145)
Total Bilirubin: 0.9 mg/dL (ref 0.0–1.2)
Total Protein: 7.4 g/dL (ref 6.5–8.1)

## 2024-02-18 MED ORDER — DIPHENHYDRAMINE HCL 50 MG/ML IJ SOLN
50.0000 mg | Freq: Once | INTRAMUSCULAR | Status: AC
  Administered 2024-02-18: 50 mg via INTRAVENOUS
  Filled 2024-02-18: qty 1

## 2024-02-18 MED ORDER — SODIUM CHLORIDE 0.9 % IV SOLN
1000.0000 mg | Freq: Once | INTRAVENOUS | Status: AC
  Administered 2024-02-18: 1000 mg via INTRAVENOUS
  Filled 2024-02-18: qty 40

## 2024-02-18 MED ORDER — ACETAMINOPHEN 325 MG PO TABS
650.0000 mg | ORAL_TABLET | Freq: Once | ORAL | Status: AC
  Administered 2024-02-18: 650 mg via ORAL
  Filled 2024-02-18: qty 2

## 2024-02-18 MED ORDER — SODIUM CHLORIDE 0.9 % IV SOLN
20.0000 mg | Freq: Once | INTRAVENOUS | Status: AC
  Administered 2024-02-18: 20 mg via INTRAVENOUS
  Filled 2024-02-18: qty 20

## 2024-02-18 MED ORDER — SODIUM CHLORIDE 0.9 % IV SOLN
INTRAVENOUS | Status: DC
Start: 2024-02-18 — End: 2024-02-18

## 2024-02-18 MED ORDER — FAMOTIDINE IN NACL 20-0.9 MG/50ML-% IV SOLN
20.0000 mg | Freq: Once | INTRAVENOUS | Status: AC
  Administered 2024-02-18: 20 mg via INTRAVENOUS
  Filled 2024-02-18: qty 50

## 2024-02-18 NOTE — Progress Notes (Signed)
 [facility] [phone]   [phone]  PROGRESS NOTE  Patient Care Team: Mirna Mires, MD as PCP - General (Family Medicine) Jaci Standard, MD as Consulting Physician (Hematology and Oncology)  Hematological/Oncological History # CLL --diagnosis of chronic lymphoid leukemia made 11/04/2018             (a) the cells are positive for CD 04/12/19/22/23, kappa restricted   (1) Rituximab weekly x 9 weeks beginning 11/23/2018             (a) reaction to first dose (which was 100 mg only).   (2) on maintenance rituximab, first dose 03/07/2019, repeated every 3 months             (a) baseline PET scan obtained on 02/16/2020 was Deauville 2 except for the left iliac nodes, with SUV max 3.1             (b) rituximab decreased to every 6 months beginning October 2021   (3) Switched to Zanubrutinib 160 mg PO BID in July 2024.   Interval History:  Mr. Nipp is a 68 year old male with Chronic Lymphocytic Leukemia (CLL) who presents for a follow-up visit while on zanubrutinib. The last visit was on 12/08/2023. In the interim since the last visit he is continued on his Obinutuzumab therapy.   Mr. Legler reports he is tolerating his obinutuzumab therapy well with no major side effects.  He reports that he is not having any rigors, chills, sweats, nausea, vomiting or diarrhea.  He reports his appetite remains good and his energy is strong.  Overall he is tolerating without any major difficulties.  He notes no change in the color of his urine and his bowel habits are regular.  He notes that he does have some occasional problems with starting the IV line but they always managed to get his dosage and.. Overall, he is at his baseline level of health.  He denies fevers, chills, sweats, shortness of breath or cough. He has no other complaints. A full 10 point ROS is listed below.  Today we discussed options moving forward including continuation of obinutuzumab alone  versus the addition of venetoclax.  I do worry about the toxicity of venetoclax and the patient's cognitive function, concerned that he may not be able to express concerns or side effects from the medication.  Additionally we are seeing an excellent response to the obinutuzumab alone, therefore I think you would be appropriate to continue with just the IV infusion.  After discussing the risks and benefits of this approach the family was in agreement.  MEDICAL HISTORY:  Past Medical History:  Diagnosis Date   Alcoholic cirrhosis (HCC)    Cataract    Chronic lymphoid leukemia (HCC)    Diabetes mellitus without complication (HCC)    High blood pressure 10/24/2004   History of alcohol abuse    Hypercholesterolemia     SURGICAL HISTORY: No past surgical history on file.  SOCIAL HISTORY: Social History   Socioeconomic History   Marital status: Divorced    Spouse name: Not on file   Number of children: Not on file   Years of education: Not on file   Highest education level: Not on file  Occupational History   Not on file  Tobacco Use   Smoking status: Never   Smokeless tobacco: Never  Substance and Sexual Activity   Alcohol use: Not Currently   Drug use: No   Sexual activity: Not on file  Other Topics Concern   Not on file  Social History Narrative   Not on file   Social Drivers of Health   Financial Resource Strain: Not on file  Food Insecurity: No Food Insecurity (10/01/2023)   Hunger Vital Sign    Worried About Running Out of Food in the Last Year: Never true    Ran Out of Food in the Last Year: Never true  Transportation Needs: No Transportation Needs (10/01/2023)   PRAPARE - Administrator, Civil Service (Medical): No    Lack of Transportation (Non-Medical): No  Physical Activity: Not on file  Stress: Not on file  Social Connections: Not on file  Intimate Partner Violence: Not At Risk (10/01/2023)   Humiliation, Afraid, Rape, and Kick questionnaire     Fear of Current or Ex-Partner: No    Emotionally Abused: No    Physically Abused: No    Sexually Abused: No    FAMILY HISTORY: Family History  Problem Relation Age of Onset   Alcohol abuse Father    Cirrhosis Father    Coronary artery disease Father     ALLERGIES:  is allergic to gazyva [obinutuzumab] and rituxan [rituximab].  MEDICATIONS:  Current Outpatient Medications  Medication Sig Dispense Refill   ACCU-CHEK GUIDE TEST test strip USE TO CHECK BLOOD SUGAR UP TO FOUR TIMES PER DAY.     allopurinol (ZYLOPRIM) 300 MG tablet TAKE 1 TABLET DAILY (Patient taking differently: Take 300 mg by mouth in the morning.) 90 tablet 3   amLODipine (NORVASC) 2.5 MG tablet Take 2.5 mg by mouth at bedtime.     aspirin EC 81 MG tablet Take 1 tablet (81 mg total) by mouth daily. Swallow whole. 30 tablet 12   atorvastatin (LIPITOR) 20 MG tablet Take 1 tablet (20 mg total) by mouth at bedtime. 30 tablet 3   B-D UF III MINI PEN NEEDLES 31G X 5 MM MISC      Blood Glucose Monitoring Suppl (ACCU-CHEK GUIDE) w/Device KIT      carvedilol (COREG) 3.125 MG tablet Take 1 tablet (3.125 mg total) by mouth 2 (two) times daily. 60 tablet 3   dapagliflozin propanediol (FARXIGA) 10 MG TABS tablet Take 1 tablet (10 mg total) by mouth daily. 30 tablet 3   fexofenadine (ALLEGRA) 180 MG tablet Take 180 mg by mouth at bedtime.     hydrochlorothiazide (HYDRODIURIL) 25 MG tablet Take 25 mg by mouth daily.     losartan (COZAAR) 50 MG tablet Take 50 mg by mouth 2 (two) times daily.     metFORMIN (GLUCOPHAGE-XR) 500 MG 24 hr tablet Take 1 tablet (500 mg total) by mouth 2 (two) times daily with a meal. Take 500 mg by mouth in the morning and 1,000 mg at bedtime (Patient taking differently: Take 500-1,000 mg by mouth See admin instructions. Take 500 mg by mouth in the morning and 1,000 mg at bedtime)     ondansetron (ZOFRAN) 8 MG tablet Take 1 tablet (8 mg total) by mouth every 8 (eight) hours as needed. 30 tablet 0   ONETOUCH  DELICA LANCETS FINE MISC 3 (three) times daily.   5   prochlorperazine (COMPAZINE) 10 MG tablet Take 1 tablet (10 mg total) by mouth every 6 (six) hours as needed for nausea or vomiting. 30 tablet 0   TOUJEO SOLOSTAR 300 UNIT/ML SOPN Inject 0-8 Units into the skin See admin instructions. Inject 0-8 units into the skin in the morning and at bedtime, PER SLIDING SCALE: BGL:  0-99 = give nothing; 100-150 = 4 units; 151-200 = 6 units; 201-250 = 8 units     No current facility-administered medications for this visit.   Facility-Administered Medications Ordered in Other Visits  Medication Dose Route Frequency Provider Last Rate Last Admin   0.9 %  sodium chloride infusion   Intravenous Continuous Jaci Standard, MD 10 mL/hr at 02/18/24 1112 New Bag at 02/18/24 1112    REVIEW OF SYSTEMS:   Constitutional: ( - ) fevers, ( - )  chills , ( - ) night sweats Eyes: ( - ) blurriness of vision, ( - ) double vision, ( - ) watery eyes Ears, nose, mouth, throat, and face: ( - ) mucositis, ( - ) sore throat Respiratory: ( - ) cough, ( - ) dyspnea, ( - ) wheezes Cardiovascular: ( - ) palpitation, ( - ) chest discomfort, ( - ) lower extremity swelling Gastrointestinal:  ( - ) nausea, ( - ) heartburn, ( - ) change in bowel habits Skin: ( - ) abnormal skin rashes Lymphatics: ( - ) new lymphadenopathy, ( - ) easy bruising Neurological: ( - ) numbness, ( - ) tingling, ( - ) new weaknesses Behavioral/Psych: ( - ) mood change, ( - ) new changes  All other systems were reviewed with the patient and are negative.  PHYSICAL EXAMINATION: ECOG PERFORMANCE STATUS: 1 - Symptomatic but completely ambulatory  Vitals:   02/18/24 1035  BP: 115/74  Pulse: 69  Resp: 13  Temp: 98.2 F (36.8 C)  SpO2: 100%       Filed Weights   02/18/24 1035  Weight: 192 lb 3.2 oz (87.2 kg)         GENERAL: Well-appearing elderly male, alert, no distress and comfortable SKIN: skin color, texture, turgor are normal, no  rashes or significant lesions EYES: conjunctiva are pink and non-injected, sclera clear LYMPH:  no palpable lymphadenopathy in the cervical or supraclavicular regions.  LUNGS: clear to auscultation and percussion with normal breathing effort HEART: regular rate & rhythm and no murmurs and no lower extremity edema Musculoskeletal: no cyanosis of digits and no clubbing  PSYCH: alert & oriented x 3, fluent speech NEURO: no focal motor/sensory deficits  LABORATORY DATA:  I have reviewed the data as listed    Latest Ref Rng & Units 02/18/2024    9:45 AM 01/22/2024   10:05 AM 01/08/2024   10:18 AM  CBC  WBC 4.0 - 10.5 K/uL 4.4  3.9  8.2   Hemoglobin 13.0 - 17.0 g/dL 65.7  84.6  96.2   Hematocrit 39.0 - 52.0 % 41.8  38.9  36.0   Platelets 150 - 400 K/uL 101  77  86        Latest Ref Rng & Units 02/18/2024    9:45 AM 01/22/2024   10:05 AM 01/08/2024   10:18 AM  CMP  Glucose 70 - 99 mg/dL 952  841  324   BUN 8 - 23 mg/dL 36  26  31   Creatinine 0.61 - 1.24 mg/dL 4.01  0.27  2.53   Sodium 135 - 145 mmol/L 143  141  139   Potassium 3.5 - 5.1 mmol/L 4.3  4.0  4.2   Chloride 98 - 111 mmol/L 107  107  104   CO2 22 - 32 mmol/L 32  30  30   Calcium 8.9 - 10.3 mg/dL 9.3  8.5  9.3   Total Protein 6.5 - 8.1 g/dL 7.4  6.9  7.0   Total Bilirubin 0.0 - 1.2 mg/dL 0.9  1.1  1.0   Alkaline Phos 38 - 126 U/L 90  70  61   AST 15 - 41 U/L 23  22  17    ALT 0 - 44 U/L 18  20  20      RADIOGRAPHIC STUDIES: No results found.   ASSESSMENT & PLAN RESHAUN BRISENO is a 68 y.o. male with medical history significant for CLL who presents for a follow up visit.   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 Discontinued due to poor tolerance with medication.  --Labs show white blood cell 4.4, hemoglobin 13.0, MCV 86, platelets 101. Creatinine and LFTs normal.  --Currently on  Zanubrutinib 160 mg PO BID. will stop this medication as he is having progression of disease. --Plan to proceed with obinutuzumab monotherapy  The patient is doing particular well on obinutuzumab therapy alone, will HOLD on starting venetoclax. Discussed risks/benefits with patient and family.  --Return to clinic for Cycle 3 Day 1 of treatment. (4 weeks)   #Hypertension -Blood pressure readings have been slightly elevated. Patient reports frequent urination, possibly related to antihypertensive medication (Hydrochlorothiazide). -Advise patient to discuss blood pressure readings with PCP for possible dose adjustment.  #General Health Maintenance -Encourage continued physical activity (treadmill walking).      No orders of the defined types were placed in this encounter.   All questions were answered. The patient knows to call the clinic with any problems, questions or concerns.  I have spent a total of 30 minutes minutes of face-to-face and non-face-to-face time, preparing to see the patient, performing a medically appropriate examination, counseling and educating the patient, documenting clinical information in the electronic health record,and care coordination.   Ulysees Barns, MD Department of Hematology/Oncology [facility] Phone: [phone] Pager: [phone] Email: [email or web address]   02/18/2024 2:29 PM  Mathis Fare BD, Catovsky D, Caligaris-Cappio F, Dighiero G, Dhner H, [place], Fowler, [place] E, Chiorazzi N, [place], Rai KR, [place], Eichhorst B, O'Brien S, Robak T, Seymour JF, Kipps TJ. iwCLL guidelines for diagnosis, indications for treatment, response assessment, and supportive management of CLL. Blood. 2018 Jun 21;131(25):2745-2760.  Active disease should be clearly documented to initiate therapy. At least 1 of the following criteria should be met.  1) Evidence of progressive marrow failure as  manifested by the development of, or worsening of, anemia and/or thrombocytopenia. Cutoff levels of Hb <10 g/dL or platelet counts <295  109/L are generally regarded as indication for treatment. However, in some patients, platelet counts <100  109/L may remain stable over a long period; this situation does not automatically require therapeutic intervention. 2) Massive (ie, >=6 cm below the left costal margin) or progressive or symptomatic splenomegaly. 3) Massive nodes (ie, >=10 cm in longest diameter) or progressive or symptomatic lymphadenopathy. 4) Progressive lymphocytosis with an increase of >=50% over a 22-month period, or lymphocyte doubling time (LDT) <6 months. LDT can be obtained by linear regression extrapolation of absolute lymphocyte counts obtained at intervals of 2 weeks over an observation period of 2 to 3 months; patients with initial blood lymphocyte counts <30  109/L may require a longer observation period to determine the LDT. Factors contributing to lymphocytosis other than CLL (eg, infections, steroid administration) should be excluded. 5) Autoimmune complications including anemia or thrombocytopenia poorly responsive to corticosteroids. 6) Symptomatic  or functional extranodal involvement (eg, skin, kidney, lung, spine). Disease-related symptoms as defined by any of the following: Unintentional weight loss >=10% within the previous 6 months. Significant fatigue (ie, ECOG performance scale 2 or worse; cannot work or unable to perform usual activities). Fevers >=100.49F or 38.0C for 2 or more weeks without evidence of infection. Night sweats for >=1 month without evidence of infection.

## 2024-02-18 NOTE — Patient Instructions (Signed)
 [facility] - A DEPT OF [facility]  Discharge Instructions: Thank you for choosing [facility] to provide your oncology and hematology care.   If you have a lab appointment with the Cancer Center, please go directly to the Cancer Center and check in at the registration area.   Wear comfortable clothing and clothing appropriate for easy access to any Portacath or PICC line.   We strive to give you quality time with your provider. You may need to reschedule your appointment if you arrive late (15 or more minutes).  Arriving late affects you and other patients whose appointments are after yours.  Also, if you miss three or more appointments without notifying the office, you may be dismissed from the clinic at the provider's discretion.      For prescription refill requests, have your pharmacy contact our office and allow 72 hours for refills to be completed.    Today you received the following chemotherapy and/or immunotherapy agents Gazyva   To help prevent nausea and vomiting after your treatment, we encourage you to take your nausea medication as directed.  BELOW ARE SYMPTOMS THAT SHOULD BE REPORTED IMMEDIATELY: *FEVER GREATER THAN 100.4 F (38 C) OR HIGHER *CHILLS OR SWEATING *NAUSEA AND VOMITING THAT IS NOT CONTROLLED WITH YOUR NAUSEA MEDICATION *UNUSUAL SHORTNESS OF BREATH *UNUSUAL BRUISING OR BLEEDING *URINARY PROBLEMS (pain or burning when urinating, or frequent urination) *BOWEL PROBLEMS (unusual diarrhea, constipation, pain near the anus) TENDERNESS IN MOUTH AND THROAT WITH OR WITHOUT PRESENCE OF ULCERS (sore throat, sores in mouth, or a toothache) UNUSUAL RASH, SWELLING OR PAIN  UNUSUAL VAGINAL DISCHARGE OR ITCHING   Items with * indicate a potential emergency and should be followed up as soon as possible or go to the Emergency Department if any problems should occur.  Please show the CHEMOTHERAPY ALERT CARD or IMMUNOTHERAPY ALERT  CARD at check-in to the Emergency Department and triage nurse.  Should you have questions after your visit or need to cancel or reschedule your appointment, please contact [facility] - [facility] DE[facility]  Dept: [phone]  and follow the prompts.  Office hours are 8:00 a.m. to 4:30 p.m. Monday - Friday. Please note that voicemails left after 4:00 p.m. may not be returned until the following business day.  We are closed weekends and major holidays. You have access to a nurse at all times for urgent questions. Please call the main number to the clinic Dept: [phone] and follow the prompts.   For any non-urgent questions, you may also contact your provider using MyChart. We now offer e-Visits for anyone 76 and older to request care online for non-urgent symptoms. For details visit [email or web address].   Also download the MyChart app! Go to the app store, search "MyChart", open the app, select [place], and log in with your MyChart username and password.

## 2024-02-22 DIAGNOSIS — R2689 Other abnormalities of gait and mobility: Secondary | ICD-10-CM | POA: Diagnosis not present

## 2024-02-22 DIAGNOSIS — I10 Essential (primary) hypertension: Secondary | ICD-10-CM | POA: Diagnosis not present

## 2024-02-22 DIAGNOSIS — C9590 Leukemia, unspecified not having achieved remission: Secondary | ICD-10-CM | POA: Diagnosis not present

## 2024-02-22 DIAGNOSIS — E1165 Type 2 diabetes mellitus with hyperglycemia: Secondary | ICD-10-CM | POA: Diagnosis not present

## 2024-02-24 ENCOUNTER — Ambulatory Visit: Admitting: Podiatry

## 2024-02-24 ENCOUNTER — Encounter: Payer: Self-pay | Admitting: Podiatry

## 2024-02-24 DIAGNOSIS — B351 Tinea unguium: Secondary | ICD-10-CM

## 2024-02-24 DIAGNOSIS — E1151 Type 2 diabetes mellitus with diabetic peripheral angiopathy without gangrene: Secondary | ICD-10-CM | POA: Diagnosis not present

## 2024-02-24 DIAGNOSIS — L84 Corns and callosities: Secondary | ICD-10-CM | POA: Diagnosis not present

## 2024-02-24 DIAGNOSIS — M79675 Pain in left toe(s): Secondary | ICD-10-CM

## 2024-02-24 DIAGNOSIS — M79674 Pain in right toe(s): Secondary | ICD-10-CM | POA: Diagnosis not present

## 2024-02-24 NOTE — Progress Notes (Signed)
 This patient presents to the office with chief complaint of long thick painful nails.  Patient says the nails are painful walking and wearing shoes.  This patient is unable to self treat.  This patient is unable to trim his nails since he is unable to reach his  nails.   he presents to the office for preventative foot care services.    General Appearance  Alert, conversant and in no acute stress.  Vascular  Dorsalis pedis and posterior tibial  pulses are palpable  bilaterally.  Capillary return is within normal limits  bilaterally. Temperature is within normal limits  bilaterally.  Neurologic  Senn-Weinstein monofilament wire test within normal limits  bilaterally. Muscle power within normal limits bilaterally.  Nails Thick disfigured discolored nails with subungual debris  from hallux to fifth toes bilaterally. No evidence of bacterial infection or drainage bilaterally.   Orthopedic  No limitations of motion  feet .  No crepitus or effusions noted.  No bony pathology .  Syndactaly 2-3 left foot.  Skin  normotropic skin with  noted bilaterally.  No signs of infections or ulcers noted.   Porokeratosis sub 2 right foot.  Onychomycosis  Nails  B/L.  Pain in right toes  Pain in left toes   Debridement of nails both feet followed trimming the nails with dremel tool.  Debridement of callus with # 15 blade and dremel tool.    RTC  3  months.   Helane Gunther DPM

## 2024-02-25 ENCOUNTER — Other Ambulatory Visit: Payer: Self-pay

## 2024-03-16 NOTE — Progress Notes (Unsigned)
 [facility] [phone]   [phone]  PROGRESS NOTE  Patient Care Team: Wilburn Handler, MD as PCP - General (Family Medicine) Ander Bame, MD as Consulting Physician (Hematology and Oncology)  Hematological/Oncological History # CLL --diagnosis of chronic lymphoid leukemia made 11/04/2018             (a) the cells are positive for CD 04/12/19/22/23, kappa restricted   (1) Rituximab  weekly x 9 weeks beginning 11/23/2018             (a) reaction to first dose (which was 100 mg only).   (2) on maintenance rituximab , first dose 03/07/2019, repeated every 3 months             (a) baseline PET scan obtained on 02/16/2020 was Deauville 2 except for the left iliac nodes, with SUV max 3.1             (b) rituximab  decreased to every 6 months beginning October 2021   (3) Switched to Zanubrutinib  160 mg PO BID in July 2024.   Interval History:  [name] is a 68 year old male with Chronic Lymphocytic Leukemia (CLL) who presents for a follow-up visit while on zanubrutinib . The last visit was on 12/08/2023. In the interim since the last visit he is continued on his Obinutuzumab  therapy.   [name] reports he is tolerating his obinutuzumab  therapy well with no major side effects.  He reports that he is not having any rigors, chills, sweats, nausea, vomiting or diarrhea.  He reports his appetite remains good and his energy is strong.  Overall he is tolerating without any major difficulties.  He notes no change in the color of his urine and his bowel habits are regular.  He notes that he does have some occasional problems with starting the IV line but they always managed to get his dosage and.. Overall, he is at his baseline level of health.  He denies fevers, chills, sweats, shortness of breath or cough. He has no other complaints. A full 10 point ROS is listed below.  Today we discussed options moving forward including continuation of obinutuzumab  alone  versus the addition of venetoclax .  I do worry about the toxicity of venetoclax  and the patient's cognitive function, concerned that he may not be able to express concerns or side effects from the medication.  Additionally we are seeing an excellent response to the obinutuzumab  alone, therefore I think you would be appropriate to continue with just the IV infusion.  After discussing the risks and benefits of this approach the family was in agreement.  MEDICAL HISTORY:  Past Medical History:  Diagnosis Date   Alcoholic cirrhosis (HCC)    Cataract    Chronic lymphoid leukemia (HCC)    Diabetes mellitus without complication (HCC)    High blood pressure 10/24/2004   History of alcohol abuse    Hypercholesterolemia     SURGICAL HISTORY: No past surgical history on file.  SOCIAL HISTORY: Social History   Socioeconomic History   Marital status: Divorced    Spouse name: Not on file   Number of children: Not on file   Years of education: Not on file   Highest education level: Not on file  Occupational History   Not on file  Tobacco Use   Smoking status: Never   Smokeless tobacco: Never  Substance and Sexual Activity   Alcohol use: Not Currently   Drug use: No   Sexual activity: Not on file  Other Topics Concern   Not on file  Social History Narrative   Not on file   Social Drivers of Health   Financial Resource Strain: Not on file  Food Insecurity: No Food Insecurity (10/01/2023)   Hunger Vital Sign    Worried About Running Out of Food in the Last Year: Never true    Ran Out of Food in the Last Year: Never true  Transportation Needs: No Transportation Needs (10/01/2023)   PRAPARE - Administrator, Civil Service (Medical): No    Lack of Transportation (Non-Medical): No  Physical Activity: Not on file  Stress: Not on file  Social Connections: Not on file  Intimate Partner Violence: Not At Risk (10/01/2023)   Humiliation, Afraid, Rape, and Kick questionnaire     Fear of Current or Ex-Partner: No    Emotionally Abused: No    Physically Abused: No    Sexually Abused: No    FAMILY HISTORY: Family History  Problem Relation Age of Onset   Alcohol abuse Father    Cirrhosis Father    Coronary artery disease Father     ALLERGIES:  is allergic to gazyva  [obinutuzumab ] and rituxan  [rituximab ].  MEDICATIONS:  Current Outpatient Medications  Medication Sig Dispense Refill   ACCU-CHEK GUIDE TEST test strip USE TO CHECK BLOOD SUGAR UP TO FOUR TIMES PER DAY.     allopurinol  (ZYLOPRIM ) 300 MG tablet TAKE 1 TABLET DAILY (Patient taking differently: Take 300 mg by mouth in the morning.) 90 tablet 3   amLODipine  (NORVASC ) 2.5 MG tablet Take 2.5 mg by mouth at bedtime.     aspirin  EC 81 MG tablet Take 1 tablet (81 mg total) by mouth daily. Swallow whole. 30 tablet 12   atorvastatin  (LIPITOR) 20 MG tablet Take 1 tablet (20 mg total) by mouth at bedtime. 30 tablet 3   B-D UF III MINI PEN NEEDLES 31G X 5 MM MISC      Blood Glucose Monitoring Suppl (ACCU-CHEK GUIDE) w/Device KIT      carvedilol  (COREG ) 3.125 MG tablet Take 1 tablet (3.125 mg total) by mouth 2 (two) times daily. 60 tablet 3   dapagliflozin  propanediol (FARXIGA ) 10 MG TABS tablet Take 1 tablet (10 mg total) by mouth daily. 30 tablet 3   fexofenadine (ALLEGRA) 180 MG tablet Take 180 mg by mouth at bedtime.     hydrochlorothiazide (HYDRODIURIL) 25 MG tablet Take 25 mg by mouth daily.     losartan  (COZAAR ) 50 MG tablet Take 50 mg by mouth 2 (two) times daily.     metFORMIN  (GLUCOPHAGE -XR) 500 MG 24 hr tablet Take 1 tablet (500 mg total) by mouth 2 (two) times daily with a meal. Take 500 mg by mouth in the morning and 1,000 mg at bedtime (Patient taking differently: Take 500-1,000 mg by mouth See admin instructions. Take 500 mg by mouth in the morning and 1,000 mg at bedtime)     ondansetron  (ZOFRAN ) 8 MG tablet Take 1 tablet (8 mg total) by mouth every 8 (eight) hours as needed. 30 tablet 0   ONETOUCH  DELICA LANCETS FINE MISC 3 (three) times daily.   5   prochlorperazine  (COMPAZINE ) 10 MG tablet Take 1 tablet (10 mg total) by mouth every 6 (six) hours as needed for nausea or vomiting. 30 tablet 0   TOUJEO  SOLOSTAR 300 UNIT/ML SOPN Inject 0-8 Units into the skin See admin instructions. Inject 0-8 units into the skin in the morning and at bedtime, PER SLIDING SCALE: BGL:  0-99 = give nothing; 100-150 = 4 units; 151-200 = 6 units; 201-250 = 8 units     No current facility-administered medications for this visit.    REVIEW OF SYSTEMS:   Constitutional: ( - ) fevers, ( - )  chills , ( - ) night sweats Eyes: ( - ) blurriness of vision, ( - ) double vision, ( - ) watery eyes Ears, nose, mouth, throat, and face: ( - ) mucositis, ( - ) sore throat Respiratory: ( - ) cough, ( - ) dyspnea, ( - ) wheezes Cardiovascular: ( - ) palpitation, ( - ) chest discomfort, ( - ) lower extremity swelling Gastrointestinal:  ( - ) nausea, ( - ) heartburn, ( - ) change in bowel habits Skin: ( - ) abnormal skin rashes Lymphatics: ( - ) new lymphadenopathy, ( - ) easy bruising Neurological: ( - ) numbness, ( - ) tingling, ( - ) new weaknesses Behavioral/Psych: ( - ) mood change, ( - ) new changes  All other systems were reviewed with the patient and are negative.  PHYSICAL EXAMINATION: ECOG PERFORMANCE STATUS: 1 - Symptomatic but completely ambulatory  There were no vitals filed for this visit.      There were no vitals filed for this visit.        GENERAL: Well-appearing elderly male, alert, no distress and comfortable SKIN: skin color, texture, turgor are normal, no rashes or significant lesions EYES: conjunctiva are pink and non-injected, sclera clear LYMPH:  no palpable lymphadenopathy in the cervical or supraclavicular regions.  LUNGS: clear to auscultation and percussion with normal breathing effort HEART: regular rate & rhythm and no murmurs and no lower extremity edema Musculoskeletal: no  cyanosis of digits and no clubbing  PSYCH: alert & oriented x 3, fluent speech NEURO: no focal motor/sensory deficits  LABORATORY DATA:  I have reviewed the data as listed    Latest Ref Rng & Units 02/18/2024    9:45 AM 01/22/2024   10:05 AM 01/08/2024   10:18 AM  CBC  WBC 4.0 - 10.5 K/uL 4.4  3.9  8.2   Hemoglobin 13.0 - 17.0 g/dL 16.1  09.6  04.5   Hematocrit 39.0 - 52.0 % 41.8  38.9  36.0   Platelets 150 - 400 K/uL 101  77  86        Latest Ref Rng & Units 02/18/2024    9:45 AM 01/22/2024   10:05 AM 01/08/2024   10:18 AM  CMP  Glucose 70 - 99 mg/dL 409  811  914   BUN 8 - 23 mg/dL 36  26  31   Creatinine 0.61 - 1.24 mg/dL 7.82  9.56  2.13   Sodium 135 - 145 mmol/L 143  141  139   Potassium 3.5 - 5.1 mmol/L 4.3  4.0  4.2   Chloride 98 - 111 mmol/L 107  107  104   CO2 22 - 32 mmol/L 32  30  30   Calcium  8.9 - 10.3 mg/dL 9.3  8.5  9.3   Total Protein 6.5 - 8.1 g/dL 7.4  6.9  7.0   Total Bilirubin 0.0 - 1.2 mg/dL 0.9  1.1  1.0   Alkaline Phos 38 - 126 U/L 90  70  61   AST 15 - 41 U/L 23  22  17    ALT 0 - 44 U/L 18  20  20      RADIOGRAPHIC STUDIES: No results found.   ASSESSMENT & PLAN Cody [name]  Lynch is a 68 y.o. male with medical history significant for CLL who presents for a follow up visit.   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 Discontinued due to poor tolerance with medication.  --Labs show white blood cell 4.4, hemoglobin 13.0, MCV 86, platelets 101. Creatinine and LFTs normal.  --Currently on Zanubrutinib  160 mg PO BID. will stop this medication as he is having progression of disease. --Plan to proceed with obinutuzumab  monotherapy  The patient is doing particular well on obinutuzumab  therapy alone, will HOLD on starting venetoclax . Discussed risks/benefits with patient and family.  --Return to clinic for Cycle 3 Day 1 of  treatment. (4 weeks)   #Hypertension -Blood pressure readings have been slightly elevated. Patient reports frequent urination, possibly related to antihypertensive medication (Hydrochlorothiazide). -Advise patient to discuss blood pressure readings with PCP for possible dose adjustment.  #General Health Maintenance -Encourage continued physical activity (treadmill walking).      No orders of the defined types were placed in this encounter.   All questions were answered. The patient knows to call the clinic with any problems, questions or concerns.  I have spent a total of 30 minutes minutes of face-to-face and non-face-to-face time, preparing to see the patient, performing a medically appropriate examination, counseling and educating the patient, documenting clinical information in the electronic health record,and care coordination.   Rogerio Clay, MD Department of Hematology/Oncology [facility] Phone: [phone] Pager: [phone] Email: [email or web address]   03/16/2024 10:27 PM  Armando Lance BD, Catovsky D, Caligaris-Cappio F, Dighiero G, Dhner H, Hillmen P, Keating M, Montserrat E, Chiorazzi N, Stilgenbauer S, Rai KR, [place], Eichhorst B, O'Brien S, Robak T, Seymour JF, Kipps TJ. iwCLL guidelines for diagnosis, indications for treatment, response assessment, and supportive management of CLL. Blood. 2018 Jun 21;131(25):2745-2760.  Active disease should be clearly documented to initiate therapy. At least 1 of the following criteria should be met.  1) Evidence of progressive marrow failure as manifested by the development of, or worsening of, anemia and/or thrombocytopenia. Cutoff levels of Hb <10 g/dL or platelet counts <657  109/L are generally regarded as indication for treatment. However, in some patients, platelet counts <100  109/L may remain stable over a long period; this situation does not automatically require  therapeutic intervention. 2) Massive (ie, >=6 cm below the left costal margin) or progressive or symptomatic splenomegaly. 3) Massive nodes (ie, >=10 cm in longest diameter) or progressive or symptomatic lymphadenopathy. 4) Progressive lymphocytosis with an increase of >=50% over a 77-month period, or lymphocyte doubling time (LDT) <6 months. LDT can be obtained by linear regression extrapolation of absolute lymphocyte counts obtained at intervals of 2 weeks over an observation period of 2 to 3 months; patients with initial blood lymphocyte counts <30  109/L may require a longer observation period to determine the LDT. Factors contributing to lymphocytosis other than CLL (eg, infections, steroid administration) should be excluded. 5) Autoimmune complications including anemia or thrombocytopenia poorly responsive to corticosteroids. 6) Symptomatic or functional extranodal involvement (eg, skin, kidney, lung, spine). Disease-related symptoms as defined by any of the following: Unintentional weight loss >=10% within the previous 6 months. Significant fatigue (ie, ECOG performance scale 2 or worse; cannot work or unable to perform usual activities). Fevers >=100.16F or 38.0C for 2 or more weeks without evidence of infection. Night sweats for >=1 month without evidence of infection.

## 2024-03-17 ENCOUNTER — Inpatient Hospital Stay: Payer: Medicare PPO

## 2024-03-17 ENCOUNTER — Inpatient Hospital Stay: Payer: Medicare PPO | Attending: Hematology and Oncology | Admitting: Hematology and Oncology

## 2024-03-17 ENCOUNTER — Other Ambulatory Visit: Payer: Medicare PPO

## 2024-03-17 ENCOUNTER — Inpatient Hospital Stay: Payer: Medicare PPO | Attending: Hematology and Oncology

## 2024-03-17 VITALS — BP 152/77 | HR 79 | Temp 97.6°F | Resp 14 | Wt 191.5 lb

## 2024-03-17 DIAGNOSIS — Z5112 Encounter for antineoplastic immunotherapy: Secondary | ICD-10-CM | POA: Diagnosis not present

## 2024-03-17 DIAGNOSIS — C9110 Chronic lymphocytic leukemia of B-cell type not having achieved remission: Secondary | ICD-10-CM

## 2024-03-17 DIAGNOSIS — Z79620 Long term (current) use of immunosuppressive biologic: Secondary | ICD-10-CM | POA: Diagnosis not present

## 2024-03-17 LAB — CBC WITH DIFFERENTIAL (CANCER CENTER ONLY)
Abs Immature Granulocytes: 0.02 10*3/uL (ref 0.00–0.07)
Basophils Absolute: 0 10*3/uL (ref 0.0–0.1)
Basophils Relative: 1 %
Eosinophils Absolute: 0.3 10*3/uL (ref 0.0–0.5)
Eosinophils Relative: 7 %
HCT: 40.1 % (ref 39.0–52.0)
Hemoglobin: 12.6 g/dL — ABNORMAL LOW (ref 13.0–17.0)
Immature Granulocytes: 1 %
Lymphocytes Relative: 32 %
Lymphs Abs: 1.3 10*3/uL (ref 0.7–4.0)
MCH: 25.9 pg — ABNORMAL LOW (ref 26.0–34.0)
MCHC: 31.4 g/dL (ref 30.0–36.0)
MCV: 82.3 fL (ref 80.0–100.0)
Monocytes Absolute: 0.2 10*3/uL (ref 0.1–1.0)
Monocytes Relative: 4 %
Neutro Abs: 2.3 10*3/uL (ref 1.7–7.7)
Neutrophils Relative %: 55 %
Platelet Count: 144 10*3/uL — ABNORMAL LOW (ref 150–400)
RBC: 4.87 MIL/uL (ref 4.22–5.81)
RDW: 13.6 % (ref 11.5–15.5)
WBC Count: 4.1 10*3/uL (ref 4.0–10.5)
nRBC: 0 % (ref 0.0–0.2)

## 2024-03-17 LAB — CMP (CANCER CENTER ONLY)
ALT: 13 U/L (ref 0–44)
AST: 18 U/L (ref 15–41)
Albumin: 4.4 g/dL (ref 3.5–5.0)
Alkaline Phosphatase: 104 U/L (ref 38–126)
Anion gap: 6 (ref 5–15)
BUN: 25 mg/dL — ABNORMAL HIGH (ref 8–23)
CO2: 32 mmol/L (ref 22–32)
Calcium: 9.5 mg/dL (ref 8.9–10.3)
Chloride: 103 mmol/L (ref 98–111)
Creatinine: 1.21 mg/dL (ref 0.61–1.24)
GFR, Estimated: >60 mL/min (ref >60)
Glucose, Bld: 117 mg/dL — ABNORMAL HIGH (ref 70–99)
Potassium: 4.2 mmol/L (ref 3.5–5.1)
Sodium: 141 mmol/L (ref 135–145)
Total Bilirubin: 0.7 mg/dL (ref 0.0–1.2)
Total Protein: 7.4 g/dL (ref 6.5–8.1)

## 2024-03-17 LAB — LACTATE DEHYDROGENASE: LDH: 136 U/L (ref 98–192)

## 2024-03-17 MED ORDER — DIPHENHYDRAMINE HCL 50 MG/ML IJ SOLN
50.0000 mg | Freq: Once | INTRAMUSCULAR | Status: AC
  Administered 2024-03-17: 50 mg via INTRAVENOUS
  Filled 2024-03-17: qty 1

## 2024-03-17 MED ORDER — ACETAMINOPHEN 325 MG PO TABS
650.0000 mg | ORAL_TABLET | Freq: Once | ORAL | Status: AC
  Administered 2024-03-17: 650 mg via ORAL
  Filled 2024-03-17: qty 2

## 2024-03-17 MED ORDER — SODIUM CHLORIDE 0.9 % IV SOLN: INTRAVENOUS | Status: DC

## 2024-03-17 MED ORDER — SODIUM CHLORIDE 0.9 % IV SOLN
1000.0000 mg | Freq: Once | INTRAVENOUS | Status: AC
  Administered 2024-03-17: 1000 mg via INTRAVENOUS
  Filled 2024-03-17: qty 40

## 2024-03-17 MED ORDER — SODIUM CHLORIDE 0.9 % IV SOLN
20.0000 mg | Freq: Once | INTRAVENOUS | Status: AC
  Administered 2024-03-17: 20 mg via INTRAVENOUS
  Filled 2024-03-17: qty 20

## 2024-03-17 MED ORDER — FAMOTIDINE IN NACL 20-0.9 MG/50ML-% IV SOLN
20.0000 mg | Freq: Once | INTRAVENOUS | Status: AC
  Administered 2024-03-17: 20 mg via INTRAVENOUS
  Filled 2024-03-17: qty 50

## 2024-03-17 NOTE — Patient Instructions (Signed)
 [facility] - A DEPT OF [facility]  Discharge Instructions: Thank you for choosing [facility] to provide your oncology and hematology care.   If you have a lab appointment with the Cancer Center, please go directly to the Cancer Center and check in at the registration area.   Wear comfortable clothing and clothing appropriate for easy access to any Portacath or PICC line.   We strive to give you quality time with your provider. You may need to reschedule your appointment if you arrive late (15 or more minutes).  Arriving late affects you and other patients whose appointments are after yours.  Also, if you miss three or more appointments without notifying the office, you may be dismissed from the clinic at the provider's discretion.      For prescription refill requests, have your pharmacy contact our office and allow 72 hours for refills to be completed.    Today you received the following chemotherapy and/or immunotherapy agents gazyva       To help prevent nausea and vomiting after your treatment, we encourage you to take your nausea medication as directed.  BELOW ARE SYMPTOMS THAT SHOULD BE REPORTED IMMEDIATELY: *FEVER GREATER THAN 100.4 F (38 C) OR HIGHER *CHILLS OR SWEATING *NAUSEA AND VOMITING THAT IS NOT CONTROLLED WITH YOUR NAUSEA MEDICATION *UNUSUAL SHORTNESS OF BREATH *UNUSUAL BRUISING OR BLEEDING *URINARY PROBLEMS (pain or burning when urinating, or frequent urination) *BOWEL PROBLEMS (unusual diarrhea, constipation, pain near the anus) TENDERNESS IN MOUTH AND THROAT WITH OR WITHOUT PRESENCE OF ULCERS (sore throat, sores in mouth, or a toothache) UNUSUAL RASH, SWELLING OR PAIN  UNUSUAL VAGINAL DISCHARGE OR ITCHING   Items with * indicate a potential emergency and should be followed up as soon as possible or go to the Emergency Department if any problems should occur.  Please show the CHEMOTHERAPY ALERT CARD or IMMUNOTHERAPY  ALERT CARD at check-in to the Emergency Department and triage nurse.  Should you have questions after your visit or need to cancel or reschedule your appointment, please contact [facility] - A DEPT OF [facility]  Dept: [phone]  and follow the prompts.  Office hours are 8:00 a.m. to 4:30 p.m. Monday - Friday. Please note that voicemails left after 4:00 p.m. may not be returned until the following business day.  We are closed weekends and major holidays. You have access to a nurse at all times for urgent questions. Please call the main number to the clinic Dept: [phone] and follow the prompts.   For any non-urgent questions, you may also contact your provider using MyChart. We now offer e-Visits for anyone 57 and older to request care online for non-urgent symptoms. For details visit [email or web address].   Also download the MyChart app! Go to the app store, search "MyChart", open the app, select [facility], and log in with your MyChart username and password.

## 2024-03-21 ENCOUNTER — Encounter: Payer: Self-pay | Admitting: Adult Health

## 2024-03-21 DIAGNOSIS — I129 Hypertensive chronic kidney disease with stage 1 through stage 4 chronic kidney disease, or unspecified chronic kidney disease: Secondary | ICD-10-CM | POA: Diagnosis not present

## 2024-03-21 DIAGNOSIS — N189 Chronic kidney disease, unspecified: Secondary | ICD-10-CM | POA: Diagnosis not present

## 2024-03-21 DIAGNOSIS — E1165 Type 2 diabetes mellitus with hyperglycemia: Secondary | ICD-10-CM | POA: Diagnosis not present

## 2024-03-21 DIAGNOSIS — C9190 Lymphoid leukemia, unspecified not having achieved remission: Secondary | ICD-10-CM | POA: Diagnosis not present

## 2024-03-23 ENCOUNTER — Ambulatory Visit: Admitting: Adult Health

## 2024-03-23 NOTE — Telephone Encounter (Signed)
 Patient had TIA. Mood changes would be from something else. Would encourage follow up with PCP or oncologist regarding this concern.

## 2024-04-13 DIAGNOSIS — E119 Type 2 diabetes mellitus without complications: Secondary | ICD-10-CM | POA: Diagnosis not present

## 2024-04-15 ENCOUNTER — Inpatient Hospital Stay: Payer: Medicare PPO

## 2024-04-15 ENCOUNTER — Inpatient Hospital Stay: Payer: Medicare PPO | Admitting: Physician Assistant

## 2024-04-15 ENCOUNTER — Inpatient Hospital Stay: Payer: Medicare PPO | Attending: Hematology and Oncology

## 2024-04-15 VITALS — BP 154/75 | HR 68 | Temp 97.5°F | Resp 16 | Ht 72.0 in | Wt 199.2 lb

## 2024-04-15 VITALS — BP 157/76 | HR 52 | Temp 97.9°F | Resp 16

## 2024-04-15 DIAGNOSIS — Z5112 Encounter for antineoplastic immunotherapy: Secondary | ICD-10-CM | POA: Diagnosis not present

## 2024-04-15 DIAGNOSIS — C9110 Chronic lymphocytic leukemia of B-cell type not having achieved remission: Secondary | ICD-10-CM

## 2024-04-15 DIAGNOSIS — Z79899 Other long term (current) drug therapy: Secondary | ICD-10-CM | POA: Insufficient documentation

## 2024-04-15 LAB — CMP (CANCER CENTER ONLY)
ALT: 22 U/L (ref 0–44)
AST: 24 U/L (ref 15–41)
Albumin: 4.6 g/dL (ref 3.5–5.0)
Alkaline Phosphatase: 82 U/L (ref 38–126)
Anion gap: 7 (ref 5–15)
BUN: 29 mg/dL — ABNORMAL HIGH (ref 8–23)
CO2: 31 mmol/L (ref 22–32)
Calcium: 9.2 mg/dL (ref 8.9–10.3)
Chloride: 103 mmol/L (ref 98–111)
Creatinine: 1.2 mg/dL (ref 0.61–1.24)
GFR, Estimated: >60 mL/min (ref >60)
Glucose, Bld: 109 mg/dL — ABNORMAL HIGH (ref 70–99)
Potassium: 3.9 mmol/L (ref 3.5–5.1)
Sodium: 141 mmol/L (ref 135–145)
Total Bilirubin: 1 mg/dL (ref 0.0–1.2)
Total Protein: 7.3 g/dL (ref 6.5–8.1)

## 2024-04-15 LAB — CBC WITH DIFFERENTIAL (CANCER CENTER ONLY)
Abs Immature Granulocytes: 0.02 10*3/uL (ref 0.00–0.07)
Basophils Absolute: 0 10*3/uL (ref 0.0–0.1)
Basophils Relative: 1 %
Eosinophils Absolute: 0.2 10*3/uL (ref 0.0–0.5)
Eosinophils Relative: 5 %
HCT: 41.5 % (ref 39.0–52.0)
Hemoglobin: 13.2 g/dL (ref 13.0–17.0)
Immature Granulocytes: 1 %
Lymphocytes Relative: 44 %
Lymphs Abs: 1.7 10*3/uL (ref 0.7–4.0)
MCH: 26.4 pg (ref 26.0–34.0)
MCHC: 31.8 g/dL (ref 30.0–36.0)
MCV: 83 fL (ref 80.0–100.0)
Monocytes Absolute: 0.1 10*3/uL (ref 0.1–1.0)
Monocytes Relative: 3 %
Neutro Abs: 1.8 10*3/uL (ref 1.7–7.7)
Neutrophils Relative %: 46 %
Platelet Count: 101 10*3/uL — ABNORMAL LOW (ref 150–400)
RBC: 5 MIL/uL (ref 4.22–5.81)
RDW: 14.6 % (ref 11.5–15.5)
WBC Count: 3.9 10*3/uL — ABNORMAL LOW (ref 4.0–10.5)
nRBC: 0 % (ref 0.0–0.2)

## 2024-04-15 MED ORDER — ACETAMINOPHEN 325 MG PO TABS
650.0000 mg | ORAL_TABLET | Freq: Once | ORAL | Status: AC
  Administered 2024-04-15: 650 mg via ORAL
  Filled 2024-04-15: qty 2

## 2024-04-15 MED ORDER — DEXAMETHASONE SODIUM PHOSPHATE 100 MG/10ML IJ SOLN
20.0000 mg | Freq: Once | INTRAMUSCULAR | Status: AC
  Administered 2024-04-15: 20 mg via INTRAVENOUS
  Filled 2024-04-15: qty 20

## 2024-04-15 MED ORDER — DIPHENHYDRAMINE HCL 50 MG/ML IJ SOLN
50.0000 mg | Freq: Once | INTRAMUSCULAR | Status: AC
  Administered 2024-04-15: 50 mg via INTRAVENOUS
  Filled 2024-04-15: qty 1

## 2024-04-15 MED ORDER — SODIUM CHLORIDE 0.9 % IV SOLN
INTRAVENOUS | Status: DC
Start: 2024-04-15 — End: 2024-04-15

## 2024-04-15 MED ORDER — FAMOTIDINE IN NACL 20-0.9 MG/50ML-% IV SOLN
20.0000 mg | Freq: Once | INTRAVENOUS | Status: AC
  Administered 2024-04-15: 20 mg via INTRAVENOUS
  Filled 2024-04-15: qty 50

## 2024-04-15 MED ORDER — SODIUM CHLORIDE 0.9 % IV SOLN
1000.0000 mg | Freq: Once | INTRAVENOUS | Status: AC
  Administered 2024-04-15: 1000 mg via INTRAVENOUS
  Filled 2024-04-15: qty 40

## 2024-04-15 NOTE — Progress Notes (Signed)
 [facility] [phone]   [phone]  PROGRESS NOTE  Patient Care Team: Wilburn Handler, MD as PCP - General (Family Medicine) Ander Bame, MD as Consulting Physician (Hematology and Oncology)  Hematological/Oncological History # CLL --diagnosis of chronic lymphoid leukemia made 11/04/2018             (a) the cells are positive for CD 04/12/19/22/23, kappa restricted   (1) Rituximab  weekly x 9 weeks beginning 11/23/2018             (a) reaction to first dose (which was 100 mg only).   (2) on maintenance rituximab , first dose 03/07/2019, repeated every 3 months             (a) baseline PET scan obtained on 02/16/2020 was Deauville 2 except for the left iliac nodes, with SUV max 3.1             (b) rituximab  decreased to every 6 months beginning October 2021   (3) Switched to Zanubrutinib  160 mg PO BID in July 2024.   (4) due to progression transitioned to Obinutuzumab  x 8 cycles.   Interval History:  Cody [name] is a 68 year old male with Chronic Lymphocytic Leukemia (CLL) who presents for a follow-up visit while on zanubrutinib . The last visit was on 03/17/2024. In the interim since the last visit he is continued on his Obinutuzumab  therapy.   Cody [name] reports he has been well overall in the interim since our last visit.  He is tolerating his obinutuzumab  treatments well with no major issues.  His energy and appetite are overall stable.  He continues to walk on the treadmill 3 times a week.  He denies any nausea, vomiting or bowel habit changes.  He denies easy bruising or signs of active bleeding.  He denies fevers, chills, sweats, shortness of breath, chest pain or cough.  He has no other complaints.  Rest of the 10 point ROS as below.  MEDICAL HISTORY:  Past Medical History:  Diagnosis Date   Alcoholic cirrhosis (HCC)    Cataract    Chronic lymphoid leukemia (HCC)    Diabetes mellitus without complication (HCC)    High blood  pressure 10/24/2004   History of alcohol abuse    Hypercholesterolemia     SURGICAL HISTORY: No past surgical history on file.  SOCIAL HISTORY: Social History   Socioeconomic History   Marital status: Divorced    Spouse name: Not on file   Number of children: Not on file   Years of education: Not on file   Highest education level: Not on file  Occupational History   Not on file  Tobacco Use   Smoking status: Never   Smokeless tobacco: Never  Substance and Sexual Activity   Alcohol use: Not Currently   Drug use: No   Sexual activity: Not on file  Other Topics Concern   Not on file  Social History Narrative   Not on file   Social Drivers of Health   Financial Resource Strain: Not on file  Food Insecurity: No Food Insecurity (10/01/2023)   Hunger Vital Sign    Worried About Running Out of Food in the Last Year: Never true    Ran Out of Food in the Last Year: Never true  Transportation Needs: No Transportation Needs (10/01/2023)   PRAPARE - Administrator, Civil Service (Medical): No    Lack of Transportation (Non-Medical): No  Physical Activity: Not on file  Stress: Not on file  Social Connections: Not on file  Intimate Partner Violence: Not At Risk (10/01/2023)   Humiliation, Afraid, Rape, and Kick questionnaire    Fear of Current or Ex-Partner: No    Emotionally Abused: No    Physically Abused: No    Sexually Abused: No    FAMILY HISTORY: Family History  Problem Relation Age of Onset   Alcohol abuse Father    Cirrhosis Father    Coronary artery disease Father     ALLERGIES:  is allergic to gazyva  [obinutuzumab ] and rituxan  [rituximab ].  MEDICATIONS:  Current Outpatient Medications  Medication Sig Dispense Refill   ACCU-CHEK GUIDE TEST test strip USE TO CHECK BLOOD SUGAR UP TO FOUR TIMES PER DAY.     allopurinol  (ZYLOPRIM ) 300 MG tablet TAKE 1 TABLET DAILY (Patient taking differently: Take 300 mg by mouth in the morning.) 90 tablet 3    amLODipine  (NORVASC ) 2.5 MG tablet Take 2.5 mg by mouth at bedtime.     aspirin  EC 81 MG tablet Take 1 tablet (81 mg total) by mouth daily. Swallow whole. 30 tablet 12   atorvastatin  (LIPITOR) 20 MG tablet Take 1 tablet (20 mg total) by mouth at bedtime. 30 tablet 3   B-D UF III MINI PEN NEEDLES 31G X 5 MM MISC      Blood Glucose Monitoring Suppl (ACCU-CHEK GUIDE) w/Device KIT      carvedilol  (COREG ) 3.125 MG tablet Take 1 tablet (3.125 mg total) by mouth 2 (two) times daily. 60 tablet 3   dapagliflozin  propanediol (FARXIGA ) 10 MG TABS tablet Take 1 tablet (10 mg total) by mouth daily. 30 tablet 3   fexofenadine (ALLEGRA) 180 MG tablet Take 180 mg by mouth at bedtime.     hydrochlorothiazide (HYDRODIURIL) 25 MG tablet Take 25 mg by mouth daily.     losartan  (COZAAR ) 50 MG tablet Take 50 mg by mouth 2 (two) times daily.     metFORMIN  (GLUCOPHAGE -XR) 500 MG 24 hr tablet Take 1 tablet (500 mg total) by mouth 2 (two) times daily with a meal. Take 500 mg by mouth in the morning and 1,000 mg at bedtime (Patient taking differently: Take 500-1,000 mg by mouth See admin instructions. Take 500 mg by mouth in the morning and 1,000 mg at bedtime)     ondansetron  (ZOFRAN ) 8 MG tablet Take 1 tablet (8 mg total) by mouth every 8 (eight) hours as needed. 30 tablet 0   ONETOUCH DELICA LANCETS FINE MISC 3 (three) times daily.   5   prochlorperazine  (COMPAZINE ) 10 MG tablet Take 1 tablet (10 mg total) by mouth every 6 (six) hours as needed for nausea or vomiting. 30 tablet 0   TOUJEO  SOLOSTAR 300 UNIT/ML SOPN Inject 0-8 Units into the skin See admin instructions. Inject 0-8 units into the skin in the morning and at bedtime, PER SLIDING SCALE: BGL: 0-99 = give nothing; 100-150 = 4 units; 151-200 = 6 units; 201-250 = 8 units     No current facility-administered medications for this visit.    REVIEW OF SYSTEMS:   Constitutional: ( - ) fevers, ( - )  chills , ( - ) night sweats Eyes: ( - ) blurriness of vision, ( - )  double vision, ( - ) watery eyes Ears, nose, mouth, throat, and face: ( - ) mucositis, ( - ) sore throat Respiratory: ( - ) cough, ( - ) dyspnea, ( - ) wheezes Cardiovascular: ( - ) palpitation, ( - ) chest discomfort, ( - )  lower extremity swelling Gastrointestinal:  ( - ) nausea, ( - ) heartburn, ( - ) change in bowel habits Skin: ( - ) abnormal skin rashes Lymphatics: ( - ) new lymphadenopathy, ( - ) easy bruising Neurological: ( - ) numbness, ( - ) tingling, ( - ) new weaknesses Behavioral/Psych: ( - ) mood change, ( - ) new changes  All other systems were reviewed with the patient and are negative.  PHYSICAL EXAMINATION: ECOG PERFORMANCE STATUS: 1 - Symptomatic but completely ambulatory  Vitals:   04/15/24 1106  BP: (!) 154/75  Pulse: 68  Resp: 16  Temp: (!) 97.5 F (36.4 C)  SpO2: 96%     Filed Weights   04/15/24 1106  Weight: 199 lb 3.2 oz (90.4 kg)      GENERAL: Well-appearing elderly male, alert, no distress and comfortable SKIN: skin color, texture, turgor are normal, no rashes or significant lesions EYES: conjunctiva are pink and non-injected, sclera clear LUNGS: clear to auscultation and percussion with normal breathing effort HEART: regular rate & rhythm and no murmurs and no lower extremity edema Musculoskeletal: no cyanosis of digits and no clubbing  PSYCH: alert & oriented x 3, fluent speech NEURO: no focal motor/sensory deficits  LABORATORY DATA:  I have reviewed the data as listed    Latest Ref Rng & Units 04/15/2024   10:01 AM 03/17/2024    9:42 AM 02/18/2024    9:45 AM  CBC  WBC 4.0 - 10.5 K/uL 3.9  4.1  4.4   Hemoglobin 13.0 - 17.0 g/dL 32.4  40.1  02.7   Hematocrit 39.0 - 52.0 % 41.5  40.1  41.8   Platelets 150 - 400 K/uL 101  144  101        Latest Ref Rng & Units 04/15/2024   10:01 AM 03/17/2024    9:42 AM 02/18/2024    9:45 AM  CMP  Glucose 70 - 99 mg/dL 253  664  403   BUN 8 - 23 mg/dL 29  25  36   Creatinine 0.61 - 1.24 mg/dL 4.74   2.59  5.63   Sodium 135 - 145 mmol/L 141  141  143   Potassium 3.5 - 5.1 mmol/L 3.9  4.2  4.3   Chloride 98 - 111 mmol/L 103  103  107   CO2 22 - 32 mmol/L 31  32  32   Calcium  8.9 - 10.3 mg/dL 9.2  9.5  9.3   Total Protein 6.5 - 8.1 g/dL 7.3  7.4  7.4   Total Bilirubin 0.0 - 1.2 mg/dL 1.0  0.7  0.9   Alkaline Phos 38 - 126 U/L 82  104  90   AST 15 - 41 U/L 24  18  23    ALT 0 - 44 U/L 22  13  18      RADIOGRAPHIC STUDIES: No results found.   ASSESSMENT & PLAN Cody [name] is a 68 y.o. male with medical history significant for CLL who presents for a follow up visit.   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 Discontinued due to poor tolerance with medication.  --Labs show white blood cell 3.9, hemoglobin 13.2, MCV 83.0, platelets 101. Creatinine and LFTs normal.  --will proceed with obinutuzumab  monotherapy.  The patient is doing particular well on obinutuzumab  therapy alone, will HOLD on starting venetoclax . Discussed risks/benefits with patient and family.  --Return to clinic  for Cycle 6 Day 1 of treatment. (4 weeks)   #Hypertension -Blood pressure readings have been slightly elevated. Patient reports frequent urination, possibly related to antihypertensive medication (Hydrochlorothiazide). -Advise patient to discuss blood pressure readings with PCP for possible dose adjustment.  #General Health Maintenance -Encourage continued physical activity (treadmill walking).    No orders of the defined types were placed in this encounter.   All questions were answered. The patient knows to call the clinic with any problems, questions or concerns.  I have spent a total of 30 minutes minutes of face-to-face and non-face-to-face time, preparing to see the patient, performing a medically appropriate examination, counseling and educating the patient,  documenting clinical information in the electronic health record,and care coordination.   Wyline Hearing PA-C Dept of Hematology and Oncology [facility] Phone: [phone]    04/15/2024 11:18 AM  Armando Lance BD, Nicki Barnacle, Caligaris-Cappio F, Dighiero G, Dhner H, Hillmen P, [place], Montserrat E, Chiorazzi N, [place], Rai KR, [place], Eichhorst B, O'Brien S, Robak T, Seymour JF, Cisco UJ. iwCLL guidelines for diagnosis, indications for treatment, response assessment, and supportive management of CLL. Blood. 2018 Jun 21;131(25):2745-2760.  Active disease should be clearly documented to initiate therapy. At least 1 of the following criteria should be met.  1) Evidence of progressive marrow failure as manifested by the development of, or worsening of, anemia and/or thrombocytopenia. Cutoff levels of Hb <10 g/dL or platelet counts <811  109/L are generally regarded as indication for treatment. However, in some patients, platelet counts <100  109/L may remain stable over a long period; this situation does not automatically require therapeutic intervention. 2) Massive (ie, >=6 cm below the left costal margin) or progressive or symptomatic splenomegaly. 3) Massive nodes (ie, >=10 cm in longest diameter) or progressive or symptomatic lymphadenopathy. 4) Progressive lymphocytosis with an increase of >=50% over a 36-month period, or lymphocyte doubling time (LDT) <6 months. LDT can be obtained by linear regression extrapolation of absolute lymphocyte counts obtained at intervals of 2 weeks over an observation period of 2 to 3 months; patients with initial blood lymphocyte counts <30  109/L may require a longer observation period to determine the LDT. Factors contributing to lymphocytosis other than CLL (eg, infections, steroid administration) should be excluded. 5) Autoimmune complications including anemia or thrombocytopenia poorly responsive to  corticosteroids. 6) Symptomatic or functional extranodal involvement (eg, skin, kidney, lung, spine). Disease-related symptoms as defined by any of the following: Unintentional weight loss >=10% within the previous 6 months. Significant fatigue (ie, ECOG performance scale 2 or worse; cannot work or unable to perform usual activities). Fevers >=100.66F or 38.0C for 2 or more weeks without evidence of infection. Night sweats for >=1 month without evidence of infection.

## 2024-04-15 NOTE — Patient Instructions (Signed)
 [facility] - A DEPT OF [facility]  Discharge Instructions: Thank you for choosing [facility] to provide your oncology and hematology care.   If you have a lab appointment with the Cancer Center, please go directly to the Cancer Center and check in at the registration area.   Wear comfortable clothing and clothing appropriate for easy access to any Portacath or PICC line.   We strive to give you quality time with your provider. You may need to reschedule your appointment if you arrive late (15 or more minutes).  Arriving late affects you and other patients whose appointments are after yours.  Also, if you miss three or more appointments without notifying the office, you may be dismissed from the clinic at the provider's discretion.      For prescription refill requests, have your pharmacy contact our office and allow 72 hours for refills to be completed.    Today you received the following chemotherapy and/or immunotherapy agents gazyva       To help prevent nausea and vomiting after your treatment, we encourage you to take your nausea medication as directed.  BELOW ARE SYMPTOMS THAT SHOULD BE REPORTED IMMEDIATELY: *FEVER GREATER THAN 100.4 F (38 C) OR HIGHER *CHILLS OR SWEATING *NAUSEA AND VOMITING THAT IS NOT CONTROLLED WITH YOUR NAUSEA MEDICATION *UNUSUAL SHORTNESS OF BREATH *UNUSUAL BRUISING OR BLEEDING *URINARY PROBLEMS (pain or burning when urinating, or frequent urination) *BOWEL PROBLEMS (unusual diarrhea, constipation, pain near the anus) TENDERNESS IN MOUTH AND THROAT WITH OR WITHOUT PRESENCE OF ULCERS (sore throat, sores in mouth, or a toothache) UNUSUAL RASH, SWELLING OR PAIN  UNUSUAL VAGINAL DISCHARGE OR ITCHING   Items with * indicate a potential emergency and should be followed up as soon as possible or go to the Emergency Department if any problems should occur.  Please show the CHEMOTHERAPY ALERT CARD or IMMUNOTHERAPY  ALERT CARD at check-in to the Emergency Department and triage nurse.  Should you have questions after your visit or need to cancel or reschedule your appointment, please contact [facility] - A DEPT OF [facility]  Dept: [phone]  and follow the prompts.  Office hours are 8:00 a.m. to 4:30 p.m. Monday - Friday. Please note that voicemails left after 4:00 p.m. may not be returned until the following business day.  We are closed weekends and major holidays. You have access to a nurse at all times for urgent questions. Please call the main number to the clinic Dept: [phone] and follow the prompts.   For any non-urgent questions, you may also contact your provider using MyChart. We now offer e-Visits for anyone 57 and older to request care online for non-urgent symptoms. For details visit [email or web address].   Also download the MyChart app! Go to the app store, search "MyChart", open the app, select [facility], and log in with your MyChart username and password.

## 2024-04-27 ENCOUNTER — Encounter: Payer: Self-pay | Admitting: Nurse Practitioner

## 2024-05-12 MED FILL — Dexamethasone Sodium Phosphate Inj 100 MG/10ML: INTRAMUSCULAR | Qty: 2 | Status: AC

## 2024-05-12 NOTE — Progress Notes (Unsigned)
 [facility] [phone]   Fax:([phone]  PROGRESS NOTE  Patient Care Team: Wilburn Handler, MD as PCP - General (Family Medicine) Ander Bame, MD as Consulting Physician (Hematology and Oncology)  Hematological/Oncological History # CLL --diagnosis of chronic lymphoid leukemia made 11/04/2018             (a) the cells are positive for CD 04/12/19/22/23, kappa restricted   (1) Rituximab  weekly x 9 weeks beginning 11/23/2018             (a) reaction to first dose (which was 100 mg only).   (2) on maintenance rituximab , first dose 03/07/2019, repeated every 3 months             (a) baseline PET scan obtained on 02/16/2020 was Deauville 2 except for the left iliac nodes, with SUV max 3.1             (b) rituximab  decreased to every 6 months beginning October 2021   (3) Switched to Zanubrutinib  160 mg PO BID in July 2024.   (4) due to progression transitioned to Obinutuzumab  x 8 cycles.   Interval History:  Cody [name] is a 68 year old male with Chronic Lymphocytic Leukemia (CLL) who presents for a follow-up visit while on zanubrutinib . The last visit was on 04/15/2024. In the interim since the last visit he is continued on his Obinutuzumab  therapy.   Cody [name] reports ***  MEDICAL HISTORY:  Past Medical History:  Diagnosis Date   Alcoholic cirrhosis (HCC)    Cataract    Chronic lymphoid leukemia (HCC)    Diabetes mellitus without complication (HCC)    High blood pressure 10/24/2004   History of alcohol abuse    Hypercholesterolemia     SURGICAL HISTORY: No past surgical history on file.  SOCIAL HISTORY: Social History   Socioeconomic History   Marital status: Divorced    Spouse name: Not on file   Number of children: Not on file   Years of education: Not on file   Highest education level: Not on file  Occupational History   Not on file  Tobacco Use   Smoking status: Never   Smokeless tobacco: Never  Substance and Sexual  Activity   Alcohol use: Not Currently   Drug use: No   Sexual activity: Not on file  Other Topics Concern   Not on file  Social History Narrative   Not on file   Social Drivers of Health   Financial Resource Strain: Not on file  Food Insecurity: No Food Insecurity (10/01/2023)   Hunger Vital Sign    Worried About Running Out of Food in the Last Year: Never true    Ran Out of Food in the Last Year: Never true  Transportation Needs: No Transportation Needs (10/01/2023)   PRAPARE - Administrator, Civil Service (Medical): No    Lack of Transportation (Non-Medical): No  Physical Activity: Not on file  Stress: Not on file  Social Connections: Not on file  Intimate Partner Violence: Not At Risk (10/01/2023)   Humiliation, Afraid, Rape, and Kick questionnaire    Fear of Current or Ex-Partner: No    Emotionally Abused: No    Physically Abused: No    Sexually Abused: No    FAMILY HISTORY: Family History  Problem Relation Age of Onset   Alcohol abuse Father    Cirrhosis Father    Coronary artery disease Father     ALLERGIES:  is allergic  to gazyva  [obinutuzumab ] and rituxan  [rituximab ].  MEDICATIONS:  Current Outpatient Medications  Medication Sig Dispense Refill   ACCU-CHEK GUIDE TEST test strip USE TO CHECK BLOOD SUGAR UP TO FOUR TIMES PER DAY.     allopurinol  (ZYLOPRIM ) 300 MG tablet TAKE 1 TABLET DAILY (Patient taking differently: Take 300 mg by mouth in the morning.) 90 tablet 3   amLODipine  (NORVASC ) 2.5 MG tablet Take 2.5 mg by mouth at bedtime.     aspirin  EC 81 MG tablet Take 1 tablet (81 mg total) by mouth daily. Swallow whole. 30 tablet 12   atorvastatin  (LIPITOR) 20 MG tablet Take 1 tablet (20 mg total) by mouth at bedtime. 30 tablet 3   B-D UF III MINI PEN NEEDLES 31G X 5 MM MISC      Blood Glucose Monitoring Suppl (ACCU-CHEK GUIDE) w/Device KIT      carvedilol  (COREG ) 3.125 MG tablet Take 1 tablet (3.125 mg total) by mouth 2 (two) times daily. 60 tablet  3   dapagliflozin  propanediol (FARXIGA ) 10 MG TABS tablet Take 1 tablet (10 mg total) by mouth daily. 30 tablet 3   fexofenadine (ALLEGRA) 180 MG tablet Take 180 mg by mouth at bedtime.     hydrochlorothiazide (HYDRODIURIL) 25 MG tablet Take 25 mg by mouth daily.     losartan  (COZAAR ) 50 MG tablet Take 50 mg by mouth 2 (two) times daily.     metFORMIN  (GLUCOPHAGE -XR) 500 MG 24 hr tablet Take 1 tablet (500 mg total) by mouth 2 (two) times daily with a meal. Take 500 mg by mouth in the morning and 1,000 mg at bedtime (Patient taking differently: Take 500-1,000 mg by mouth See admin instructions. Take 500 mg by mouth in the morning and 1,000 mg at bedtime)     ondansetron  (ZOFRAN ) 8 MG tablet Take 1 tablet (8 mg total) by mouth every 8 (eight) hours as needed. 30 tablet 0   ONETOUCH DELICA LANCETS FINE MISC 3 (three) times daily.   5   prochlorperazine  (COMPAZINE ) 10 MG tablet Take 1 tablet (10 mg total) by mouth every 6 (six) hours as needed for nausea or vomiting. 30 tablet 0   TOUJEO  SOLOSTAR 300 UNIT/ML SOPN Inject 0-8 Units into the skin See admin instructions. Inject 0-8 units into the skin in the morning and at bedtime, PER SLIDING SCALE: BGL: 0-99 = give nothing; 100-150 = 4 units; 151-200 = 6 units; 201-250 = 8 units     No current facility-administered medications for this visit.    REVIEW OF SYSTEMS:   Constitutional: ( - ) fevers, ( - )  chills , ( - ) night sweats Eyes: ( - ) blurriness of vision, ( - ) double vision, ( - ) watery eyes Ears, nose, mouth, throat, and face: ( - ) mucositis, ( - ) sore throat Respiratory: ( - ) cough, ( - ) dyspnea, ( - ) wheezes Cardiovascular: ( - ) palpitation, ( - ) chest discomfort, ( - ) lower extremity swelling Gastrointestinal:  ( - ) nausea, ( - ) heartburn, ( - ) change in bowel habits Skin: ( - ) abnormal skin rashes Lymphatics: ( - ) new lymphadenopathy, ( - ) easy bruising Neurological: ( - ) numbness, ( - ) tingling, ( - ) new  weaknesses Behavioral/Psych: ( - ) mood change, ( - ) new changes  All other systems were reviewed with the patient and are negative.  PHYSICAL EXAMINATION: ECOG PERFORMANCE STATUS: 1 - Symptomatic but completely ambulatory  Vitals:   05/13/24 0838  BP: 134/67  Pulse: 70  Resp: 18  Temp: 97.9 F (36.6 C)  SpO2: 98%      Filed Weights   05/13/24 0838  Weight: 203 lb 1.6 oz (92.1 kg)       GENERAL: Well-appearing elderly male, alert, no distress and comfortable SKIN: skin color, texture, turgor are normal, no rashes or significant lesions EYES: conjunctiva are pink and non-injected, sclera clear LUNGS: clear to auscultation and percussion with normal breathing effort HEART: regular rate & rhythm and no murmurs and no lower extremity edema Musculoskeletal: no cyanosis of digits and no clubbing  PSYCH: alert & oriented x 3, fluent speech NEURO: no focal motor/sensory deficits  LABORATORY DATA:  I have reviewed the data as listed    Latest Ref Rng & Units 05/13/2024    8:04 AM 04/15/2024   10:01 AM 03/17/2024    9:42 AM  CBC  WBC 4.0 - 10.5 K/uL 4.3  3.9  4.1   Hemoglobin 13.0 - 17.0 g/dL 40.9  81.1  91.4   Hematocrit 39.0 - 52.0 % 41.1  41.5  40.1   Platelets 150 - 400 K/uL 102  101  144        Latest Ref Rng & Units 04/15/2024   10:01 AM 03/17/2024    9:42 AM 02/18/2024    9:45 AM  CMP  Glucose 70 - 99 mg/dL 782  956  213   BUN 8 - 23 mg/dL 29  25  36   Creatinine 0.61 - 1.24 mg/dL 0.86  5.78  4.69   Sodium 135 - 145 mmol/L 141  141  143   Potassium 3.5 - 5.1 mmol/L 3.9  4.2  4.3   Chloride 98 - 111 mmol/L 103  103  107   CO2 22 - 32 mmol/L 31  32  32   Calcium  8.9 - 10.3 mg/dL 9.2  9.5  9.3   Total Protein 6.5 - 8.1 g/dL 7.3  7.4  7.4   Total Bilirubin 0.0 - 1.2 mg/dL 1.0  0.7  0.9   Alkaline Phos 38 - 126 U/L 82  104  90   AST 15 - 41 U/L 24  18  23    ALT 0 - 44 U/L 22  13  18      RADIOGRAPHIC STUDIES: No results found.   ASSESSMENT & PLAN Cody [name] is a 68 y.o. male with medical history significant for CLL who presents for a follow up visit.   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 Discontinued due to poor tolerance with medication.  --Labs show white blood cell 4.3, Hgb 13.0, MCV 82.7, Plt 102. Creatinine and LFTs normal.  --will proceed with obinutuzumab  monotherapy.  The patient is doing particular well on obinutuzumab  therapy alone, will HOLD on starting venetoclax . Discussed risks/benefits with patient and family.  --plan for a total of 8 cycles of Obinutuzumab   --Return to clinic for Cycle 7 Day 1 of treatment. (4 weeks)   #Hypertension -Blood pressure readings have been slightly elevated. Patient reports frequent urination, possibly related to antihypertensive medication (Hydrochlorothiazide). -Advise patient to discuss blood pressure readings with PCP for possible dose adjustment.  #General Health Maintenance -Encourage continued physical activity (treadmill walking).    No orders of the defined types were placed in this encounter.   All questions were answered. The patient knows to call the  clinic with any problems, questions or concerns.  I have spent a total of 30 minutes minutes of face-to-face and non-face-to-face time, preparing to see the patient, performing a medically appropriate examination, counseling and educating the patient, documenting clinical information in the electronic health record,and care coordination.   Wyline Hearing PA-C Dept of Hematology and Oncology [facility] Phone: [phone]    05/13/2024 8:58 AM  Armando Lance BD, Nicki Barnacle, Caligaris-Cappio F, Dighiero G, Dhner H, Hillmen P, [place], Montserrat E, Chiorazzi N, [place], Rai KR, [place], Eichhorst B, O'Brien S, Robak T, Seymour JF, Dighton QM. iwCLL  guidelines for diagnosis, indications for treatment, response assessment, and supportive management of CLL. Blood. 2018 Jun 21;131(25):2745-2760.  Active disease should be clearly documented to initiate therapy. At least 1 of the following criteria should be met.  1) Evidence of progressive marrow failure as manifested by the development of, or worsening of, anemia and/or thrombocytopenia. Cutoff levels of Hb <10 g/dL or platelet counts <086  109/L are generally regarded as indication for treatment. However, in some patients, platelet counts <100  109/L may remain stable over a long period; this situation does not automatically require therapeutic intervention. 2) Massive (ie, >=6 cm below the left costal margin) or progressive or symptomatic splenomegaly. 3) Massive nodes (ie, >=10 cm in longest diameter) or progressive or symptomatic lymphadenopathy. 4) Progressive lymphocytosis with an increase of >=50% over a 60-month period, or lymphocyte doubling time (LDT) <6 months. LDT can be obtained by linear regression extrapolation of absolute lymphocyte counts obtained at intervals of 2 weeks over an observation period of 2 to 3 months; patients with initial blood lymphocyte counts <30  109/L may require a longer observation period to determine the LDT. Factors contributing to lymphocytosis other than CLL (eg, infections, steroid administration) should be excluded. 5) Autoimmune complications including anemia or thrombocytopenia poorly responsive to corticosteroids. 6) Symptomatic or functional extranodal involvement (eg, skin, kidney, lung, spine). Disease-related symptoms as defined by any of the following: Unintentional weight loss >=10% within the previous 6 months. Significant fatigue (ie, ECOG performance scale 2 or worse; cannot work or unable to perform usual activities). Fevers >=100.48F or 38.0C for 2 or more weeks without evidence of infection. Night sweats for >=1 month without evidence of  infection.

## 2024-05-13 ENCOUNTER — Encounter: Payer: Self-pay | Admitting: Hematology and Oncology

## 2024-05-13 ENCOUNTER — Inpatient Hospital Stay: Attending: Hematology and Oncology

## 2024-05-13 ENCOUNTER — Inpatient Hospital Stay: Admitting: Hematology and Oncology

## 2024-05-13 VITALS — BP 146/65 | HR 73 | Temp 98.2°F | Resp 18

## 2024-05-13 VITALS — BP 134/67 | HR 70 | Temp 97.9°F | Resp 18 | Wt 203.1 lb

## 2024-05-13 DIAGNOSIS — Z5112 Encounter for antineoplastic immunotherapy: Secondary | ICD-10-CM | POA: Insufficient documentation

## 2024-05-13 DIAGNOSIS — C9110 Chronic lymphocytic leukemia of B-cell type not having achieved remission: Secondary | ICD-10-CM | POA: Diagnosis not present

## 2024-05-13 DIAGNOSIS — Z79899 Other long term (current) drug therapy: Secondary | ICD-10-CM | POA: Diagnosis not present

## 2024-05-13 DIAGNOSIS — D696 Thrombocytopenia, unspecified: Secondary | ICD-10-CM | POA: Diagnosis not present

## 2024-05-13 LAB — CBC WITH DIFFERENTIAL (CANCER CENTER ONLY)
Abs Immature Granulocytes: 0.02 10*3/uL (ref 0.00–0.07)
Basophils Absolute: 0 10*3/uL (ref 0.0–0.1)
Basophils Relative: 1 %
Eosinophils Absolute: 0.3 10*3/uL (ref 0.0–0.5)
Eosinophils Relative: 8 %
HCT: 41.1 % (ref 39.0–52.0)
Hemoglobin: 13 g/dL (ref 13.0–17.0)
Immature Granulocytes: 1 %
Lymphocytes Relative: 39 %
Lymphs Abs: 1.7 10*3/uL (ref 0.7–4.0)
MCH: 26.2 pg (ref 26.0–34.0)
MCHC: 31.6 g/dL (ref 30.0–36.0)
MCV: 82.7 fL (ref 80.0–100.0)
Monocytes Absolute: 0.2 10*3/uL (ref 0.1–1.0)
Monocytes Relative: 5 %
Neutro Abs: 2 10*3/uL (ref 1.7–7.7)
Neutrophils Relative %: 46 %
Platelet Count: 102 10*3/uL — ABNORMAL LOW (ref 150–400)
RBC: 4.97 MIL/uL (ref 4.22–5.81)
RDW: 15 % (ref 11.5–15.5)
WBC Count: 4.3 10*3/uL (ref 4.0–10.5)
nRBC: 0 % (ref 0.0–0.2)

## 2024-05-13 LAB — CMP (CANCER CENTER ONLY)
ALT: 23 U/L (ref 0–44)
AST: 22 U/L (ref 15–41)
Albumin: 4.6 g/dL (ref 3.5–5.0)
Alkaline Phosphatase: 76 U/L (ref 38–126)
Anion gap: 6 (ref 5–15)
BUN: 37 mg/dL — ABNORMAL HIGH (ref 8–23)
CO2: 29 mmol/L (ref 22–32)
Calcium: 9.4 mg/dL (ref 8.9–10.3)
Chloride: 107 mmol/L (ref 98–111)
Creatinine: 1.26 mg/dL — ABNORMAL HIGH (ref 0.61–1.24)
GFR, Estimated: >60 mL/min (ref >60)
Glucose, Bld: 132 mg/dL — ABNORMAL HIGH (ref 70–99)
Potassium: 4.3 mmol/L (ref 3.5–5.1)
Sodium: 142 mmol/L (ref 135–145)
Total Bilirubin: 0.8 mg/dL (ref 0.0–1.2)
Total Protein: 7 g/dL (ref 6.5–8.1)

## 2024-05-13 MED ORDER — DIPHENHYDRAMINE HCL 50 MG/ML IJ SOLN
50.0000 mg | Freq: Once | INTRAMUSCULAR | Status: AC
  Administered 2024-05-13: 50 mg via INTRAVENOUS
  Filled 2024-05-13: qty 1

## 2024-05-13 MED ORDER — SODIUM CHLORIDE 0.9 % IV SOLN
20.0000 mg | Freq: Once | INTRAVENOUS | Status: AC
  Administered 2024-05-13: 20 mg via INTRAVENOUS
  Filled 2024-05-13: qty 20

## 2024-05-13 MED ORDER — ACETAMINOPHEN 325 MG PO TABS
650.0000 mg | ORAL_TABLET | Freq: Once | ORAL | Status: AC
  Administered 2024-05-13: 650 mg via ORAL
  Filled 2024-05-13: qty 2

## 2024-05-13 MED ORDER — SODIUM CHLORIDE 0.9 % IV SOLN
INTRAVENOUS | Status: DC
Start: 2024-05-13 — End: 2024-05-13

## 2024-05-13 MED ORDER — SODIUM CHLORIDE 0.9 % IV SOLN
1000.0000 mg | Freq: Once | INTRAVENOUS | Status: AC
  Administered 2024-05-13: 1000 mg via INTRAVENOUS
  Filled 2024-05-13: qty 40

## 2024-05-13 MED ORDER — FAMOTIDINE IN NACL 20-0.9 MG/50ML-% IV SOLN
20.0000 mg | Freq: Once | INTRAVENOUS | Status: AC
  Administered 2024-05-13: 20 mg via INTRAVENOUS
  Filled 2024-05-13: qty 50

## 2024-05-13 NOTE — Patient Instructions (Signed)
 [facility] - A DEPT OF [facility]  Discharge Instructions: Thank you for choosing [facility] to provide your oncology and hematology care.   If you have a lab appointment with the Cancer Center, please go directly to the Cancer Center and check in at the registration area.   Wear comfortable clothing and clothing appropriate for easy access to any Portacath or PICC line.   We strive to give you quality time with your provider. You may need to reschedule your appointment if you arrive late (15 or more minutes).  Arriving late affects you and other patients whose appointments are after yours.  Also, if you miss three or more appointments without notifying the office, you may be dismissed from the clinic at the provider's discretion.      For prescription refill requests, have your pharmacy contact our office and allow 72 hours for refills to be completed.    Today you received the following chemotherapy and/or immunotherapy agents gazyva       To help prevent nausea and vomiting after your treatment, we encourage you to take your nausea medication as directed.  BELOW ARE SYMPTOMS THAT SHOULD BE REPORTED IMMEDIATELY: *FEVER GREATER THAN 100.4 F (38 C) OR HIGHER *CHILLS OR SWEATING *NAUSEA AND VOMITING THAT IS NOT CONTROLLED WITH YOUR NAUSEA MEDICATION *UNUSUAL SHORTNESS OF BREATH *UNUSUAL BRUISING OR BLEEDING *URINARY PROBLEMS (pain or burning when urinating, or frequent urination) *BOWEL PROBLEMS (unusual diarrhea, constipation, pain near the anus) TENDERNESS IN MOUTH AND THROAT WITH OR WITHOUT PRESENCE OF ULCERS (sore throat, sores in mouth, or a toothache) UNUSUAL RASH, SWELLING OR PAIN  UNUSUAL VAGINAL DISCHARGE OR ITCHING   Items with * indicate a potential emergency and should be followed up as soon as possible or go to the Emergency Department if any problems should occur.  Please show the CHEMOTHERAPY ALERT CARD or IMMUNOTHERAPY  ALERT CARD at check-in to the Emergency Department and triage nurse.  Should you have questions after your visit or need to cancel or reschedule your appointment, please contact [facility] - A DEPT OF [facility]  Dept: [phone]  and follow the prompts.  Office hours are 8:00 a.m. to 4:30 p.m. Monday - Friday. Please note that voicemails left after 4:00 p.m. may not be returned until the following business day.  We are closed weekends and major holidays. You have access to a nurse at all times for urgent questions. Please call the main number to the clinic Dept: [phone] and follow the prompts.   For any non-urgent questions, you may also contact your provider using MyChart. We now offer e-Visits for anyone 57 and older to request care online for non-urgent symptoms. For details visit [email or web address].   Also download the MyChart app! Go to the app store, search "MyChart", open the app, select [facility], and log in with your MyChart username and password.

## 2024-05-14 ENCOUNTER — Other Ambulatory Visit: Payer: Self-pay

## 2024-05-16 DIAGNOSIS — I5022 Chronic systolic (congestive) heart failure: Secondary | ICD-10-CM | POA: Diagnosis not present

## 2024-05-16 DIAGNOSIS — K7030 Alcoholic cirrhosis of liver without ascites: Secondary | ICD-10-CM | POA: Diagnosis not present

## 2024-05-16 DIAGNOSIS — I130 Hypertensive heart and chronic kidney disease with heart failure and stage 1 through stage 4 chronic kidney disease, or unspecified chronic kidney disease: Secondary | ICD-10-CM | POA: Diagnosis not present

## 2024-05-16 DIAGNOSIS — N1831 Chronic kidney disease, stage 3a: Secondary | ICD-10-CM | POA: Diagnosis not present

## 2024-05-16 DIAGNOSIS — M109 Gout, unspecified: Secondary | ICD-10-CM | POA: Diagnosis not present

## 2024-05-16 DIAGNOSIS — E1122 Type 2 diabetes mellitus with diabetic chronic kidney disease: Secondary | ICD-10-CM | POA: Diagnosis not present

## 2024-05-16 DIAGNOSIS — C9110 Chronic lymphocytic leukemia of B-cell type not having achieved remission: Secondary | ICD-10-CM | POA: Diagnosis not present

## 2024-05-17 ENCOUNTER — Encounter: Payer: Self-pay | Admitting: Hematology and Oncology

## 2024-06-09 ENCOUNTER — Encounter: Payer: Self-pay | Admitting: Hematology and Oncology

## 2024-06-09 MED FILL — Dexamethasone Sodium Phosphate Inj 100 MG/10ML: INTRAMUSCULAR | Qty: 2 | Status: AC

## 2024-06-10 ENCOUNTER — Encounter: Payer: Self-pay | Admitting: Hematology and Oncology

## 2024-06-10 ENCOUNTER — Inpatient Hospital Stay: Admitting: Physician Assistant

## 2024-06-10 ENCOUNTER — Inpatient Hospital Stay

## 2024-06-10 ENCOUNTER — Inpatient Hospital Stay: Attending: Hematology and Oncology

## 2024-06-10 VITALS — BP 150/79 | HR 73 | Temp 97.7°F | Resp 17 | Ht 72.0 in | Wt 204.0 lb

## 2024-06-10 VITALS — BP 163/79 | HR 85 | Temp 98.0°F | Resp 20

## 2024-06-10 DIAGNOSIS — C9110 Chronic lymphocytic leukemia of B-cell type not having achieved remission: Secondary | ICD-10-CM

## 2024-06-10 DIAGNOSIS — Z5112 Encounter for antineoplastic immunotherapy: Secondary | ICD-10-CM | POA: Diagnosis not present

## 2024-06-10 DIAGNOSIS — Z79899 Other long term (current) drug therapy: Secondary | ICD-10-CM | POA: Insufficient documentation

## 2024-06-10 DIAGNOSIS — D696 Thrombocytopenia, unspecified: Secondary | ICD-10-CM | POA: Diagnosis not present

## 2024-06-10 LAB — CMP (CANCER CENTER ONLY)
ALT: 20 U/L (ref 0–44)
AST: 22 U/L (ref 15–41)
Albumin: 4.3 g/dL (ref 3.5–5.0)
Alkaline Phosphatase: 79 U/L (ref 38–126)
Anion gap: 5 (ref 5–15)
BUN: 32 mg/dL — ABNORMAL HIGH (ref 8–23)
CO2: 31 mmol/L (ref 22–32)
Calcium: 9.5 mg/dL (ref 8.9–10.3)
Chloride: 103 mmol/L (ref 98–111)
Creatinine: 1.42 mg/dL — ABNORMAL HIGH (ref 0.61–1.24)
GFR, Estimated: 53 mL/min — ABNORMAL LOW (ref >60)
Glucose, Bld: 114 mg/dL — ABNORMAL HIGH (ref 70–99)
Potassium: 4 mmol/L (ref 3.5–5.1)
Sodium: 139 mmol/L (ref 135–145)
Total Bilirubin: 1.5 mg/dL — ABNORMAL HIGH (ref 0.0–1.2)
Total Protein: 7.1 g/dL (ref 6.5–8.1)

## 2024-06-10 LAB — CBC WITH DIFFERENTIAL (CANCER CENTER ONLY)
Abs Immature Granulocytes: 0.03 K/uL (ref 0.00–0.07)
Basophils Absolute: 0 K/uL (ref 0.0–0.1)
Basophils Relative: 1 %
Eosinophils Absolute: 0.2 K/uL (ref 0.0–0.5)
Eosinophils Relative: 4 %
HCT: 42.8 % (ref 39.0–52.0)
Hemoglobin: 13.4 g/dL (ref 13.0–17.0)
Immature Granulocytes: 1 %
Lymphocytes Relative: 35 %
Lymphs Abs: 1.5 K/uL (ref 0.7–4.0)
MCH: 26.3 pg (ref 26.0–34.0)
MCHC: 31.3 g/dL (ref 30.0–36.0)
MCV: 84.1 fL (ref 80.0–100.0)
Monocytes Absolute: 0.2 K/uL (ref 0.1–1.0)
Monocytes Relative: 4 %
Neutro Abs: 2.4 K/uL (ref 1.7–7.7)
Neutrophils Relative %: 55 %
Platelet Count: 90 K/uL — ABNORMAL LOW (ref 150–400)
RBC: 5.09 MIL/uL (ref 4.22–5.81)
RDW: 14.9 % (ref 11.5–15.5)
WBC Count: 4.3 K/uL (ref 4.0–10.5)
nRBC: 0 % (ref 0.0–0.2)

## 2024-06-10 MED ORDER — SODIUM CHLORIDE 0.9% FLUSH: 10.0000 mL | INTRAVENOUS | Status: DC | PRN

## 2024-06-10 MED ORDER — ACETAMINOPHEN 325 MG PO TABS
650.0000 mg | ORAL_TABLET | Freq: Once | ORAL | Status: AC
  Administered 2024-06-10: 650 mg via ORAL
  Filled 2024-06-10: qty 2

## 2024-06-10 MED ORDER — CARVEDILOL 6.25 MG PO TABS: 6.2500 mg | ORAL_TABLET | Freq: Two times a day (BID) | ORAL | Status: AC

## 2024-06-10 MED ORDER — SODIUM CHLORIDE 0.9 % IV SOLN: INTRAVENOUS | Status: DC

## 2024-06-10 MED ORDER — FAMOTIDINE IN NACL 20-0.9 MG/50ML-% IV SOLN
20.0000 mg | Freq: Once | INTRAVENOUS | Status: AC
  Administered 2024-06-10: 20 mg via INTRAVENOUS
  Filled 2024-06-10: qty 50

## 2024-06-10 MED ORDER — SODIUM CHLORIDE 0.9 % IV SOLN
1000.0000 mg | Freq: Once | INTRAVENOUS | Status: AC
  Administered 2024-06-10: 1000 mg via INTRAVENOUS
  Filled 2024-06-10: qty 40

## 2024-06-10 MED ORDER — SODIUM CHLORIDE 0.9 % IV SOLN
20.0000 mg | Freq: Once | INTRAVENOUS | Status: AC
  Administered 2024-06-10: 20 mg via INTRAVENOUS
  Filled 2024-06-10: qty 20

## 2024-06-10 MED ORDER — DIPHENHYDRAMINE HCL 50 MG/ML IJ SOLN
50.0000 mg | Freq: Once | INTRAMUSCULAR | Status: AC
  Administered 2024-06-10: 50 mg via INTRAVENOUS
  Filled 2024-06-10: qty 1

## 2024-06-10 NOTE — Patient Instructions (Addendum)
 [facility] - A DEPT OF [facility]  Discharge Instructions: Thank you for choosing [facility] to provide your oncology and hematology care.   If you have a lab appointment with the Cancer Center, please go directly to the Cancer Center and check in at the registration area.   Wear comfortable clothing and clothing appropriate for easy access to any Portacath or PICC line.   We strive to give you quality time with your provider. You may need to reschedule your appointment if you arrive late (15 or more minutes).  Arriving late affects you and other patients whose appointments are after yours.  Also, if you miss three or more appointments without notifying the office, you may be dismissed from the clinic at the provider's discretion.      For prescription refill requests, have your pharmacy contact our office and allow 72 hours for refills to be completed.    Today you received the following chemotherapy and/or immunotherapy agents gazyva      To help prevent nausea and vomiting after your treatment, we encourage you to take your nausea medication as directed.  BELOW ARE SYMPTOMS THAT SHOULD BE REPORTED IMMEDIATELY: *FEVER GREATER THAN 100.4 F (38 C) OR HIGHER *CHILLS OR SWEATING *NAUSEA AND VOMITING THAT IS NOT CONTROLLED WITH YOUR NAUSEA MEDICATION *UNUSUAL SHORTNESS OF BREATH *UNUSUAL BRUISING OR BLEEDING *URINARY PROBLEMS (pain or burning when urinating, or frequent urination) *BOWEL PROBLEMS (unusual diarrhea, constipation, pain near the anus) TENDERNESS IN MOUTH AND THROAT WITH OR WITHOUT PRESENCE OF ULCERS (sore throat, sores in mouth, or a toothache) UNUSUAL RASH, SWELLING OR PAIN  UNUSUAL VAGINAL DISCHARGE OR ITCHING   Items with * indicate a potential emergency and should be followed up as soon as possible or go to the Emergency Department if any problems should occur.  Please show the CHEMOTHERAPY ALERT CARD or IMMUNOTHERAPY  ALERT CARD at check-in to the Emergency Department and triage nurse.  Should you have questions after your visit or need to cancel or reschedule your appointment, please contact [facility] - A DEPT OF [facility]  Dept: [phone]  and follow the prompts.  Office hours are 8:00 a.m. to 4:30 p.m. Monday - Friday. Please note that voicemails left after 4:00 p.m. may not be returned until the following business day.  We are closed weekends and major holidays. You have access to a nurse at all times for urgent questions. Please call the main number to the clinic Dept: [phone] and follow the prompts.   For any non-urgent questions, you may also contact your provider using MyChart. We now offer e-Visits for anyone 59 and older to request care online for non-urgent symptoms. For details visit [email or web address].   Also download the MyChart app! Go to the app store, search "MyChart", open the app, select [facility], and log in with your MyChart username and password.  Obinutuzumab Injection What is this medication? OBINUTUZUMAB (OH bi nue TOOZ ue mab) treats leukemia and lymphoma. It works by blocking a protein that causes cancer cells to grow and multiply. This helps to slow or stop the spread of cancer cells. It is a monoclonal antibody. This medicine may be used for other purposes; ask your health care provider or pharmacist if you have questions. COMMON BRAND NAME(S): GAZYVA What should I tell my care team before I take this medication? They need to know if you have any of these  conditions: Heart disease Infection, especially a viral infection, such as hepatitis B Lung or breathing disease Take medications that treat or prevent blood clots An unusual or allergic reaction to obinutuzumab, other medications, foods, dyes, or preservatives Pregnant or trying to get pregnant Breastfeeding How should I use this medication? This medication is for infusion  into a vein. It is given by a care team in a hospital or clinic setting. Talk to your care team about the use of this medication in children. Special care may be needed. Overdosage: If you think you have taken too much of this medicine contact a poison control center or emergency room at once. NOTE: This medicine is only for you. Do not share this medicine with others. What if I miss a dose? Keep appointments for follow-up doses as directed. It is important not to miss your dose. Call your care team if you are unable to keep an appointment. What may interact with this medication? Live virus vaccines This list may not describe all possible interactions. Give your health care provider a list of all the medicines, herbs, non-prescription drugs, or dietary supplements you use. Also tell them if you smoke, drink alcohol, or use illegal drugs. Some items may interact with your medicine. What should I watch for while using this medication? Report any side effects that you notice during your treatment right away, such as changes in your breathing, fever, chills, dizziness or lightheadedness. These effects are more common with the first dose. Visit your care team for checks on your progress. You will need to have regular blood work. Report any other side effects. The side effects of this medication can continue after you finish your treatment. Continue your course of treatment even though you feel ill unless your care team tells you to stop. Call your care team for advice if you get a fever, chills or sore throat, or other symptoms of a cold or flu. Do not treat yourself. This medication decreases your body's ability to fight infections. Try to avoid being around people who are sick. This medication may increase your risk to bruise or bleed. Call your care team if you notice any unusual bleeding. Do not become pregnant while taking this medication or for 6 months after stopping it. Inform your care team if you  wish to become pregnant or think you might be pregnant. There is a potential for serious side effects to an unborn child. Talk to your care team or pharmacist for more information. Do not breast-feed an infant while taking this medication or for 6 months after stopping it. What side effects may I notice from receiving this medication? Side effects that you should report to your care team as soon as possible: Allergic reactions--skin rash, itching, hives, swelling of the face, lips, tongue, or throat Bleeding--bloody or black, tar-like stools, vomiting blood or brown material that looks like coffee grounds, red or dark brown urine, small red or purple spots on skin, unusual bruising or bleeding Blood clot--pain, swelling, or warmth in the leg, shortness of breath, chest pain Dizziness, loss of balance or coordination, confusion or trouble speaking Infection--fever, chills, cough, sore throat, wounds that don't heal, pain or trouble when passing urine, general feeling of discomfort or being unwell Infusion reactions--chest pain, shortness of breath or trouble breathing, feeling faint or lightheaded Liver injury--right upper belly pain, loss of appetite, nausea, light-colored stool, dark yellow or brown urine, yellowing skin or eyes, unusual weakness or fatigue Tumor lysis syndrome (TLS)--nausea, vomiting, diarrhea, decrease  in the amount of urine, dark urine, unusual weakness or fatigue, confusion, muscle pain or cramps, fast or irregular heartbeat, joint pain Side effects that usually do not require medical attention (report to your care team if they continue or are bothersome): Bone, joint, or muscle pain Constipation Diarrhea Fatigue Runny or stuffy nose Sore throat This list may not describe all possible side effects. Call your doctor for medical advice about side effects. You may report side effects to FDA at 1-800-FDA-1088. Where should I keep my medication? This medication is only given in a  hospital or clinic and will not be stored at home. NOTE: This sheet is a summary. It may not cover all possible information. If you have questions about this medicine, talk to your doctor, pharmacist, or health care provider.  2024 Elsevier/Gold Standard (2022-04-02 00:00:00)

## 2024-06-10 NOTE — Progress Notes (Signed)
 [name] Telephone:([phone]) [phone]   Fax:([phone]  PROGRESS NOTE  Patient Care Team: Leigh Lung, MD as PCP - General (Family Medicine) Federico Norleen Cody MADISON, MD as Consulting Physician (Hematology and Oncology)  Hematological/Oncological History # CLL --diagnosis of chronic lymphoid leukemia made 11/04/2018             (a) the cells are positive for CD 04/12/19/22/23, kappa restricted   (1) Rituximab  weekly x 9 weeks beginning 11/23/2018             (a) reaction to first dose (which was 100 mg only).   (2) on maintenance rituximab , first dose 03/07/2019, repeated every 3 months             (a) baseline PET scan obtained on 02/16/2020 was Deauville 2 except for the left iliac nodes, with SUV max 3.1             (b) rituximab  decreased to every 6 months beginning October 2021   (3) Switched to Zanubrutinib  160 mg PO BID in July 2024.   (4) due to progression transitioned to Obinutuzumab  x 8 cycles.   Interval History:  Cody [name] is a 68 year old male with Chronic Lymphocytic Leukemia (CLL) who presents for a follow-up visit while on zanubrutinib . The last visit was on 05/13/2024. In the interim since the last visit he is continued on his Obinutuzumab  therapy.   Cody [name] reports he continues to tolerate his treatment.  His energy levels are overall stable he can complete his daily activities on his own.  He denies any nausea, vomiting or abdominal pain.  His bowel habits are unchanged without any recurrent episodes of diarrhea or constipation.  He denies any palpable lumps, bumps or masses.  He denies easy bruising or signs of active bleeding.  He denies fevers, chills, night sweats, shortness of breath or chest pain or cough.  He has no other complaints.  Full 10 point ROS is otherwise negative.  He is willing and able to continue with obinutuzumab  therapy at this time.  MEDICAL HISTORY:  Past Medical History:  Diagnosis Date   Alcoholic cirrhosis (HCC)     Cataract    Chronic lymphoid leukemia (HCC)    Diabetes mellitus without complication (HCC)    High blood pressure 10/24/2004   History of alcohol abuse    Hypercholesterolemia     SURGICAL HISTORY: No past surgical history on file.  SOCIAL HISTORY: Social History   Socioeconomic History   Marital status: Divorced    Spouse name: Not on file   Number of children: Not on file   Years of education: Not on file   Highest education level: Not on file  Occupational History   Not on file  Tobacco Use   Smoking status: Never   Smokeless tobacco: Never  Substance and Sexual Activity   Alcohol use: Not Currently   Drug use: No   Sexual activity: Not on file  Other Topics Concern   Not on file  Social History Narrative   Not on file   Social Drivers of Health   Financial Resource Strain: Not on file  Food Insecurity: No Food Insecurity (10/01/2023)   Hunger Vital Sign    Worried About Running Out of Food in the Last Year: Never true    Ran Out of Food in the Last Year: Never true  Transportation Needs: No Transportation Needs (10/01/2023)   PRAPARE - Administrator, Civil Service (Medical): No  Lack of Transportation (Non-Medical): No  Physical Activity: Not on file  Stress: Not on file  Social Connections: Not on file  Intimate Partner Violence: Not At Risk (10/01/2023)   Humiliation, Afraid, Rape, and Kick questionnaire    Fear of Current or Ex-Partner: No    Emotionally Abused: No    Physically Abused: No    Sexually Abused: No    FAMILY HISTORY: Family History  Problem Relation Age of Onset   Alcohol abuse Father    Cirrhosis Father    Coronary artery disease Father     ALLERGIES:  is allergic to gazyva  [obinutuzumab ] and rituxan  [rituximab ].  MEDICATIONS:  Current Outpatient Medications  Medication Sig Dispense Refill   ACCU-CHEK GUIDE TEST test strip USE TO CHECK BLOOD SUGAR UP TO FOUR TIMES PER DAY.     allopurinol  (ZYLOPRIM ) 300 MG  tablet TAKE 1 TABLET DAILY (Patient taking differently: Take 300 mg by mouth in the morning.) 90 tablet 3   amLODipine  (NORVASC ) 10 MG tablet Take 10 mg by mouth at bedtime.     aspirin  EC 81 MG tablet Take 1 tablet (81 mg total) by mouth daily. Swallow whole. 30 tablet 12   atorvastatin  (LIPITOR) 20 MG tablet Take 1 tablet (20 mg total) by mouth at bedtime. 30 tablet 3   B-D UF III MINI PEN NEEDLES 31G X 5 MM MISC      Blood Glucose Monitoring Suppl (ACCU-CHEK GUIDE) w/Device KIT      dapagliflozin  propanediol (FARXIGA ) 10 MG TABS tablet Take 1 tablet (10 mg total) by mouth daily. 30 tablet 3   fexofenadine (ALLEGRA) 180 MG tablet Take 180 mg by mouth at bedtime.     hydrochlorothiazide (HYDRODIURIL) 25 MG tablet Take 25 mg by mouth daily.     losartan  (COZAAR ) 50 MG tablet Take 50 mg by mouth 2 (two) times daily.     metFORMIN  (GLUCOPHAGE -XR) 500 MG 24 hr tablet Take 1 tablet (500 mg total) by mouth 2 (two) times daily with a meal. Take 500 mg by mouth in the morning and 1,000 mg at bedtime (Patient taking differently: Take 500-1,000 mg by mouth See admin instructions. Take 500 mg by mouth in the morning and 1,000 mg at bedtime)     ondansetron  (ZOFRAN ) 8 MG tablet Take 1 tablet (8 mg total) by mouth every 8 (eight) hours as needed. 30 tablet 0   ONETOUCH DELICA LANCETS FINE MISC 3 (three) times daily.   5   prochlorperazine  (COMPAZINE ) 10 MG tablet Take 1 tablet (10 mg total) by mouth every 6 (six) hours as needed for nausea or vomiting. 30 tablet 0   TOUJEO  SOLOSTAR 300 UNIT/ML SOPN Inject 0-8 Units into the skin See admin instructions. Inject 0-8 units into the skin in the morning and at bedtime, PER SLIDING SCALE: BGL: 0-99 = give nothing; 100-150 = 4 units; 151-200 = 6 units; 201-250 = 8 units     carvedilol  (COREG ) 6.25 MG tablet Take 1 tablet (6.25 mg total) by mouth 2 (two) times daily.     No current facility-administered medications for this visit.    REVIEW OF SYSTEMS:    Constitutional: ( - ) fevers, ( - )  chills , ( - ) night sweats Eyes: ( - ) blurriness of vision, ( - ) double vision, ( - ) watery eyes Ears, nose, mouth, throat, and face: ( - ) mucositis, ( - ) sore throat Respiratory: ( - ) cough, ( - ) dyspnea, ( - )  wheezes Cardiovascular: ( - ) palpitation, ( - ) chest discomfort, ( - ) lower extremity swelling Gastrointestinal:  ( - ) nausea, ( - ) heartburn, ( - ) change in bowel habits Skin: ( - ) abnormal skin rashes Lymphatics: ( - ) new lymphadenopathy, ( - ) easy bruising Neurological: ( - ) numbness, ( - ) tingling, ( - ) new weaknesses Behavioral/Psych: ( - ) mood change, ( - ) new changes  All other systems were reviewed with the patient and are negative.  PHYSICAL EXAMINATION: ECOG PERFORMANCE STATUS: 1 - Symptomatic but completely ambulatory  Vitals:   06/10/24 1056 06/10/24 1058  BP: (!) 153/79 (!) 150/79  Pulse: 73   Resp: 17   Temp: 97.7 F (36.5 C)   SpO2: 100%       Filed Weights   06/10/24 1056  Weight: 204 lb (92.5 kg)       GENERAL: Well-appearing elderly male, alert, no distress and comfortable SKIN: skin color, texture, turgor are normal, no rashes or significant lesions EYES: conjunctiva are pink and non-injected, sclera clear LUNGS: clear to auscultation and percussion with normal breathing effort HEART: regular rate & rhythm and no murmurs and no lower extremity edema Musculoskeletal: no cyanosis of digits and no clubbing  PSYCH: alert & oriented x 3, fluent speech NEURO: no focal motor/sensory deficits  LABORATORY DATA:  I have reviewed the data as listed    Latest Ref Rng & Units 06/10/2024   10:42 AM 05/13/2024    8:04 AM 04/15/2024   10:01 AM  CBC  WBC 4.0 - 10.5 K/uL 4.3  4.3  3.9   Hemoglobin 13.0 - 17.0 g/dL 86.5  86.9  86.7   Hematocrit 39.0 - 52.0 % 42.8  41.1  41.5   Platelets 150 - 400 K/uL 90  102  101        Latest Ref Rng & Units 06/10/2024   10:42 AM 05/13/2024    8:04 AM  04/15/2024   10:01 AM  CMP  Glucose 70 - 99 mg/dL 885  867  890   BUN 8 - 23 mg/dL 32  37  29   Creatinine 0.61 - 1.24 mg/dL 8.57  8.73  8.79   Sodium 135 - 145 mmol/L 139  142  141   Potassium 3.5 - 5.1 mmol/L 4.0  4.3  3.9   Chloride 98 - 111 mmol/L 103  107  103   CO2 22 - 32 mmol/L 31  29  31    Calcium  8.9 - 10.3 mg/dL 9.5  9.4  9.2   Total Protein 6.5 - 8.1 g/dL 7.1  7.0  7.3   Total Bilirubin 0.0 - 1.2 mg/dL 1.5  0.8  1.0   Alkaline Phos 38 - 126 U/L 79  76  82   AST 15 - 41 U/L 22  22  24    ALT 0 - 44 U/L 20  23  22      RADIOGRAPHIC STUDIES: No results found.   ASSESSMENT & PLAN Cody [name] is a 68 y.o. male with medical history significant for CLL who presents for a follow up visit.   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 Discontinued due to poor tolerance with medication.  --started Cycle 1, Day 1 of 12/24/2023 of single agent Obinutuzumab .  PLAN --Due for Cycle 7, Day 1 of Obinutuzumab  today --Labs show white blood cell 4.3, Hgb  13.4, MCV 84.1, Plt 90. Creatinine mildly elevated to 1.42. LFTs normal.  --will proceed with obinutuzumab  monotherapy.  The patient is doing particular well on obinutuzumab  therapy alone, will HOLD on starting venetoclax . Discussed risks/benefits with patient and family.  --plan for a total of 8 cycles of Obinutuzumab   --Return to clinic for Cycle 8 Day 1 of treatment. (4 weeks)   #General Health Maintenance -Encourage continued physical activity (treadmill walking).  #Thrombocytopenia: -- Levels ranging between 90-100K on average. --No bleeding episodes reported at this time. -- Platelet count is 90K today, monitor for now   No orders of the defined types were placed in this encounter.   All questions were answered. The patient knows to call the clinic with any problems, questions or concerns.  I  have spent a total of 30 minutes minutes of face-to-face and non-face-to-face time, preparing to see the patient, performing a medically appropriate examination, counseling and educating the patient, documenting clinical information in the electronic health record,and care coordination.   Cody Police PA-C Dept of Hematology and Oncology [name] Phone: [phone]     06/10/2024 11:31 AM  Shermon CHRISTELLA Hearing BD, Rosemary BIRCH, Caligaris-Cappio F, Dighiero G, Dhner H, Hillmen P, [place], Montserrat E, Chiorazzi N, [place], Rai KR, [place], Eichhorst B, O'Brien S, Robak T, Seymour JF, [place] UG. iwCLL guidelines for diagnosis, indications for treatment, response assessment, and supportive management of CLL. Blood. 2018 Jun 21;131(25):2745-2760.  Active disease should be clearly documented to initiate therapy. At least 1 of the following criteria should be met.  1) Evidence of progressive marrow failure as manifested by the development of, or worsening of, anemia and/or thrombocytopenia. Cutoff levels of Hb <10 g/dL or platelet counts <899  109/L are generally regarded as indication for treatment. However, in some patients, platelet counts <100  109/L may remain stable over a long period; this situation does not automatically require therapeutic intervention. 2) Massive (ie, >=6 cm below the left costal margin) or progressive or symptomatic splenomegaly. 3) Massive nodes (ie, >=10 cm in longest diameter) or progressive or symptomatic lymphadenopathy. 4) Progressive lymphocytosis with an increase of >=50% over a 62-month period, or lymphocyte doubling time (LDT) <6 months. LDT can be obtained by linear regression extrapolation of absolute lymphocyte counts obtained at intervals of 2 weeks over an observation period of 2 to 3 months; patients with initial blood lymphocyte counts <30  109/L may require a longer observation period to determine the LDT.  Factors contributing to lymphocytosis other than CLL (eg, infections, steroid administration) should be excluded. 5) Autoimmune complications including anemia or thrombocytopenia poorly responsive to corticosteroids. 6) Symptomatic or functional extranodal involvement (eg, skin, kidney, lung, spine). Disease-related symptoms as defined by any of the following: Unintentional weight loss >=10% within the previous 6 months. Significant fatigue (ie, ECOG performance scale 2 or worse; cannot work or unable to perform usual activities). Fevers >=100.65F or 38.0C for 2 or more weeks without evidence of infection. Night sweats for >=1 month without evidence of infection.

## 2024-06-11 ENCOUNTER — Other Ambulatory Visit: Payer: Self-pay

## 2024-06-12 ENCOUNTER — Other Ambulatory Visit: Payer: Self-pay

## 2024-06-27 ENCOUNTER — Ambulatory Visit (INDEPENDENT_AMBULATORY_CARE_PROVIDER_SITE_OTHER): Admitting: Podiatry

## 2024-06-27 ENCOUNTER — Encounter: Payer: Self-pay | Admitting: Podiatry

## 2024-06-27 DIAGNOSIS — E1151 Type 2 diabetes mellitus with diabetic peripheral angiopathy without gangrene: Secondary | ICD-10-CM

## 2024-06-27 DIAGNOSIS — M79674 Pain in right toe(s): Secondary | ICD-10-CM | POA: Diagnosis not present

## 2024-06-27 DIAGNOSIS — B351 Tinea unguium: Secondary | ICD-10-CM | POA: Diagnosis not present

## 2024-06-27 DIAGNOSIS — M79675 Pain in left toe(s): Secondary | ICD-10-CM | POA: Diagnosis not present

## 2024-06-27 NOTE — Progress Notes (Signed)
 This patient presents to the office with chief complaint of long thick painful nails.  Patient says the nails are painful walking and wearing shoes.  This patient is unable to self treat.  This patient is unable to trim his nails since he is unable to reach his  nails.   he presents to the office for preventative foot care services.    General Appearance  Alert, conversant and in no acute stress.  Vascular  Dorsalis pedis and posterior tibial  pulses are palpable  bilaterally.  Capillary return is within normal limits  bilaterally. Temperature is within normal limits  bilaterally.  Neurologic  Senn-Weinstein monofilament wire test within normal limits  bilaterally. Muscle power within normal limits bilaterally.  Nails Thick disfigured discolored nails with subungual debris  from hallux to fifth toes bilaterally. No evidence of bacterial infection or drainage bilaterally.   Orthopedic  No limitations of motion  feet .  No crepitus or effusions noted.  No bony pathology .  Syndactaly 2-3 left foot.  Skin  normotropic skin with  noted bilaterally.  No signs of infections or ulcers noted.   Porokeratosis sub 2 right foot.  Onychomycosis  Nails  B/L.  Pain in right toes  Pain in left toes   Debridement of nails both feet followed trimming the nails with dremel tool.  Debridement of callus with # 15 blade and dremel tool.    RTC  3  months.   Helane Gunther DPM

## 2024-06-28 ENCOUNTER — Other Ambulatory Visit: Payer: Self-pay

## 2024-07-08 ENCOUNTER — Inpatient Hospital Stay: Admitting: Hematology and Oncology

## 2024-07-08 ENCOUNTER — Inpatient Hospital Stay: Attending: Hematology and Oncology

## 2024-07-08 ENCOUNTER — Inpatient Hospital Stay

## 2024-07-08 VITALS — BP 120/64 | HR 59 | Temp 97.8°F | Resp 17 | Wt 211.4 lb

## 2024-07-08 VITALS — BP 131/64 | HR 72 | Temp 97.6°F | Resp 18

## 2024-07-08 DIAGNOSIS — Z5112 Encounter for antineoplastic immunotherapy: Secondary | ICD-10-CM | POA: Diagnosis not present

## 2024-07-08 DIAGNOSIS — C9110 Chronic lymphocytic leukemia of B-cell type not having achieved remission: Secondary | ICD-10-CM | POA: Diagnosis not present

## 2024-07-08 DIAGNOSIS — Z79899 Other long term (current) drug therapy: Secondary | ICD-10-CM | POA: Diagnosis not present

## 2024-07-08 DIAGNOSIS — D696 Thrombocytopenia, unspecified: Secondary | ICD-10-CM

## 2024-07-08 LAB — CBC WITH DIFFERENTIAL (CANCER CENTER ONLY)
Abs Immature Granulocytes: 0.01 K/uL (ref 0.00–0.07)
Basophils Absolute: 0 K/uL (ref 0.0–0.1)
Basophils Relative: 1 %
Eosinophils Absolute: 0.2 K/uL (ref 0.0–0.5)
Eosinophils Relative: 4 %
HCT: 41.3 % (ref 39.0–52.0)
Hemoglobin: 13.3 g/dL (ref 13.0–17.0)
Immature Granulocytes: 0 %
Lymphocytes Relative: 39 %
Lymphs Abs: 1.7 K/uL (ref 0.7–4.0)
MCH: 26.8 pg (ref 26.0–34.0)
MCHC: 32.2 g/dL (ref 30.0–36.0)
MCV: 83.3 fL (ref 80.0–100.0)
Monocytes Absolute: 0.2 K/uL (ref 0.1–1.0)
Monocytes Relative: 5 %
Neutro Abs: 2.2 K/uL (ref 1.7–7.7)
Neutrophils Relative %: 51 %
Platelet Count: 82 K/uL — ABNORMAL LOW (ref 150–400)
RBC: 4.96 MIL/uL (ref 4.22–5.81)
RDW: 14.4 % (ref 11.5–15.5)
WBC Count: 4.3 K/uL (ref 4.0–10.5)
nRBC: 0 % (ref 0.0–0.2)

## 2024-07-08 LAB — CMP (CANCER CENTER ONLY)
ALT: 20 U/L (ref 0–44)
AST: 21 U/L (ref 15–41)
Albumin: 4.5 g/dL (ref 3.5–5.0)
Alkaline Phosphatase: 81 U/L (ref 38–126)
Anion gap: 8 (ref 5–15)
BUN: 32 mg/dL — ABNORMAL HIGH (ref 8–23)
CO2: 27 mmol/L (ref 22–32)
Calcium: 8.9 mg/dL (ref 8.9–10.3)
Chloride: 104 mmol/L (ref 98–111)
Creatinine: 1.66 mg/dL — ABNORMAL HIGH (ref 0.61–1.24)
GFR, Estimated: 44 mL/min — ABNORMAL LOW (ref >60)
Glucose, Bld: 109 mg/dL — ABNORMAL HIGH (ref 70–99)
Potassium: 4.4 mmol/L (ref 3.5–5.1)
Sodium: 139 mmol/L (ref 135–145)
Total Bilirubin: 0.7 mg/dL (ref 0.0–1.2)
Total Protein: 6.9 g/dL (ref 6.5–8.1)

## 2024-07-08 MED ORDER — SODIUM CHLORIDE 0.9 % IV SOLN
1000.0000 mg | Freq: Once | INTRAVENOUS | Status: AC
  Administered 2024-07-08: 1000 mg via INTRAVENOUS
  Filled 2024-07-08: qty 40

## 2024-07-08 MED ORDER — ACETAMINOPHEN 325 MG PO TABS
650.0000 mg | ORAL_TABLET | Freq: Once | ORAL | Status: AC
  Administered 2024-07-08: 650 mg via ORAL
  Filled 2024-07-08: qty 2

## 2024-07-08 MED ORDER — SODIUM CHLORIDE 0.9 % IV SOLN: INTRAVENOUS | Status: DC

## 2024-07-08 MED ORDER — DEXAMETHASONE SODIUM PHOSPHATE 100 MG/10ML IJ SOLN
20.0000 mg | Freq: Once | INTRAMUSCULAR | Status: AC
  Administered 2024-07-08: 20 mg via INTRAVENOUS
  Filled 2024-07-08: qty 20

## 2024-07-08 MED ORDER — DIPHENHYDRAMINE HCL 50 MG/ML IJ SOLN
50.0000 mg | Freq: Once | INTRAMUSCULAR | Status: AC
  Administered 2024-07-08: 50 mg via INTRAVENOUS
  Filled 2024-07-08: qty 1

## 2024-07-08 MED ORDER — FAMOTIDINE IN NACL 20-0.9 MG/50ML-% IV SOLN
20.0000 mg | Freq: Once | INTRAVENOUS | Status: AC
  Administered 2024-07-08: 20 mg via INTRAVENOUS
  Filled 2024-07-08: qty 50

## 2024-07-08 NOTE — Patient Instructions (Signed)
 [facility] - A DEPT OF [facility]  Discharge Instructions: Thank you for choosing [facility] to provide your oncology and hematology care.   If you have a lab appointment with the Cancer Center, please go directly to the Cancer Center and check in at the registration area.   Wear comfortable clothing and clothing appropriate for easy access to any Portacath or PICC line.   We strive to give you quality time with your provider. You may need to reschedule your appointment if you arrive late (15 or more minutes).  Arriving late affects you and other patients whose appointments are after yours.  Also, if you miss three or more appointments without notifying the office, you may be dismissed from the clinic at the provider's discretion.      For prescription refill requests, have your pharmacy contact our office and allow 72 hours for refills to be completed.    Today you received the following chemotherapy and/or immunotherapy agents gazyva      To help prevent nausea and vomiting after your treatment, we encourage you to take your nausea medication as directed.  BELOW ARE SYMPTOMS THAT SHOULD BE REPORTED IMMEDIATELY: *FEVER GREATER THAN 100.4 F (38 C) OR HIGHER *CHILLS OR SWEATING *NAUSEA AND VOMITING THAT IS NOT CONTROLLED WITH YOUR NAUSEA MEDICATION *UNUSUAL SHORTNESS OF BREATH *UNUSUAL BRUISING OR BLEEDING *URINARY PROBLEMS (pain or burning when urinating, or frequent urination) *BOWEL PROBLEMS (unusual diarrhea, constipation, pain near the anus) TENDERNESS IN MOUTH AND THROAT WITH OR WITHOUT PRESENCE OF ULCERS (sore throat, sores in mouth, or a toothache) UNUSUAL RASH, SWELLING OR PAIN  UNUSUAL VAGINAL DISCHARGE OR ITCHING   Items with * indicate a potential emergency and should be followed up as soon as possible or go to the Emergency Department if any problems should occur.  Please show the CHEMOTHERAPY ALERT CARD or IMMUNOTHERAPY  ALERT CARD at check-in to the Emergency Department and triage nurse.  Should you have questions after your visit or need to cancel or reschedule your appointment, please contact [facility] - A DEPT OF [facility]  Dept: [phone]  and follow the prompts.  Office hours are 8:00 a.m. to 4:30 p.m. Monday - Friday. Please note that voicemails left after 4:00 p.m. may not be returned until the following business day.  We are closed weekends and major holidays. You have access to a nurse at all times for urgent questions. Please call the main number to the clinic Dept: [phone] and follow the prompts.   For any non-urgent questions, you may also contact your provider using MyChart. We now offer e-Visits for anyone 59 and older to request care online for non-urgent symptoms. For details visit [email or web address].   Also download the MyChart app! Go to the app store, search "MyChart", open the app, select [facility], and log in with your MyChart username and password.  Obinutuzumab Injection What is this medication? OBINUTUZUMAB (OH bi nue TOOZ ue mab) treats leukemia and lymphoma. It works by blocking a protein that causes cancer cells to grow and multiply. This helps to slow or stop the spread of cancer cells. It is a monoclonal antibody. This medicine may be used for other purposes; ask your health care provider or pharmacist if you have questions. COMMON BRAND NAME(S): GAZYVA What should I tell my care team before I take this medication? They need to know if you have any of these  conditions: Heart disease Infection, especially a viral infection, such as hepatitis B Lung or breathing disease Take medications that treat or prevent blood clots An unusual or allergic reaction to obinutuzumab, other medications, foods, dyes, or preservatives Pregnant or trying to get pregnant Breastfeeding How should I use this medication? This medication is for infusion  into a vein. It is given by a care team in a hospital or clinic setting. Talk to your care team about the use of this medication in children. Special care may be needed. Overdosage: If you think you have taken too much of this medicine contact a poison control center or emergency room at once. NOTE: This medicine is only for you. Do not share this medicine with others. What if I miss a dose? Keep appointments for follow-up doses as directed. It is important not to miss your dose. Call your care team if you are unable to keep an appointment. What may interact with this medication? Live virus vaccines This list may not describe all possible interactions. Give your health care provider a list of all the medicines, herbs, non-prescription drugs, or dietary supplements you use. Also tell them if you smoke, drink alcohol, or use illegal drugs. Some items may interact with your medicine. What should I watch for while using this medication? Report any side effects that you notice during your treatment right away, such as changes in your breathing, fever, chills, dizziness or lightheadedness. These effects are more common with the first dose. Visit your care team for checks on your progress. You will need to have regular blood work. Report any other side effects. The side effects of this medication can continue after you finish your treatment. Continue your course of treatment even though you feel ill unless your care team tells you to stop. Call your care team for advice if you get a fever, chills or sore throat, or other symptoms of a cold or flu. Do not treat yourself. This medication decreases your body's ability to fight infections. Try to avoid being around people who are sick. This medication may increase your risk to bruise or bleed. Call your care team if you notice any unusual bleeding. Do not become pregnant while taking this medication or for 6 months after stopping it. Inform your care team if you  wish to become pregnant or think you might be pregnant. There is a potential for serious side effects to an unborn child. Talk to your care team or pharmacist for more information. Do not breast-feed an infant while taking this medication or for 6 months after stopping it. What side effects may I notice from receiving this medication? Side effects that you should report to your care team as soon as possible: Allergic reactions--skin rash, itching, hives, swelling of the face, lips, tongue, or throat Bleeding--bloody or black, tar-like stools, vomiting blood or brown material that looks like coffee grounds, red or dark brown urine, small red or purple spots on skin, unusual bruising or bleeding Blood clot--pain, swelling, or warmth in the leg, shortness of breath, chest pain Dizziness, loss of balance or coordination, confusion or trouble speaking Infection--fever, chills, cough, sore throat, wounds that don't heal, pain or trouble when passing urine, general feeling of discomfort or being unwell Infusion reactions--chest pain, shortness of breath or trouble breathing, feeling faint or lightheaded Liver injury--right upper belly pain, loss of appetite, nausea, light-colored stool, dark yellow or brown urine, yellowing skin or eyes, unusual weakness or fatigue Tumor lysis syndrome (TLS)--nausea, vomiting, diarrhea, decrease  in the amount of urine, dark urine, unusual weakness or fatigue, confusion, muscle pain or cramps, fast or irregular heartbeat, joint pain Side effects that usually do not require medical attention (report to your care team if they continue or are bothersome): Bone, joint, or muscle pain Constipation Diarrhea Fatigue Runny or stuffy nose Sore throat This list may not describe all possible side effects. Call your doctor for medical advice about side effects. You may report side effects to FDA at 1-800-FDA-1088. Where should I keep my medication? This medication is only given in a  hospital or clinic and will not be stored at home. NOTE: This sheet is a summary. It may not cover all possible information. If you have questions about this medicine, talk to your doctor, pharmacist, or health care provider.  2024 Elsevier/Gold Standard (2022-04-02 00:00:00)

## 2024-07-08 NOTE — Progress Notes (Signed)
 [name] Telephone:([phone]) [phone]   Fax:([phone]) [phone]  PROGRESS NOTE  Patient Care Team: Leigh Lung, MD as PCP - General (Family Medicine) Cody Norleen ONEIDA MADISON, MD as Consulting Physician (Hematology and Oncology)  Hematological/Oncological History # CLL --diagnosis of chronic lymphoid leukemia made 11/04/2018             (a) the cells are positive for CD 04/12/19/22/23, kappa restricted   (1) Rituximab weekly x 9 weeks beginning 11/23/2018             (a) reaction to first dose (which was 100 mg only).   (2) on maintenance rituximab, first dose 03/07/2019, repeated every 3 months             (a) baseline PET scan obtained on 02/16/2020 was Deauville 2 except for the left iliac nodes, with SUV max 3.1             (b) rituximab decreased to every 6 months beginning October 2021   (3) Switched to Zanubrutinib 160 mg PO BID in July 2024.   (4) due to progression transitioned to Obinutuzumab x 8 cycles.   Interval History:  [name] is a 68 year old male with Chronic Lymphocytic Leukemia (CLL) who presents for a follow-up visit while on obinutuzumab. The last visit was on 06/10/2024. In the interim since the last visit he has continued on his Obinutuzumab therapy.   Mr. Fellman reports he has been well overall in the room since her last visit.  When asking how he is doing he reports he is I am here.  He notes that he tolerated his last infusion well with no major side effects.  He reports his energy levels are good and he is not having any lightheadedness, dizziness, or shortness of breath.  He does spend most of his day in the chair watching television.  He has been eating well and drinking plenty water.  Overall he is willing and able to continue with obinutuzumab therapy at this time.  He is tolerating it without any major side effects.  Full 10 point ROS is otherwise negative.  MEDICAL HISTORY:  Past Medical History:  Diagnosis Date   Alcoholic cirrhosis  (HCC)    Cataract    Chronic lymphoid leukemia (HCC)    Diabetes mellitus without complication (HCC)    High blood pressure 10/24/2004   History of alcohol abuse    Hypercholesterolemia     SURGICAL HISTORY: No past surgical history on file.  SOCIAL HISTORY: Social History   Socioeconomic History   Marital status: Divorced    Spouse name: Not on file   Number of children: Not on file   Years of education: Not on file   Highest education level: Not on file  Occupational History   Not on file  Tobacco Use   Smoking status: Never   Smokeless tobacco: Never  Substance and Sexual Activity   Alcohol use: Not Currently   Drug use: No   Sexual activity: Not on file  Other Topics Concern   Not on file  Social History Narrative   Not on file   Social Drivers of Health   Financial Resource Strain: Not on file  Food Insecurity: No Food Insecurity (07/08/2024)   Hunger Vital Sign    Worried About Running Out of Food in the Last Year: Never true    Ran Out of Food in the Last Year: Never true  Transportation Needs: No Transportation Needs (07/08/2024)   PRAPARE -  Administrator, Civil Service (Medical): No    Lack of Transportation (Non-Medical): No  Physical Activity: Not on file  Stress: Not on file  Social Connections: Not on file  Intimate Partner Violence: Not At Risk (07/08/2024)   Humiliation, Afraid, Rape, and Kick questionnaire    Fear of Current or Ex-Partner: No    Emotionally Abused: No    Physically Abused: No    Sexually Abused: No    FAMILY HISTORY: Family History  Problem Relation Age of Onset   Alcohol abuse Father    Cirrhosis Father    Coronary artery disease Father     ALLERGIES:  is allergic to gazyva [obinutuzumab] and rituxan [rituximab].  MEDICATIONS:  Current Outpatient Medications  Medication Sig Dispense Refill   ACCU-CHEK GUIDE TEST test strip USE TO CHECK BLOOD SUGAR UP TO FOUR TIMES PER DAY.     allopurinol (ZYLOPRIM) 300  MG tablet TAKE 1 TABLET DAILY (Patient taking differently: Take 300 mg by mouth in the morning.) 90 tablet 3   amLODipine (NORVASC) 10 MG tablet Take 10 mg by mouth at bedtime.     aspirin EC 81 MG tablet Take 1 tablet (81 mg total) by mouth daily. Swallow whole. 30 tablet 12   atorvastatin (LIPITOR) 20 MG tablet Take 1 tablet (20 mg total) by mouth at bedtime. 30 tablet 3   B-D UF III MINI PEN NEEDLES 31G X 5 MM MISC      Blood Glucose Monitoring Suppl (ACCU-CHEK GUIDE) w/Device KIT      carvedilol (COREG) 6.25 MG tablet Take 1 tablet (6.25 mg total) by mouth 2 (two) times daily.     dapagliflozin propanediol (FARXIGA) 10 MG TABS tablet Take 1 tablet (10 mg total) by mouth daily. 30 tablet 3   fexofenadine (ALLEGRA) 180 MG tablet Take 180 mg by mouth at bedtime.     hydrochlorothiazide (HYDRODIURIL) 25 MG tablet Take 25 mg by mouth daily.     losartan (COZAAR) 50 MG tablet Take 50 mg by mouth 2 (two) times daily.     metFORMIN (GLUCOPHAGE-XR) 500 MG 24 hr tablet Take 1 tablet (500 mg total) by mouth 2 (two) times daily with a meal. Take 500 mg by mouth in the morning and 1,000 mg at bedtime (Patient taking differently: Take 500-1,000 mg by mouth See admin instructions. Take 500 mg by mouth in the morning and 1,000 mg at bedtime)     ondansetron (ZOFRAN) 8 MG tablet Take 1 tablet (8 mg total) by mouth every 8 (eight) hours as needed. 30 tablet 0   ONETOUCH DELICA LANCETS FINE MISC 3 (three) times daily.   5   prochlorperazine (COMPAZINE) 10 MG tablet Take 1 tablet (10 mg total) by mouth every 6 (six) hours as needed for nausea or vomiting. 30 tablet 0   TOUJEO SOLOSTAR 300 UNIT/ML SOPN Inject 0-8 Units into the skin See admin instructions. Inject 0-8 units into the skin in the morning and at bedtime, PER SLIDING SCALE: BGL: 0-99 = give nothing; 100-150 = 4 units; 151-200 = 6 units; 201-250 = 8 units     No current facility-administered medications for this visit.    REVIEW OF SYSTEMS:    Constitutional: ( - ) fevers, ( - )  chills , ( - ) night sweats Eyes: ( - ) blurriness of vision, ( - ) double vision, ( - ) watery eyes Ears, nose, mouth, throat, and face: ( - ) mucositis, ( - ) sore throat  Respiratory: ( - ) cough, ( - ) dyspnea, ( - ) wheezes Cardiovascular: ( - ) palpitation, ( - ) chest discomfort, ( - ) lower extremity swelling Gastrointestinal:  ( - ) nausea, ( - ) heartburn, ( - ) change in bowel habits Skin: ( - ) abnormal skin rashes Lymphatics: ( - ) new lymphadenopathy, ( - ) easy bruising Neurological: ( - ) numbness, ( - ) tingling, ( - ) new weaknesses Behavioral/Psych: ( - ) mood change, ( - ) new changes  All other systems were reviewed with the patient and are negative.  PHYSICAL EXAMINATION: ECOG PERFORMANCE STATUS: 1 - Symptomatic but completely ambulatory  Vitals:   07/08/24 1056  BP: 120/64  Pulse: (!) 59  Resp: 17  Temp: 97.8 F (36.6 C)  SpO2: 98%       Filed Weights   07/08/24 1056  Weight: 211 lb 6.4 oz (95.9 kg)        GENERAL: Well-appearing elderly male, alert, no distress and comfortable SKIN: skin color, texture, turgor are normal, no rashes or significant lesions EYES: conjunctiva are pink and non-injected, sclera clear LUNGS: clear to auscultation and percussion with normal breathing effort HEART: regular rate & rhythm and no murmurs and no lower extremity edema Musculoskeletal: no cyanosis of digits and no clubbing  PSYCH: alert & oriented x 3, fluent speech NEURO: no focal motor/sensory deficits  LABORATORY DATA:  I have reviewed the data as listed    Latest Ref Rng & Units 07/08/2024   10:25 AM 06/10/2024   10:42 AM 05/13/2024    8:04 AM  CBC  WBC 4.0 - 10.5 K/uL 4.3  4.3  4.3   Hemoglobin 13.0 - 17.0 g/dL 86.6  86.5  86.9   Hematocrit 39.0 - 52.0 % 41.3  42.8  41.1   Platelets 150 - 400 K/uL 82  90  102        Latest Ref Rng & Units 07/08/2024   10:25 AM 06/10/2024   10:42 AM 05/13/2024    8:04 AM   CMP  Glucose 70 - 99 mg/dL 890  885  867   BUN 8 - 23 mg/dL 32  32  37   Creatinine 0.61 - 1.24 mg/dL 8.33  8.57  8.73   Sodium 135 - 145 mmol/L 139  139  142   Potassium 3.5 - 5.1 mmol/L 4.4  4.0  4.3   Chloride 98 - 111 mmol/L 104  103  107   CO2 22 - 32 mmol/L 27  31  29    Calcium 8.9 - 10.3 mg/dL 8.9  9.5  9.4   Total Protein 6.5 - 8.1 g/dL 6.9  7.1  7.0   Total Bilirubin 0.0 - 1.2 mg/dL 0.7  1.5  0.8   Alkaline Phos 38 - 126 U/L 81  79  76   AST 15 - 41 U/L 21  22  22    ALT 0 - 44 U/L 20  20  23      RADIOGRAPHIC STUDIES: No results found.   ASSESSMENT & PLAN PERLIE SCHEURING is a 68 y.o. male with medical history significant for CLL who presents for a follow up visit.   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 Discontinued due to poor tolerance with medication.  --started Cycle 1, Day 1 of 12/24/2023 of single agent Obinutuzumab.  PLAN --Due for Cycle 8, Day 1 of Obinutuzumab  today --Labs show white blood cell 4.3, Hgb 13.3, MCV 83.3, Plt 82. Creatinine mildly elevated to 1.66. LFTs normal.  --will proceed with obinutuzumab monotherapy.  The patient is doing particular well on obinutuzumab therapy alone, will HOLD on starting venetoclax. Discussed risks/benefits with patient and family.  --plan for a total of 8 cycles of Obinutuzumab. Last treatment today.  --Return to clinic to reassess in 8 weeks time.   #General Health Maintenance -Encourage continued physical activity (treadmill walking).  #Thrombocytopenia: -- Levels ranging between 90-100K on average. --No bleeding episodes reported at this time. -- Platelet count is 82 today, monitor for now   No orders of the defined types were placed in this encounter.   All questions were answered. The patient knows to call the clinic with any problems, questions or concerns.  I have spent a  total of 30 minutes minutes of face-to-face and non-face-to-face time, preparing to see the patient, performing a medically appropriate examination, counseling and educating the patient, documenting clinical information in the electronic health record,and care coordination   07/08/2024 11:14 AM  Shermon CHRISTELLA Hearing BD, Catovsky D, Caligaris-Cappio F, Dighiero G, Dhner H, Hillmen P, Keating M, Montserrat E, Chiorazzi N, Stilgenbauer S, Rai KR, Coleridge, [address], O'Brien S, Robak T, Seymour JF, Kipps TJ. iwCLL guidelines for diagnosis, indications for treatment, response assessment, and supportive management of CLL. Blood. 2018 Jun 21;131(25):2745-2760.  Active disease should be clearly documented to initiate therapy. At least 1 of the following criteria should be met.  1) Evidence of progressive marrow failure as manifested by the development of, or worsening of, anemia and/or thrombocytopenia. Cutoff levels of Hb <10 g/dL or platelet counts <899  109/L are generally regarded as indication for treatment. However, in some patients, platelet counts <100  109/L may remain stable over a long period; this situation does not automatically require therapeutic intervention. 2) Massive (ie, >=6 cm below the left costal margin) or progressive or symptomatic splenomegaly. 3) Massive nodes (ie, >=10 cm in longest diameter) or progressive or symptomatic lymphadenopathy. 4) Progressive lymphocytosis with an increase of >=50% over a 58-month period, or lymphocyte doubling time (LDT) <6 months. LDT can be obtained by linear regression extrapolation of absolute lymphocyte counts obtained at intervals of 2 weeks over an observation period of 2 to 3 months; patients with initial blood lymphocyte counts <30  109/L may require a longer observation period to determine the LDT. Factors contributing to lymphocytosis other than CLL (eg, infections, steroid administration) should be excluded. 5) Autoimmune complications  including anemia or thrombocytopenia poorly responsive to corticosteroids. 6) Symptomatic or functional extranodal involvement (eg, skin, kidney, lung, spine). Disease-related symptoms as defined by any of the following: Unintentional weight loss >=10% within the previous 6 months. Significant fatigue (ie, ECOG performance scale 2 or worse; cannot work or unable to perform usual activities). Fevers >=100.26F or 38.0C for 2 or more weeks without evidence of infection. Night sweats for >=1 month without evidence of infection.

## 2024-07-10 ENCOUNTER — Encounter: Payer: Self-pay | Admitting: Hematology and Oncology

## 2024-08-05 ENCOUNTER — Inpatient Hospital Stay

## 2024-08-05 ENCOUNTER — Inpatient Hospital Stay: Admitting: Physician Assistant

## 2024-08-08 DIAGNOSIS — M109 Gout, unspecified: Secondary | ICD-10-CM | POA: Diagnosis not present

## 2024-08-08 DIAGNOSIS — I10 Essential (primary) hypertension: Secondary | ICD-10-CM | POA: Diagnosis not present

## 2024-08-08 DIAGNOSIS — N1831 Chronic kidney disease, stage 3a: Secondary | ICD-10-CM | POA: Diagnosis not present

## 2024-08-08 DIAGNOSIS — E1122 Type 2 diabetes mellitus with diabetic chronic kidney disease: Secondary | ICD-10-CM | POA: Diagnosis not present

## 2024-08-08 DIAGNOSIS — I5022 Chronic systolic (congestive) heart failure: Secondary | ICD-10-CM | POA: Diagnosis not present

## 2024-08-08 DIAGNOSIS — I130 Hypertensive heart and chronic kidney disease with heart failure and stage 1 through stage 4 chronic kidney disease, or unspecified chronic kidney disease: Secondary | ICD-10-CM | POA: Diagnosis not present

## 2024-08-08 DIAGNOSIS — C9110 Chronic lymphocytic leukemia of B-cell type not having achieved remission: Secondary | ICD-10-CM | POA: Diagnosis not present

## 2024-08-08 DIAGNOSIS — K7030 Alcoholic cirrhosis of liver without ascites: Secondary | ICD-10-CM | POA: Diagnosis not present

## 2024-09-06 ENCOUNTER — Other Ambulatory Visit: Payer: Self-pay | Admitting: Physician Assistant

## 2024-09-06 ENCOUNTER — Encounter: Payer: Self-pay | Admitting: Hematology and Oncology

## 2024-09-06 DIAGNOSIS — C9110 Chronic lymphocytic leukemia of B-cell type not having achieved remission: Secondary | ICD-10-CM

## 2024-09-07 ENCOUNTER — Inpatient Hospital Stay: Admitting: Physician Assistant

## 2024-09-07 ENCOUNTER — Inpatient Hospital Stay: Attending: Hematology and Oncology

## 2024-09-07 VITALS — BP 123/66 | HR 61 | Temp 97.3°F | Resp 18 | Wt 204.6 lb

## 2024-09-07 DIAGNOSIS — Z79899 Other long term (current) drug therapy: Secondary | ICD-10-CM | POA: Diagnosis not present

## 2024-09-07 DIAGNOSIS — C9110 Chronic lymphocytic leukemia of B-cell type not having achieved remission: Secondary | ICD-10-CM | POA: Insufficient documentation

## 2024-09-07 DIAGNOSIS — D696 Thrombocytopenia, unspecified: Secondary | ICD-10-CM | POA: Diagnosis not present

## 2024-09-07 LAB — CBC WITH DIFFERENTIAL (CANCER CENTER ONLY)
Abs Immature Granulocytes: 0.07 K/uL (ref 0.00–0.07)
Basophils Absolute: 0 K/uL (ref 0.0–0.1)
Basophils Relative: 1 %
Eosinophils Absolute: 0.4 K/uL (ref 0.0–0.5)
Eosinophils Relative: 6 %
HCT: 39.5 % (ref 39.0–52.0)
Hemoglobin: 12.8 g/dL — ABNORMAL LOW (ref 13.0–17.0)
Immature Granulocytes: 1 %
Lymphocytes Relative: 36 %
Lymphs Abs: 2.3 K/uL (ref 0.7–4.0)
MCH: 27.2 pg (ref 26.0–34.0)
MCHC: 32.4 g/dL (ref 30.0–36.0)
MCV: 83.9 fL (ref 80.0–100.0)
Monocytes Absolute: 0.3 K/uL (ref 0.1–1.0)
Monocytes Relative: 4 %
Neutro Abs: 3.2 K/uL (ref 1.7–7.7)
Neutrophils Relative %: 52 %
Platelet Count: 131 K/uL — ABNORMAL LOW (ref 150–400)
RBC: 4.71 MIL/uL (ref 4.22–5.81)
RDW: 15.3 % (ref 11.5–15.5)
WBC Count: 6.2 K/uL (ref 4.0–10.5)
nRBC: 0 % (ref 0.0–0.2)

## 2024-09-07 LAB — CMP (CANCER CENTER ONLY)
ALT: 27 U/L (ref 0–44)
AST: 21 U/L (ref 15–41)
Albumin: 4.5 g/dL (ref 3.5–5.0)
Alkaline Phosphatase: 84 U/L (ref 38–126)
Anion gap: 7 (ref 5–15)
BUN: 38 mg/dL — ABNORMAL HIGH (ref 8–23)
CO2: 27 mmol/L (ref 22–32)
Calcium: 9.8 mg/dL (ref 8.9–10.3)
Chloride: 108 mmol/L (ref 98–111)
Creatinine: 1.49 mg/dL — ABNORMAL HIGH (ref 0.61–1.24)
GFR, Estimated: 50 mL/min — ABNORMAL LOW (ref >60)
Glucose, Bld: 132 mg/dL — ABNORMAL HIGH (ref 70–99)
Potassium: 4.4 mmol/L (ref 3.5–5.1)
Sodium: 142 mmol/L (ref 135–145)
Total Bilirubin: 1 mg/dL (ref 0.0–1.2)
Total Protein: 6.8 g/dL (ref 6.5–8.1)

## 2024-09-07 LAB — LACTATE DEHYDROGENASE: LDH: 106 U/L (ref 98–192)

## 2024-09-08 ENCOUNTER — Encounter: Payer: Self-pay | Admitting: Hematology and Oncology

## 2024-09-08 NOTE — Progress Notes (Signed)
 [facility] [phone]   [phone]  PROGRESS NOTE  Patient Care Team: Leigh Lung, MD as PCP - General (Family Medicine) Federico Norleen ONEIDA MADISON, MD as Consulting Physician (Hematology and Oncology)  Hematological/Oncological History # CLL --diagnosis of chronic lymphoid leukemia made 11/04/2018             (a) the cells are positive for CD 04/12/19/22/23, kappa restricted   (1) Rituximab  weekly x 9 weeks beginning 11/23/2018             (a) reaction to first dose (which was 100 mg only).   (2) on maintenance rituximab , first dose 03/07/2019, repeated every 3 months             (a) baseline PET scan obtained on 02/16/2020 was Deauville 2 except for the left iliac nodes, with SUV max 3.1             (b) rituximab  decreased to every 6 months beginning October 2021   (3) Switched to Zanubrutinib  160 mg PO BID in July 2024.   (4) due to progression transitioned to Obinutuzumab  x 8 cycles.   Interval History:  Cody [name] is a 68 year old male with Chronic Lymphocytic Leukemia (CLL) who presents for a follow-up visit while on obinutuzumab . The last visit was on 07/08/2024. In the interim since the last visit he completed Obinutuzumab  therapy.   Cody [name] reports he has been well overall in the room since her last visit. His energy is overall stable and he continues to walk daily. He has decreased appetite with weight oscillating 6-7 pounds in the past 3 months. He will be supplementing his diet with protein shakes. He denies nausea, vomiting or bowel habit changes. He denies easy bruising or signs of bleeding. He denies fevers, chills, sweats, shortness of breath, chest pain or cough. He has no other complaints. Full 10 point ROS is otherwise negative.  MEDICAL HISTORY:  Past Medical History:  Diagnosis Date   Alcoholic cirrhosis (HCC)    Cataract    Chronic lymphoid leukemia (HCC)    Diabetes mellitus without complication (HCC)    High blood  pressure 10/24/2004   History of alcohol abuse    Hypercholesterolemia     SURGICAL HISTORY: No past surgical history on file.  SOCIAL HISTORY: Social History   Socioeconomic History   Marital status: Divorced    Spouse name: Not on file   Number of children: Not on file   Years of education: Not on file   Highest education level: Not on file  Occupational History   Not on file  Tobacco Use   Smoking status: Never   Smokeless tobacco: Never  Substance and Sexual Activity   Alcohol use: Not Currently   Drug use: No   Sexual activity: Not on file  Other Topics Concern   Not on file  Social History Narrative   Not on file   Social Drivers of Health   Financial Resource Strain: Not on file  Food Insecurity: No Food Insecurity (07/08/2024)   Hunger Vital Sign    Worried About Running Out of Food in the Last Year: Never true    Ran Out of Food in the Last Year: Never true  Transportation Needs: No Transportation Needs (07/08/2024)   PRAPARE - Administrator, Civil Service (Medical): No    Lack of Transportation (Non-Medical): No  Physical Activity: Not on file  Stress: Not on file  Social Connections: Not on  file  Intimate Partner Violence: Not At Risk (07/08/2024)   Humiliation, Afraid, Rape, and Kick questionnaire    Fear of Current or Ex-Partner: No    Emotionally Abused: No    Physically Abused: No    Sexually Abused: No    FAMILY HISTORY: Family History  Problem Relation Age of Onset   Alcohol abuse Father    Cirrhosis Father    Coronary artery disease Father     ALLERGIES:  is allergic to gazyva  [obinutuzumab ] and rituxan  [rituximab ].  MEDICATIONS:  Current Outpatient Medications  Medication Sig Dispense Refill   allopurinol  (ZYLOPRIM ) 300 MG tablet TAKE 1 TABLET DAILY (Patient taking differently: Take 300 mg by mouth in the morning.) 90 tablet 3   amLODipine  (NORVASC ) 10 MG tablet Take 10 mg by mouth at bedtime.     aspirin  EC 81 MG tablet  Take 1 tablet (81 mg total) by mouth daily. Swallow whole. 30 tablet 12   atorvastatin  (LIPITOR) 20 MG tablet Take 1 tablet (20 mg total) by mouth at bedtime. 30 tablet 3   B-D UF III MINI PEN NEEDLES 31G X 5 MM MISC      carvedilol  (COREG ) 6.25 MG tablet Take 1 tablet (6.25 mg total) by mouth 2 (two) times daily.     Continuous Glucose Sensor (FREESTYLE LIBRE 3 SENSOR) MISC Apply topically every other day.     dapagliflozin  propanediol (FARXIGA ) 10 MG TABS tablet Take 1 tablet (10 mg total) by mouth daily. 30 tablet 3   fexofenadine (ALLEGRA) 180 MG tablet Take 180 mg by mouth at bedtime.     hydrochlorothiazide (HYDRODIURIL) 25 MG tablet Take 25 mg by mouth daily.     losartan  (COZAAR ) 50 MG tablet Take 50 mg by mouth 2 (two) times daily.     metFORMIN  (GLUCOPHAGE -XR) 500 MG 24 hr tablet Take 1 tablet (500 mg total) by mouth 2 (two) times daily with a meal. Take 500 mg by mouth in the morning and 1,000 mg at bedtime (Patient taking differently: Take 500-1,000 mg by mouth See admin instructions. Take 500 mg by mouth in the morning and 1,000 mg at bedtime)     ondansetron  (ZOFRAN ) 8 MG tablet Take 1 tablet (8 mg total) by mouth every 8 (eight) hours as needed. 30 tablet 0   prochlorperazine  (COMPAZINE ) 10 MG tablet Take 1 tablet (10 mg total) by mouth every 6 (six) hours as needed for nausea or vomiting. 30 tablet 0   TOUJEO  SOLOSTAR 300 UNIT/ML SOPN Inject 0-8 Units into the skin See admin instructions. Inject 0-8 units into the skin in the morning and at bedtime, PER SLIDING SCALE: BGL: 0-99 = give nothing; 100-150 = 4 units; 151-200 = 6 units; 201-250 = 8 units     ACCU-CHEK GUIDE TEST test strip USE TO CHECK BLOOD SUGAR UP TO FOUR TIMES PER DAY. (Patient not taking: Reported on 09/07/2024)     Blood Glucose Monitoring Suppl (ACCU-CHEK GUIDE) w/Device KIT  (Patient not taking: Reported on 09/07/2024)     ONETOUCH DELICA LANCETS FINE MISC 3 (three) times daily.  (Patient not taking: Reported on  09/07/2024)  5   No current facility-administered medications for this visit.    REVIEW OF SYSTEMS:   Constitutional: ( - ) fevers, ( - )  chills , ( - ) night sweats Eyes: ( - ) blurriness of vision, ( - ) double vision, ( - ) watery eyes Ears, nose, mouth, throat, and face: ( - ) mucositis, ( - )  sore throat Respiratory: ( - ) cough, ( - ) dyspnea, ( - ) wheezes Cardiovascular: ( - ) palpitation, ( - ) chest discomfort, ( - ) lower extremity swelling Gastrointestinal:  ( - ) nausea, ( - ) heartburn, ( - ) change in bowel habits Skin: ( - ) abnormal skin rashes Lymphatics: ( - ) new lymphadenopathy, ( - ) easy bruising Neurological: ( - ) numbness, ( - ) tingling, ( - ) new weaknesses Behavioral/Psych: ( - ) mood change, ( - ) new changes  All other systems were reviewed with the patient and are negative.  PHYSICAL EXAMINATION: ECOG PERFORMANCE STATUS: 1 - Symptomatic but completely ambulatory  Vitals:   09/07/24 1017  BP: 123/66  Pulse: 61  Resp: 18  Temp: (!) 97.3 F (36.3 C)  SpO2: 100%       Filed Weights   09/07/24 1017  Weight: 204 lb 9.6 oz (92.8 kg)     GENERAL: Well-appearing elderly male, alert, no distress and comfortable SKIN: skin color, texture, turgor are normal, no rashes or significant lesions EYES: conjunctiva are pink and non-injected, sclera clear LUNGS: clear to auscultation and percussion with normal breathing effort HEART: regular rate & rhythm and no murmurs and no lower extremity edema Musculoskeletal: no cyanosis of digits and no clubbing  PSYCH: alert & oriented x 3, fluent speech NEURO: no focal motor/sensory deficits  LABORATORY DATA:  I have reviewed the data as listed    Latest Ref Rng & Units 09/07/2024    9:27 AM 07/08/2024   10:25 AM 06/10/2024   10:42 AM  CBC  WBC 4.0 - 10.5 K/uL 6.2  4.3  4.3   Hemoglobin 13.0 - 17.0 g/dL 87.1  86.6  86.5   Hematocrit 39.0 - 52.0 % 39.5  41.3  42.8   Platelets 150 - 400 K/uL 131  82  90         Latest Ref Rng & Units 09/07/2024    9:27 AM 07/08/2024   10:25 AM 06/10/2024   10:42 AM  CMP  Glucose 70 - 99 mg/dL 867  890  885   BUN 8 - 23 mg/dL 38  32  32   Creatinine 0.61 - 1.24 mg/dL 8.50  8.33  8.57   Sodium 135 - 145 mmol/L 142  139  139   Potassium 3.5 - 5.1 mmol/L 4.4  4.4  4.0   Chloride 98 - 111 mmol/L 108  104  103   CO2 22 - 32 mmol/L 27  27  31    Calcium  8.9 - 10.3 mg/dL 9.8  8.9  9.5   Total Protein 6.5 - 8.1 g/dL 6.8  6.9  7.1   Total Bilirubin 0.0 - 1.2 mg/dL 1.0  0.7  1.5   Alkaline Phos 38 - 126 U/L 84  81  79   AST 15 - 41 U/L 21  21  22    ALT 0 - 44 U/L 27  20  20      RADIOGRAPHIC STUDIES: No results found.   ASSESSMENT & PLAN Cody [name] is a 68 y.o. male with medical history significant for CLL who presents for a follow up visit.   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 Discontinued due to poor tolerance with medication.  --Completed 8 cycles of single agent Obinutuzumab  from 12/24/2023-07/08/2024.  PLAN --Labs show white blood cell 6.2, Hgb 12.8, MCV 83.9, Plt  131. Creatinine mildly elevated to 1.49. LFTs normal.  --Proceed with surveillance at this time.  --Return to clinic to reassess in 12 weeks time.   #General Health Maintenance -Encourage continued physical activity (treadmill walking).  #Thrombocytopenia: --No bleeding episodes reported at this time. -- Platelet count improved to 131 today, monitor for now   No orders of the defined types were placed in this encounter.   All questions were answered. The patient knows to call the clinic with any problems, questions or concerns.  I have spent a total of 30 minutes minutes of face-to-face and non-face-to-face time, preparing to see the patient, performing a medically appropriate examination, counseling and educating the patient, documenting  clinical information in the electronic health record,and care coordination  Johnston Police PA-C Dept of Hematology and Oncology [facility] Phone: [phone]  09/08/2024 11:14 AM  Shermon CHRISTELLA Hearing BD, Catovsky D, Caligaris-Cappio F, Dighiero G, Dhner H, Hillmen P, [place], Montserrat E, Chiorazzi N, [place], Rai KR, [place], [address], O'Brien S, Robak T, Seymour JF, Kipps TJ. iwCLL guidelines for diagnosis, indications for treatment, response assessment, and supportive management of CLL. Blood. 2018 Jun 21;131(25):2745-2760.  Active disease should be clearly documented to initiate therapy. At least 1 of the following criteria should be met.  1) Evidence of progressive marrow failure as manifested by the development of, or worsening of, anemia and/or thrombocytopenia. Cutoff levels of Hb <10 g/dL or platelet counts <899  109/L are generally regarded as indication for treatment. However, in some patients, platelet counts <100  109/L may remain stable over a long period; this situation does not automatically require therapeutic intervention. 2) Massive (ie, >=6 cm below the left costal margin) or progressive or symptomatic splenomegaly. 3) Massive nodes (ie, >=10 cm in longest diameter) or progressive or symptomatic lymphadenopathy. 4) Progressive lymphocytosis with an increase of >=50% over a 40-month period, or lymphocyte doubling time (LDT) <6 months. LDT can be obtained by linear regression extrapolation of absolute lymphocyte counts obtained at intervals of 2 weeks over an observation period of 2 to 3 months; patients with initial blood lymphocyte counts <30  109/L may require a longer observation period to determine the LDT. Factors contributing to lymphocytosis other than CLL (eg, infections, steroid administration) should be excluded. 5) Autoimmune complications including anemia or thrombocytopenia poorly responsive to corticosteroids. 6)  Symptomatic or functional extranodal involvement (eg, skin, kidney, lung, spine). Disease-related symptoms as defined by any of the following: Unintentional weight loss >=10% within the previous 6 months. Significant fatigue (ie, ECOG performance scale 2 or worse; cannot work or unable to perform usual activities). Fevers >=100.73F or 38.0C for 2 or more weeks without evidence of infection. Night sweats for >=1 month without evidence of infection.

## 2024-09-23 ENCOUNTER — Other Ambulatory Visit: Payer: Self-pay

## 2024-09-27 ENCOUNTER — Encounter: Payer: Self-pay | Admitting: Podiatry

## 2024-09-27 ENCOUNTER — Ambulatory Visit: Admitting: Podiatry

## 2024-09-27 DIAGNOSIS — M79674 Pain in right toe(s): Secondary | ICD-10-CM

## 2024-09-27 DIAGNOSIS — M79675 Pain in left toe(s): Secondary | ICD-10-CM

## 2024-09-27 DIAGNOSIS — E1151 Type 2 diabetes mellitus with diabetic peripheral angiopathy without gangrene: Secondary | ICD-10-CM

## 2024-09-27 DIAGNOSIS — B351 Tinea unguium: Secondary | ICD-10-CM | POA: Diagnosis not present

## 2024-09-27 NOTE — Progress Notes (Signed)
 This patient presents to the office with chief complaint of long thick painful nails.  Patient says the nails are painful walking and wearing shoes.  This patient is unable to self treat.  This patient is unable to trim his nails since he is unable to reach his  nails.   he presents to the office for preventative foot care services.    General Appearance  Alert, conversant and in no acute stress.  Vascular  Dorsalis pedis and posterior tibial  pulses are palpable  bilaterally.  Capillary return is within normal limits  bilaterally. Temperature is within normal limits  bilaterally.  Neurologic  Senn-Weinstein monofilament wire test within normal limits  bilaterally. Muscle power within normal limits bilaterally.  Nails Thick disfigured discolored nails with subungual debris  from hallux to fifth toes bilaterally. No evidence of bacterial infection or drainage bilaterally.   Orthopedic  No limitations of motion  feet .  No crepitus or effusions noted.  No bony pathology .  Syndactaly 2-3 left foot.  Skin  normotropic skin with  noted bilaterally.  No signs of infections or ulcers noted.   Porokeratosis sub 2 right foot.  Onychomycosis  Nails  B/L.  Pain in right toes  Pain in left toes   Debridement of nails both feet followed trimming the nails with dremel tool.  Debridement of callus with # 15 blade and dremel tool.    RTC  3  months.   Helane Gunther DPM

## 2024-09-28 ENCOUNTER — Other Ambulatory Visit: Payer: Self-pay

## 2024-10-31 DIAGNOSIS — E1122 Type 2 diabetes mellitus with diabetic chronic kidney disease: Secondary | ICD-10-CM | POA: Diagnosis not present

## 2024-11-29 DIAGNOSIS — C9110 Chronic lymphocytic leukemia of B-cell type not having achieved remission: Secondary | ICD-10-CM

## 2024-11-29 NOTE — Progress Notes (Unsigned)
 " [facility] [phone]   [phone]  PROGRESS NOTE  Patient Care Team: Leigh Lung, MD as PCP - General (Family Medicine) Cody Norleen ONEIDA MADISON, MD as Consulting Physician (Hematology and Oncology)  Hematological/Oncological History # CLL --diagnosis of chronic lymphoid leukemia made 11/04/2018             (a) the cells are positive for CD 04/12/19/22/23, kappa restricted   (1) Rituximab  weekly x 9 weeks beginning 11/23/2018             (a) reaction to first dose (which was 100 mg only).   (2) on maintenance rituximab , first dose 03/07/2019, repeated every 3 months             (a) baseline PET scan obtained on 02/16/2020 was Deauville 2 except for the left iliac nodes, with SUV max 3.1             (b) rituximab  decreased to every 6 months beginning October 2021   (3) Switched to Zanubrutinib  160 mg PO BID in July 2024.   (4) due to progression transitioned to Obinutuzumab  x 8 cycles.   Interval History:  [name] is a 69 year old male with Chronic Lymphocytic Leukemia (CLL) who presents for a follow-up visit while on obinutuzumab . The last visit was on 09/07/2024. In the interim since the last visit he completed Obinutuzumab  therapy.   [name] reports he has had no major changes in his health interim since our last visit.  He reports that he did get his flu and COVID shot this year and has not had any issues with runny nose, sore throat, cough.  He denies any fevers, chills, sweats.  He reports overall his energy and appetite are both strong.  He has lost about 11 pounds since August 2025 and 4 pounds since October.  He reports that he is now living with his sister and occasionally says he is not hungry when he simply does not prefer the food that is being cooked.  Otherwise he has been his baseline level of health with no bumps or lumps concerning for lymphadenopathy.  He denies any nausea, vomiting, or diarrhea.  Full 10 point ROS is  otherwise negative.  MEDICAL HISTORY:  Past Medical History:  Diagnosis Date   Alcoholic cirrhosis (HCC)    Cataract    Chronic lymphoid leukemia (HCC)    Diabetes mellitus without complication (HCC)    High blood pressure 10/24/2004   History of alcohol abuse    Hypercholesterolemia     SURGICAL HISTORY: No past surgical history on file.  SOCIAL HISTORY: Social History   Socioeconomic History   Marital status: Divorced    Spouse name: Not on file   Number of children: Not on file   Years of education: Not on file   Highest education level: Not on file  Occupational History   Not on file  Tobacco Use   Smoking status: Never   Smokeless tobacco: Never  Substance and Sexual Activity   Alcohol use: Not Currently   Drug use: No   Sexual activity: Not on file  Other Topics Concern   Not on file  Social History Narrative   Not on file   Social Drivers of Health   Tobacco Use: Low Risk (09/27/2024)   Patient History    Smoking Tobacco Use: Never    Smokeless Tobacco Use: Never    Passive Exposure: Not on file  Financial Resource Strain: Not on file  Food Insecurity: No Food Insecurity (07/08/2024)   Epic    Worried About Programme Researcher, Broadcasting/film/video in the Last Year: Never true    Ran Out of Food in the Last Year: Never true  Transportation Needs: No Transportation Needs (07/08/2024)   Epic    Lack of Transportation (Medical): No    Lack of Transportation (Non-Medical): No  Physical Activity: Not on file  Stress: Not on file  Social Connections: Not on file  Intimate Partner Violence: Not At Risk (07/08/2024)   Epic    Fear of Current or Ex-Partner: No    Emotionally Abused: No    Physically Abused: No    Sexually Abused: No  Depression (PHQ2-9): Low Risk (07/08/2024)   Depression (PHQ2-9)    PHQ-2 Score: 0  Alcohol Screen: Not on file  Housing: Low Risk (07/08/2024)   Epic    Unable to Pay for Housing in the Last Year: No    Number of Times Moved in the Last Year: 0     Homeless in the Last Year: No  Utilities: Not At Risk (07/08/2024)   Epic    Threatened with loss of utilities: No  Health Literacy: Not on file    FAMILY HISTORY: Family History  Problem Relation Age of Onset   Alcohol abuse Father    Cirrhosis Father    Coronary artery disease Father     ALLERGIES:  is allergic to gazyva  [obinutuzumab ] and rituxan  [rituximab ].  MEDICATIONS:  Current Outpatient Medications  Medication Sig Dispense Refill   allopurinol  (ZYLOPRIM ) 300 MG tablet TAKE 1 TABLET DAILY (Patient taking differently: Take 300 mg by mouth in the morning.) 90 tablet 3   amLODipine  (NORVASC ) 10 MG tablet Take 10 mg by mouth at bedtime.     aspirin  EC 81 MG tablet Take 1 tablet (81 mg total) by mouth daily. Swallow whole. 30 tablet 12   atorvastatin  (LIPITOR) 20 MG tablet Take 1 tablet (20 mg total) by mouth at bedtime. 30 tablet 3   B-D UF III MINI PEN NEEDLES 31G X 5 MM MISC      carvedilol  (COREG ) 6.25 MG tablet Take 1 tablet (6.25 mg total) by mouth 2 (two) times daily.     Continuous Glucose Sensor (FREESTYLE LIBRE 3 SENSOR) MISC Apply topically every other day.     dapagliflozin  propanediol (FARXIGA ) 10 MG TABS tablet Take 1 tablet (10 mg total) by mouth daily. 30 tablet 3   fexofenadine (ALLEGRA) 180 MG tablet Take 180 mg by mouth at bedtime.     hydrochlorothiazide (HYDRODIURIL) 25 MG tablet Take 25 mg by mouth daily.     losartan  (COZAAR ) 50 MG tablet Take 50 mg by mouth 2 (two) times daily.     metFORMIN  (GLUCOPHAGE -XR) 500 MG 24 hr tablet Take 1 tablet (500 mg total) by mouth 2 (two) times daily with a meal. Take 500 mg by mouth in the morning and 1,000 mg at bedtime (Patient taking differently: Take 500-1,000 mg by mouth See admin instructions. Take 500 mg by mouth in the morning and 1,000 mg at bedtime)     ondansetron  (ZOFRAN ) 8 MG tablet Take 1 tablet (8 mg total) by mouth every 8 (eight) hours as needed. 30 tablet 0   prochlorperazine  (COMPAZINE ) 10 MG tablet  Take 1 tablet (10 mg total) by mouth every 6 (six) hours as needed for nausea or vomiting. 30 tablet 0   TOUJEO  SOLOSTAR 300 UNIT/ML SOPN Inject 0-8 Units into the skin See admin instructions.  Inject 0-8 units into the skin in the morning and at bedtime, PER SLIDING SCALE: BGL: 0-99 = give nothing; 100-150 = 4 units; 151-200 = 6 units; 201-250 = 8 units     No current facility-administered medications for this visit.    REVIEW OF SYSTEMS:   Constitutional: ( - ) fevers, ( - )  chills , ( - ) night sweats Eyes: ( - ) blurriness of vision, ( - ) double vision, ( - ) watery eyes Ears, nose, mouth, throat, and face: ( - ) mucositis, ( - ) sore throat Respiratory: ( - ) cough, ( - ) dyspnea, ( - ) wheezes Cardiovascular: ( - ) palpitation, ( - ) chest discomfort, ( - ) lower extremity swelling Gastrointestinal:  ( - ) nausea, ( - ) heartburn, ( - ) change in bowel habits Skin: ( - ) abnormal skin rashes Lymphatics: ( - ) new lymphadenopathy, ( - ) easy bruising Neurological: ( - ) numbness, ( - ) tingling, ( - ) new weaknesses Behavioral/Psych: ( - ) mood change, ( - ) new changes  All other systems were reviewed with the patient and are negative.  PHYSICAL EXAMINATION: ECOG PERFORMANCE STATUS: 1 - Symptomatic but completely ambulatory  Vitals:   11/30/24 1018  BP: 128/71  Pulse: (!) 55  Resp: 17  Temp: (!) 97 F (36.1 C)  SpO2: 100%   Filed Weights   11/30/24 1018  Weight: 200 lb 4.8 oz (90.9 kg)    GENERAL: Well-appearing elderly male, alert, no distress and comfortable SKIN: skin color, texture, turgor are normal, no rashes or significant lesions EYES: conjunctiva are pink and non-injected, sclera clear LUNGS: clear to auscultation and percussion with normal breathing effort HEART: regular rate & rhythm and no murmurs and no lower extremity edema Musculoskeletal: no cyanosis of digits and no clubbing  PSYCH: alert & oriented x 3, fluent speech NEURO: no focal motor/sensory  deficits  LABORATORY DATA:  I have reviewed the data as listed    Latest Ref Rng & Units 11/30/2024    9:51 AM 09/07/2024    9:27 AM 07/08/2024   10:25 AM  CBC  WBC 4.0 - 10.5 K/uL 7.3  6.2  4.3   Hemoglobin 13.0 - 17.0 g/dL 87.4  87.1  86.6   Hematocrit 39.0 - 52.0 % 38.4  39.5  41.3   Platelets 150 - 400 K/uL 108  131  82        Latest Ref Rng & Units 11/30/2024    9:51 AM 09/07/2024    9:27 AM 07/08/2024   10:25 AM  CMP  Glucose 70 - 99 mg/dL 883  867  890   BUN 8 - 23 mg/dL 26  38  32   Creatinine 0.61 - 1.24 mg/dL 8.60  8.50  8.33   Sodium 135 - 145 mmol/L 140  142  139   Potassium 3.5 - 5.1 mmol/L 4.2  4.4  4.4   Chloride 98 - 111 mmol/L 105  108  104   CO2 22 - 32 mmol/L 25  27  27    Calcium  8.9 - 10.3 mg/dL 9.3  9.8  8.9   Total Protein 6.5 - 8.1 g/dL 7.2  6.8  6.9   Total Bilirubin 0.0 - 1.2 mg/dL 0.7  1.0  0.7   Alkaline Phos 38 - 126 U/L 94  84  81   AST 15 - 41 U/L 23  21  21    ALT 0 - 44  U/L 20  27  20      RADIOGRAPHIC STUDIES: No results found.   ASSESSMENT & PLAN Cody [name] is a 69 y.o. male with medical history significant for CLL who presents for a follow up visit.   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 Discontinued due to poor tolerance with medication.  --Completed 8 cycles of single agent Obinutuzumab  from 12/24/2023-07/08/2024.  PLAN --Labs show white blood cell 7.3, Hgb 12.5, MCV 87.9, Plt 108. LFTs normal.  --Proceed with surveillance at this time.  --Return to clinic to reassess in 12 weeks time.   #General Health Maintenance -Encourage continued physical activity (treadmill walking).  #Thrombocytopenia: --No bleeding episodes reported at this time. -- Platelet count improved to 108 today, monitor for now   No orders of the defined types were placed in this encounter.   All questions were answered.  The patient knows to call the clinic with any problems, questions or concerns.  I have spent a total of 30 minutes minutes of face-to-face and non-face-to-face time, preparing to see the patient, performing a medically appropriate examination, counseling and educating the patient, documenting clinical information in the electronic health record,and care coordination  Norleen IVAR Kidney, MD Department of Hematology/Oncology [facility] Phone: [phone] Pager: [phone] Email: [email or web address]   11/30/2024 10:32 AM  Cody [name] CHRISTELLA Hearing BD, Catovsky D, Caligaris-Cappio F, Dighiero G, Dhner H, Hillmen P, Keating M, Montserrat E, Chiorazzi N, Stilgenbauer S, Rai KR, [place], Eichhorst B, O'Brien S, Robak T, Seymour JF, Kipps TJ. iwCLL guidelines for diagnosis, indications for treatment, response assessment, and supportive management of CLL. Blood. 2018 Jun 21;131(25):2745-2760.  Active disease should be clearly documented to initiate therapy. At least 1 of the following criteria should be met.  1) Evidence of progressive marrow failure as manifested by the development of, or worsening of, anemia and/or thrombocytopenia. Cutoff levels of Hb <10 g/dL or platelet counts <899  109/L are generally regarded as indication for treatment. However, in some patients, platelet counts <100  109/L may remain stable over a long period; this situation does not automatically require therapeutic intervention. 2) Massive (ie, >=6 cm below the left costal margin) or progressive or symptomatic splenomegaly. 3) Massive nodes (ie, >=10 cm in longest diameter) or progressive or symptomatic lymphadenopathy. 4) Progressive lymphocytosis with an increase of >=50% over a 95-month period, or lymphocyte doubling time (LDT) <6 months. LDT can be obtained by linear regression extrapolation of absolute lymphocyte counts obtained at intervals of 2 weeks over an observation period of 2 to  3 months; patients with initial blood lymphocyte counts <30  109/L may require a longer observation period to determine the LDT. Factors contributing to lymphocytosis other than CLL (eg, infections, steroid administration) should be excluded. 5) Autoimmune complications including anemia or thrombocytopenia poorly responsive to corticosteroids. 6) Symptomatic or functional extranodal involvement (eg, skin, kidney, lung, spine). Disease-related symptoms as defined by any of the following: Unintentional weight loss >=10% within the previous 6 months. Significant fatigue (ie, ECOG performance scale 2 or worse; cannot work or unable to perform usual activities). Fevers >=100.73F or 38.0C for 2 or more weeks without evidence of infection. Night sweats for >=1 month without evidence of infection. "

## 2024-11-30 ENCOUNTER — Inpatient Hospital Stay: Attending: Hematology and Oncology

## 2024-11-30 ENCOUNTER — Inpatient Hospital Stay: Admitting: Hematology and Oncology

## 2024-11-30 ENCOUNTER — Other Ambulatory Visit: Payer: Self-pay | Admitting: Hematology and Oncology

## 2024-11-30 VITALS — BP 128/71 | HR 55 | Temp 97.0°F | Resp 17 | Ht 72.0 in | Wt 200.3 lb

## 2024-11-30 DIAGNOSIS — Z79899 Other long term (current) drug therapy: Secondary | ICD-10-CM | POA: Insufficient documentation

## 2024-11-30 DIAGNOSIS — C9110 Chronic lymphocytic leukemia of B-cell type not having achieved remission: Secondary | ICD-10-CM

## 2024-11-30 DIAGNOSIS — D696 Thrombocytopenia, unspecified: Secondary | ICD-10-CM

## 2024-11-30 LAB — CMP (CANCER CENTER ONLY)
ALT: 20 U/L (ref 0–44)
AST: 23 U/L (ref 15–41)
Albumin: 4.7 g/dL (ref 3.5–5.0)
Alkaline Phosphatase: 94 U/L (ref 38–126)
Anion gap: 10 (ref 5–15)
BUN: 26 mg/dL — ABNORMAL HIGH (ref 8–23)
CO2: 25 mmol/L (ref 22–32)
Calcium: 9.3 mg/dL (ref 8.9–10.3)
Chloride: 105 mmol/L (ref 98–111)
Creatinine: 1.39 mg/dL — ABNORMAL HIGH (ref 0.61–1.24)
GFR, Estimated: 55 mL/min — ABNORMAL LOW
Glucose, Bld: 116 mg/dL — ABNORMAL HIGH (ref 70–99)
Potassium: 4.2 mmol/L (ref 3.5–5.1)
Sodium: 140 mmol/L (ref 135–145)
Total Bilirubin: 0.7 mg/dL (ref 0.0–1.2)
Total Protein: 7.2 g/dL (ref 6.5–8.1)

## 2024-11-30 LAB — LACTATE DEHYDROGENASE: LDH: 127 U/L (ref 105–235)

## 2024-11-30 LAB — CBC WITH DIFFERENTIAL (CANCER CENTER ONLY)
Abs Immature Granulocytes: 0.04 K/uL (ref 0.00–0.07)
Basophils Absolute: 0 K/uL (ref 0.0–0.1)
Basophils Relative: 0 %
Eosinophils Absolute: 0.3 K/uL (ref 0.0–0.5)
Eosinophils Relative: 4 %
HCT: 38.4 % — ABNORMAL LOW (ref 39.0–52.0)
Hemoglobin: 12.5 g/dL — ABNORMAL LOW (ref 13.0–17.0)
Immature Granulocytes: 1 %
Lymphocytes Relative: 48 %
Lymphs Abs: 3.5 K/uL (ref 0.7–4.0)
MCH: 28.6 pg (ref 26.0–34.0)
MCHC: 32.6 g/dL (ref 30.0–36.0)
MCV: 87.9 fL (ref 80.0–100.0)
Monocytes Absolute: 0.5 K/uL (ref 0.1–1.0)
Monocytes Relative: 6 %
Neutro Abs: 3 K/uL (ref 1.7–7.7)
Neutrophils Relative %: 41 %
Platelet Count: 108 K/uL — ABNORMAL LOW (ref 150–400)
RBC: 4.37 MIL/uL (ref 4.22–5.81)
RDW: 14.2 % (ref 11.5–15.5)
WBC Count: 7.3 K/uL (ref 4.0–10.5)
nRBC: 0 % (ref 0.0–0.2)

## 2024-12-01 ENCOUNTER — Other Ambulatory Visit: Payer: Self-pay

## 2024-12-28 ENCOUNTER — Ambulatory Visit: Admitting: Podiatry

## 2024-12-30 ENCOUNTER — Telehealth: Payer: Self-pay

## 2024-12-30 DIAGNOSIS — Z7189 Other specified counseling: Secondary | ICD-10-CM

## 2024-12-30 NOTE — Progress Notes (Signed)
 Complex Care Management Note  Care Guide Note 12/30/2024 Name: Cody [name] MRN: [id number] DOB: Oct 29, 1956  Cody [name] is a 69 y.o. year old male who sees Leigh Lung, MD for primary care. I reached out to [name] by phone today to offer complex care management services.  [name] was given information about Complex Care Management services today including:   The Complex Care Management services include support from the care team which includes your Nurse Care Manager, Clinical Social Worker, or Pharmacist.  The Complex Care Management team is here to help remove barriers to the health concerns and goals most important to you. Complex Care Management services are voluntary, and the patient may decline or stop services at any time by request to their care team member.   Complex Care Management Consent Status: Patient did not agree to participate in complex care management services at this time.  Follow up plan:    Encounter Outcome:  Patient Refused  [name] , RMA     [facility]  [facility], [facility] Guide  Direct Dial: [phone]  Website: [email or web address]

## 2025-01-18 ENCOUNTER — Ambulatory Visit: Admitting: Podiatry

## 2025-03-02 ENCOUNTER — Inpatient Hospital Stay: Admitting: Hematology and Oncology

## 2025-03-02 ENCOUNTER — Inpatient Hospital Stay

## 2025-05-31 ENCOUNTER — Inpatient Hospital Stay: Admitting: Hematology and Oncology

## 2025-05-31 ENCOUNTER — Inpatient Hospital Stay
